# Patient Record
Sex: Male | Born: 1940 | Race: White | Hispanic: No | Smoking: Current every day smoker
Health system: Southern US, Community
[De-identification: ages and names within clinical notes are randomized; demographics above are authoritative.]

## PROBLEM LIST (undated history)

## (undated) ENCOUNTER — Emergency Department: Discharge status: Absent without leave | Payer: Medicare Other

## (undated) DIAGNOSIS — Z9109 Other allergy status, other than to drugs and biological substances: Secondary | ICD-10-CM

## (undated) DIAGNOSIS — F32A Depression, unspecified: Secondary | ICD-10-CM

## (undated) DIAGNOSIS — F329 Major depressive disorder, single episode, unspecified: Secondary | ICD-10-CM

## (undated) DIAGNOSIS — M549 Dorsalgia, unspecified: Secondary | ICD-10-CM

## (undated) DIAGNOSIS — I509 Heart failure, unspecified: Secondary | ICD-10-CM

## (undated) DIAGNOSIS — Z87891 Personal history of nicotine dependence: Secondary | ICD-10-CM

## (undated) DIAGNOSIS — F4310 Post-traumatic stress disorder, unspecified: Secondary | ICD-10-CM

## (undated) DIAGNOSIS — J439 Emphysema, unspecified: Secondary | ICD-10-CM

## (undated) DIAGNOSIS — I2699 Other pulmonary embolism without acute cor pulmonale: Secondary | ICD-10-CM

## (undated) DIAGNOSIS — I10 Essential (primary) hypertension: Secondary | ICD-10-CM

## (undated) DIAGNOSIS — F0390 Unspecified dementia without behavioral disturbance: Secondary | ICD-10-CM

## (undated) DIAGNOSIS — G8929 Other chronic pain: Secondary | ICD-10-CM

## (undated) HISTORY — DX: Personal history of nicotine dependence: Z87.891

## (undated) HISTORY — DX: Depression, unspecified: F32.A

## (undated) HISTORY — DX: Emphysema, unspecified: J43.9

## (undated) HISTORY — DX: Other allergy status, other than to drugs and biological substances: Z91.09

## (undated) HISTORY — DX: Major depressive disorder, single episode, unspecified: F32.9

## (undated) HISTORY — DX: Post-traumatic stress disorder, unspecified: F43.10

## (undated) HISTORY — PX: BACK SURGERY: SHX140

---

## 1978-12-12 HISTORY — PX: TONSILLECTOMY AND ADENOIDECTOMY: SHX28

## 2012-04-10 ENCOUNTER — Emergency Department: Payer: Self-pay | Admitting: Emergency Medicine

## 2012-04-10 LAB — CBC
HGB: 16.3 g/dL (ref 13.0–18.0)
MCH: 30.5 pg (ref 26.0–34.0)
MCHC: 33.8 g/dL (ref 32.0–36.0)
MCV: 90 fL (ref 80–100)
Platelet: 158 10*3/uL (ref 150–440)
RBC: 5.34 10*6/uL (ref 4.40–5.90)
WBC: 8.5 10*3/uL (ref 3.8–10.6)

## 2012-04-10 LAB — COMPREHENSIVE METABOLIC PANEL
Alkaline Phosphatase: 78 U/L (ref 50–136)
Bilirubin,Total: 0.7 mg/dL (ref 0.2–1.0)
Calcium, Total: 8.7 mg/dL (ref 8.5–10.1)
Chloride: 102 mmol/L (ref 98–107)
Co2: 24 mmol/L (ref 21–32)
EGFR (African American): >60
Osmolality: 271 (ref 275–301)
Potassium: 3.7 mmol/L (ref 3.5–5.1)
SGPT (ALT): 16 U/L
Sodium: 136 mmol/L (ref 136–145)

## 2012-04-10 LAB — TROPONIN I: Troponin-I: <0.02 ng/mL

## 2015-08-06 ENCOUNTER — Other Ambulatory Visit: Payer: Self-pay | Admitting: Family Medicine

## 2015-08-06 ENCOUNTER — Encounter: Payer: Self-pay | Admitting: Family Medicine

## 2015-08-06 DIAGNOSIS — Z87891 Personal history of nicotine dependence: Secondary | ICD-10-CM | POA: Insufficient documentation

## 2015-08-06 HISTORY — DX: Personal history of nicotine dependence: Z87.891

## 2015-08-07 ENCOUNTER — Inpatient Hospital Stay: Payer: Medicare Other | Attending: Family Medicine | Admitting: Family Medicine

## 2015-08-07 ENCOUNTER — Ambulatory Visit
Admission: RE | Admit: 2015-08-07 | Discharge: 2015-08-07 | Disposition: A | Discharge status: Home / Self Care | Payer: Medicare Other | Source: Ambulatory Visit | Attending: Family Medicine | Admitting: Family Medicine

## 2015-08-07 ENCOUNTER — Encounter: Payer: Self-pay | Admitting: Family Medicine

## 2015-08-07 DIAGNOSIS — F17210 Nicotine dependence, cigarettes, uncomplicated: Secondary | ICD-10-CM

## 2015-08-07 DIAGNOSIS — K760 Fatty (change of) liver, not elsewhere classified: Secondary | ICD-10-CM | POA: Insufficient documentation

## 2015-08-07 DIAGNOSIS — J439 Emphysema, unspecified: Secondary | ICD-10-CM | POA: Insufficient documentation

## 2015-08-07 DIAGNOSIS — Z87891 Personal history of nicotine dependence: Secondary | ICD-10-CM

## 2015-08-07 DIAGNOSIS — I2510 Atherosclerotic heart disease of native coronary artery without angina pectoris: Secondary | ICD-10-CM | POA: Insufficient documentation

## 2015-08-07 DIAGNOSIS — Z122 Encounter for screening for malignant neoplasm of respiratory organs: Secondary | ICD-10-CM

## 2015-08-07 NOTE — Progress Notes (Signed)
In accordance with CMS guidelines, patient has meet eligibility criteria including age, absence of signs or symptoms of lung cancer, the specific calculation of cigarette smoking pack-years was 55 years and is a current smoker.   A shared decision-making session was conducted prior to the performance of CT scan. This includes one or more decision aids, includes benefits and harms of screening, follow-up diagnostic testing, over-diagnosis, false positive rate, and total radiation exposure.  Counseling on the importance of adherence to annual lung cancer LDCT screening, impact of co-morbidities, and ability or willingness to undergo diagnosis and treatment is imperative for compliance of the program.  Counseling on the importance of continued smoking cessation for former smokers; the importance of smoking cessation for current smokers and information about tobacco cessation interventions have been given to patient including the [place] [facility], [address] quit [place], as well as Cancer Center specific smoking cessation programs.  Written order for lung cancer screening with LDCT has been given to the patient and any and all questions have been answered to the best of my abilities.   Yearly follow up will be scheduled by Shawn Perkins, Thoracic Navigator.   

## 2015-08-10 ENCOUNTER — Telehealth: Payer: Self-pay | Admitting: *Deleted

## 2015-08-10 NOTE — Telephone Encounter (Signed)
  Oncology Nurse Navigator Documentation    Navigator Encounter Type: Telephone;Screening (08/10/15 1100)                      Time Spent with Patient: 15 (08/10/15 1100)   Notified patient of LDCT lung cancer screening results of Lung Rads 2S finding with recommendation for 12 month follow up imaging. Also notified of incidental finding noted below. Patient verbalizes understanding. Copy of results will be sent to the referring provider as well as patient's primary care provider.  1. Lung-RADS Category 2S, benign appearance or behavior. Continue annual screening with low-dose chest CT without contrast in 12 months. 2. The "S" modifier above refers to potentially clinically significant non lung cancer related findings. Specifically, there is extensive atherosclerosis, including three-vessel coronary artery disease. Assessment for potential risk factor modification, dietary therapy or pharmacologic therapy may be warranted, if clinically indicated. 3. Mild diffuse bronchial wall thickening with moderate centrilobular and mild paraseptal emphysema; imaging findings suggestive of underlying COPD. 4. Mild hepatic steatosis.

## 2016-04-01 ENCOUNTER — Encounter: Payer: Self-pay | Admitting: Primary Care

## 2016-04-01 ENCOUNTER — Ambulatory Visit (INDEPENDENT_AMBULATORY_CARE_PROVIDER_SITE_OTHER): Payer: Medicare Other | Admitting: Primary Care

## 2016-04-01 VITALS — BP 134/84 | HR 65 | Temp 97.3°F | Ht 69.5 in | Wt 217.1 lb

## 2016-04-01 DIAGNOSIS — G2581 Restless legs syndrome: Secondary | ICD-10-CM

## 2016-04-01 DIAGNOSIS — N400 Enlarged prostate without lower urinary tract symptoms: Secondary | ICD-10-CM | POA: Diagnosis not present

## 2016-04-01 DIAGNOSIS — J449 Chronic obstructive pulmonary disease, unspecified: Secondary | ICD-10-CM

## 2016-04-01 MED ORDER — PRAMIPEXOLE DIHYDROCHLORIDE 1 MG PO TABS: 1.0000 mg | ORAL_TABLET | Freq: Every day | ORAL | Status: DC

## 2016-04-01 NOTE — Patient Instructions (Signed)
I've sent refills of your Mirapex to your pharmacy.  Ensure you are taking your Advair inhaler once everyday. This will help with your shortness of breath.   Please schedule a physical with me within the next 3 months. You may also schedule a lab only appointment 3-4 days prior. We will discuss your lab results in detail during your physical.  It was a pleasure to meet you today! Please don't hesitate to call me with any questions. Welcome to [facility]!

## 2016-04-01 NOTE — Progress Notes (Signed)
Subjective:    Patient ID: Ricardo Erickson, male    DOB: 1941/09/12, 75 y.o.   MRN: [id number]  HPI  Ricardo Erickson is a 75 year old male who presents today to establish care and discuss the problems mentioned below. Will obtain old records. His last physical was several years ago. Last had blood work was 5 months ago by prior PCP.   1) Restless Leg Syndrome: Diagnosed in childhood. Currently managed on Mirapex and feels well managed on this medication. He is requesting refills today as he is nearly out.   2) BPH: Long history of and is currently managed by Urology at [facility]. Currently managed on Avodart, Cialis, and Flomax. History of elevated PSA by prior Urologist in [place]. History of prostate cancer in his brother.   3) COPD: Current smoker and has smoked since age 51. He currently smokes 1.5 PPD. He completed a low dose CT of his chest in August 2016 which was unremarkable. Currently managed on Advair 250-50 and albuterol PRN. He is currently managed by pulmonology but does not wish to follow any longer. He is non compliant to his Advair as he's using it as needed. He does experience daily exertional shortness of breath.  Review of Systems  Constitutional: Negative for unexpected weight change.  Respiratory:       Morning cough. Chronic SOB upon exertion.   Cardiovascular: Negative for chest pain.  Genitourinary: Negative for difficulty urinating.  Neurological: Negative for headaches.       Occasional dizziness with rapid changes in positions       Past Medical History  Diagnosis Date  . Personal history of tobacco use, presenting hazards to health 08/06/2015  . Pollen allergies   . Depression   . Emphysema lung ([place])      Social History   Social History  . Marital Status: Widowed    Spouse Name: N/A  . Number of Children: N/A  . Years of Education: N/A   Occupational History  . Not on file.   Social History Main Topics  . Smoking status: Current  Every Day Smoker  . Smokeless tobacco: Not on file  . Alcohol Use: Not on file  . Drug Use: Not on file  . Sexual Activity: Not on file   Other Topics Concern  . Not on file   Social History Narrative   Widower.   2 children, 3 grandchildren, 1 great grandchild.   Retired. Now works as a Gaffer.   Enjoys relaxing, spending time with family, watching basketball.       Past Surgical History  Procedure Laterality Date  . Tonsillectomy and adenoidectomy  1980    Family History  Problem Relation Age of Onset  . Alcohol abuse Father   . Arthritis Mother   . Heart disease Maternal Grandmother   . Prostate cancer Brother     No Known Allergies  No current outpatient prescriptions on file prior to visit.   No current facility-administered medications on file prior to visit.    BP 134/84 mmHg  Pulse 65  Temp(Src) 97.3 F (36.3 C) (Oral)  Ht 5' 9.5" (1.765 m)  Wt 217 lb 1.9 oz (98.485 kg)  BMI 31.61 kg/m2  SpO2 93%    Objective:   Physical Exam  Constitutional: He is oriented to person, place, and time. He appears well-nourished.  Neck: Neck supple.  Cardiovascular: Normal rate and regular rhythm.   Pulmonary/Chest: Effort normal and breath sounds normal. He has no wheezes.  He has no rales.  Neurological: He is alert and oriented to person, place, and time.  Skin: Skin is warm and dry.  Psychiatric: He has a normal mood and affect.          Assessment & Plan:

## 2016-04-03 DIAGNOSIS — J449 Chronic obstructive pulmonary disease, unspecified: Secondary | ICD-10-CM | POA: Insufficient documentation

## 2016-04-03 DIAGNOSIS — G2581 Restless legs syndrome: Secondary | ICD-10-CM | POA: Insufficient documentation

## 2016-04-03 DIAGNOSIS — N400 Enlarged prostate without lower urinary tract symptoms: Secondary | ICD-10-CM | POA: Insufficient documentation

## 2016-04-03 NOTE — Assessment & Plan Note (Signed)
Long history of tobacco abuse. Low dose CT scan with findings of small nodules and COPD in August 2016. Repeat in August 2017. Discussed importance of daily use of Advair and PRN use of albuterol. Will continue to monitor.

## 2016-04-03 NOTE — Assessment & Plan Note (Signed)
Long history of since childhood. Refill provided on Mirapex for which he takes once daily.

## 2016-04-03 NOTE — Assessment & Plan Note (Signed)
Managed by Urology in [place]. History of elevated PSA which is monitored.  Continue Cialis, Avodart, Flomax.

## 2016-04-12 ENCOUNTER — Other Ambulatory Visit: Payer: Self-pay | Admitting: Primary Care

## 2016-04-12 DIAGNOSIS — Z13220 Encounter for screening for lipoid disorders: Secondary | ICD-10-CM

## 2016-04-12 DIAGNOSIS — Z131 Encounter for screening for diabetes mellitus: Secondary | ICD-10-CM

## 2016-04-12 DIAGNOSIS — N400 Enlarged prostate without lower urinary tract symptoms: Secondary | ICD-10-CM

## 2016-04-13 ENCOUNTER — Other Ambulatory Visit (INDEPENDENT_AMBULATORY_CARE_PROVIDER_SITE_OTHER): Payer: Medicare Other

## 2016-04-13 ENCOUNTER — Ambulatory Visit (INDEPENDENT_AMBULATORY_CARE_PROVIDER_SITE_OTHER): Payer: Medicare Other

## 2016-04-13 VITALS — BP 128/76 | HR 61 | Temp 97.5°F | Ht 69.25 in | Wt 215.0 lb

## 2016-04-13 DIAGNOSIS — Z0000 Encounter for general adult medical examination without abnormal findings: Secondary | ICD-10-CM

## 2016-04-13 DIAGNOSIS — Z125 Encounter for screening for malignant neoplasm of prostate: Secondary | ICD-10-CM

## 2016-04-13 DIAGNOSIS — N400 Enlarged prostate without lower urinary tract symptoms: Secondary | ICD-10-CM | POA: Diagnosis not present

## 2016-04-13 DIAGNOSIS — Z13220 Encounter for screening for lipoid disorders: Secondary | ICD-10-CM

## 2016-04-13 DIAGNOSIS — J449 Chronic obstructive pulmonary disease, unspecified: Secondary | ICD-10-CM

## 2016-04-13 DIAGNOSIS — Z131 Encounter for screening for diabetes mellitus: Secondary | ICD-10-CM

## 2016-04-13 LAB — LIPID PANEL
CHOLESTEROL: 188 mg/dL (ref 0–200)
HDL: 41.5 mg/dL (ref >39.00)
LDL Cholesterol: 130 mg/dL — ABNORMAL HIGH (ref 0–99)
NonHDL: 146.99
TRIGLYCERIDES: 83 mg/dL (ref 0.0–149.0)
Total CHOL/HDL Ratio: 5
VLDL: 16.6 mg/dL (ref 0.0–40.0)

## 2016-04-13 LAB — COMPREHENSIVE METABOLIC PANEL
ALBUMIN: 4.1 g/dL (ref 3.5–5.2)
ALK PHOS: 90 U/L (ref 39–117)
ALT: 9 U/L (ref 0–53)
AST: 19 U/L (ref 0–37)
BILIRUBIN TOTAL: 0.8 mg/dL (ref 0.2–1.2)
BUN: 13 mg/dL (ref 6–23)
CALCIUM: 9.2 mg/dL (ref 8.4–10.5)
CO2: 26 mEq/L (ref 19–32)
Chloride: 103 mEq/L (ref 96–112)
Creatinine, Ser: 0.95 mg/dL (ref 0.40–1.50)
GFR: 82.58 mL/min (ref >60.00)
Glucose, Bld: 89 mg/dL (ref 70–99)
Potassium: 4.3 mEq/L (ref 3.5–5.1)
Sodium: 140 mEq/L (ref 135–145)
TOTAL PROTEIN: 6.4 g/dL (ref 6.0–8.3)

## 2016-04-13 LAB — CBC WITH DIFFERENTIAL/PLATELET
BASOS ABS: 0.1 10*3/uL (ref 0.0–0.1)
Basophils Relative: 0.4 % (ref 0.0–3.0)
Eosinophils Absolute: 0.2 10*3/uL (ref 0.0–0.7)
Eosinophils Relative: 1.4 % (ref 0.0–5.0)
HEMATOCRIT: 52.7 % — AB (ref 39.0–52.0)
Hemoglobin: 17.6 g/dL — ABNORMAL HIGH (ref 13.0–17.0)
LYMPHS PCT: 19.4 % (ref 12.0–46.0)
Lymphs Abs: 2.5 10*3/uL (ref 0.7–4.0)
MCHC: 33.4 g/dL (ref 30.0–36.0)
MCV: 89.2 fl (ref 78.0–100.0)
MONOS PCT: 10.4 % (ref 3.0–12.0)
Monocytes Absolute: 1.3 10*3/uL — ABNORMAL HIGH (ref 0.1–1.0)
NEUTROS ABS: 8.8 10*3/uL — AB (ref 1.4–7.7)
Neutrophils Relative %: 68.4 % (ref 43.0–77.0)
PLATELETS: 233 10*3/uL (ref 150.0–400.0)
RBC: 5.91 Mil/uL — AB (ref 4.22–5.81)
RDW: 14.9 % (ref 11.5–15.5)
WBC: 12.9 10*3/uL — ABNORMAL HIGH (ref 4.0–10.5)

## 2016-04-13 LAB — HEMOGLOBIN A1C: HEMOGLOBIN A1C: 6.1 % (ref 4.6–6.5)

## 2016-04-13 LAB — PSA, MEDICARE: PSA: 11.99 ng/ml — ABNORMAL HIGH (ref 0.10–4.00)

## 2016-04-13 NOTE — Progress Notes (Signed)
I reviewed health advisor's note, was available for consultation, and agree with documentation and plan.  

## 2016-04-13 NOTE — Patient Instructions (Addendum)
Ricardo Erickson,   Per our conversation, please do the following:  A. Bring in copy of immunization record to next appt.   B. Contact insurance to determine coverage for both shingles and tetanus vaccines.   C. Bring a copy of advance directives to next appt. We can make a copy here, if necessary.   Thank you for taking time to come for your Medicare Wellness Visit. I appreciate your ongoing commitment to your health goals. Please review the following plan we discussed and let me know if I can assist you in the future.   These are the goals we discussed: Goals    . Weight < 141 lb (63.957 kg)     Target weight is 140 lbs. Starting 04/13/2016, I will continue to reduce intake of simple carbohydrates to no more than 1 serving daily.        This is a list of the screening recommended for you and due dates:  Health Maintenance  Topic Date Due  . Pneumonia vaccines (1 of 2 - PCV13) 09/11/2016*  . Colon Cancer Screening  04/13/2017*  . Shingles Vaccine  04/13/2017*  . Tetanus Vaccine  04/13/2017*  . Flu Shot  07/12/2016  *Topic was postponed. The date shown is not the original due date.    Preventive Care for Adults  A healthy lifestyle and preventive care can promote health and wellness. Preventive health guidelines for adults include the following key practices.  . A routine yearly physical is a good way to check with your health care provider about your health and preventive screening. It is a chance to share any concerns and updates on your health and to receive a thorough exam.  . Visit your dentist for a routine exam and preventive care every 6 months. Brush your teeth twice a day and floss once a day. Good oral hygiene prevents tooth decay and gum disease.  . The frequency of eye exams is based on your age, health, family medical history, use  of contact lenses, and other factors. Follow your health care provider's ecommendations for frequency of eye exams.  . Eat a healthy diet.  Foods like vegetables, fruits, whole grains, low-fat dairy products, and lean protein foods contain the nutrients you need without too many calories. Decrease your intake of foods high in solid fats, added sugars, and salt. Eat the right amount of calories for you. Get information about a proper diet from your health care provider, if necessary.  . Regular physical exercise is one of the most important things you can do for your health. Most adults should get at least 150 minutes of moderate-intensity exercise (any activity that increases your heart rate and causes you to sweat) each week. In addition, most adults need muscle-strengthening exercises on 2 or more days a week.  Silver Sneakers may be a benefit available to you. To determine eligibility, you may visit the website: www.silversneakers.com or contact program at (617)560-7549 Mon-Fri between 8AM-8PM.   . Maintain a healthy weight. The body mass index (BMI) is a screening tool to identify possible weight problems. It provides an estimate of body fat based on height and weight. Your health care provider can find your BMI and can help you achieve or maintain a healthy weight.   For adults 20 years and older: ? A BMI below 18.5 is considered underweight. ? A BMI of 18.5 to 24.9 is normal. ? A BMI of 25 to 29.9 is considered overweight. ? A BMI of 30 and above  is considered obese.   . Maintain normal blood lipids and cholesterol levels by exercising and minimizing your intake of saturated fat. Eat a balanced diet with plenty of fruit and vegetables. Blood tests for lipids and cholesterol should begin at age 32 and be repeated every 5 years. If your lipid or cholesterol levels are high, you are over 50, or you are at high risk for heart disease, you may need your cholesterol levels checked more frequently. Ongoing high lipid and cholesterol levels should be treated with medicines if diet and exercise are not working.  . If you smoke, find out  from your health care provider how to quit. If you do not use tobacco, please do not start.  . If you choose to drink alcohol, please do not consume more than 2 drinks per day. One drink is considered to be 12 ounces (355 mL) of beer, 5 ounces (148 mL) of wine, or 1.5 ounces (44 mL) of liquor.  . If you are 101-32 years old, ask your health care provider if you should take aspirin to prevent strokes.  . Use sunscreen. Apply sunscreen liberally and repeatedly throughout the day. You should seek shade when your shadow is shorter than you. Protect yourself by wearing long sleeves, pants, a wide-brimmed hat, and sunglasses year round, whenever you are outdoors.  . Once a month, do a whole body skin exam, using a mirror to look at the skin on your back. Tell your health care provider of new moles, moles that have irregular borders, moles that are larger than a pencil eraser, or moles that have changed in shape or color.

## 2016-04-13 NOTE — Progress Notes (Signed)
Pre visit review using our clinic review tool, if applicable. No additional management support is needed unless otherwise documented below in the visit note. 

## 2016-04-13 NOTE — Progress Notes (Signed)
Subjective:   Ricardo Erickson is a 75 y.o. male who presents for an Initial Medicare Annual Wellness Visit.  Review of Systems  N/A Cardiac Risk Factors include: advanced age (>72men, >68 women);obesity (BMI >30kg/m2);male gender    Objective:    Today's Vitals   04/13/16 1021  BP: 128/76  Pulse: 61  Temp: 97.5 F (36.4 C)  TempSrc: Oral  Height: 5' 9.25" (1.759 m)  Weight: 215 lb (97.523 kg)  SpO2: 95%  PainSc: 0-No pain   Body mass index is 31.52 kg/(m^2).  Current Medications (verified) Outpatient Encounter Prescriptions as of 04/13/2016  Medication Sig  . albuterol (PROVENTIL) (2.5 MG/3ML) 0.083% nebulizer solution Inhale 2.5 mg into the lungs every 6 (six) hours as needed.   Marland Kitchen aspirin EC 81 MG tablet Take 81 mg by mouth daily. Reported on 04/01/2016  . CIALIS 5 MG tablet Take 5 mg by mouth daily. Reported on 04/01/2016  . Fluticasone-Salmeterol (ADVAIR DISKUS) 250-50 MCG/DOSE AEPB Inhale 1 puff into the lungs daily. Reported on 04/01/2016  . pramipexole (MIRAPEX) 1 MG tablet Take 1 tablet (1 mg total) by mouth daily.  . [DISCONTINUED] dutasteride (AVODART) 0.5 MG capsule Take 0.5 mg by mouth daily.   . [DISCONTINUED] tamsulosin (FLOMAX) 0.4 MG CAPS capsule Take 0.4 mg by mouth. Reported on 04/01/2016   No facility-administered encounter medications on file as of 04/13/2016.    Allergies (verified) Review of patient's allergies indicates no known allergies.   History: Past Medical History  Diagnosis Date  . Personal history of tobacco use, presenting hazards to health 08/06/2015  . Pollen allergies   . Depression   . Emphysema lung [facility])    Past Surgical History  Procedure Laterality Date  . Tonsillectomy and adenoidectomy  1980   Family History  Problem Relation Age of Onset  . Alcohol abuse Father   . Arthritis Mother   . Heart disease Maternal Grandmother   . Prostate cancer Brother    Social History   Occupational History  . Not on file.   Social  History Main Topics  . Smoking status: Current Every Day Smoker  . Smokeless tobacco: Not on file  . Alcohol Use: No  . Drug Use: Not on file  . Sexual Activity: Not Currently   Tobacco Counseling Ready to quit: No Counseling given: No   Activities of Daily Living In your present state of health, do you have any difficulty performing the following activities: 04/13/2016  Hearing? Y  Vision? N  Difficulty concentrating or making decisions? N  Walking or climbing stairs? N  Dressing or bathing? N  Doing errands, shopping? N  Preparing Food and eating ? N  Using the Toilet? N  In the past six months, have you accidently leaked urine? Y  Do you have problems with loss of bowel control? N  Managing your Medications? N  Managing your Finances? N  Housekeeping or managing your Housekeeping? N    Immunizations and Health Maintenance  There is no immunization history on file for this patient. There are no preventive care reminders to display for this patient.  Patient Care Team: Pleas Koch, NP as PCP - General (Internal Medicine)     Assessment:   This is a routine wellness examination for Ricardo Erickson.   Hearing/Vision screen  Hearing Screening   125Hz  250Hz  500Hz  1000Hz  2000Hz  4000Hz  8000Hz   Right ear:   0 0 40 40   Left ear:   0 0 40 0  Dietary issues and exercise activities discussed: Current Exercise Habits: The patient does not participate in regular exercise at present (pt is currenlty running a Architect business), Exercise limited by: None identified  Goals    . Weight < 141 lb (63.957 kg)     Target weight is 140 lbs. Starting 04/13/2016, I will continue to reduce intake of simple carbohydrates to no more than 1 serving daily.       Depression Screen PHQ 2/9 Scores 04/13/2016  PHQ - 2 Score 0    Fall Risk Fall Risk  04/13/2016  Falls in the past year? No    Cognitive Function: MMSE - Mini Mental State Exam 04/13/2016  Orientation to time 5    Orientation to Place 5  Registration 3  Attention/ Calculation 0  Recall 3  Language- name 2 objects 0  Language- repeat 1  Language- follow 3 step command 3  Language- read & follow direction 0  Write a sentence 0  Copy design 0  Total score 20   PLEASE NOTE: A Mini-Cog screen was completed. Maximum score is 20. A value of 0 denotes this part of [place] MMSE was not completed.  Orientation to Time - Max 5 Orientation to Place - Max 5 Registration - Max 3 Recall - Max 3 Language Repeat - Max 1 Language Follow 3 Step Command - Max 3   Screening Tests Health Maintenance  Topic Date Due  . PNA vac Low Risk Adult (1 of 2 - PCV13) 09/11/2016 (Originally 03/11/2008)  . COLONOSCOPY  04/13/2017 (Originally 03/11/1993)  . ZOSTAVAX  04/13/2017 (Originally 03/12/2003)  . TETANUS/TDAP  04/13/2017 (Originally 03/11/1962)  . INFLUENZA VACCINE  07/12/2016        Plan:     I have personally reviewed and addressed the Medicare Annual Wellness questionnaire and have noted the following in the patient's chart:  A. Medical and social history B. Use of alcohol, tobacco or illicit drugs  C. Current medications and supplements D. Functional ability and status E.  Nutritional status F.  Physical activity G. Advance directives H. List of other physicians I.  Hospitalizations, surgeries, and ER visits in previous 12 months J.  [place] to include hearing, vision, cognitive, depression L. Referrals and appointments - none  In addition, I have reviewed and discussed with patient certain preventive protocols, quality metrics, and best practice recommendations. A written personalized care plan for preventive services as well as general preventive health recommendations were provided to patient.  See attached scanned questionnaire for additional information.   Signed,   Lindell Noe, MHA, BS, LPN Health Advisor [id number]

## 2016-04-15 ENCOUNTER — Ambulatory Visit (INDEPENDENT_AMBULATORY_CARE_PROVIDER_SITE_OTHER): Payer: Medicare Other | Admitting: Primary Care

## 2016-04-15 ENCOUNTER — Encounter: Payer: Self-pay | Admitting: Primary Care

## 2016-04-15 VITALS — BP 144/80 | HR 63 | Temp 97.5°F | Ht 69.5 in | Wt 220.0 lb

## 2016-04-15 DIAGNOSIS — R300 Dysuria: Secondary | ICD-10-CM

## 2016-04-15 DIAGNOSIS — Z0000 Encounter for general adult medical examination without abnormal findings: Secondary | ICD-10-CM | POA: Diagnosis not present

## 2016-04-15 DIAGNOSIS — N400 Enlarged prostate without lower urinary tract symptoms: Secondary | ICD-10-CM

## 2016-04-15 DIAGNOSIS — Z23 Encounter for immunization: Secondary | ICD-10-CM

## 2016-04-15 LAB — POC URINALSYSI DIPSTICK (AUTOMATED)
Bilirubin, UA: NEGATIVE
GLUCOSE UA: NEGATIVE
Ketones, UA: NEGATIVE
Leukocytes, UA: NEGATIVE
NITRITE UA: NEGATIVE
PROTEIN UA: NEGATIVE
RBC UA: NEGATIVE
UROBILINOGEN UA: NEGATIVE
pH, UA: 5.5

## 2016-04-15 MED ORDER — ZOSTER VACCINE LIVE 19400 UNT/0.65ML ~~LOC~~ SUSR: 0.6500 mL | Freq: Once | SUBCUTANEOUS | Status: DC

## 2016-04-15 NOTE — Progress Notes (Signed)
Subjective:    Patient ID: Ricardo Erickson, male    DOB: 02/18/1941, 75 y.o.   MRN: [id number]  HPI  Ricardo Erickson is a 75 year old male who presents today for complete physical.  Immunizations: -Tetanus: Completed 2 years ago -Influenza: Did complete last season. -Pneumonia: He endorses 1 vaccination in the past. -Shingles: Never completed.   Diet: He endorse a poor diet. Breakfast: Fast food (sausage biscuit) Lunch: Fast food, restaurants, BBQ, salads Dinner: Fast food, restaurants Snacks: Candy bars, ice cream Desserts: Everyday Beverages: Sodas, gatorade, limited water.  Exercise: He does not currently exercise.  Eye exam: Completed 3-4 years ago, has noticed changes in vision.  Dental exam: False teeth. Colonoscopy: Due. Never completed in the past. Interested in [employer].  1) Dysuria: Present for the past 2 days. He does have a history of BPH and currently with a PSA of 11.99. Also reports increased urinary frequency, urgency, and difficulty urinating. Denies fevers, weakness, fatigue.  Review of Systems  Constitutional: Negative for unexpected weight change.  HENT: Negative for rhinorrhea.   Respiratory: Negative for cough.        Daily shortness of breath, managed on Advair and albuterol.  Cardiovascular: Negative for chest pain.  Genitourinary: Positive for dysuria, urgency and difficulty urinating. Negative for frequency and discharge.       Managed by Urology in [facility].   Musculoskeletal: Positive for arthralgias.  Skin: Negative for rash.  Neurological: Negative for dizziness, numbness and headaches.  Psychiatric/Behavioral:       Denies concerns for anxiety and depression       Past Medical History  Diagnosis Date  . Personal history of tobacco use, presenting hazards to health 08/06/2015  . Pollen allergies   . Depression   . Emphysema lung ([place])      Social History   Social History  . Marital Status: Widowed    Spouse Name: N/A  . Number  of Children: N/A  . Years of Education: N/A   Occupational History  . Not on file.   Social History Main Topics  . Smoking status: Current Every Day Smoker  . Smokeless tobacco: Not on file  . Alcohol Use: No  . Drug Use: Not on file  . Sexual Activity: Not Currently   Other Topics Concern  . Not on file   Social History Narrative   Widower.   2 children, 3 grandchildren, 1 great grandchild.   Retired. Now works as a Gaffer.   Enjoys relaxing, spending time with family, watching basketball.       Past Surgical History  Procedure Laterality Date  . Tonsillectomy and adenoidectomy  1980    Family History  Problem Relation Age of Onset  . Alcohol abuse Father   . Arthritis Mother   . Heart disease Maternal Grandmother   . Prostate cancer Brother     No Known Allergies  Current Outpatient Prescriptions on File Prior to Visit  Medication Sig Dispense Refill  . albuterol (PROVENTIL) (2.5 MG/3ML) 0.083% nebulizer solution Inhale 2.5 mg into the lungs every 6 (six) hours as needed.     Marland Kitchen aspirin EC 81 MG tablet Take 81 mg by mouth daily. Reported on 04/01/2016    . CIALIS 5 MG tablet Take 5 mg by mouth daily. Reported on 04/01/2016  0  . Fluticasone-Salmeterol (ADVAIR DISKUS) 250-50 MCG/DOSE AEPB Inhale 1 puff into the lungs daily. Reported on 04/01/2016    . pramipexole (MIRAPEX) 1 MG tablet Take 1 tablet (  1 mg total) by mouth daily. 90 tablet 3   No current facility-administered medications on file prior to visit.    BP 144/80 mmHg  Pulse 63  Temp(Src) 97.5 F (36.4 C) (Oral)  Ht 5' 9.5" (1.765 m)  Wt 220 lb (99.791 kg)  BMI 32.03 kg/m2  SpO2 96%    Objective:   Physical Exam  Constitutional: He is oriented to person, place, and time. He appears well-nourished.  HENT:  Right Ear: Tympanic membrane and ear canal normal.  Left Ear: Tympanic membrane and ear canal normal.  Nose: Nose normal. Right sinus exhibits no maxillary sinus tenderness and no frontal  sinus tenderness. Left sinus exhibits no maxillary sinus tenderness and no frontal sinus tenderness.  Mouth/Throat: Oropharynx is clear and moist.  Eyes: Conjunctivae and EOM are normal. Pupils are equal, round, and reactive to light.  Neck: Neck supple. Carotid bruit is not present. No thyromegaly present.  Cardiovascular: Normal rate, regular rhythm and normal heart sounds.   Pulmonary/Chest: Effort normal and breath sounds normal. He has no wheezes. He has no rales.  Abdominal: Soft. Bowel sounds are normal. There is no tenderness.  Musculoskeletal: Normal range of motion.  Neurological: He is alert and oriented to person, place, and time. He has normal reflexes. No cranial nerve deficit.  Skin: Skin is warm and dry.  Psychiatric: He has a normal mood and affect.          Assessment & Plan:  Dysuria:  Present for 2 days, increase in frequency. No fevers or changes in urinary pattern otherwise. PSA level gradually increased from 6 to 11.9. Appointment with Urologist is in 2 weeks. UA: Negative for leuks, blood, nitrites. Increase consumption of water. Follow up PRN.

## 2016-04-15 NOTE — Addendum Note (Signed)
Addended by: Jacqualin Combes on: 04/15/2016 01:12 PM   Modules accepted: Orders

## 2016-04-15 NOTE — Patient Instructions (Signed)
You are prediabetic and your cholesterol is borderline high. You must improve your diet.  Reduce: Fast foods, fried foods, ice cream, candy bars as this can all cause an increase in your blood sugars.  Increase: Vegetables, fruits, lean protein, whole grains, water.  Ensure you are consuming 64 ounces of water daily.  Start exercising. You should be getting 1 hour of exercise 5 days weekly.  Continue daily aspirin 81 mg everyday.  Your prostate levels are high at 11.99. Please discuss this with your urologist next week.  Follow up in 6 months for re-evaluation.   It was a pleasure to see you today!

## 2016-04-15 NOTE — Assessment & Plan Note (Addendum)
He will call his insurance regarding Td, pneumonia, and shingles vaccination. Poor diet. Stressed importance of reducing fast foods and fried foods. Increase vegetables, fruits, whole grains, water. Start exercising. Labs with prediabetes and elevated PSA. Exam unremarkable. UA unremarkable.  Follow up in 1 year for repeat physical.

## 2016-04-15 NOTE — Progress Notes (Signed)
Pre visit review using our clinic review tool, if applicable. No additional management support is needed unless otherwise documented below in the visit note. 

## 2016-04-15 NOTE — Assessment & Plan Note (Signed)
Recent PSA of 11.99, managed by Urology in [place] and is due for re-evaluation next week. Lab printed and provided to patient for him to take to next appointment.

## 2016-04-18 ENCOUNTER — Ambulatory Visit: Payer: Medicare Other

## 2016-07-21 ENCOUNTER — Telehealth: Payer: Self-pay | Admitting: Primary Care

## 2016-07-21 DIAGNOSIS — R972 Elevated prostate specific antigen [PSA]: Secondary | ICD-10-CM

## 2016-07-21 NOTE — Telephone Encounter (Signed)
Spoken to patient requesting his request for labs. Patient would like to have PSA, CBC, and CMP. Patient was told by urology that he may ask his local provider to order these lab for him and patient can bring a copy of them to urology. It will save a patient a trip to [facility].  Notified Anda Kraft of patient's responses.   Lab appt on 07/25/2016.

## 2016-07-21 NOTE — Telephone Encounter (Signed)
Noted. Lab orders placed.

## 2016-07-21 NOTE — Telephone Encounter (Signed)
Did urology order these labs? Typically if urology wants labs they order their own. Is he wanting to know if he can get his blood drawn in our clinic? Please clarify his request, may need to call his urology office to see what they're wanting and if they have are ordered any labs.

## 2016-07-21 NOTE — Telephone Encounter (Signed)
Pt called - his urologist wants him to do a complete panel and pt is wanting to know if he can schedule a lab visit for it or if he needs to see you.  He is also wanting a psa  cb number is [phone]

## 2016-07-25 ENCOUNTER — Other Ambulatory Visit (INDEPENDENT_AMBULATORY_CARE_PROVIDER_SITE_OTHER): Payer: Medicare Other

## 2016-07-25 DIAGNOSIS — R972 Elevated prostate specific antigen [PSA]: Secondary | ICD-10-CM | POA: Diagnosis not present

## 2016-07-25 LAB — COMPREHENSIVE METABOLIC PANEL
ALK PHOS: 92 U/L (ref 39–117)
ALT: 9 U/L (ref 0–53)
AST: 20 U/L (ref 0–37)
Albumin: 4.1 g/dL (ref 3.5–5.2)
BUN: 13 mg/dL (ref 6–23)
CO2: 27 meq/L (ref 19–32)
Calcium: 9.4 mg/dL (ref 8.4–10.5)
Chloride: 101 mEq/L (ref 96–112)
Creatinine, Ser: 1.02 mg/dL (ref 0.40–1.50)
GFR: 76.01 mL/min (ref >60.00)
GLUCOSE: 94 mg/dL (ref 70–99)
POTASSIUM: 4.3 meq/L (ref 3.5–5.1)
SODIUM: 137 meq/L (ref 135–145)
TOTAL PROTEIN: 6.4 g/dL (ref 6.0–8.3)
Total Bilirubin: 0.8 mg/dL (ref 0.2–1.2)

## 2016-07-25 LAB — CBC
HEMATOCRIT: 51.9 % (ref 39.0–52.0)
HEMOGLOBIN: 17.5 g/dL — AB (ref 13.0–17.0)
MCHC: 33.8 g/dL (ref 30.0–36.0)
MCV: 88.4 fl (ref 78.0–100.0)
Platelets: 241 10*3/uL (ref 150.0–400.0)
RBC: 5.87 Mil/uL — ABNORMAL HIGH (ref 4.22–5.81)
RDW: 14.7 % (ref 11.5–15.5)
WBC: 13.4 10*3/uL — AB (ref 4.0–10.5)

## 2016-07-25 LAB — PSA: PSA: 10.69 ng/mL — ABNORMAL HIGH (ref 0.10–4.00)

## 2016-07-26 ENCOUNTER — Encounter: Payer: Self-pay | Admitting: *Deleted

## 2016-07-29 ENCOUNTER — Other Ambulatory Visit: Payer: Self-pay | Admitting: Specialist

## 2016-07-29 DIAGNOSIS — R0609 Other forms of dyspnea: Principal | ICD-10-CM

## 2016-08-05 ENCOUNTER — Ambulatory Visit: Payer: Medicare Other | Attending: Specialist

## 2016-08-17 ENCOUNTER — Telehealth: Payer: Self-pay | Admitting: *Deleted

## 2016-08-17 NOTE — Telephone Encounter (Signed)
Notified patient that annual lung cancer screening low dose CT scan is due. Confirmed that patient is within the age range of 55-77, and asymptomatic, (no signs or symptoms of lung cancer). Patient denies illness that would prevent curative treatment for lung cancer if found. The patient is a current smoker, with a 56 pack year history. The shared decision making visit was done 08/07/15 Patient is agreeable for CT scan being scheduled.

## 2016-08-24 ENCOUNTER — Other Ambulatory Visit: Payer: Self-pay | Admitting: *Deleted

## 2016-08-24 DIAGNOSIS — Z87891 Personal history of nicotine dependence: Secondary | ICD-10-CM

## 2016-09-02 ENCOUNTER — Ambulatory Visit
Admission: RE | Admit: 2016-09-02 | Discharge: 2016-09-02 | Disposition: A | Discharge status: Home / Self Care | Payer: Medicare Other | Source: Ambulatory Visit | Attending: Oncology | Admitting: Oncology

## 2016-09-02 DIAGNOSIS — K760 Fatty (change of) liver, not elsewhere classified: Secondary | ICD-10-CM | POA: Insufficient documentation

## 2016-09-02 DIAGNOSIS — Z87891 Personal history of nicotine dependence: Secondary | ICD-10-CM | POA: Insufficient documentation

## 2016-09-02 DIAGNOSIS — R918 Other nonspecific abnormal finding of lung field: Secondary | ICD-10-CM | POA: Insufficient documentation

## 2016-09-02 DIAGNOSIS — I700 Atherosclerosis of aorta: Secondary | ICD-10-CM | POA: Insufficient documentation

## 2016-09-21 ENCOUNTER — Telehealth: Payer: Self-pay | Admitting: *Deleted

## 2016-09-21 NOTE — Telephone Encounter (Signed)
Noted, thanks Shawn!

## 2016-09-21 NOTE — Telephone Encounter (Signed)
Notified patient of LDCT lung cancer screening results with recommendation for 3 month follow up imaging. Also notified of incidental finding noted below. Patient verbalizes understanding. Copy of this note will be sent to PCP via Epic.  IMPRESSION: 1. Lung-RADS Category 4A, suspicious. Follow up low-dose chest CT without contrast in 3 months (please use the following order, "CT CHEST LCS NODULE FOLLOW-UP W/O CM") is recommended. At right lower lobe endobronchial focus of increased density could represent mucoid impaction or endobronchial nodule. 2.  Aortic atherosclerosis. 3. Hepatic steatosis.

## 2016-10-17 ENCOUNTER — Ambulatory Visit: Payer: Medicare Other | Admitting: Primary Care

## 2016-11-14 ENCOUNTER — Telehealth: Payer: Self-pay | Admitting: *Deleted

## 2016-11-14 NOTE — Telephone Encounter (Signed)
Notified patient that lung cancer screening CT scan follow up imaging is due. Confirmed that patient is within the age range of 55-77, and asymptomatic, (no signs or symptoms of lung cancer). Patient denies illness that would prevent curative treatment for lung cancer if found. The patient is a current smoker, with a 56 pack year history. The shared decision making visit was done 08/07/15. Patient is agreeable for CT scan being scheduled. Information sent to business office.

## 2016-12-07 ENCOUNTER — Other Ambulatory Visit: Payer: Self-pay | Admitting: *Deleted

## 2016-12-07 DIAGNOSIS — R9389 Abnormal findings on diagnostic imaging of other specified body structures: Secondary | ICD-10-CM

## 2016-12-22 ENCOUNTER — Ambulatory Visit: Payer: Medicare Other

## 2016-12-23 ENCOUNTER — Telehealth: Payer: Self-pay | Admitting: *Deleted

## 2016-12-23 NOTE — Telephone Encounter (Signed)
Message left for patient who has been made aware by business office that lung screening scan is not approved by insurance and he is not to come to that appt today. Also, noted patient has Ct scheduled by Dr. Gust Brooms office on 01/12/17 and patient should keep that appt for scan. I will follow patient.

## 2017-01-12 ENCOUNTER — Ambulatory Visit
Admission: RE | Admit: 2017-01-12 | Discharge: 2017-01-12 | Disposition: A | Discharge status: Home / Self Care | Payer: Medicare Other | Source: Ambulatory Visit | Attending: Specialist | Admitting: Specialist

## 2017-01-12 DIAGNOSIS — I2510 Atherosclerotic heart disease of native coronary artery without angina pectoris: Secondary | ICD-10-CM | POA: Insufficient documentation

## 2017-01-12 DIAGNOSIS — R0609 Other forms of dyspnea: Secondary | ICD-10-CM | POA: Insufficient documentation

## 2017-01-12 DIAGNOSIS — J439 Emphysema, unspecified: Secondary | ICD-10-CM | POA: Diagnosis not present

## 2017-01-12 DIAGNOSIS — I700 Atherosclerosis of aorta: Secondary | ICD-10-CM | POA: Insufficient documentation

## 2017-01-12 DIAGNOSIS — J42 Unspecified chronic bronchitis: Secondary | ICD-10-CM | POA: Diagnosis not present

## 2017-01-12 DIAGNOSIS — Z720 Tobacco use: Secondary | ICD-10-CM | POA: Diagnosis not present

## 2017-01-12 DIAGNOSIS — R0600 Dyspnea, unspecified: Secondary | ICD-10-CM | POA: Diagnosis not present

## 2017-01-17 DIAGNOSIS — J449 Chronic obstructive pulmonary disease, unspecified: Secondary | ICD-10-CM | POA: Diagnosis not present

## 2017-01-18 ENCOUNTER — Encounter: Payer: Self-pay | Admitting: Primary Care

## 2017-01-18 ENCOUNTER — Ambulatory Visit: Payer: Medicare Other | Admitting: Primary Care

## 2017-01-18 ENCOUNTER — Ambulatory Visit (INDEPENDENT_AMBULATORY_CARE_PROVIDER_SITE_OTHER): Payer: Medicare Other | Admitting: Primary Care

## 2017-01-18 VITALS — BP 124/88 | HR 89 | Temp 97.5°F | Wt 223.5 lb

## 2017-01-18 DIAGNOSIS — R3912 Poor urinary stream: Secondary | ICD-10-CM

## 2017-01-18 DIAGNOSIS — N401 Enlarged prostate with lower urinary tract symptoms: Secondary | ICD-10-CM

## 2017-01-18 DIAGNOSIS — R7303 Prediabetes: Secondary | ICD-10-CM

## 2017-01-18 DIAGNOSIS — F419 Anxiety disorder, unspecified: Secondary | ICD-10-CM | POA: Insufficient documentation

## 2017-01-18 DIAGNOSIS — C61 Malignant neoplasm of prostate: Secondary | ICD-10-CM | POA: Insufficient documentation

## 2017-01-18 DIAGNOSIS — J449 Chronic obstructive pulmonary disease, unspecified: Secondary | ICD-10-CM

## 2017-01-18 DIAGNOSIS — R972 Elevated prostate specific antigen [PSA]: Secondary | ICD-10-CM

## 2017-01-18 LAB — LIPID PANEL
CHOLESTEROL: 171 mg/dL (ref 0–200)
HDL: 42.9 mg/dL (ref >39.00)
LDL Cholesterol: 112 mg/dL — ABNORMAL HIGH (ref 0–99)
NonHDL: 128.36
TRIGLYCERIDES: 81 mg/dL (ref 0.0–149.0)
Total CHOL/HDL Ratio: 4
VLDL: 16.2 mg/dL (ref 0.0–40.0)

## 2017-01-18 LAB — CBC
HEMATOCRIT: 53.7 % — AB (ref 39.0–52.0)
Hemoglobin: 17.8 g/dL — ABNORMAL HIGH (ref 13.0–17.0)
MCHC: 33.2 g/dL (ref 30.0–36.0)
MCV: 91.1 fl (ref 78.0–100.0)
Platelets: 234 10*3/uL (ref 150.0–400.0)
RBC: 5.9 Mil/uL — AB (ref 4.22–5.81)
RDW: 15 % (ref 11.5–15.5)
WBC: 9.9 10*3/uL (ref 4.0–10.5)

## 2017-01-18 LAB — BASIC METABOLIC PANEL
BUN: 14 mg/dL (ref 6–23)
CO2: 27 meq/L (ref 19–32)
Calcium: 9.1 mg/dL (ref 8.4–10.5)
Chloride: 105 mEq/L (ref 96–112)
Creatinine, Ser: 1.08 mg/dL (ref 0.40–1.50)
GFR: 71.07 mL/min (ref >60.00)
GLUCOSE: 111 mg/dL — AB (ref 70–99)
Potassium: 4.2 mEq/L (ref 3.5–5.1)
SODIUM: 139 meq/L (ref 135–145)

## 2017-01-18 LAB — PSA: PSA: 8.42 ng/mL — AB (ref 0.10–4.00)

## 2017-01-18 LAB — HEMOGLOBIN A1C: Hgb A1c MFr Bld: 6 % (ref 4.6–6.5)

## 2017-01-18 MED ORDER — CIALIS 5 MG PO TABS: ORAL_TABLET | ORAL | 0 refills | Status: DC

## 2017-01-18 NOTE — Progress Notes (Signed)
Subjective:    Patient ID: Ricardo Erickson, male    DOB: [birth year], 76 y.o.   MRN: [id number]  HPI  Ricardo Erickson is a 76 year old male who presents today to discuss medication and is requesting lab work. He is concerned about his CBC, PSA and would like these checked.  1) Elevated PSA: Last PSA in our office of 10.69. He is following with Urology and has an appointment scheduled for later this month. He does experience difficulty with his urinary stream and was once managed on Avodart and Flomax which eventually stopped working. He was managed on Cialis most recently.   2) Prediabetes: A1C of 6.1 in May 2017. He endorses a poor diet and is not exercising. He is due for recheck today.   3) PTSD/Anxiety: History of anxiety that he only experiences during medical appointments. He was previously managed on Diazepam and is requesting refills or something to "take the edge off". GAD 7 score of 10 today. He denies daily symptoms of anxiety, worry, irritability.   Review of Systems  Constitutional: Negative for fatigue.  Respiratory:       Chronic SOB  Cardiovascular: Negative for chest pain.  Genitourinary: Positive for difficulty urinating. Negative for hematuria.  Neurological: Negative for weakness.  Psychiatric/Behavioral: Negative for sleep disturbance and suicidal ideas.       Intermittent anxiety       Past Medical History:  Diagnosis Date  . Depression   . Emphysema lung ([place])   . Personal history of tobacco use, presenting hazards to health 08/06/2015  . Pollen allergies   . PTSD (post-traumatic stress disorder)      Social History   Social History  . Marital status: Widowed    Spouse name: N/A  . Number of children: N/A  . Years of education: N/A   Occupational History  . Not on file.   Social History Main Topics  . Smoking status: Current Every Day Smoker  . Smokeless tobacco: Not on file  . Alcohol use No  . Drug use: Unknown  . Sexual activity: Not Currently    Other Topics Concern  . Not on file   Social History Narrative   Widower.   2 children, 3 grandchildren, 1 great grandchild.   Retired. Now works as a Gaffer.   Enjoys relaxing, spending time with family, watching basketball.       Past Surgical History:  Procedure Laterality Date  . TONSILLECTOMY AND ADENOIDECTOMY  1980    Family History  Problem Relation Age of Onset  . Alcohol abuse Father   . Arthritis Mother   . Heart disease Maternal Grandmother   . Prostate cancer Brother     No Known Allergies  Current Outpatient Prescriptions on File Prior to Visit  Medication Sig Dispense Refill  . albuterol (PROVENTIL HFA;VENTOLIN HFA) 108 (90 Base) MCG/ACT inhaler Inhale 2 puffs into the lungs as needed for wheezing or shortness of breath.    Marland Kitchen albuterol (PROVENTIL) (2.5 MG/3ML) 0.083% nebulizer solution Inhale 2.5 mg into the lungs every 6 (six) hours as needed.     Marland Kitchen aspirin EC 81 MG tablet Take 81 mg by mouth daily. Reported on 04/01/2016    . Fluticasone-Salmeterol (ADVAIR DISKUS) 250-50 MCG/DOSE AEPB Inhale 1 puff into the lungs daily. Reported on 04/01/2016    . pramipexole (MIRAPEX) 1 MG tablet Take 1 tablet (1 mg total) by mouth daily. 90 tablet 3  . Zoster Vaccine Live, PF, (ZOSTAVAX) 60454 UNT/0.65ML injection Inject  19,400 Units into the skin once. (Patient not taking: Reported on 01/18/2017) 1 each 0   No current facility-administered medications on file prior to visit.     BP 124/88 (BP Location: Right Arm, Patient Position: Sitting, Cuff Size: Normal)   Pulse 89   Temp 97.5 F (36.4 C) (Oral)   Wt 223 lb 8 oz (101.4 kg)   SpO2 98%   BMI 32.07 kg/m    Objective:   Physical Exam  Constitutional: He appears well-nourished.  Neck: Neck supple.  Cardiovascular: Normal rate and regular rhythm.   Pulmonary/Chest: Effort normal. He has no wheezes.  Skin: Skin is warm and dry.  Psychiatric: He has a normal mood and affect.          Assessment & Plan:

## 2017-01-18 NOTE — Assessment & Plan Note (Signed)
History of PTSD. Symptoms present during medical office visits. Discussed that we will not be refilling valium as this isn't best practice. He declines other medications as his symptoms are overall mild. He will update.

## 2017-01-18 NOTE — Assessment & Plan Note (Signed)
Level of 10.69 on prior labs, recheck today. Following with Urology.

## 2017-01-18 NOTE — Assessment & Plan Note (Signed)
Refilled Cialis today, will have him discuss cheaper options with Urology. Check PSA today.

## 2017-01-18 NOTE — Assessment & Plan Note (Signed)
Following with pulmonology every 3-6 months. Managed on Advair and albuterol.

## 2017-01-18 NOTE — Assessment & Plan Note (Signed)
Noted on labs from May 2017, recheck today. Lipids borderline, recheck today.

## 2017-01-18 NOTE — Progress Notes (Signed)
Pre visit review using our clinic review tool, if applicable. No additional management support is needed unless otherwise documented below in the visit note. 

## 2017-01-18 NOTE — Patient Instructions (Signed)
I sent refills of Cialis 5 mg to your pharmacy. Take 1 tablet by mouth daily. Talk with your Urologist about further refills.  Complete lab work prior to leaving today. I will notify you of your results once received.   It's important to improve your diet by reducing consumption of fast food, fried food, processed snack foods, sugary drinks. Increase consumption of fresh vegetables and fruits, whole grains, water.  Ensure you are drinking 64 ounces of water daily.  Start exercising. You should be getting 150 minutes of exercise weekly.  It was a pleasure to see you today!

## 2017-01-19 ENCOUNTER — Telehealth: Payer: Self-pay

## 2017-01-19 ENCOUNTER — Telehealth: Payer: Self-pay | Admitting: Primary Care

## 2017-01-19 NOTE — Telephone Encounter (Signed)
Received PA request for Cialis. Tried doing it online on CMM but it is saying eligibility cannot be verified. Called pt to verify his benefits. He verified [facility] HMO Enhanced.  [place] [phone]. Spoke to [place]. She told me how to do it on CMM.  PA started. Waiting on response

## 2017-01-19 NOTE — Telephone Encounter (Signed)
Pt returned call about labs please call cell number [phone] Thanks

## 2017-01-20 ENCOUNTER — Encounter: Payer: Self-pay | Admitting: *Deleted

## 2017-01-20 NOTE — Telephone Encounter (Signed)
Spoken and notified patient of Kate's comments. Patient verbalized understanding. 

## 2017-01-26 NOTE — Telephone Encounter (Signed)
[place] with [facility] Medicare appeals left v/m requesting cb for expediated appeal for Cialis.Needs names of other meds pt has tried for BPH dx. Herman request cb.

## 2017-01-27 NOTE — Telephone Encounter (Signed)
Faxed Part D appeals paperwork on 01/25/2017 and 01/27/2017.

## 2017-02-08 DIAGNOSIS — J449 Chronic obstructive pulmonary disease, unspecified: Secondary | ICD-10-CM | POA: Diagnosis not present

## 2017-02-08 DIAGNOSIS — R972 Elevated prostate specific antigen [PSA]: Secondary | ICD-10-CM | POA: Diagnosis not present

## 2017-03-23 ENCOUNTER — Other Ambulatory Visit: Payer: Self-pay | Admitting: Primary Care

## 2017-03-23 DIAGNOSIS — G2581 Restless legs syndrome: Secondary | ICD-10-CM

## 2017-04-10 ENCOUNTER — Ambulatory Visit: Payer: Medicare Other | Admitting: Primary Care

## 2017-04-25 ENCOUNTER — Other Ambulatory Visit: Payer: Self-pay | Admitting: Specialist

## 2017-04-25 DIAGNOSIS — R918 Other nonspecific abnormal finding of lung field: Secondary | ICD-10-CM

## 2017-04-25 DIAGNOSIS — R0609 Other forms of dyspnea: Secondary | ICD-10-CM

## 2017-04-25 DIAGNOSIS — R05 Cough: Secondary | ICD-10-CM | POA: Diagnosis not present

## 2017-04-25 DIAGNOSIS — J449 Chronic obstructive pulmonary disease, unspecified: Secondary | ICD-10-CM | POA: Diagnosis not present

## 2017-04-25 DIAGNOSIS — Z720 Tobacco use: Secondary | ICD-10-CM | POA: Diagnosis not present

## 2017-05-02 ENCOUNTER — Ambulatory Visit
Admission: RE | Admit: 2017-05-02 | Discharge: 2017-05-02 | Disposition: A | Discharge status: Home / Self Care | Payer: Medicare Other | Source: Ambulatory Visit | Attending: Specialist | Admitting: Specialist

## 2017-05-02 DIAGNOSIS — I700 Atherosclerosis of aorta: Secondary | ICD-10-CM | POA: Diagnosis not present

## 2017-05-02 DIAGNOSIS — J439 Emphysema, unspecified: Secondary | ICD-10-CM | POA: Diagnosis not present

## 2017-05-02 DIAGNOSIS — R0609 Other forms of dyspnea: Secondary | ICD-10-CM | POA: Diagnosis not present

## 2017-05-02 DIAGNOSIS — R0602 Shortness of breath: Secondary | ICD-10-CM | POA: Diagnosis not present

## 2017-05-02 DIAGNOSIS — R918 Other nonspecific abnormal finding of lung field: Secondary | ICD-10-CM

## 2017-05-02 DIAGNOSIS — I2510 Atherosclerotic heart disease of native coronary artery without angina pectoris: Secondary | ICD-10-CM | POA: Insufficient documentation

## 2017-05-18 DIAGNOSIS — Z720 Tobacco use: Secondary | ICD-10-CM | POA: Diagnosis not present

## 2017-05-18 DIAGNOSIS — R0602 Shortness of breath: Secondary | ICD-10-CM | POA: Diagnosis not present

## 2017-05-18 DIAGNOSIS — J449 Chronic obstructive pulmonary disease, unspecified: Secondary | ICD-10-CM | POA: Diagnosis not present

## 2017-06-08 ENCOUNTER — Encounter: Payer: Self-pay | Admitting: Internal Medicine

## 2017-06-08 ENCOUNTER — Ambulatory Visit (INDEPENDENT_AMBULATORY_CARE_PROVIDER_SITE_OTHER): Payer: Medicare Other | Admitting: Internal Medicine

## 2017-06-08 VITALS — BP 136/80 | HR 95 | Temp 97.6°F | Resp 16 | Ht 69.0 in | Wt 220.0 lb

## 2017-06-08 DIAGNOSIS — F17200 Nicotine dependence, unspecified, uncomplicated: Secondary | ICD-10-CM

## 2017-06-08 DIAGNOSIS — J439 Emphysema, unspecified: Secondary | ICD-10-CM | POA: Diagnosis not present

## 2017-06-08 DIAGNOSIS — R972 Elevated prostate specific antigen [PSA]: Secondary | ICD-10-CM | POA: Diagnosis not present

## 2017-06-08 NOTE — Progress Notes (Signed)
Subjective:    Patient ID: Ricardo Erickson, male    DOB: 10/25/1941, 76 y.o.   MRN: [id number]  HPI  Pt presents to the clinic today to discuss his elevated PSA. His most recent result was 8.42, down from 10.29. He has had a negative biopsy by Urology. He is currently not taking any BPH medications at this time. He last saw urology 01/2017- note reviewed. He reports he is due to see urology soon and he needs his PSA checked prior to his appt.  He reports he smokes 1 ppd for 40 years. He has not been able to quit smoking before. He does have emphysema. He though about trying Chantix but reports he wants to go to an inpatient rehab to help him quit smoking. He has a history of depression and PTSD. He does not drink alcohol. No recreational drug use.  Review of Systems  Past Medical History:  Diagnosis Date  . Depression   . Emphysema lung ([place])   . Personal history of tobacco use, presenting hazards to health 08/06/2015  . Pollen allergies   . PTSD (post-traumatic stress disorder)     Current Outpatient Prescriptions  Medication Sig Dispense Refill  . albuterol (PROVENTIL HFA;VENTOLIN HFA) 108 (90 Base) MCG/ACT inhaler Inhale 2 puffs into the lungs as needed for wheezing or shortness of breath.    Marland Kitchen albuterol (PROVENTIL) (2.5 MG/3ML) 0.083% nebulizer solution Inhale 2.5 mg into the lungs every 6 (six) hours as needed.     Marland Kitchen aspirin EC 81 MG tablet Take 81 mg by mouth daily. Reported on 04/01/2016    . CIALIS 5 MG tablet Take 1 tablet daily for difficulty urinating. 30 tablet 0  . Fluticasone-Salmeterol (ADVAIR DISKUS) 250-50 MCG/DOSE AEPB Inhale 1 puff into the lungs daily. Reported on 04/01/2016    . pramipexole (MIRAPEX) 1 MG tablet take 1 tablet by mouth once daily 90 tablet 1  . Zoster Vaccine Live, PF, (ZOSTAVAX) 75643 UNT/0.65ML injection Inject 19,400 Units into the skin once. (Patient not taking: Reported on 01/18/2017) 1 each 0   No current facility-administered medications for this  visit.     No Known Allergies  Family History  Problem Relation Age of Onset  . Alcohol abuse Father   . Arthritis Mother   . Heart disease Maternal Grandmother   . Prostate cancer Brother     Social History   Social History  . Marital status: Widowed    Spouse name: N/A  . Number of children: N/A  . Years of education: N/A   Occupational History  . Not on file.   Social History Main Topics  . Smoking status: Current Every Day Smoker  . Smokeless tobacco: Not on file  . Alcohol use No  . Drug use: Unknown  . Sexual activity: Not Currently   Other Topics Concern  . Not on file   Social History Narrative   Widower.   2 children, 3 grandchildren, 1 great grandchild.   Retired. Now works as a Gaffer.   Enjoys relaxing, spending time with family, watching basketball.        Constitutional: Denies fever, malaise, fatigue, headache or abrupt weight changes.  HEENT: Denies eye pain, eye redness, ear pain, ringing in the ears, wax buildup, runny nose, nasal congestion, bloody nose, or sore throat. Respiratory: Pt reports cough and shortness of breath. Denies difficulty breathing, or sputum production.    GU: Pt reports urgency, frequency and nocturia. Denies pain with urination, burning sensation, blood in  urine, odor or discharge. Psych: Pt has history of depression. Denies anxiety, SI/HI.  No other specific complaints in a complete review of systems (except as listed in HPI above).     Objective:   Physical Exam  BP 136/80   Pulse 95   Temp 97.6 F (36.4 C) (Oral)   Resp 16   Ht 5\' 9"  (1.753 m)   Wt 220 lb (99.8 kg)   SpO2 96%   BMI 32.49 kg/m  Wt Readings from Last 3 Encounters:  06/08/17 220 lb (99.8 kg)  01/18/17 223 lb 8 oz (101.4 kg)  09/02/16 220 lb (99.8 kg)    General: Appears his stated age, chronically ill appearing, in NAD. Pulmonary/Chest: Normal effort and diminshed vesicular breath sounds. No respiratory distress. No wheezes, rales or  ronchi noted.  Abdomen: Soft and nontender. Normal bowel sounds.   BMET    Component Value Date/Time   NA 139 01/18/2017 1032   NA 136 04/10/2012 0427   K 4.2 01/18/2017 1032   K 3.7 04/10/2012 0427   CL 105 01/18/2017 1032   CL 102 04/10/2012 0427   CO2 27 01/18/2017 1032   CO2 24 04/10/2012 0427   GLUCOSE 111 (H) 01/18/2017 1032   GLUCOSE 103 (H) 04/10/2012 0427   BUN 14 01/18/2017 1032   BUN 9 04/10/2012 0427   CREATININE 1.08 01/18/2017 1032   CREATININE 0.85 04/10/2012 0427   CALCIUM 9.1 01/18/2017 1032   CALCIUM 8.7 04/10/2012 0427   GFRNONAA >60 04/10/2012 0427   GFRAA >60 04/10/2012 0427    Lipid Panel     Component Value Date/Time   CHOL 171 01/18/2017 1032   TRIG 81.0 01/18/2017 1032   HDL 42.90 01/18/2017 1032   CHOLHDL 4 01/18/2017 1032   VLDL 16.2 01/18/2017 1032   LDLCALC 112 (H) 01/18/2017 1032    CBC    Component Value Date/Time   WBC 9.9 01/18/2017 1032   RBC 5.90 (H) 01/18/2017 1032   HGB 17.8 (H) 01/18/2017 1032   HGB 16.3 04/10/2012 0427   HCT 53.7 (H) 01/18/2017 1032   HCT 48.1 04/10/2012 0427   PLT 234.0 01/18/2017 1032   PLT 158 04/10/2012 0427   MCV 91.1 01/18/2017 1032   MCV 90 04/10/2012 0427   MCH 30.5 04/10/2012 0427   MCHC 33.2 01/18/2017 1032   RDW 15.0 01/18/2017 1032   RDW 13.6 04/10/2012 0427   LYMPHSABS 2.5 04/13/2016 1111   MONOABS 1.3 (H) 04/13/2016 1111   EOSABS 0.2 04/13/2016 1111   BASOSABS 0.1 04/13/2016 1111    Hgb A1C Lab Results  Component Value Date   HGBA1C 6.0 01/18/2017            Assessment & Plan:   Elevated PSA:  PSA today Advised him to follow up with urology  Smoker:  I do not think Chantix would be a good idea for him given his depression and PTSD He reports he has tried and failed Wellbutrin in the past He wants an inpatient stay to help him quit smoking and will pay out of pocket if necessary, will discuss with PCP  Return precautions discussed Webb Silversmith, NP

## 2017-06-08 NOTE — Patient Instructions (Signed)
Prostate-Specific Antigen Test Why am I having this test? The prostate-specific antigen (PSA) test is performed to determine how much PSA you have in your blood. PSA is a type of protein that is normally present in the prostate gland. Certain conditions can cause PSA blood levels to increase, such as:  Infection in the prostate (prostatitis).  Enlargement of the prostate (hypertrophy).  Prostate cancer.  Because PSA levels increase greatly from prostate cancer, this test can be used to confirm a diagnosis of prostate cancer. It may also be used to monitor treatment for prostate cancer and to watch for a return of prostate cancer after treatment has finished. This test has a very high false-positive rate. Therefore, routine PSA screening for all men is no longer recommended. A false-positive result is incorrect because it indicates a condition or finding is present when it is not. What kind of sample is taken? A blood sample is required for this test. It is usually collected by inserting a needle into a vein or by sticking a finger with a small needle. How do I prepare for this test? There is no preparation required for this test. However, there are factors that can affect the results of a PSA test. To get the most accurate results:  Avoid having a rectal exam within several hours before having your blood drawn for this test.  Avoid having any procedures performed on the prostate gland within 6 weeks of having this test.  Avoid ejaculating within 24 hours of having this test.  Tell your health care provider if you had a recent urinary tract infection (UTI).  Tell your health care provider if you are taking medicines to assist with hair growth, such as finasteride.  Tell your health care provider if you have been exposed to a medicine called diethylstilbestrol.  Let your health care provider know if any of these factors apply to you. You may be asked to reschedule the test. What are the  reference ranges? Reference ranges are established after testing a large group of people. Reference ranges may vary among different people, labs, and hospitals. It is your responsibility to obtain your test results. Ask the lab or department performing the test when and how you will get your results.  Low: 0-2.5 ng/mL.  Slightly to moderately elevated: 2.6-10.0 ng/mL.  Moderately elevated: 10.0-19.9 ng/mL.  Significantly elevated: 20 ng/mL or greater.  What do the results mean? PSA test results greater than 4 ng/mL are found in the majority of men with prostate cancer. If your test result is above this level, this can indicate an increased risk for prostate cancer. Increased PSA levels can also indicate other health conditions. Talk with your health care provider to discuss your results, treatment options, and if necessary, the need for more tests. Talk with your health care provider if you have any questions about your results. Talk with your health care provider to discuss your results, treatment options, and if necessary, the need for more tests. Talk with your health care provider if you have any questions about your results. This information is not intended to replace advice given to you by your health care provider. Make sure you discuss any questions you have with your health care provider. Document Released: 12/31/2004 Document Revised: 08/03/2016 Document Reviewed: 04/23/2014 Elsevier Interactive Patient Education  2018 Elsevier Inc.  

## 2017-06-09 LAB — PSA, MEDICARE: PSA: 10.33 ng/mL — AB (ref 0.10–4.00)

## 2017-06-12 ENCOUNTER — Telehealth: Payer: Self-pay | Admitting: Internal Medicine

## 2017-06-12 ENCOUNTER — Telehealth: Payer: Self-pay | Admitting: Primary Care

## 2017-06-12 ENCOUNTER — Encounter: Payer: Self-pay | Admitting: *Deleted

## 2017-06-12 NOTE — Telephone Encounter (Signed)
Pt called to get results from 6/28 labs. He is requesting a cb when avail.

## 2017-06-12 NOTE — Telephone Encounter (Signed)
Patient called to get his lab results.  °

## 2017-06-12 NOTE — Telephone Encounter (Signed)
Ricardo Erickson pt, result is in Chan's box

## 2017-06-13 ENCOUNTER — Telehealth: Payer: Self-pay | Admitting: Primary Care

## 2017-06-13 NOTE — Telephone Encounter (Signed)
Message left for patient to return my call.  

## 2017-06-13 NOTE — Telephone Encounter (Signed)
Pt requesting rx. Placed triage form in rx tower.

## 2017-06-13 NOTE — Telephone Encounter (Signed)
Needs appt to be seen. 

## 2017-06-13 NOTE — Telephone Encounter (Signed)
Please advise. Ricardo Erickson is out of the office. Patient filled out a Triage/Walk in request form. It stated that patient is request anxiety medication. 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

## 2017-06-15 NOTE — Telephone Encounter (Addendum)
Spoken to patient and for what he is requesting. He will need office.  Patient stated that he will call back

## 2017-06-19 ENCOUNTER — Ambulatory Visit (INDEPENDENT_AMBULATORY_CARE_PROVIDER_SITE_OTHER): Payer: Medicare Other | Admitting: Primary Care

## 2017-06-19 ENCOUNTER — Encounter: Payer: Self-pay | Admitting: Primary Care

## 2017-06-19 VITALS — BP 134/86 | HR 60 | Temp 98.1°F | Ht 69.0 in | Wt 221.4 lb

## 2017-06-19 DIAGNOSIS — C61 Malignant neoplasm of prostate: Secondary | ICD-10-CM

## 2017-06-19 DIAGNOSIS — F411 Generalized anxiety disorder: Secondary | ICD-10-CM | POA: Diagnosis not present

## 2017-06-19 DIAGNOSIS — R109 Unspecified abdominal pain: Secondary | ICD-10-CM | POA: Diagnosis not present

## 2017-06-19 DIAGNOSIS — F419 Anxiety disorder, unspecified: Secondary | ICD-10-CM

## 2017-06-19 LAB — POC URINALSYSI DIPSTICK (AUTOMATED)
Bilirubin, UA: NEGATIVE
Glucose, UA: NEGATIVE
Ketones, UA: NEGATIVE
Nitrite, UA: NEGATIVE
PH UA: 6 (ref 5.0–8.0)
Protein, UA: NEGATIVE
Urobilinogen, UA: 0.2 E.U./dL

## 2017-06-19 MED ORDER — BUSPIRONE HCL 7.5 MG PO TABS: ORAL_TABLET | ORAL | 0 refills | Status: DC

## 2017-06-19 NOTE — Progress Notes (Signed)
Subjective:    Patient ID: Ricardo Erickson, male    DOB: 1941-05-07, 76 y.o.   MRN: [id number]  HPI  Ricardo Erickson is a 76 year old male who presents today with a chief complaint of anxiety. He walked into our clinic one week ago requesting anxiety medication. 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 He has noticed symptoms of flank pain and urinary incontinence. He denies hematuria, dysuria. He was removed from his Avodart and Flomax per Urology.  Symptoms of anxiety/PTSD since the age of 75 years old. He's undergone counseling with improvement in the past. He's also been evaluated by psychiatry in the past and was on medication at that time. He was previously managed on diazepam, Prozac in the past. The diazepam did help with acute anxiety.  Symptoms include feeling anxious, nervous, feeling shaky. These symptoms occur once monthly on average during medical appointments. He's been taking several of his cousins Buspar tablets for the past several days, he's not sure if he noticed improvement. He will be undergoing more frequent medical appointments soon given the news of his cancer. GAD 7 score of 14 today.   Review of Systems  Constitutional: Negative for fever.  Genitourinary: Positive for flank pain.       Urinary incontinence   Psychiatric/Behavioral: Negative for sleep disturbance and suicidal ideas. The patient is nervous/anxious.        See HPI       Past Medical History:  Diagnosis Date  . Depression   . Emphysema lung ([place])   . Personal history of tobacco use, presenting hazards to health 08/06/2015  . Pollen allergies   . PTSD (post-traumatic stress disorder)      Social History   Social History  . Marital status: Widowed    Spouse name: Ricardo Erickson  . Number of children: Ricardo Erickson  . Years of education: Ricardo Erickson   Occupational History  . Not on file.   Social History Main Topics  . Smoking status: Current  Every Day Smoker    Packs/day: 1.50    Years: 60.00  . Smokeless tobacco: Never Used  . Alcohol use No  . Drug use: No  . Sexual activity: Not Currently   Other Topics Concern  . Not on file   Social History Narrative   Widower.   2 children, 3 grandchildren, 1 great grandchild.   Retired. Now works as a Gaffer.   Enjoys relaxing, spending time with family, watching basketball.       Past Surgical History:  Procedure Laterality Date  . TONSILLECTOMY AND ADENOIDECTOMY  1980    Family History  Problem Relation Age of Onset  . Alcohol abuse Father   . Arthritis Mother   . Heart disease Maternal Grandmother   . Prostate cancer Brother     No Known Allergies  Current Outpatient Prescriptions on File Prior to Visit  Medication Sig Dispense Refill  . albuterol (PROVENTIL HFA;VENTOLIN HFA) 108 (90 Base) MCG/ACT inhaler Inhale 2 puffs into the lungs as needed for wheezing or shortness of breath.    Marland Kitchen albuterol (PROVENTIL) (2.5 MG/3ML) 0.083% nebulizer solution Inhale 2.5 mg into the lungs every 6 (six) hours as needed.     Marland Kitchen aspirin EC 81 MG tablet Take 81 mg by mouth daily. Reported on 04/01/2016    . Fluticasone-Salmeterol (ADVAIR DISKUS) 250-50 MCG/DOSE AEPB Inhale 1 puff into the lungs daily. Reported on 04/01/2016    .  pramipexole (MIRAPEX) 1 MG tablet take 1 tablet by mouth once daily 90 tablet 1  . CIALIS 5 MG tablet Take 1 tablet daily for difficulty urinating. (Patient not taking: Reported on 06/19/2017) 30 tablet 0   No current facility-administered medications on file prior to visit.     BP 134/86   Pulse 60   Temp 98.1 F (36.7 C) (Oral)   Ht 5\' 9"  (1.753 m)   Wt 221 lb 6.4 oz (100.4 kg)   SpO2 97%   BMI 32.70 kg/m    Objective:   Physical Exam  Constitutional: He appears well-nourished.  Neck: Neck supple.  Cardiovascular: Normal rate and regular rhythm.   Pulmonary/Chest: Effort normal and breath sounds normal.  Skin: Skin is warm and dry.  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 Stable from prior visits.  Psychiatric: He has a normal mood and affect.          Assessment & Plan:

## 2017-06-19 NOTE — Patient Instructions (Signed)
Start buspirone (Buspar) 7.5 mg for anxiety. Take 1 tablet by mouth once daily for 1 week, then increase to 1 tablet twice daily thereafter. Take this medication everyday.  Follow up in 6 weeks for re-evaluation of anxiety.   It was a pleasure to see you today!

## 2017-06-19 NOTE — Assessment & Plan Note (Signed)
Increased, likely given recent news of cancer. Given long history of anxiety with increase in symptoms will initiate treatment. Discussed treatment options and will try Buspar. Discussed that medication such as diazepam are not best practice for treatment of daily anxiety, will not be prescribing. Discussed potential side effects of Buspar, he verbalized understanding. Follow up in 6 weeks for re-evaluation.

## 2017-06-19 NOTE — Assessment & Plan Note (Addendum)
Following with Urology through [facility]. 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 UA today with 1+ leuks, trace blood. Culture sent.

## 2017-06-21 ENCOUNTER — Other Ambulatory Visit: Payer: Self-pay | Admitting: Internal Medicine

## 2017-06-21 DIAGNOSIS — M4056 Lordosis, unspecified, lumbar region: Secondary | ICD-10-CM | POA: Diagnosis not present

## 2017-06-21 DIAGNOSIS — R1084 Generalized abdominal pain: Secondary | ICD-10-CM | POA: Diagnosis not present

## 2017-06-21 DIAGNOSIS — R972 Elevated prostate specific antigen [PSA]: Secondary | ICD-10-CM | POA: Diagnosis not present

## 2017-06-21 DIAGNOSIS — M5116 Intervertebral disc disorders with radiculopathy, lumbar region: Secondary | ICD-10-CM | POA: Diagnosis not present

## 2017-06-21 DIAGNOSIS — Z0000 Encounter for general adult medical examination without abnormal findings: Secondary | ICD-10-CM | POA: Diagnosis not present

## 2017-06-21 DIAGNOSIS — R102 Pelvic and perineal pain: Secondary | ICD-10-CM | POA: Diagnosis not present

## 2017-06-21 LAB — URINE CULTURE: ORGANISM ID, BACTERIA: NO GROWTH

## 2017-06-24 ENCOUNTER — Inpatient Hospital Stay
Admission: EM | Admit: 2017-06-24 | Discharge: 2017-06-29 | DRG: 515 | Disposition: A | Discharge status: Skilled Nursing Facility | Payer: Medicare Other | Attending: Internal Medicine | Admitting: Internal Medicine

## 2017-06-24 ENCOUNTER — Emergency Department: Payer: Medicare Other

## 2017-06-24 ENCOUNTER — Encounter: Payer: Self-pay | Admitting: Emergency Medicine

## 2017-06-24 DIAGNOSIS — S32050A Wedge compression fracture of fifth lumbar vertebra, initial encounter for closed fracture: Secondary | ICD-10-CM | POA: Diagnosis not present

## 2017-06-24 DIAGNOSIS — M5442 Lumbago with sciatica, left side: Secondary | ICD-10-CM | POA: Diagnosis not present

## 2017-06-24 DIAGNOSIS — M545 Low back pain, unspecified: Secondary | ICD-10-CM

## 2017-06-24 DIAGNOSIS — X58XXXA Exposure to other specified factors, initial encounter: Secondary | ICD-10-CM | POA: Diagnosis present

## 2017-06-24 DIAGNOSIS — F419 Anxiety disorder, unspecified: Secondary | ICD-10-CM | POA: Diagnosis present

## 2017-06-24 DIAGNOSIS — N179 Acute kidney failure, unspecified: Secondary | ICD-10-CM | POA: Diagnosis present

## 2017-06-24 DIAGNOSIS — Z79899 Other long term (current) drug therapy: Secondary | ICD-10-CM | POA: Diagnosis not present

## 2017-06-24 DIAGNOSIS — I481 Persistent atrial fibrillation: Secondary | ICD-10-CM | POA: Diagnosis present

## 2017-06-24 DIAGNOSIS — R410 Disorientation, unspecified: Secondary | ICD-10-CM

## 2017-06-24 DIAGNOSIS — E876 Hypokalemia: Secondary | ICD-10-CM | POA: Diagnosis not present

## 2017-06-24 DIAGNOSIS — I4891 Unspecified atrial fibrillation: Secondary | ICD-10-CM | POA: Diagnosis present

## 2017-06-24 DIAGNOSIS — G9340 Encephalopathy, unspecified: Secondary | ICD-10-CM | POA: Diagnosis present

## 2017-06-24 DIAGNOSIS — J439 Emphysema, unspecified: Secondary | ICD-10-CM | POA: Diagnosis not present

## 2017-06-24 DIAGNOSIS — Z7982 Long term (current) use of aspirin: Secondary | ICD-10-CM

## 2017-06-24 DIAGNOSIS — S32059A Unspecified fracture of fifth lumbar vertebra, initial encounter for closed fracture: Secondary | ICD-10-CM | POA: Diagnosis not present

## 2017-06-24 DIAGNOSIS — G8929 Other chronic pain: Secondary | ICD-10-CM | POA: Diagnosis present

## 2017-06-24 DIAGNOSIS — Z743 Need for continuous supervision: Secondary | ICD-10-CM | POA: Diagnosis not present

## 2017-06-24 DIAGNOSIS — S32000A Wedge compression fracture of unspecified lumbar vertebra, initial encounter for closed fracture: Secondary | ICD-10-CM | POA: Diagnosis not present

## 2017-06-24 DIAGNOSIS — D72829 Elevated white blood cell count, unspecified: Secondary | ICD-10-CM | POA: Diagnosis not present

## 2017-06-24 DIAGNOSIS — N400 Enlarged prostate without lower urinary tract symptoms: Secondary | ICD-10-CM | POA: Diagnosis not present

## 2017-06-24 DIAGNOSIS — J449 Chronic obstructive pulmonary disease, unspecified: Secondary | ICD-10-CM | POA: Diagnosis not present

## 2017-06-24 DIAGNOSIS — J438 Other emphysema: Secondary | ICD-10-CM | POA: Diagnosis present

## 2017-06-24 DIAGNOSIS — R0602 Shortness of breath: Secondary | ICD-10-CM

## 2017-06-24 DIAGNOSIS — I10 Essential (primary) hypertension: Secondary | ICD-10-CM | POA: Diagnosis not present

## 2017-06-24 DIAGNOSIS — R9431 Abnormal electrocardiogram [ECG] [EKG]: Secondary | ICD-10-CM | POA: Diagnosis not present

## 2017-06-24 DIAGNOSIS — F4310 Post-traumatic stress disorder, unspecified: Secondary | ICD-10-CM | POA: Diagnosis not present

## 2017-06-24 DIAGNOSIS — M4856XA Collapsed vertebra, not elsewhere classified, lumbar region, initial encounter for fracture: Principal | ICD-10-CM | POA: Diagnosis present

## 2017-06-24 DIAGNOSIS — R531 Weakness: Secondary | ICD-10-CM | POA: Diagnosis not present

## 2017-06-24 DIAGNOSIS — I480 Paroxysmal atrial fibrillation: Secondary | ICD-10-CM | POA: Diagnosis not present

## 2017-06-24 DIAGNOSIS — K760 Fatty (change of) liver, not elsewhere classified: Secondary | ICD-10-CM | POA: Diagnosis not present

## 2017-06-24 DIAGNOSIS — F17210 Nicotine dependence, cigarettes, uncomplicated: Secondary | ICD-10-CM | POA: Diagnosis present

## 2017-06-24 DIAGNOSIS — J810 Acute pulmonary edema: Secondary | ICD-10-CM | POA: Diagnosis not present

## 2017-06-24 DIAGNOSIS — F329 Major depressive disorder, single episode, unspecified: Secondary | ICD-10-CM | POA: Diagnosis not present

## 2017-06-24 DIAGNOSIS — M5136 Other intervertebral disc degeneration, lumbar region: Secondary | ICD-10-CM | POA: Diagnosis not present

## 2017-06-24 DIAGNOSIS — J9601 Acute respiratory failure with hypoxia: Secondary | ICD-10-CM | POA: Diagnosis not present

## 2017-06-24 DIAGNOSIS — S22050A Wedge compression fracture of T5-T6 vertebra, initial encounter for closed fracture: Secondary | ICD-10-CM | POA: Diagnosis not present

## 2017-06-24 DIAGNOSIS — M549 Dorsalgia, unspecified: Secondary | ICD-10-CM | POA: Diagnosis not present

## 2017-06-24 DIAGNOSIS — S32050D Wedge compression fracture of fifth lumbar vertebra, subsequent encounter for fracture with routine healing: Secondary | ICD-10-CM | POA: Diagnosis not present

## 2017-06-24 DIAGNOSIS — Z4789 Encounter for other orthopedic aftercare: Secondary | ICD-10-CM | POA: Diagnosis not present

## 2017-06-24 DIAGNOSIS — F4322 Adjustment disorder with anxiety: Secondary | ICD-10-CM | POA: Diagnosis not present

## 2017-06-24 DIAGNOSIS — S32050B Wedge compression fracture of fifth lumbar vertebra, initial encounter for open fracture: Secondary | ICD-10-CM | POA: Diagnosis not present

## 2017-06-24 DIAGNOSIS — Z419 Encounter for procedure for purposes other than remedying health state, unspecified: Secondary | ICD-10-CM

## 2017-06-24 DIAGNOSIS — Z720 Tobacco use: Secondary | ICD-10-CM | POA: Diagnosis not present

## 2017-06-24 DIAGNOSIS — F418 Other specified anxiety disorders: Secondary | ICD-10-CM | POA: Diagnosis not present

## 2017-06-24 DIAGNOSIS — R4182 Altered mental status, unspecified: Secondary | ICD-10-CM | POA: Diagnosis not present

## 2017-06-24 HISTORY — DX: Other chronic pain: G89.29

## 2017-06-24 HISTORY — DX: Dorsalgia, unspecified: M54.9

## 2017-06-24 HISTORY — DX: Essential (primary) hypertension: I10

## 2017-06-24 LAB — TSH: TSH: 1.528 u[IU]/mL (ref 0.350–4.500)

## 2017-06-24 LAB — URINALYSIS, COMPLETE (UACMP) WITH MICROSCOPIC
Bacteria, UA: NONE SEEN
Bilirubin Urine: NEGATIVE
GLUCOSE, UA: NEGATIVE mg/dL
HGB URINE DIPSTICK: NEGATIVE
KETONES UR: NEGATIVE mg/dL
Leukocytes, UA: NEGATIVE
Nitrite: NEGATIVE
PROTEIN: NEGATIVE mg/dL
SQUAMOUS EPITHELIAL / LPF: NONE SEEN
Specific Gravity, Urine: 1.03 (ref 1.005–1.030)
pH: 5 (ref 5.0–8.0)

## 2017-06-24 LAB — BASIC METABOLIC PANEL
Anion gap: 11 (ref 5–15)
BUN: 14 mg/dL (ref 6–20)
CALCIUM: 9 mg/dL (ref 8.9–10.3)
CO2: 25 mmol/L (ref 22–32)
CREATININE: 1.05 mg/dL (ref 0.61–1.24)
Chloride: 97 mmol/L — ABNORMAL LOW (ref 101–111)
GFR calc Af Amer: >60 mL/min (ref >60)
GFR calc non Af Amer: >60 mL/min (ref >60)
GLUCOSE: 133 mg/dL — AB (ref 65–99)
Potassium: 3.2 mmol/L — ABNORMAL LOW (ref 3.5–5.1)
Sodium: 133 mmol/L — ABNORMAL LOW (ref 135–145)

## 2017-06-24 LAB — COMPREHENSIVE METABOLIC PANEL
ALBUMIN: 3.8 g/dL (ref 3.5–5.0)
ALT: 11 U/L — AB (ref 17–63)
AST: 24 U/L (ref 15–41)
Alkaline Phosphatase: 76 U/L (ref 38–126)
Anion gap: 13 (ref 5–15)
BUN: 13 mg/dL (ref 6–20)
CHLORIDE: 98 mmol/L — AB (ref 101–111)
CO2: 24 mmol/L (ref 22–32)
CREATININE: 1.14 mg/dL (ref 0.61–1.24)
Calcium: 9.3 mg/dL (ref 8.9–10.3)
GFR calc Af Amer: >60 mL/min (ref >60)
Glucose, Bld: 104 mg/dL — ABNORMAL HIGH (ref 65–99)
Potassium: 3.4 mmol/L — ABNORMAL LOW (ref 3.5–5.1)
SODIUM: 135 mmol/L (ref 135–145)
Total Bilirubin: 1.7 mg/dL — ABNORMAL HIGH (ref 0.3–1.2)
Total Protein: 6.9 g/dL (ref 6.5–8.1)

## 2017-06-24 LAB — PHOSPHORUS: Phosphorus: 4.2 mg/dL (ref 2.5–4.6)

## 2017-06-24 LAB — CBC WITH DIFFERENTIAL/PLATELET
BASOS ABS: 0.1 10*3/uL (ref 0–0.1)
BASOS PCT: 1 %
EOS ABS: 0.1 10*3/uL (ref 0–0.7)
EOS PCT: 1 %
HCT: 52.1 % — ABNORMAL HIGH (ref 40.0–52.0)
Hemoglobin: 17.5 g/dL (ref 13.0–18.0)
LYMPHS PCT: 13 %
Lymphs Abs: 1.7 10*3/uL (ref 1.0–3.6)
MCH: 30.4 pg (ref 26.0–34.0)
MCHC: 33.6 g/dL (ref 32.0–36.0)
MCV: 90.4 fL (ref 80.0–100.0)
MONO ABS: 1.4 10*3/uL — AB (ref 0.2–1.0)
Monocytes Relative: 10 %
Neutro Abs: 10.4 10*3/uL — ABNORMAL HIGH (ref 1.4–6.5)
Neutrophils Relative %: 75 %
PLATELETS: 225 10*3/uL (ref 150–440)
RBC: 5.76 MIL/uL (ref 4.40–5.90)
RDW: 14.9 % — AB (ref 11.5–14.5)
WBC: 13.7 10*3/uL — AB (ref 3.8–10.6)

## 2017-06-24 LAB — MAGNESIUM
Magnesium: 1.8 mg/dL (ref 1.7–2.4)
Magnesium: 1.7 mg/dL (ref 1.7–2.4)

## 2017-06-24 LAB — BRAIN NATRIURETIC PEPTIDE: B Natriuretic Peptide: 48 pg/mL (ref 0.0–100.0)

## 2017-06-24 LAB — CBC
HEMATOCRIT: 51 % (ref 40.0–52.0)
Hemoglobin: 17 g/dL (ref 13.0–18.0)
MCH: 29.8 pg (ref 26.0–34.0)
MCHC: 33.2 g/dL (ref 32.0–36.0)
MCV: 89.6 fL (ref 80.0–100.0)
Platelets: 227 10*3/uL (ref 150–440)
RBC: 5.7 MIL/uL (ref 4.40–5.90)
RDW: 14.7 % — AB (ref 11.5–14.5)
WBC: 13.8 10*3/uL — ABNORMAL HIGH (ref 3.8–10.6)

## 2017-06-24 LAB — GLUCOSE, CAPILLARY: Glucose-Capillary: 120 mg/dL — ABNORMAL HIGH (ref 65–99)

## 2017-06-24 LAB — TROPONIN I
Troponin I: <0.03 ng/mL (ref <0.03)
Troponin I: <0.03 ng/mL (ref <0.03)

## 2017-06-24 LAB — LACTIC ACID, PLASMA: Lactic Acid, Venous: 1.2 mmol/L (ref 0.5–1.9)

## 2017-06-24 LAB — LIPASE, BLOOD: LIPASE: 21 U/L (ref 11–51)

## 2017-06-24 MED ORDER — FUROSEMIDE 10 MG/ML IJ SOLN: 20.0000 mg | Freq: Once | INTRAMUSCULAR | Status: DC

## 2017-06-24 MED ORDER — SODIUM CHLORIDE 0.9 % IV SOLN
Freq: Once | INTRAVENOUS | Status: AC
  Administered 2017-06-24: 23:00:00 via INTRAVENOUS

## 2017-06-24 MED ORDER — ONDANSETRON HCL 4 MG/2ML IJ SOLN
4.0000 mg | Freq: Once | INTRAMUSCULAR | Status: AC
  Administered 2017-06-24: 4 mg via INTRAVENOUS

## 2017-06-24 MED ORDER — ACETAMINOPHEN 325 MG PO TABS: 650.0000 mg | ORAL_TABLET | Freq: Four times a day (QID) | ORAL | Status: DC | PRN

## 2017-06-24 MED ORDER — IOPAMIDOL (ISOVUE-300) INJECTION 61%
100.0000 mL | Freq: Once | INTRAVENOUS | Status: AC | PRN
  Administered 2017-06-24: 100 mL via INTRAVENOUS

## 2017-06-24 MED ORDER — MORPHINE SULFATE (PF) 4 MG/ML IV SOLN
4.0000 mg | Freq: Once | INTRAVENOUS | Status: AC
Start: 2017-06-24 — End: 2017-06-24
  Administered 2017-06-24: 4 mg via INTRAVENOUS
  Filled 2017-06-24: qty 1

## 2017-06-24 MED ORDER — ONDANSETRON HCL 4 MG/2ML IJ SOLN
INTRAMUSCULAR | Status: AC
  Filled 2017-06-24: qty 2

## 2017-06-24 MED ORDER — ONDANSETRON HCL 4 MG/2ML IJ SOLN
4.0000 mg | Freq: Four times a day (QID) | INTRAMUSCULAR | Status: DC | PRN
  Administered 2017-06-26: 4 mg via INTRAVENOUS
  Filled 2017-06-24: qty 2

## 2017-06-24 MED ORDER — FUROSEMIDE 10 MG/ML IJ SOLN: 20.0000 mg | Freq: Two times a day (BID) | INTRAMUSCULAR | Status: DC

## 2017-06-24 MED ORDER — DEXTROSE 5 % IV SOLN
5.0000 mg/h | INTRAVENOUS | Status: DC
  Administered 2017-06-24: 5 mg/h via INTRAVENOUS
  Filled 2017-06-24: qty 100

## 2017-06-24 MED ORDER — BISACODYL 5 MG PO TBEC
5.0000 mg | DELAYED_RELEASE_TABLET | Freq: Every day | ORAL | Status: DC | PRN
  Administered 2017-06-28: 5 mg via ORAL
  Filled 2017-06-24 (×2): qty 1

## 2017-06-24 MED ORDER — OXYCODONE HCL 5 MG PO TABS
5.0000 mg | ORAL_TABLET | ORAL | Status: DC | PRN
  Administered 2017-06-25 – 2017-06-29 (×3): 5 mg via ORAL
  Filled 2017-06-24 (×3): qty 1

## 2017-06-24 MED ORDER — POLYETHYLENE GLYCOL 3350 17 G PO PACK
17.0000 g | PACK | Freq: Every day | ORAL | Status: DC | PRN
  Filled 2017-06-24: qty 1

## 2017-06-24 MED ORDER — VERAPAMIL HCL 2.5 MG/ML IV SOLN
5.0000 mg | Freq: Once | INTRAVENOUS | Status: AC
  Administered 2017-06-24: 5 mg via INTRAVENOUS
  Filled 2017-06-24: qty 2

## 2017-06-24 MED ORDER — ENOXAPARIN SODIUM 40 MG/0.4ML ~~LOC~~ SOLN: 40.0000 mg | SUBCUTANEOUS | Status: DC

## 2017-06-24 MED ORDER — DOCUSATE SODIUM 100 MG PO CAPS
100.0000 mg | ORAL_CAPSULE | Freq: Two times a day (BID) | ORAL | Status: DC
  Administered 2017-06-24 – 2017-06-29 (×7): 100 mg via ORAL
  Filled 2017-06-24 (×7): qty 1

## 2017-06-24 MED ORDER — IOPAMIDOL (ISOVUE-300) INJECTION 61%: 30.0000 mL | Freq: Once | INTRAVENOUS | Status: DC | PRN

## 2017-06-24 MED ORDER — KETOROLAC TROMETHAMINE 15 MG/ML IJ SOLN
15.0000 mg | Freq: Four times a day (QID) | INTRAMUSCULAR | Status: AC | PRN
  Administered 2017-06-25 – 2017-06-26 (×2): 15 mg via INTRAVENOUS
  Filled 2017-06-24 (×3): qty 1

## 2017-06-24 MED ORDER — ACETAMINOPHEN 650 MG RE SUPP: 650.0000 mg | Freq: Four times a day (QID) | RECTAL | Status: DC | PRN

## 2017-06-24 MED ORDER — LORAZEPAM 2 MG/ML IJ SOLN
1.0000 mg | Freq: Once | INTRAMUSCULAR | Status: AC
  Administered 2017-06-24: 1 mg via INTRAVENOUS
  Filled 2017-06-24: qty 1

## 2017-06-24 MED ORDER — ONDANSETRON HCL 4 MG PO TABS: 4.0000 mg | ORAL_TABLET | Freq: Four times a day (QID) | ORAL | Status: DC | PRN

## 2017-06-24 MED ORDER — HYDROMORPHONE HCL 1 MG/ML IJ SOLN
0.5000 mg | Freq: Once | INTRAMUSCULAR | Status: AC
  Administered 2017-06-24: 0.5 mg via INTRAVENOUS
  Filled 2017-06-24: qty 1

## 2017-06-24 MED ORDER — NICOTINE 14 MG/24HR TD PT24
14.0000 mg | MEDICATED_PATCH | Freq: Once | TRANSDERMAL | Status: AC
  Administered 2017-06-24: 14 mg via TRANSDERMAL
  Filled 2017-06-24: qty 1

## 2017-06-24 NOTE — H&P (Signed)
[facility] [place] NAME: Lenin Kuhnle    MR#:  [id number]  DATE OF BIRTH:  Nov 04, 1941  DATE OF ADMISSION:  06/24/2017  PRIMARY CARE PHYSICIAN: Rusty Aus, MD   REQUESTING/REFERRING PHYSICIAN: Dr. Jimmye Norman  CHIEF COMPLAINT:  Increasing back pain  HISTORY OF PRESENT ILLNESS:  Boykin Baetz  is a 76 y.o. male with a known history of Chronic back pain and Emphysema along with ongoing tobacco abuse, hypertension, depression comes to the emergency room accompanied by son and daughter-in-law after patient started having increasing back pain for last couple days. He was seen as outpatient by Dr. Sabra Heck his primary care physician and given his problems with prostate enlargement and PSA was done which was reported as 10.33. Patient was to follow-up and get a CT of the abdomen. He started having excruciating lower back pain and brought to the emergency room where CT scan of the abdomen showed L5 compression fracture with about 10% height loss. Patient was not getting very restless with his pain his heart rate went up in the 120s and on telemetry he was found to be in A. fib. Patient does not have history of A. fib or any other cardiac history. He received 1 dose of IV verapamil 5 mg. Heart rate remained in the 130s. He is to be started on IV Cardizem drip.  Patient also notices he has bilateral feet swelling for last 3-4 days. His BNP is 34.   PAST MEDICAL HISTORY:   Past Medical History:  Diagnosis Date  . Chronic back pain   . Depression   . Emphysema lung ([place])   . Hypertension   . Personal history of tobacco use, presenting hazards to health 08/06/2015  . Pollen allergies   . PTSD (post-traumatic stress disorder)     PAST SURGICAL HISTOIRY:   Past Surgical History:  Procedure Laterality Date  . TONSILLECTOMY AND ADENOIDECTOMY  1980    SOCIAL HISTORY:   Social History  Substance Use Topics  . Smoking status: Current Every  Day Smoker    Packs/day: 1.50    Years: 60.00  . Smokeless tobacco: Never Used  . Alcohol use No    FAMILY HISTORY:   Family History  Problem Relation Age of Onset  . Alcohol abuse Father   . Arthritis Mother   . Heart disease Maternal Grandmother   . Prostate cancer Brother     DRUG ALLERGIES:  No Known Allergies  REVIEW OF SYSTEMS:  Review of Systems  Constitutional: Negative for chills, fever and weight loss.  HENT: Negative for ear discharge, ear pain and nosebleeds.   Eyes: Negative for blurred vision, pain and discharge.  Respiratory: Negative for sputum production, shortness of breath, wheezing and stridor.   Cardiovascular: Positive for palpitations. Negative for chest pain, orthopnea and PND.  Gastrointestinal: Negative for abdominal pain, diarrhea, nausea and vomiting.  Genitourinary: Negative for frequency and urgency.  Musculoskeletal: Positive for back pain. Negative for joint pain.  Neurological: Positive for weakness. Negative for sensory change, speech change and focal weakness.  Psychiatric/Behavioral: Negative for depression and hallucinations. The patient is not nervous/anxious.      MEDICATIONS AT HOME:   Prior to Admission medications   Medication Sig Start Date End Date Taking? Authorizing Provider  albuterol (PROVENTIL HFA;VENTOLIN HFA) 108 (90 Base) MCG/ACT inhaler Inhale 2 puffs into the lungs as needed for wheezing or shortness of breath.    [provider]  albuterol (PROVENTIL) (2.5 MG/3ML)  0.083% nebulizer solution Inhale 2.5 mg into the lungs every 6 (six) hours as needed.  01/15/15   [provider]  aspirin EC 81 MG tablet Take 81 mg by mouth daily. Reported on 04/01/2016    [provider]  busPIRone (BUSPAR) 7.5 MG tablet Take 1 tablet twice daily for anxiety. 06/19/17   Pleas Koch, NP  CIALIS 5 MG tablet Take 1 tablet daily for difficulty urinating. Patient not taking: Reported on 06/19/2017 01/18/17   Pleas Koch, NP  Fluticasone-Salmeterol (ADVAIR DISKUS) 250-50 MCG/DOSE AEPB Inhale 1 puff into the lungs daily. Reported on 04/01/2016    [provider]  pramipexole (MIRAPEX) 1 MG tablet take 1 tablet by mouth once daily 03/23/17   Pleas Koch, NP      VITAL SIGNS:  Blood pressure 117/77, pulse (!) 124, temperature 98.2 F (36.8 C), temperature source Oral, resp. rate 20, height 5\' 9"  (1.753 m), weight 100.2 kg (221 lb), SpO2 (!) 87 %.  PHYSICAL EXAMINATION:  GENERAL:  76 y.o.-year-old patient lying in the bed with no acute distress. Patient is quite restless with the back pain. EYES: Pupils equal, round, reactive to light and accommodation. No scleral icterus. Extraocular muscles intact.  HEENT: Head atraumatic, normocephalic. Oropharynx and nasopharynx clear.  NECK:  Supple, no jugular venous distention. No thyroid enlargement, no tenderness.  LUNGS: Normal breath sounds bilaterally, no wheezing, rales,rhonchi or crepitation. No use of accessory muscles of respiration.  CARDIOVASCULAR: S1, S2 normal. No murmurs, rubs, or gallops. Tachycardia ABDOMEN: Soft, nontender, nondistended. Bowel sounds present. No organomegaly or mass.  EXTREMITIES: ++ pedal edema, no cyanosis, or clubbing.  NEUROLOGIC: Cranial nerves II through XII are intact. Muscle strength 5/5 in all extremities. Sensation intact. Gait not checked.  PSYCHIATRIC: The patient is alert and oriented x 3. Somewhat anxious  SKIN: No obvious rash, lesion, or ulcer.   LABORATORY PANEL:   CBC  Recent Labs Lab 06/24/17 1528  WBC 13.7*  HGB 17.5  HCT 52.1*  PLT 225   ------------------------------------------------------------------------------------------------------------------  Chemistries   Recent Labs Lab 06/24/17 1528  NA 135  K 3.4*  CL 98*  CO2 24  GLUCOSE 104*  BUN 13  CREATININE 1.14  CALCIUM 9.3  AST 24  ALT 11*  ALKPHOS 76  BILITOT 1.7*    ------------------------------------------------------------------------------------------------------------------  Cardiac Enzymes  Recent Labs Lab 06/24/17 1528  TROPONINI <0.03   ------------------------------------------------------------------------------------------------------------------  RADIOLOGY:  Dg Lumbar Spine 2-3 Views  Result Date: 06/24/2017 CLINICAL DATA:  Low back pain.  No reported injury. EXAM: LUMBAR SPINE - 2-3 VIEW COMPARISON:  None. FINDINGS: This report assumes 5 non rib-bearing lumbar vertebrae. Lumbar vertebral body heights are preserved, with no fracture. Mild degenerative disc disease at L4-5. Moderate degenerative disc disease at L5-S1. No spondylolisthesis. Mild facet arthropathy in the lower lumbar spine. No aggressive appearing focal osseous lesions. Abdominal aortic atherosclerosis. Large focus of soft tissue prominence in the back at the level of the thoracolumbar junction. IMPRESSION: 1. No lumbar spine fracture or spondylolisthesis . 2. Mild-to-moderate lower lumbar degenerative disc disease, most prominent at L5-S1 . 3. Nonspecific large focus of soft tissue prominence in the back at the level of the thoracolumbar junction, correlate with clinical exam . 4.  Aortic Atherosclerosis (ICD10-I70.0). Electronically Signed   By: Ilona Sorrel M.D.   On: 06/24/2017 15:25   Ct Abdomen Pelvis W Contrast  Result Date: 06/24/2017 CLINICAL DATA:  Low back pain, worsening for several weeks. EXAM: CT ABDOMEN AND PELVIS WITH  CONTRAST TECHNIQUE: Multidetector CT imaging of the abdomen and pelvis was performed using the standard protocol following bolus administration of intravenous contrast. CONTRAST:  151mL ISOVUE-300 IOPAMIDOL (ISOVUE-300) INJECTION 61% COMPARISON:  06/24/2017 lumbar spine radiographs. FINDINGS: Lower chest: No significant pulmonary nodules or acute consolidative airspace disease. Hepatobiliary: Normal liver size. Diffuse hepatic steatosis. Simple 1.2 cm  lateral segment left liver lobe cyst. Three additional scattered subcentimeter hypodense liver lesions, too small to characterize. No additional liver lesions. No definite liver surface irregularity. Normal gallbladder with no radiopaque cholelithiasis. No biliary ductal dilatation. Pancreas: Normal, with no mass or duct dilation. Spleen: Normal size. No mass. Adrenals/Urinary Tract: Right adrenal 1.1 cm nodule (series 2/ image 33) with indeterminate density 65 HU. No left adrenal nodules. No hydronephrosis. Subcentimeter hypodense renal cortical lesions in the upper kidneys bilaterally are too small to characterize and require no further follow-up. Normal bladder. Stomach/Bowel: Grossly normal stomach. Normal caliber small bowel with no small bowel wall thickening. Normal appendix. Minimal sigmoid diverticulosis, with no large bowel wall thickening or pericolonic fat stranding. Vascular/Lymphatic: Atherosclerotic abdominal aorta with 2.8 cm ectatic infrarenal abdominal aorta. Patent portal, splenic and renal veins. No pathologically enlarged lymph nodes in the abdomen or pelvis. Reproductive: Moderately enlarged prostate with nonspecific internal prostatic calcifications. Other: No pneumoperitoneum, ascites or focal fluid collection. Small fat containing right inguinal hernia. Small fat containing periumbilical hernia. Musculoskeletal: No aggressive appearing focal osseous lesions. Mild compression fracture of the L5 vertebral body with minimal 10% loss of vertebral body height. No bony retropulsion. No additional fracture. Mild to moderate thoracolumbar spondylosis. IMPRESSION: 1. Mild L5 vertebral body compression fracture, which appears acute. No aggressive appearing focal osseous lesions. 2. Indeterminate right adrenal nodule. If the patient has a history of malignancy, adrenal protocol CT or MRI abdomen without and with IV contrast may be obtained at this time for further evaluation. Otherwise, a follow-up  adrenal protocol CT abdomen may be obtained in 12 months. 3. Aortic atherosclerosis. 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 Ectatic abdominal aorta at risk for aneurysm development. Recommend followup by ultrasound in 5 years. This recommendation follows ACR consensus guidelines: [name]. J Am Coll Radiol 2013; 10:789-794. 4. Additional findings include diffuse hepatic steatosis, minimal sigmoid diverticulosis, moderately enlarged prostate and small fat containing right inguinal and periumbilical hernias. These results were called by telephone at the time of interpretation on 06/24/2017 at 6:46 pm to Dr. Jimmye Norman, who verbally acknowledged these results. Electronically Signed   By: Ilona Sorrel M.D.   On: 06/24/2017 18:49    EKG:   A. fib with RVR IMPRESSION AND PLAN:   Roscoe Witts  is a 76 y.o. male with a known history of Chronic back pain and Emphysema along with ongoing tobacco abuse, hypertension, depression comes to the emergency room accompanied by son and daughter-in-law after patient started having increasing back pain for last couple days. He was seen as outpatient by Dr. Sabra Heck his primary care physician and given his problems with prostate enlargement and PSA was done which was reported as 10.33. Patient was to follow-up and get a CT of the abdomen. He started having excruciating lower back pain and brought to the emergency room where CT scan of the abdomen showed L5 compression fracture with about 10% height loss.  1. A. 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 Acute on chronic back pain due to L5 compression fracture -When necessary pain meds -Ortho consultation -PT consult  3. Bilateral lower extremity edema-new -Echo of the heart -IV Lasix 1 -Cardiac consultation  4. COPD/emphysema with ongoing tobacco abuse -When necessary nebs and inhalers  5. Elevated PSA levels to 10.33 -Patient has history of prostate enlargement on CT renal study -He will need to follow up with urology for further workup as outpatient  6. DVT prophylaxis subcutaneous Lovenox  Above was discussed with patient son and daughter-in-law in the emergency room.  All the records are reviewed and case discussed with ED provider. Management plans discussed with the patient, family and they are in agreement.  CODE STATUS: DO NOT RESUSCITATE this was discussed with patient and patient's son who is the healthcare power of attorney   TOTAL  critical TIME TAKING CARE OF THIS PATIENT: 55es.    Vanellope Passmore M.D on 06/24/2017 at 8:44 PM  Between 7am to 6pm - Pager - [phone]  After 6pm go to [email or web address] - password [email or web address] [facility]  [facility]  Office  [phone]  CC: Primary care physician; Rusty Aus, MD

## 2017-06-24 NOTE — ED Notes (Signed)
In to start second Iv for pt for ICU when he started to become very anxious; wanting to leave and come back tomorrow; pt's daughter now at bedside and attempts to calm father are unsuccessful; pt wanting to take off monitors; breathing heavy; HR 150's; order for Ativan to be entered by Dr Jimmye Norman; RN x 2 and pt relations at bedside to assist in calming pt

## 2017-06-24 NOTE — Progress Notes (Signed)
ICU NP Maggie notified of pt admission

## 2017-06-24 NOTE — ED Provider Notes (Signed)
[facility] Emergency Department Provider Note   ____________________________________________   First MD Initiated Contact with Patient 06/24/17 1412     (approximate)  I have reviewed the triage vital signs and the nursing notes.   HISTORY  Chief Complaint Back Pain and Leg Swelling   HPI Ricardo Erickson is a 76 y.o. male presents for evaluation of increasing back pain  Patient reports the last 2-3 weeks has had increasing pain in his lower back, slightly worse on the left. Reports he has seen his doctor, they informed him that they're concerned he may have prostate cancer and has been treating him with tramadol, now switched to oxycodone which she is taking every 8 hours but reports the pain is becoming worse. Worsened by movement, radiates slightly down the left buttock region. Relieved mostly with sitting still. Denies any fall or injury.  Reports that he's felt slightly constipated for the last 2 weeks after starting pain medicine, but is still moving his bowels. He denies any trouble urinating and has not noticed any weakness or numbness in the lower legs or back.  No chest pain or trouble breathing. He has seen some slight swelling in both legs worsening over the last 2-3 weeks which is primary care doctor has also been following. Reports he supposed to have an x-ray or CAT scan done on Monday as ordered by Dr. Sabra Heck to further evaluate for his back pain  * Review Dr. Ammie Ferrier note from 06/22/2017. "differential is lumbar spine disease versus sarcoma of his back versus metastatic prostate cancer. Will plan abdominal pelvic CT to look at his prostate and the mass on his back as well as intra-abdominal lymph nodes."  Past Medical History:  Diagnosis Date  . Chronic back pain   . Depression   . Emphysema lung ([place])   . Personal history of tobacco use, presenting hazards to health 08/06/2015  . Pollen allergies   . PTSD (post-traumatic stress disorder)      Patient Active Problem List   Diagnosis Date Noted  . Prediabetes 01/18/2017  . Prostate cancer ([place]) 01/18/2017  . Anxiety 01/18/2017  . Preventative health care 04/15/2016  . Restless leg syndrome 04/03/2016  . COPD (chronic obstructive pulmonary disease) ([place]) 04/03/2016  . BPH (benign prostatic hyperplasia) 04/03/2016  . Personal history of tobacco use, presenting hazards to health 08/06/2015    Past Surgical History:  Procedure Laterality Date  . TONSILLECTOMY AND ADENOIDECTOMY  1980    Prior to Admission medications   Medication Sig Start Date End Date Taking? Authorizing Provider  albuterol (PROVENTIL HFA;VENTOLIN HFA) 108 (90 Base) MCG/ACT inhaler Inhale 2 puffs into the lungs as needed for wheezing or shortness of breath.    [provider]  albuterol (PROVENTIL) (2.5 MG/3ML) 0.083% nebulizer solution Inhale 2.5 mg into the lungs every 6 (six) hours as needed.  01/15/15   [provider]  aspirin EC 81 MG tablet Take 81 mg by mouth daily. Reported on 04/01/2016    [provider]  busPIRone (BUSPAR) 7.5 MG tablet Take 1 tablet twice daily for anxiety. 06/19/17   Pleas Koch, NP  CIALIS 5 MG tablet Take 1 tablet daily for difficulty urinating. Patient not taking: Reported on 06/19/2017 01/18/17   Pleas Koch, NP  Fluticasone-Salmeterol (ADVAIR DISKUS) 250-50 MCG/DOSE AEPB Inhale 1 puff into the lungs daily. Reported on 04/01/2016    [provider]  pramipexole (MIRAPEX) 1 MG tablet take 1 tablet by mouth once daily 03/23/17  Pleas Koch, NP    Allergies Patient has no known allergies.  Family History  Problem Relation Age of Onset  . Alcohol abuse Father   . Arthritis Mother   . Heart disease Maternal Grandmother   . Prostate cancer Brother     Social History Social History  Substance Use Topics  . Smoking status: Current Every Day Smoker    Packs/day: 1.50    Years: 60.00  . Smokeless tobacco: Never Used  .  Alcohol use No    Review of Systems Constitutional: No fever/chills Eyes: No visual changes. ENT: No sore throat. Cardiovascular: Denies chest pain. Respiratory: Denies shortness of breath. Gastrointestinal: No abdominal pain.  No nausea, no vomiting.  No diarrhea.  See history of present illness Genitourinary: Negative for dysuria. Slight increase in urination recently. Musculoskeletal: See history of present illness Skin: Negative for rash. Neurological: Negative for headaches, focal weakness or numbness.    ____________________________________________   PHYSICAL EXAM:  VITAL SIGNS: ED Triage Vitals  Enc Vitals Group     BP 06/24/17 1358 132/73     Pulse Rate 06/24/17 1358 60     Resp 06/24/17 1358 20     Temp 06/24/17 1358 98.2 F (36.8 C)     Temp Source 06/24/17 1358 Oral     SpO2 06/24/17 1358 94 %     Weight 06/24/17 1356 221 lb (100.2 kg)     Height 06/24/17 1356 5\' 9"  (1.753 m)     Head Circumference --      Peak Flow --      Pain Score 06/24/17 1356 10     Pain Loc --      Pain Edu? --      Excl. in [place]? --     Constitutional: Alert and oriented. Well appearing and in no acute distress. Eyes: Conjunctivae are normal. Head: Atraumatic. Nose: No congestion/rhinnorhea. Mouth/Throat: Mucous membranes are moist. Neck: No stridor.   Cardiovascular: Normal rate, regular rhythm. Grossly normal heart sounds.  Good peripheral circulation. Respiratory: Normal respiratory effort.  No retractions. Lungs CTAB. Gastrointestinal: Soft and nontender. No distention. Musculoskeletal:  Lower Extremities  No edema. Normal DP/PT pulses bilateral with good cap refill.  Normal neuro-motor function lower extremities bilateral.  RIGHT Right lower extremity demonstrates normal strength, good use of all muscles. No edema bruising or contusions of the right hip, right knee, right ankle. Full range of motion of the right lower extremity without pain. No pain on axial loading. No  evidence of trauma. About 1+ lower extremity edema  LEFT Left lower extremity demonstrates normal strength, good use of all muscles. No edema bruising or contusions of the hip,  knee, ankle. Full range of motion of the left lower extremity without pain. No pain on axial loading. No evidence of trauma. About 1+ lower extremity edema  Normal perirectal sensation with normal rectal tone.  Neurologic:  Normal speech and language. No gross focal neurologic deficits are appreciated.  Skin:  Skin is warm, dry and intact. No rash noted. Psychiatric: Mood and affect are normal. Speech and behavior are normal.  ____________________________________________   LABS (all labs ordered are listed, but only abnormal results are displayed)  Labs Reviewed  URINE CULTURE  CBC WITH DIFFERENTIAL/PLATELET  COMPREHENSIVE METABOLIC PANEL  LIPASE, BLOOD  URINALYSIS, COMPLETE (UACMP) WITH MICROSCOPIC  BRAIN NATRIURETIC PEPTIDE   ____________________________________________  EKG   ____________________________________________  RADIOLOGY  Dg Lumbar Spine 2-3 Views  Result Date: 06/24/2017 CLINICAL DATA:  Low back pain.  No reported injury. EXAM: LUMBAR SPINE - 2-3 VIEW COMPARISON:  None. FINDINGS: This report assumes 5 non rib-bearing lumbar vertebrae. Lumbar vertebral body heights are preserved, with no fracture. Mild degenerative disc disease at L4-5. Moderate degenerative disc disease at L5-S1. No spondylolisthesis. Mild facet arthropathy in the lower lumbar spine. No aggressive appearing focal osseous lesions. Abdominal aortic atherosclerosis. Large focus of soft tissue prominence in the back at the level of the thoracolumbar junction. IMPRESSION: 1. No lumbar spine fracture or spondylolisthesis . 2. Mild-to-moderate lower lumbar degenerative disc disease, most prominent at L5-S1 . 3. Nonspecific large focus of soft tissue prominence in the back at the level of the thoracolumbar junction, correlate with  clinical exam . 4.  Aortic Atherosclerosis (ICD10-I70.0). Electronically Signed   By: Ilona Sorrel M.D.   On: 06/24/2017 15:25    ____________________________________________   PROCEDURES  Procedure(s) performed: None  Procedures  Critical Care performed: No  ____________________________________________   INITIAL IMPRESSION / ASSESSMENT AND PLAN / ED COURSE  Pertinent labs & imaging results that were available during my care of the patient were reviewed by me and considered in my medical decision making (see chart for details).  Patient admits for evaluation of increasing lower back pain for 2-3 weeks. Following with his primary care   Patient denies any neurologic cardiac or pulmonary symptoms. His hemodynamics are stable. Concerned about his increasing back pain. Given his possible history of prostate cancer working up for it, certainly metastatic disease is a consideration. We'll start by ordering plain films and basic labs to further evaluate along with urinalysis. He does not have any hard red flags for back pain other than his age and this concern for possible prostate cancer. He seems to have intact neurologic function lower extremities.   ----------------------------------------- 4:01 PM on 06/24/2017 -----------------------------------------  Ongoing care side Dr. Jimmye Norman. Follow-up pending labs as well as CT abdomen and pelvis to further evaluate for back pain.  ____________________________________________   FINAL CLINICAL IMPRESSION(S) / ED DIAGNOSES  Final diagnoses:  Left low back pain, unspecified chronicity, with sciatica presence unspecified      NEW MEDICATIONS STARTED DURING THIS VISIT:  New Prescriptions   No medications on file     Note:  This document was prepared using Dragon voice recognition software and may include unintentional dictation errors.     Delman Kitten, MD 06/24/17 [phone]

## 2017-06-24 NOTE — ED Notes (Signed)
Pt's son came to RN desk and said the patient needed help; this RN went into the room and found the patient had soiled himself, all of the linens were wet and the floor was wet; this RN asked the son if he would help clean up and change the patient; the son said "I would rather have someone else do it" at which point, this RN informed the son that other ED personnel were occupied with other critical patients at this time, and son hesitantly agreed to help this RN clean up and change the patient; pt was assisted to stand up, the soiled clothing was removed, wet bed linen was removed, patient was cleaned using wet wipes and dry towels; bed was wiped down, and new linen with chux pads were placed on the bed; the patient had briefs and a clean hospital gown put on, and assisted back to the stretcher and hooked back up to the monitoring equipment and oxygen via [place]. Pt expressed gratitude.

## 2017-06-24 NOTE — ED Triage Notes (Signed)
Pt here for chronic back pain that has been getting worse.  Urinated and lost stool but because cannot get to bathroom in time.  Also has new swelling in legs.  No SHOB. Elevated PSA with CT Monday, concern for prostate CA per pt

## 2017-06-24 NOTE — ED Provider Notes (Addendum)
Labs Reviewed  CBC WITH DIFFERENTIAL/PLATELET - Abnormal; Notable for the following:       Result Value   WBC 13.7 (*)    HCT 52.1 (*)    RDW 14.9 (*)    Neutro Abs 10.4 (*)    Monocytes Absolute 1.4 (*)    All other components within normal limits  COMPREHENSIVE METABOLIC PANEL - Abnormal; Notable for the following:    Potassium 3.4 (*)    Chloride 98 (*)    Glucose, Bld 104 (*)    ALT 11 (*)    Total Bilirubin 1.7 (*)    All other components within normal limits  URINE CULTURE  LIPASE, BLOOD  BRAIN NATRIURETIC PEPTIDE  URINALYSIS, COMPLETE (UACMP) WITH MICROSCOPIC   IMPRESSION: 1. Mild L5 vertebral body compression fracture, which appears acute. No aggressive appearing focal osseous lesions. 2. Indeterminate right adrenal nodule. If the patient has a history of malignancy, adrenal protocol CT or MRI abdomen without and with IV contrast may be obtained at this time for further evaluation. Otherwise, a follow-up adrenal protocol CT abdomen may be obtained in 12 months. 3. Aortic atherosclerosis. 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 Ectatic abdominal aorta at risk for aneurysm development. Recommend followup by ultrasound in 5 years. This recommendation follows ACR consensus guidelines: [name]. J Am Coll Radiol 2013; 10:789-794. 4. Additional findings include diffuse hepatic steatosis, minimal sigmoid diverticulosis, moderately enlarged prostate and small fat containing right inguinal and periumbilical hernias. These results were called by telephone at the time of interpretation on 06/24/2017 at 6:46 pm to Dr. Jimmye Norman, who verbally acknowledged these results.     EKG: interpreted by me, atrial Fibrillation with a rapid ventricular response, normal QRS size, normal QT   Patient cannot walk due to back pain which appears to be from L5 compression fracture. He also has new onset atrial fibrillation.  I have given IV verapamil and discussed with the hospitalist for admission. Earleen Newport, MD 06/24/17 [place]    Earleen Newport, MD 06/24/17 2004    Earleen Newport, MD 06/24/17 2005

## 2017-06-25 ENCOUNTER — Inpatient Hospital Stay: Admit: 2017-06-25 | Payer: Medicare Other

## 2017-06-25 ENCOUNTER — Inpatient Hospital Stay: Payer: Medicare Other

## 2017-06-25 DIAGNOSIS — S32050A Wedge compression fracture of fifth lumbar vertebra, initial encounter for closed fracture: Secondary | ICD-10-CM

## 2017-06-25 DIAGNOSIS — I4891 Unspecified atrial fibrillation: Secondary | ICD-10-CM

## 2017-06-25 DIAGNOSIS — R531 Weakness: Secondary | ICD-10-CM

## 2017-06-25 DIAGNOSIS — M545 Low back pain, unspecified: Secondary | ICD-10-CM

## 2017-06-25 LAB — MRSA PCR SCREENING: MRSA BY PCR: NEGATIVE

## 2017-06-25 MED ORDER — DILTIAZEM HCL ER COATED BEADS 180 MG PO CP24
180.0000 mg | ORAL_CAPSULE | Freq: Every day | ORAL | Status: DC
  Administered 2017-06-25: 180 mg via ORAL
  Filled 2017-06-25 (×2): qty 1

## 2017-06-25 MED ORDER — CEFAZOLIN SODIUM-DEXTROSE 2-4 GM/100ML-% IV SOLN
2.0000 g | Freq: Once | INTRAVENOUS | Status: AC
  Administered 2017-06-26: 2 g via INTRAVENOUS
  Filled 2017-06-25: qty 100

## 2017-06-25 MED ORDER — POTASSIUM CHLORIDE CRYS ER 20 MEQ PO TBCR
40.0000 meq | EXTENDED_RELEASE_TABLET | Freq: Once | ORAL | Status: AC
  Administered 2017-06-25: 40 meq via ORAL
  Filled 2017-06-25: qty 2

## 2017-06-25 MED ORDER — ALPRAZOLAM 0.25 MG PO TABS
0.2500 mg | ORAL_TABLET | Freq: Once | ORAL | Status: AC
  Administered 2017-06-25: 0.25 mg via ORAL
  Filled 2017-06-25: qty 1

## 2017-06-25 MED ORDER — FUROSEMIDE 10 MG/ML IJ SOLN
20.0000 mg | Freq: Two times a day (BID) | INTRAMUSCULAR | Status: DC
  Administered 2017-06-25 – 2017-06-27 (×4): 20 mg via INTRAVENOUS
  Filled 2017-06-25 (×2): qty 4
  Filled 2017-06-25: qty 2
  Filled 2017-06-25: qty 4
  Filled 2017-06-25: qty 2

## 2017-06-25 MED ORDER — DILTIAZEM HCL 30 MG PO TABS
30.0000 mg | ORAL_TABLET | Freq: Four times a day (QID) | ORAL | Status: DC
  Administered 2017-06-25: 30 mg via ORAL
  Filled 2017-06-25: qty 1

## 2017-06-25 NOTE — Progress Notes (Signed)
PULMONARY / CRITICAL CARE MEDICINE   Name: JOYCE LECKEY MRN: [id number] DOB: 1941/06/13    ADMISSION DATE:  06/24/2017   CONSULTATION DATE:  06/24/17  REFERRING MD: Dr. Posey Pronto  Reason: ICU management of new onset atrial fibrillation with hemodynamic stability  CHIEF COMPLAINT:  Acute back pain  HISTORY OF PRESENT ILLNESS:   This is a 76 year old Caucasian male who presented to the ED with complaints of acute onset back pain. States that symptoms have gradually got worse over last couple of days, making it hard for him to make it to the bathroom or perform his other activities of daily living. He reports lower extremity weakness, which he attributes to his back. He reports episodes of urinary and stool incontinence, but attributes that to his inability to walk to the bathroom. At the ED, his preliminary workup showed an L5 fracture and new onset atrial fibrillation with a ventricular rate in the 130s. He reports a history of elevated PSA but states that he never followed through with his primary care provider. Patient feels that he might have prostate cancer  SUBJECTIVE: Reports significant improvement in back pain. Heart rate improved. Now on oral diltiazem. Denies chest pain, palpitations, nausea, vomiting but reports persistent back pain with activity  VITAL SIGNS: BP 97/65   Pulse 83   Temp 98 F (36.7 C) (Oral)   Resp 16   Ht 5\' 9"  (1.753 m)   Wt 216 lb 8.6 oz (98.2 kg)   SpO2 97%   BMI 31.98 kg/m   HEMODYNAMICS:    VENTILATOR SETTINGS:    INTAKE / OUTPUT: I/O last 3 completed shifts: In: 79.9 [I.V.:79.9] Out: 425 [Urine:425]  PHYSICAL EXAMINATION: General: No acute distress Neuro:  Alert and oriented to person, place and time, moderate pain with flexion and extension of the C and T spine, age related changes in grip strength in upper and lower extremities, unable to assess gait HEENT:  PERRLA, trachea midline, oral mucosa moist and pink Cardiovascular:  Apical  pulse irregular irregular, mildly tachycardic at a rate of 110 bpm, S1, S2 audible, no murmur, regurg or gallop, +2 lower extremity edema, +2 pulses bilaterally Lungs:  Normal work of breathing, bilateral breath sounds, fine crackles in anterior lung fields, breath sounds diminished in the bases, no wheezes Abdomen: BS, positive bowel sounds in all 4 quadrants Musculoskeletal:  No deformities, no swelling Skin:  Warm and dry  LABS:  BMET  Recent Labs Lab 06/24/17 1528 06/24/17 2248  NA 135 133*  K 3.4* 3.2*  CL 98* 97*  CO2 24 25  BUN 13 14  CREATININE 1.14 1.05  GLUCOSE 104* 133*    Electrolytes  Recent Labs Lab 06/24/17 1528 06/24/17 2248  CALCIUM 9.3 9.0  MG 1.8 1.7  PHOS  --  4.2    CBC  Recent Labs Lab 06/24/17 1528 06/24/17 2248  WBC 13.7* 13.8*  HGB 17.5 17.0  HCT 52.1* 51.0  PLT 225 227    Coag's No results for input(s): APTT, INR in the last 168 hours.  Sepsis Markers  Recent Labs Lab 06/24/17 2300  LATICACIDVEN 1.2    ABG No results for input(s): PHART, PCO2ART, PO2ART in the last 168 hours.  Liver Enzymes  Recent Labs Lab 06/24/17 1528  AST 24  ALT 11*  ALKPHOS 76  BILITOT 1.7*  ALBUMIN 3.8    Cardiac Enzymes  Recent Labs Lab 06/24/17 1528 06/24/17 2248  TROPONINI <0.03 <0.03    Glucose  Recent Labs Lab 06/24/17  [place]*    Imaging Dg Lumbar Spine 2-3 Views  Result Date: 06/24/2017 CLINICAL DATA:  Low back pain.  No reported injury. EXAM: LUMBAR SPINE - 2-3 VIEW COMPARISON:  None. FINDINGS: This report assumes 5 non rib-bearing lumbar vertebrae. Lumbar vertebral body heights are preserved, with no fracture. Mild degenerative disc disease at L4-5. Moderate degenerative disc disease at L5-S1. No spondylolisthesis. Mild facet arthropathy in the lower lumbar spine. No aggressive appearing focal osseous lesions. Abdominal aortic atherosclerosis. Large focus of soft tissue prominence in the back at the level of  the thoracolumbar junction. IMPRESSION: 1. No lumbar spine fracture or spondylolisthesis . 2. Mild-to-moderate lower lumbar degenerative disc disease, most prominent at L5-S1 . 3. Nonspecific large focus of soft tissue prominence in the back at the level of the thoracolumbar junction, correlate with clinical exam . 4.  Aortic Atherosclerosis (ICD10-I70.0). Electronically Signed   By: Ilona Sorrel M.D.   On: 06/24/2017 15:25   Ct Abdomen Pelvis W Contrast  Result Date: 06/24/2017 CLINICAL DATA:  Low back pain, worsening for several weeks. EXAM: CT ABDOMEN AND PELVIS WITH CONTRAST TECHNIQUE: Multidetector CT imaging of the abdomen and pelvis was performed using the standard protocol following bolus administration of intravenous contrast. CONTRAST:  138mL ISOVUE-300 IOPAMIDOL (ISOVUE-300) INJECTION 61% COMPARISON:  06/24/2017 lumbar spine radiographs. FINDINGS: Lower chest: No significant pulmonary nodules or acute consolidative airspace disease. Hepatobiliary: Normal liver size. Diffuse hepatic steatosis. Simple 1.2 cm lateral segment left liver lobe cyst. Three additional scattered subcentimeter hypodense liver lesions, too small to characterize. No additional liver lesions. No definite liver surface irregularity. Normal gallbladder with no radiopaque cholelithiasis. No biliary ductal dilatation. Pancreas: Normal, with no mass or duct dilation. Spleen: Normal size. No mass. Adrenals/Urinary Tract: Right adrenal 1.1 cm nodule (series 2/ image 33) with indeterminate density 65 HU. No left adrenal nodules. No hydronephrosis. Subcentimeter hypodense renal cortical lesions in the upper kidneys bilaterally are too small to characterize and require no further follow-up. Normal bladder. Stomach/Bowel: Grossly normal stomach. Normal caliber small bowel with no small bowel wall thickening. Normal appendix. Minimal sigmoid diverticulosis, with no large bowel wall thickening or pericolonic fat stranding. Vascular/Lymphatic:  Atherosclerotic abdominal aorta with 2.8 cm ectatic infrarenal abdominal aorta. Patent portal, splenic and renal veins. No pathologically enlarged lymph nodes in the abdomen or pelvis. Reproductive: Moderately enlarged prostate with nonspecific internal prostatic calcifications. Other: No pneumoperitoneum, ascites or focal fluid collection. Small fat containing right inguinal hernia. Small fat containing periumbilical hernia. Musculoskeletal: No aggressive appearing focal osseous lesions. Mild compression fracture of the L5 vertebral body with minimal 10% loss of vertebral body height. No bony retropulsion. No additional fracture. Mild to moderate thoracolumbar spondylosis. IMPRESSION: 1. Mild L5 vertebral body compression fracture, which appears acute. No aggressive appearing focal osseous lesions. 2. Indeterminate right adrenal nodule. If the patient has a history of malignancy, adrenal protocol CT or MRI abdomen without and with IV contrast may be obtained at this time for further evaluation. Otherwise, a follow-up adrenal protocol CT abdomen may be obtained in 12 months. 3. Aortic atherosclerosis. 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 Ectatic abdominal aorta at risk for aneurysm development. Recommend followup by ultrasound in 5 years. This recommendation follows ACR consensus guidelines: [name]. J Am Coll Radiol 2013; 10:789-794. 4. Additional findings include diffuse hepatic steatosis, minimal sigmoid diverticulosis, moderately enlarged prostate and small fat containing right inguinal and periumbilical hernias. These results were called by  telephone at the time of interpretation on 06/24/2017 at 6:46 pm to Dr. Jimmye Norman, who verbally acknowledged these results. Electronically Signed   By: Ilona Sorrel M.D.   On: 06/24/2017 18:49   Dg Chest Port 1 View  Result Date: 06/25/2017 CLINICAL DATA:  Severe back pain on admission yesterday.  EXAM: PORTABLE CHEST 1 VIEW COMPARISON:  Chest CT dated 05/02/2017. FINDINGS: Borderline enlarged cardiac silhouette. Prominent pulmonary vasculature and interstitial markings. The lungs are mildly hyperexpanded. Lower thoracic spine degenerative changes. IMPRESSION: Borderline cardiomegaly and mild pulmonary vascular congestion with underlying COPD. Electronically Signed   By: Claudie Revering M.D.   On: 06/25/2017 07:51    STUDIES:  2-D echo  CULTURES: None  ANTIBIOTICS: None  SIGNIFICANT EVENTS: 06/24/2017: ED with acute back pain, found to be in A. fib with RVR  LINES/TUBES: Peripheral IVs  DISCUSSION: 76 year old Caucasian male presenting with acute back pain secondary to L5 compression fracture and new onset atrial fibrillation  ASSESSMENT  New onset afib with rvr Acute back pain 2/2 L5 compression fracture Possible Fall at home Hypokalemia-resolved Tobacco use disorder H/o hypertension H/O Emphysema History of elevated PSA-questionable prostate cancer, patient declined further workup by his primary care provider  PLAN Discontinue Diltiazem gtt  Start diltiazem 30 mg by mouth every 6 hours, hold for heart rate less than 50 EKG when necessary Follow-up cardiac enzymes Pain management with prn dilaudid and oxycodone Follow-up PT/OT evaluation Care management consult for home care needs/placement Follow-up cardiology consult Follow-up 2-D echo Monitor and replace electrolytes Will defer decision to anticoagulate to cardiology given that patient is at high risk for falls Fall precautions Follow up with orthopedic surgery regarding L5 fracture GI/DVT prophylaxis  Patient is stable for transfer out of ICU  [facility] ANP-BC Pulmonary and [place] Pager [phone] or [phone]  06/25/2017, 9:24 AM   PCCM ATTENDING ATTESTATION:  I have evaluated patient with the APP [place], reviewed database in its entirety and discussed  care plan in detail. In addition, this patient was discussed on multidisciplinary rounds.   Will transfer out of ICU/SDU to monitored bed. After transfer, PCCM will sign off. Please call if we can be of further assistance.  Merton Border, MD PCCM service Mobile [phone] Pager [phone] 06/25/2017 12:55 PM

## 2017-06-25 NOTE — Progress Notes (Signed)
Report called to Estill Bamberg, RN on 1A. Pt transfered to room 146 in stable condition. All belongings with patient. Pt family at bedside.  Estill Bamberg RN, aware of multiple attempts to page Nehemiah Massed to verify Cardizem order, currently awaiting call back. Pt alert and oriented.
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
## 2017-06-25 NOTE — Consult Note (Signed)
PULMONARY / CRITICAL CARE MEDICINE   Name: Ricardo Erickson MRN: [id number] DOB: 1941-11-14    ADMISSION DATE:  06/24/2017   CONSULTATION DATE:  06/24/17  REFERRING MD: Dr. Posey Pronto  Reason: ICU management of new onset atrial fibrillation with hemodynamic stability  CHIEF COMPLAINT:  Acute back pain  HISTORY OF PRESENT ILLNESS:   This is a 76 year old Caucasian male who presented to the ED with complaints of acute onset back pain. States that symptoms have gradually got worse over last couple of days, making it hard for him to make it to the bathroom or perform his other activities of daily living. He reports lower extremity weakness, which he attributes to his back. He reports episodes of urinary and stool incontinence, but attributes that to his inability to walk to the bathroom. At the ED, his preliminary workup showed an L5 fracture and new onset atrial fibrillation with a ventricular rate in the 130s. He reports a history of elevated PSA but states that he never followed through with his primary care provider. Patient feels that he might have prostate cancer  PAST MEDICAL HISTORY :  He  has a past medical history of Chronic back pain; Depression; Emphysema lung ([place]); Hypertension; Personal history of tobacco use, presenting hazards to health (08/06/2015); Pollen allergies; and PTSD (post-traumatic stress disorder).  PAST SURGICAL HISTORY: He  has a past surgical history that includes Tonsillectomy and adenoidectomy (1980).  No Known Allergies  No current facility-administered medications on file prior to encounter.    Current Outpatient Prescriptions on File Prior to Encounter  Medication Sig  . albuterol (PROVENTIL HFA;VENTOLIN HFA) 108 (90 Base) MCG/ACT inhaler Inhale 2 puffs into the lungs as needed for wheezing or shortness of breath.  Marland Kitchen albuterol (PROVENTIL) (2.5 MG/3ML) 0.083% nebulizer solution Inhale 2.5 mg into the lungs every 6 (six) hours as needed.   . busPIRone (BUSPAR)  7.5 MG tablet Take 1 tablet twice daily for anxiety.  . Fluticasone-Salmeterol (ADVAIR DISKUS) 250-50 MCG/DOSE AEPB Inhale 1 puff into the lungs daily. Reported on 04/01/2016    FAMILY HISTORY:  His indicated that the status of his mother is unknown. He indicated that the status of his father is unknown. He indicated that the status of his brother is unknown. He indicated that the status of his maternal grandmother is unknown.    SOCIAL HISTORY: He  reports that he has been smoking.  He has a 90.00 pack-year smoking history. He has never used smokeless tobacco. He reports that he does not drink alcohol or use drugs.  REVIEW OF SYSTEMS:   Constitutional: Negative for fever and chills.  HENT: Negative for congestion and rhinorrhea.  Eyes: Negative for redness and visual disturbance.  Respiratory: Positive for mild dyspnea but negative for  wheezing.  Cardiovascular: Negative for chest pain and palpitations.  Gastrointestinal: Negative  for nausea , vomiting and abdominal pain and  Loose stools Genitourinary: Negative for dysuria and urgency.  Endocrine: Denies polyuria, polyphagia and heat intolerance Musculoskeletal: Positive for severe back pain Skin: Negative for pallor and wound.  Neurological: Negative for stool and urinary incontinence, reports mild lower extremity weakness SUBJECTIVE:   VITAL SIGNS: BP 91/62   Pulse 77   Temp 98.1 F (36.7 C) (Axillary)   Resp 15   Ht 5\' 9"  (1.753 m)   Wt 216 lb 8.6 oz (98.2 kg)   SpO2 96%   BMI 31.98 kg/m   HEMODYNAMICS:    VENTILATOR SETTINGS:    INTAKE / OUTPUT: I/O last  3 completed shifts: In: 79.9 [I.V.:79.9] Out: 425 [Urine:425]  PHYSICAL EXAMINATION: General: Moderate distress Neuro:  Alert and oriented to person, place and time, pain with flexion and extension of the C and T spine, age related changes in grip strength in upper and lower extremities, unable to assess gait HEENT:  PERRLA, trachea midline, oral mucosa  moist and pink Cardiovascular:  Apical pulse irregular irregular, mildly tachycardic at a rate of 110 bpm, S1, S2 audible, no murmur, regurg or gallop, +2 lower extremity edema, +2 pulses bilaterally Lungs:  Normal work of breathing, bilateral breath sounds, fine crackles in anterior lung fields, breath sounds diminished in the bases, no wheezes Abdomen: BS, positive bowel sounds in all 4 quadrants Musculoskeletal:  No deformities, no swelling Skin:  Warm and dry  LABS:  BMET  Recent Labs Lab 06/24/17 1528 06/24/17 2248  NA 135 133*  K 3.4* 3.2*  CL 98* 97*  CO2 24 25  BUN 13 14  CREATININE 1.14 1.05  GLUCOSE 104* 133*    Electrolytes  Recent Labs Lab 06/24/17 1528 06/24/17 2248  CALCIUM 9.3 9.0  MG 1.8 1.7  PHOS  --  4.2    CBC  Recent Labs Lab 06/24/17 1528 06/24/17 2248  WBC 13.7* 13.8*  HGB 17.5 17.0  HCT 52.1* 51.0  PLT 225 227    Coag's No results for input(s): APTT, INR in the last 168 hours.  Sepsis Markers  Recent Labs Lab 06/24/17 2300  LATICACIDVEN 1.2    ABG No results for input(s): PHART, PCO2ART, PO2ART in the last 168 hours.  Liver Enzymes  Recent Labs Lab 06/24/17 1528  AST 24  ALT 11*  ALKPHOS 76  BILITOT 1.7*  ALBUMIN 3.8    Cardiac Enzymes  Recent Labs Lab 06/24/17 1528 06/24/17 2248  TROPONINI <0.03 <0.03    Glucose  Recent Labs Lab 06/24/17 2234  GLUCAP 120*    Imaging Dg Lumbar Spine 2-3 Views  Result Date: 06/24/2017 CLINICAL DATA:  Low back pain.  No reported injury. EXAM: LUMBAR SPINE - 2-3 VIEW COMPARISON:  None. FINDINGS: This report assumes 5 non rib-bearing lumbar vertebrae. Lumbar vertebral body heights are preserved, with no fracture. Mild degenerative disc disease at L4-5. Moderate degenerative disc disease at L5-S1. No spondylolisthesis. Mild facet arthropathy in the lower lumbar spine. No aggressive appearing focal osseous lesions. Abdominal aortic atherosclerosis. Large focus of soft tissue  prominence in the back at the level of the thoracolumbar junction. IMPRESSION: 1. No lumbar spine fracture or spondylolisthesis . 2. Mild-to-moderate lower lumbar degenerative disc disease, most prominent at L5-S1 . 3. Nonspecific large focus of soft tissue prominence in the back at the level of the thoracolumbar junction, correlate with clinical exam . 4.  Aortic Atherosclerosis (ICD10-I70.0). Electronically Signed   By: Ilona Sorrel M.D.   On: 06/24/2017 15:25   Ct Abdomen Pelvis W Contrast  Result Date: 06/24/2017 CLINICAL DATA:  Low back pain, worsening for several weeks. EXAM: CT ABDOMEN AND PELVIS WITH CONTRAST TECHNIQUE: Multidetector CT imaging of the abdomen and pelvis was performed using the standard protocol following bolus administration of intravenous contrast. CONTRAST:  159mL ISOVUE-300 IOPAMIDOL (ISOVUE-300) INJECTION 61% COMPARISON:  06/24/2017 lumbar spine radiographs. FINDINGS: Lower chest: No significant pulmonary nodules or acute consolidative airspace disease. Hepatobiliary: Normal liver size. Diffuse hepatic steatosis. Simple 1.2 cm lateral segment left liver lobe cyst. Three additional scattered subcentimeter hypodense liver lesions, too small to characterize. No additional liver lesions. No definite liver surface irregularity. Normal gallbladder with  no radiopaque cholelithiasis. No biliary ductal dilatation. Pancreas: Normal, with no mass or duct dilation. Spleen: Normal size. No mass. Adrenals/Urinary Tract: Right adrenal 1.1 cm nodule (series 2/ image 33) with indeterminate density 65 HU. No left adrenal nodules. No hydronephrosis. Subcentimeter hypodense renal cortical lesions in the upper kidneys bilaterally are too small to characterize and require no further follow-up. Normal bladder. Stomach/Bowel: Grossly normal stomach. Normal caliber small bowel with no small bowel wall thickening. Normal appendix. Minimal sigmoid diverticulosis, with no large bowel wall thickening or  pericolonic fat stranding. Vascular/Lymphatic: Atherosclerotic abdominal aorta with 2.8 cm ectatic infrarenal abdominal aorta. Patent portal, splenic and renal veins. No pathologically enlarged lymph nodes in the abdomen or pelvis. Reproductive: Moderately enlarged prostate with nonspecific internal prostatic calcifications. Other: No pneumoperitoneum, ascites or focal fluid collection. Small fat containing right inguinal hernia. Small fat containing periumbilical hernia. Musculoskeletal: No aggressive appearing focal osseous lesions. Mild compression fracture of the L5 vertebral body with minimal 10% loss of vertebral body height. No bony retropulsion. No additional fracture. Mild to moderate thoracolumbar spondylosis. IMPRESSION: 1. Mild L5 vertebral body compression fracture, which appears acute. No aggressive appearing focal osseous lesions. 2. Indeterminate right adrenal nodule. If the patient has a history of malignancy, adrenal protocol CT or MRI abdomen without and with IV contrast may be obtained at this time for further evaluation. Otherwise, a follow-up adrenal protocol CT abdomen may be obtained in 12 months. 3. Aortic atherosclerosis. 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 Ectatic abdominal aorta at risk for aneurysm development. Recommend followup by ultrasound in 5 years. This recommendation follows ACR consensus guidelines: [name]. J Am Coll Radiol 2013; 10:789-794. 4. Additional findings include diffuse hepatic steatosis, minimal sigmoid diverticulosis, moderately enlarged prostate and small fat containing right inguinal and periumbilical hernias. These results were called by telephone at the time of interpretation on 06/24/2017 at 6:46 pm to Dr. Jimmye Norman, who verbally acknowledged these results. Electronically Signed   By: Ilona Sorrel M.D.   On: 06/24/2017 18:49   Dg Chest Port 1 View  Result Date: 06/25/2017 CLINICAL  DATA:  Severe back pain on admission yesterday. EXAM: PORTABLE CHEST 1 VIEW COMPARISON:  Chest CT dated 05/02/2017. FINDINGS: Borderline enlarged cardiac silhouette. Prominent pulmonary vasculature and interstitial markings. The lungs are mildly hyperexpanded. Lower thoracic spine degenerative changes. IMPRESSION: Borderline cardiomegaly and mild pulmonary vascular congestion with underlying COPD. Electronically Signed   By: Claudie Revering M.D.   On: 06/25/2017 07:51    STUDIES:  2-D echo  CULTURES: None  ANTIBIOTICS: None  SIGNIFICANT EVENTS: 06/24/2017: ED with acute back pain, found to be in A. fib with RVR  LINES/TUBES: Peripheral IVs  DISCUSSION: 76 year old Caucasian male presenting with acute back pain secondary to L5 compression fracture and new onset atrial fibrillation  ASSESSMENT  New onset afib with rvr Acute back pain 2/2 L5 compression fracture Possible Fall at home Hypokalemia Tobacco use disorder H/o hypertension H/O Emphysema History of elevated PSA-questionable prostate cancer, patient declined further workup by his primary care provider  PLAN Diltiazem gtt and transition to PO diltiazem in am STAT EKG-Reviewed Cycle cardiac enzymes Pain management with prn dilaudid and oxycodone PT/OT evaluation Care management consult for home care needs/placement Cardiology consult pending 2-D echo Monitor and replace electrolytes Will defer decision to anticoagulate to cardiology given that patient is at high risk for falls GI/DVT prophylaxis  FAMILY  - Updates: No family at bedside. Will update when available  -  Inter-disciplinary family meet or Palliative Care meeting due by:  day 7    [facility] ANP-BC Pulmonary and [place] Pager [phone] or [phone]  06/25/2017, 9:03 AM   Merton Border, MD PCCM service Mobile [phone] Pager [phone] 06/25/2017 12:55 PM

## 2017-06-25 NOTE — Consult Note (Signed)
[facility] Consultation Note  Patient ID: Ricardo Erickson, MRN: [id number], DOB/AGE: 1941/07/01 76 y.o. Admit date: 06/24/2017   Date of Consult: 06/25/2017 Primary Physician: Rusty Aus, MD Primary Cardiologist: None  Chief Complaint:  Chief Complaint  Patient presents with  . Back Pain  . Leg Swelling   Reason for Consult: atrial fibrillation with rapid ventricular rate  HPI: 76 y.o. male with severe back pain and depression emphysema essential hypertension having worsening back discomfort and distress. The patient arrived emergency room for further treatment and was noticed to have atrial fibrillation with rapid ventricular rate. There was no evidence of significant chest discomfort or significant congestive heart failure at that time requiring further intervention. He has had no evidence of myocardial infarction with a normal troponin. He was given appropriate medication including diltiazem drip for further risk reduction of the rapid heartbeat with success. He has been on heparin for further risk reduction in stroke with atrial fibrillation and will need further evaluation. Currently he is emphysema is controlled  Past Medical History:  Diagnosis Date  . Chronic back pain   . Depression   . Emphysema lung ([place])   . Hypertension   . Personal history of tobacco use, presenting hazards to health 08/06/2015  . Pollen allergies   . PTSD (post-traumatic stress disorder)       Surgical History:  Past Surgical History:  Procedure Laterality Date  . TONSILLECTOMY AND ADENOIDECTOMY  1980     Home Meds: Prior to Admission medications   Medication Sig Start Date End Date Taking? Authorizing Provider  albuterol (PROVENTIL HFA;VENTOLIN HFA) 108 (90 Base) MCG/ACT inhaler Inhale 2 puffs into the lungs as needed for wheezing or shortness of breath.   Yes [provider]  albuterol (PROVENTIL) (2.5 MG/3ML) 0.083% nebulizer solution Inhale 2.5 mg into the lungs every  6 (six) hours as needed.  01/15/15  Yes [provider]  busPIRone (BUSPAR) 7.5 MG tablet Take 1 tablet twice daily for anxiety. 06/19/17  Yes Pleas Koch, NP  Fluticasone-Salmeterol (ADVAIR DISKUS) 250-50 MCG/DOSE AEPB Inhale 1 puff into the lungs daily. Reported on 04/01/2016   Yes [provider]  oxyCODONE-acetaminophen (PERCOCET/ROXICET) 5-325 MG tablet Take 1-2 tablets by mouth every 4 (four) hours as needed for severe pain.   Yes [provider]  pramipexole (MIRAPEX) 1 MG tablet Take 1 mg by mouth at bedtime.   Yes [provider]  triamterene-hydrochlorothiazide (MAXZIDE-25) 37.5-25 MG tablet Take 1 tablet by mouth daily.   Yes [provider]    Inpatient Medications:  . diltiazem  180 mg Oral Daily  . docusate sodium  100 mg Oral BID  . enoxaparin (LOVENOX) injection  40 mg Subcutaneous Q24H  . furosemide  20 mg Intravenous Once  . nicotine  14 mg Transdermal Once     Allergies: No Known Allergies  Social History   Social History  . Marital status: Widowed    Spouse name: N/A  . Number of children: N/A  . Years of education: N/A   Occupational History  . Not on file.   Social History Main Topics  . Smoking status: Current Every Day Smoker    Packs/day: 1.50    Years: 60.00  . Smokeless tobacco: Never Used  . Alcohol use No  . Drug use: No  . Sexual activity: Not Currently   Other Topics Concern  . Not on file   Social History Narrative   Widower.   2 children, 3  grandchildren, 1 great grandchild.   Retired. Now works as a Gaffer.   Enjoys relaxing, spending time with family, watching basketball.        Family History  Problem Relation Age of Onset  . Alcohol abuse Father   . Arthritis Mother   . Heart disease Maternal Grandmother   . Prostate cancer Brother      Review of Systems Positive for Back pain shortness of breath Negative for: General:  chills, fever, night sweats or weight changes.   Cardiovascular: PND orthopnea syncope dizziness  Dermatological skin lesions rashes Respiratory: Cough congestion Urologic: Frequent urination urination at night and hematuria Abdominal: negative for nausea, vomiting, diarrhea, bright red blood per rectum, melena, or hematemesis Neurologic: negative for visual changes, and/or hearing changes  All other systems reviewed and are otherwise negative except as noted above.  Labs:  Recent Labs  06/24/17 1528 06/24/17 2248  TROPONINI <0.03 <0.03   Lab Results  Component Value Date   WBC 13.8 (H) 06/24/2017   HGB 17.0 06/24/2017   HCT 51.0 06/24/2017   MCV 89.6 06/24/2017   PLT 227 06/24/2017    Recent Labs Lab 06/24/17 1528 06/24/17 2248  NA 135 133*  K 3.4* 3.2*  CL 98* 97*  CO2 24 25  BUN 13 14  CREATININE 1.14 1.05  CALCIUM 9.3 9.0  PROT 6.9  --   BILITOT 1.7*  --   ALKPHOS 76  --   ALT 11*  --   AST 24  --   GLUCOSE 104* 133*   Lab Results  Component Value Date   CHOL 171 01/18/2017   HDL 42.90 01/18/2017   LDLCALC 112 (H) 01/18/2017   TRIG 81.0 01/18/2017   No results found for: DDIMER  Radiology/Studies:  Dg Lumbar Spine 2-3 Views  Result Date: 06/24/2017 CLINICAL DATA:  Low back pain.  No reported injury. EXAM: LUMBAR SPINE - 2-3 VIEW COMPARISON:  None. FINDINGS: This report assumes 5 non rib-bearing lumbar vertebrae. Lumbar vertebral body heights are preserved, with no fracture. Mild degenerative disc disease at L4-5. Moderate degenerative disc disease at L5-S1. No spondylolisthesis. Mild facet arthropathy in the lower lumbar spine. No aggressive appearing focal osseous lesions. Abdominal aortic atherosclerosis. Large focus of soft tissue prominence in the back at the level of the thoracolumbar junction. IMPRESSION: 1. No lumbar spine fracture or spondylolisthesis . 2. Mild-to-moderate lower lumbar degenerative disc disease, most prominent at L5-S1 . 3. Nonspecific large focus of soft tissue prominence in the  back at the level of the thoracolumbar junction, correlate with clinical exam . 4.  Aortic Atherosclerosis (ICD10-I70.0). Electronically Signed   By: Ilona Sorrel M.D.   On: 06/24/2017 15:25   Ct Abdomen Pelvis W Contrast  Result Date: 06/24/2017 CLINICAL DATA:  Low back pain, worsening for several weeks. EXAM: CT ABDOMEN AND PELVIS WITH CONTRAST TECHNIQUE: Multidetector CT imaging of the abdomen and pelvis was performed using the standard protocol following bolus administration of intravenous contrast. CONTRAST:  146mL ISOVUE-300 IOPAMIDOL (ISOVUE-300) INJECTION 61% COMPARISON:  06/24/2017 lumbar spine radiographs. FINDINGS: Lower chest: No significant pulmonary nodules or acute consolidative airspace disease. Hepatobiliary: Normal liver size. Diffuse hepatic steatosis. Simple 1.2 cm lateral segment left liver lobe cyst. Three additional scattered subcentimeter hypodense liver lesions, too small to characterize. No additional liver lesions. No definite liver surface irregularity. Normal gallbladder with no radiopaque cholelithiasis. No biliary ductal dilatation. Pancreas: Normal, with no mass or duct dilation. Spleen: Normal size. No mass. Adrenals/Urinary Tract: Right adrenal 1.1 cm  nodule (series 2/ image 33) with indeterminate density 65 HU. No left adrenal nodules. No hydronephrosis. Subcentimeter hypodense renal cortical lesions in the upper kidneys bilaterally are too small to characterize and require no further follow-up. Normal bladder. Stomach/Bowel: Grossly normal stomach. Normal caliber small bowel with no small bowel wall thickening. Normal appendix. Minimal sigmoid diverticulosis, with no large bowel wall thickening or pericolonic fat stranding. Vascular/Lymphatic: Atherosclerotic abdominal aorta with 2.8 cm ectatic infrarenal abdominal aorta. Patent portal, splenic and renal veins. No pathologically enlarged lymph nodes in the abdomen or pelvis. Reproductive: Moderately enlarged prostate with  nonspecific internal prostatic calcifications. Other: No pneumoperitoneum, ascites or focal fluid collection. Small fat containing right inguinal hernia. Small fat containing periumbilical hernia. Musculoskeletal: No aggressive appearing focal osseous lesions. Mild compression fracture of the L5 vertebral body with minimal 10% loss of vertebral body height. No bony retropulsion. No additional fracture. Mild to moderate thoracolumbar spondylosis. IMPRESSION: 1. Mild L5 vertebral body compression fracture, which appears acute. No aggressive appearing focal osseous lesions. 2. Indeterminate right adrenal nodule. If the patient has a history of malignancy, adrenal protocol CT or MRI abdomen without and with IV contrast may be obtained at this time for further evaluation. Otherwise, a follow-up adrenal protocol CT abdomen may be obtained in 12 months. 3. Aortic atherosclerosis. 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 Ectatic abdominal aorta at risk for aneurysm development. Recommend followup by ultrasound in 5 years. This recommendation follows ACR consensus guidelines: [name]. J Am Coll Radiol 2013; 10:789-794. 4. Additional findings include diffuse hepatic steatosis, minimal sigmoid diverticulosis, moderately enlarged prostate and small fat containing right inguinal and periumbilical hernias. These results were called by telephone at the time of interpretation on 06/24/2017 at 6:46 pm to Dr. Jimmye Norman, who verbally acknowledged these results. Electronically Signed   By: Ilona Sorrel M.D.   On: 06/24/2017 18:49    EKG: Atrial fibrillation with rapid ventricular rate and nonspecific ST changes  Weights: Filed Weights   06/24/17 1356 06/24/17 2234  Weight: 100.2 kg (221 lb) 98.2 kg (216 lb 8.6 oz)     Physical Exam: Blood pressure 103/74, pulse 84, temperature 98.1 F (36.7 C), temperature source Axillary, resp. rate 15, height 5\' 9"   (1.753 m), weight 98.2 kg (216 lb 8.6 oz), SpO2 96 %. Body mass index is 31.98 kg/m. General: Well developed, well nourished, in no acute distress. Head eyes ears nose throat: Normocephalic, atraumatic, sclera non-icteric, no xanthomas, nares are without discharge. No apparent thyromegaly and/or mass  Lungs: Normal respiratory effort.  Some wheezes, no rales, few rhonchi.  Heart: Irregular with normal S1 S2. no murmur gallop, no rub, PMI is normal size and placement, carotid upstroke normal without bruit, jugular venous pressure is normal Abdomen: Soft, non-tender, non-distended with normoactive bowel sounds. No hepatomegaly. No rebound/guarding. No obvious abdominal masses. Abdominal aorta is normal size without bruit Extremities: No edema. no cyanosis, no clubbing, no ulcers  Peripheral : 2+ bilateral upper extremity pulses, 2+ bilateral femoral pulses, 2+ bilateral dorsal pedal pulse Neuro: Alert and oriented. No facial asymmetry. No focal deficit. Moves all extremities spontaneously. Musculoskeletal: Normal muscle tone without kyphosis Psych:  Responds to questions appropriately with a normal affect.    Assessment: 76 year old male with severe back pain emphysema essential hypertension with new onset atrial fibrillation nonvalvular in nature with rapid ventricular rate now improved  Plan: 1. Change diltiazem drip to oral diltiazem 180 mg long-acting today with adjustments for a  goal heart rate between 60 and 90 bpm at rest and possible spontaneous conversion to normal sinus rhythm 2. Continue anticoagulation and change to elequis 5 mg twice per day for further risk reduction in stroke with atrial fibrillation while working with above 3. Echocardiogram for LV systolic dysfunction valvular heart disease contributing to above 4. Okay for transfer to floor for further treatment of above 5. Further diagnostic testing and treatment options after  Signed, Corey Skains M.D. [facility] 06/25/2017, 6:13 AM

## 2017-06-25 NOTE — Progress Notes (Signed)
Patient complaining of anxiety, MD paged twice, waiting on callback.

## 2017-06-25 NOTE — Progress Notes (Signed)
PT Cancellation Note  Patient Details Name: Ricardo Erickson MRN: [id number] DOB: 1941/08/20   Cancelled Treatment:    Reason Eval/Treat Not Completed: Patient declined, no reason specified (Consult received and chart reviewed.  Patient/daughter politely declining evaluation, stating plans for kyphoplasty next date.  Prefer to hold evaluation post-procedure for better pain control/participation with evaluation.  Will re-attempt next as patient available and medically appropriate.)   [name] H. Owens Shark, PT, DPT, NCS 06/25/17, 3:07 PM [phone]

## 2017-06-25 NOTE — Progress Notes (Signed)
[place] at [facility]                                                                                                                                                                                  Patient Demographics   Ricardo Erickson, is a 76 y.o. male, DOB - November 29, 1941, YIR:[id number]  Admit date - 06/24/2017   Admitting Physician Fritzi Mandes, MD  Outpatient Primary MD for the patient is Rusty Aus, MD   LOS - 1  Subjective: Patient admitted with severe back pain noted to be in A. fib with RVR Continues to have back pain heart rate improved    Review of Systems:   CONSTITUTIONAL: No documented fever. No fatigue, weakness. No weight gain, no weight loss.  EYES: No blurry or double vision.  ENT: No tinnitus. No postnasal drip. No redness of the oropharynx.  RESPIRATORY: No cough, no wheeze, no hemoptysis. No dyspnea.  CARDIOVASCULAR: No chest pain. No orthopnea. No palpitations. No syncope.  GASTROINTESTINAL: No nausea, no vomiting or diarrhea. No abdominal pain. No melena or hematochezia.  GENITOURINARY: No dysuria or hematuria.  ENDOCRINE: No polyuria or nocturia. No heat or cold intolerance.  HEMATOLOGY: No anemia. No bruising. No bleeding.  INTEGUMENTARY: No rashes. No lesions.  MUSCULOSKELETAL: Positive back pain NEUROLOGIC: No numbness, tingling, or ataxia. No seizure-type activity.  PSYCHIATRIC: No anxiety. No insomnia. No ADD.    Vitals:   Vitals:   06/25/17 1100 06/25/17 1200 06/25/17 1300 06/25/17 1330  BP: 105/69 90/73 116/86 118/90  Pulse:    86  Resp: 14 (!) 22 (!) 26 18  Temp:    98.3 F (36.8 C)  TempSrc:    Oral  SpO2:    96%  Weight:      Height:        Wt Readings from Last 3 Encounters:  06/24/17 216 lb 8.6 oz (98.2 kg)  06/19/17 221 lb 6.4 oz (100.4 kg)  06/08/17 220 lb (99.8 kg)     Intake/Output Summary (Last 24 hours) at 06/25/17 1523 Last data filed at 06/25/17 0800  Gross per 24 hour  Intake             79.92 ml  Output              625 ml  Net          -545.08 ml    Physical Exam:   GENERAL: Pleasant-appearing in no apparent distress.  HEAD, EYES, EARS, NOSE AND THROAT: Atraumatic, normocephalic. Extraocular muscles are intact. Pupils equal and reactive to light. Sclerae anicteric. No conjunctival injection. No oro-pharyngeal erythema.  NECK: Supple. There is no jugular venous  distention. No bruits, no lymphadenopathy, no thyromegaly.  HEART: Irregularly regular. No murmurs, no rubs, no clicks.  LUNGS: Clear to auscultation bilaterally. No rales or rhonchi. No wheezes.  ABDOMEN: Soft, flat, nontender, nondistended. Has good bowel sounds. No hepatosplenomegaly appreciated.  EXTREMITIES: No evidence of any cyanosis, clubbing, or peripheral edema.  +2 pedal and radial pulses bilaterally.  NEUROLOGIC: The patient is alert, awake, and oriented x3 with no focal motor or sensory deficits appreciated bilaterally.  SKIN: Moist and warm with no rashes appreciated.  Psych: Not anxious, depressed LN: No inguinal LN enlargement    Antibiotics   Anti-infectives    Start     Dose/Rate Route Frequency Ordered Stop   06/26/17 1300  ceFAZolin (ANCEF) IVPB 2g/100 mL premix     2 g 200 mL/hr over 30 Minutes Intravenous  Once 06/25/17 1453        Medications   Scheduled Meds: . diltiazem  180 mg Oral Daily  . docusate sodium  100 mg Oral BID  . furosemide  20 mg Intravenous Q12H  . nicotine  14 mg Transdermal Once   Continuous Infusions: . [START ON 06/26/2017]  ceFAZolin (ANCEF) IV     PRN Meds:.acetaminophen **OR** [DISCONTINUED] acetaminophen, bisacodyl, iopamidol, ketorolac, ondansetron **OR** ondansetron (ZOFRAN) IV, oxyCODONE, polyethylene glycol   Data Review:   Micro Results Recent Results (from the past 240 hour(s))  Urine Culture     Status: None   Collection Time: 06/19/17 12:47 PM  Result Value Ref Range Status   Organism ID, Bacteria NO GROWTH  Final  MRSA PCR Screening      Status: None   Collection Time: 06/24/17 10:48 PM  Result Value Ref Range Status   MRSA by PCR NEGATIVE NEGATIVE Final    Comment:        The GeneXpert MRSA Assay (FDA approved for NASAL specimens only), is one component of a comprehensive MRSA colonization surveillance program. It is not intended to diagnose MRSA infection nor to guide or monitor treatment for MRSA infections.     Radiology Reports Dg Lumbar Spine 2-3 Views  Result Date: 06/24/2017 CLINICAL DATA:  Low back pain.  No reported injury. EXAM: LUMBAR SPINE - 2-3 VIEW COMPARISON:  None. FINDINGS: This report assumes 5 non rib-bearing lumbar vertebrae. Lumbar vertebral body heights are preserved, with no fracture. Mild degenerative disc disease at L4-5. Moderate degenerative disc disease at L5-S1. No spondylolisthesis. Mild facet arthropathy in the lower lumbar spine. No aggressive appearing focal osseous lesions. Abdominal aortic atherosclerosis. Large focus of soft tissue prominence in the back at the level of the thoracolumbar junction. IMPRESSION: 1. No lumbar spine fracture or spondylolisthesis . 2. Mild-to-moderate lower lumbar degenerative disc disease, most prominent at L5-S1 . 3. Nonspecific large focus of soft tissue prominence in the back at the level of the thoracolumbar junction, correlate with clinical exam . 4.  Aortic Atherosclerosis (ICD10-I70.0). Electronically Signed   By: Ilona Sorrel M.D.   On: 06/24/2017 15:25   Ct Abdomen Pelvis W Contrast  Result Date: 06/24/2017 CLINICAL DATA:  Low back pain, worsening for several weeks. EXAM: CT ABDOMEN AND PELVIS WITH CONTRAST TECHNIQUE: Multidetector CT imaging of the abdomen and pelvis was performed using the standard protocol following bolus administration of intravenous contrast. CONTRAST:  131mL ISOVUE-300 IOPAMIDOL (ISOVUE-300) INJECTION 61% COMPARISON:  06/24/2017 lumbar spine radiographs. FINDINGS: Lower chest: No significant pulmonary nodules or acute  consolidative airspace disease. Hepatobiliary: Normal liver size. Diffuse hepatic steatosis. Simple 1.2 cm lateral segment left liver lobe  cyst. Three additional scattered subcentimeter hypodense liver lesions, too small to characterize. No additional liver lesions. No definite liver surface irregularity. Normal gallbladder with no radiopaque cholelithiasis. No biliary ductal dilatation. Pancreas: Normal, with no mass or duct dilation. Spleen: Normal size. No mass. Adrenals/Urinary Tract: Right adrenal 1.1 cm nodule (series 2/ image 33) with indeterminate density 65 HU. No left adrenal nodules. No hydronephrosis. Subcentimeter hypodense renal cortical lesions in the upper kidneys bilaterally are too small to characterize and require no further follow-up. Normal bladder. Stomach/Bowel: Grossly normal stomach. Normal caliber small bowel with no small bowel wall thickening. Normal appendix. Minimal sigmoid diverticulosis, with no large bowel wall thickening or pericolonic fat stranding. Vascular/Lymphatic: Atherosclerotic abdominal aorta with 2.8 cm ectatic infrarenal abdominal aorta. Patent portal, splenic and renal veins. No pathologically enlarged lymph nodes in the abdomen or pelvis. Reproductive: Moderately enlarged prostate with nonspecific internal prostatic calcifications. Other: No pneumoperitoneum, ascites or focal fluid collection. Small fat containing right inguinal hernia. Small fat containing periumbilical hernia. Musculoskeletal: No aggressive appearing focal osseous lesions. Mild compression fracture of the L5 vertebral body with minimal 10% loss of vertebral body height. No bony retropulsion. No additional fracture. Mild to moderate thoracolumbar spondylosis. IMPRESSION: 1. Mild L5 vertebral body compression fracture, which appears acute. No aggressive appearing focal osseous lesions. 2. Indeterminate right adrenal nodule. If the patient has a history of malignancy, adrenal protocol CT or MRI abdomen  without and with IV contrast may be obtained at this time for further evaluation. Otherwise, a follow-up adrenal protocol CT abdomen may be obtained in 12 months. 3. Aortic atherosclerosis. 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 Ectatic abdominal aorta at risk for aneurysm development. Recommend followup by ultrasound in 5 years. This recommendation follows ACR consensus guidelines: [name]. J Am Coll Radiol 2013; 10:789-794. 4. Additional findings include diffuse hepatic steatosis, minimal sigmoid diverticulosis, moderately enlarged prostate and small fat containing right inguinal and periumbilical hernias. These results were called by telephone at the time of interpretation on 06/24/2017 at 6:46 pm to Dr. Jimmye Norman, who verbally acknowledged these results. Electronically Signed   By: Ilona Sorrel M.D.   On: 06/24/2017 18:49   Dg Chest Port 1 View  Result Date: 06/25/2017 CLINICAL DATA:  Severe back pain on admission yesterday. EXAM: PORTABLE CHEST 1 VIEW COMPARISON:  Chest CT dated 05/02/2017. FINDINGS: Borderline enlarged cardiac silhouette. Prominent pulmonary vasculature and interstitial markings. The lungs are mildly hyperexpanded. Lower thoracic spine degenerative changes. IMPRESSION: Borderline cardiomegaly and mild pulmonary vascular congestion with underlying COPD. Electronically Signed   By: Claudie Revering M.D.   On: 06/25/2017 07:51     CBC  Recent Labs Lab 06/24/17 1528 06/24/17 2248  WBC 13.7* 13.8*  HGB 17.5 17.0  HCT 52.1* 51.0  PLT 225 227  MCV 90.4 89.6  MCH 30.4 29.8  MCHC 33.6 33.2  RDW 14.9* 14.7*  LYMPHSABS 1.7  --   MONOABS 1.4*  --   EOSABS 0.1  --   BASOSABS 0.1  --     Chemistries   Recent Labs Lab 06/24/17 1528 06/24/17 2248  NA 135 133*  K 3.4* 3.2*  CL 98* 97*  CO2 24 25  GLUCOSE 104* 133*  BUN 13 14  CREATININE 1.14 1.05  CALCIUM 9.3 9.0  MG 1.8 1.7  AST 24  --   ALT 11*  --    ALKPHOS 76  --   BILITOT 1.7*  --    ------------------------------------------------------------------------------------------------------------------  estimated creatinine clearance is 71.3 mL/min (by C-G formula based on SCr of 1.05 mg/dL). ------------------------------------------------------------------------------------------------------------------ No results for input(s): HGBA1C in the last 72 hours. ------------------------------------------------------------------------------------------------------------------ No results for input(s): CHOL, HDL, LDLCALC, TRIG, CHOLHDL, LDLDIRECT in the last 72 hours. ------------------------------------------------------------------------------------------------------------------  Recent Labs  06/24/17 1528  TSH 1.528   ------------------------------------------------------------------------------------------------------------------ No results for input(s): VITAMINB12, FOLATE, FERRITIN, TIBC, IRON, RETICCTPCT in the last 72 hours.  Coagulation profile No results for input(s): INR, PROTIME in the last 168 hours.  No results for input(s): DDIMER in the last 72 hours.  Cardiac Enzymes  Recent Labs Lab 06/24/17 1528 06/24/17 2248  TROPONINI <0.03 <0.03   ------------------------------------------------------------------------------------------------------------------ Invalid input(s): POCBNP    Assessment & Plan   Ricardo Erickson  is a 76 y.o. male with a known history of Chronic back pain and Emphysema along with ongoing tobacco abuse, hypertension, depression comes to the emergency room accompanied by son and daughter-in-law after patient started having increasing back pain for last couple days. He was seen as outpatient by Dr. Sabra Heck his primary care physician and given his problems with prostate enlargement and PSA was done which was reported as 10.33. Patient was to follow-up and get a CT of the abdomen. He started having  excruciating lower back pain and brought to the emergency room where CT scan of the abdomen showed L5 compression fracture with about 10% height loss.  1. A. fib with RVR new onset -Heart rate now improved Echocardiogram pending Continue therapy with Cardizem Will likely need anticoagulation was just   2. Acute on chronic back pain due to L5 compression fracture -When necessary pain meds -Appreciate orthopedic consult plan for kyphoplasty -PT consult  3. Bilateral lower extremity edema-new -Echo of the heart - Chest x-ray with possible CHF will give him low-dose IV Lasix   4. COPD/emphysema with ongoing tobacco abuse -When necessary nebs and inhalers  5. Elevated PSA levels to 10.33 -Patient has history of prostate enlargement on CT renal study -He will need to follow up with urology for further workup as outpatient  6. Nicotine abuse smoking cessation provided 4 minutes spent recommend he stop smoking     Code Status Orders        Start     Ordered   06/24/17 2236  Full code  Continuous     06/24/17 2235    Code Status History    Date Active Date Inactive Code Status Order ID Comments User Context   This patient has a current code status but no historical code status.           Consults ortho ,cards   DVT Prophylaxis  Lovenox   Lab Results  Component Value Date   PLT 227 06/24/2017     Time Spent in minutes  13min  Greater than 50% of time spent in care coordination and counseling patient regarding the condition and plan of care.   Dustin Flock M.D on 06/25/2017 at 3:23 PM  Between 7am to 6pm - Pager - [phone]  After 6pm go to [email or web address] - password [email or web address]   Office  [phone]

## 2017-06-25 NOTE — Progress Notes (Signed)
This nurse spoke with patient's daughter at bedside in regards to kyphoplasty for tomorrow with dr Rudene Christians. Daughter states that son (who is the POA) will be here tomorrow morning to talk with doctor before signing consent. Patient is intermittently confused.

## 2017-06-26 ENCOUNTER — Encounter: Payer: Self-pay | Admitting: *Deleted

## 2017-06-26 ENCOUNTER — Encounter: Payer: Self-pay | Admitting: Anesthesiology

## 2017-06-26 ENCOUNTER — Inpatient Hospital Stay: Payer: Medicare Other

## 2017-06-26 ENCOUNTER — Inpatient Hospital Stay
Admit: 2017-06-26 | Discharge: 2017-06-26 | Disposition: A | Discharge status: Home / Self Care | Payer: Medicare Other | Attending: Internal Medicine | Admitting: Internal Medicine

## 2017-06-26 ENCOUNTER — Encounter
Admission: EM | Disposition: A | Discharge status: Skilled Nursing Facility | Payer: Self-pay | Source: Home / Self Care | Attending: Internal Medicine

## 2017-06-26 ENCOUNTER — Ambulatory Visit
Admission: RE | Admit: 2017-06-26 | Discharge: 2017-06-26 | Disposition: A | Discharge status: Home / Self Care | Payer: Medicare Other | Source: Ambulatory Visit | Attending: Internal Medicine | Admitting: Internal Medicine

## 2017-06-26 DIAGNOSIS — R4182 Altered mental status, unspecified: Secondary | ICD-10-CM

## 2017-06-26 DIAGNOSIS — G9340 Encephalopathy, unspecified: Secondary | ICD-10-CM

## 2017-06-26 DIAGNOSIS — I481 Persistent atrial fibrillation: Secondary | ICD-10-CM

## 2017-06-26 DIAGNOSIS — J9601 Acute respiratory failure with hypoxia: Secondary | ICD-10-CM

## 2017-06-26 LAB — CBC
HEMATOCRIT: 54.7 % — AB (ref 40.0–52.0)
Hemoglobin: 18.8 g/dL — ABNORMAL HIGH (ref 13.0–18.0)
MCH: 30.5 pg (ref 26.0–34.0)
MCHC: 34.4 g/dL (ref 32.0–36.0)
MCV: 88.5 fL (ref 80.0–100.0)
Platelets: 242 10*3/uL (ref 150–440)
RBC: 6.18 MIL/uL — AB (ref 4.40–5.90)
RDW: 14.6 % — ABNORMAL HIGH (ref 11.5–14.5)
WBC: 17.7 10*3/uL — AB (ref 3.8–10.6)

## 2017-06-26 LAB — HEPATIC FUNCTION PANEL
ALBUMIN: 3.7 g/dL (ref 3.5–5.0)
ALK PHOS: 79 U/L (ref 38–126)
ALT: 12 U/L — AB (ref 17–63)
AST: 38 U/L (ref 15–41)
Bilirubin, Direct: 0.2 mg/dL (ref 0.1–0.5)
Indirect Bilirubin: 1.1 mg/dL — ABNORMAL HIGH (ref 0.3–0.9)
TOTAL PROTEIN: 6.5 g/dL (ref 6.5–8.1)
Total Bilirubin: 1.3 mg/dL — ABNORMAL HIGH (ref 0.3–1.2)

## 2017-06-26 LAB — BASIC METABOLIC PANEL
ANION GAP: 13 (ref 5–15)
BUN: 15 mg/dL (ref 6–20)
CHLORIDE: 99 mmol/L — AB (ref 101–111)
CO2: 25 mmol/L (ref 22–32)
Calcium: 9.6 mg/dL (ref 8.9–10.3)
Creatinine, Ser: 1.18 mg/dL (ref 0.61–1.24)
GFR calc Af Amer: >60 mL/min (ref >60)
GFR calc non Af Amer: 59 mL/min — ABNORMAL LOW (ref >60)
GLUCOSE: 118 mg/dL — AB (ref 65–99)
POTASSIUM: 3.7 mmol/L (ref 3.5–5.1)
Sodium: 137 mmol/L (ref 135–145)

## 2017-06-26 LAB — URINE CULTURE
CULTURE: NO GROWTH
SPECIAL REQUESTS: NORMAL

## 2017-06-26 LAB — PROCALCITONIN

## 2017-06-26 LAB — GLUCOSE, CAPILLARY: Glucose-Capillary: 127 mg/dL — ABNORMAL HIGH (ref 65–99)

## 2017-06-26 LAB — AMMONIA: Ammonia: 15 umol/L (ref 9–35)

## 2017-06-26 SURGERY — KYPHOPLASTY
Anesthesia: Choice

## 2017-06-26 MED ORDER — DEXMEDETOMIDINE HCL IN NACL 400 MCG/100ML IV SOLN
0.4000 ug/kg/h | INTRAVENOUS | Status: DC
  Administered 2017-06-26 (×2): 0.4 ug/kg/h via INTRAVENOUS
  Administered 2017-06-26: 0.8 ug/kg/h via INTRAVENOUS
  Administered 2017-06-27: 0.4 ug/kg/h via INTRAVENOUS
  Administered 2017-06-27: 0.7 ug/kg/h via INTRAVENOUS
  Administered 2017-06-27: 0.8 ug/kg/h via INTRAVENOUS
  Administered 2017-06-28: 0.5 ug/kg/h via INTRAVENOUS
  Filled 2017-06-26 (×7): qty 100

## 2017-06-26 MED ORDER — HALOPERIDOL LACTATE 5 MG/ML IJ SOLN: 1.0000 mg | Freq: Once | INTRAMUSCULAR | Status: DC

## 2017-06-26 MED ORDER — MIDAZOLAM HCL 2 MG/2ML IJ SOLN
INTRAMUSCULAR | Status: AC
  Administered 2017-06-26: 2 mg via INTRAVENOUS
  Filled 2017-06-26: qty 2

## 2017-06-26 MED ORDER — DILTIAZEM HCL ER COATED BEADS 120 MG PO CP24
240.0000 mg | ORAL_CAPSULE | Freq: Every day | ORAL | Status: DC
  Administered 2017-06-28 – 2017-06-29 (×2): 240 mg via ORAL
  Filled 2017-06-26 (×3): qty 1
  Filled 2017-06-26: qty 2

## 2017-06-26 MED ORDER — LORAZEPAM 2 MG/ML IJ SOLN
1.0000 mg | Freq: Once | INTRAMUSCULAR | Status: AC
  Administered 2017-06-26: 1 mg via INTRAVENOUS
  Filled 2017-06-26: qty 1

## 2017-06-26 MED ORDER — BUSPIRONE HCL 5 MG PO TABS
7.5000 mg | ORAL_TABLET | Freq: Two times a day (BID) | ORAL | Status: DC
  Administered 2017-06-26 – 2017-06-28 (×3): 7.5 mg via ORAL
  Filled 2017-06-26 (×7): qty 1.5

## 2017-06-26 MED ORDER — METOPROLOL TARTRATE 5 MG/5ML IV SOLN
5.0000 mg | Freq: Once | INTRAVENOUS | Status: AC
Start: 2017-06-26 — End: 2017-06-26
  Administered 2017-06-26: 5 mg via INTRAVENOUS
  Filled 2017-06-26: qty 5

## 2017-06-26 MED ORDER — MORPHINE SULFATE (PF) 2 MG/ML IV SOLN: 2.0000 mg | INTRAVENOUS | Status: DC | PRN

## 2017-06-26 MED ORDER — PRAMIPEXOLE DIHYDROCHLORIDE 1 MG PO TABS
1.0000 mg | ORAL_TABLET | Freq: Once | ORAL | Status: AC
  Administered 2017-06-26: 1 mg via ORAL

## 2017-06-26 MED ORDER — HALOPERIDOL LACTATE 5 MG/ML IJ SOLN
2.0000 mg | Freq: Once | INTRAMUSCULAR | Status: AC
  Administered 2017-06-26: 2 mg via INTRAVENOUS
  Filled 2017-06-26: qty 1

## 2017-06-26 MED ORDER — AMIODARONE HCL IN DEXTROSE 360-4.14 MG/200ML-% IV SOLN
30.0000 mg/h | INTRAVENOUS | Status: DC
  Administered 2017-06-26 – 2017-06-27 (×4): 30 mg/h via INTRAVENOUS
  Filled 2017-06-26 (×4): qty 200

## 2017-06-26 MED ORDER — SODIUM CHLORIDE 0.9 % IV SOLN: 0.4000 ug/kg/h | INTRAVENOUS | Status: DC

## 2017-06-26 MED ORDER — MIDAZOLAM HCL 2 MG/2ML IJ SOLN
2.0000 mg | INTRAMUSCULAR | Status: AC
  Administered 2017-06-26: 2 mg via INTRAVENOUS

## 2017-06-26 MED ORDER — MORPHINE SULFATE (PF) 2 MG/ML IV SOLN
2.0000 mg | INTRAVENOUS | Status: AC
  Administered 2017-06-26: 2 mg via INTRAVENOUS

## 2017-06-26 MED ORDER — SODIUM CHLORIDE 0.9 % IV SOLN: INTRAVENOUS | Status: DC

## 2017-06-26 MED ORDER — MORPHINE SULFATE (PF) 2 MG/ML IV SOLN
INTRAVENOUS | Status: AC
  Administered 2017-06-26: 2 mg via INTRAVENOUS
  Filled 2017-06-26: qty 1

## 2017-06-26 MED ORDER — HALOPERIDOL LACTATE 5 MG/ML IJ SOLN
2.0000 mg | Freq: Four times a day (QID) | INTRAMUSCULAR | Status: DC | PRN
  Administered 2017-06-26: 2 mg via INTRAVENOUS
  Filled 2017-06-26: qty 1

## 2017-06-26 MED ORDER — ALBUTEROL SULFATE (2.5 MG/3ML) 0.083% IN NEBU: 2.5000 mg | INHALATION_SOLUTION | RESPIRATORY_TRACT | Status: DC | PRN

## 2017-06-26 MED ORDER — ZIPRASIDONE MESYLATE 20 MG IM SOLR
10.0000 mg | Freq: Once | INTRAMUSCULAR | Status: AC
  Administered 2017-06-26: 10 mg via INTRAMUSCULAR
  Filled 2017-06-26: qty 20

## 2017-06-26 MED ORDER — MIDAZOLAM HCL 2 MG/2ML IJ SOLN
2.0000 mg | INTRAMUSCULAR | Status: DC | PRN
  Administered 2017-06-26 – 2017-06-27 (×2): 2 mg via INTRAVENOUS
  Filled 2017-06-26 (×2): qty 2

## 2017-06-26 MED ORDER — AMIODARONE HCL IN DEXTROSE 360-4.14 MG/200ML-% IV SOLN
60.0000 mg/h | INTRAVENOUS | Status: AC
  Administered 2017-06-26: 60 mg/h via INTRAVENOUS
  Filled 2017-06-26: qty 200

## 2017-06-26 MED ORDER — PRAMIPEXOLE DIHYDROCHLORIDE 1 MG PO TABS
1.0000 mg | ORAL_TABLET | Freq: Every day | ORAL | Status: DC
  Administered 2017-06-26 – 2017-06-29 (×4): 1 mg via ORAL
  Filled 2017-06-26 (×5): qty 1

## 2017-06-26 MED ORDER — MOMETASONE FURO-FORMOTEROL FUM 200-5 MCG/ACT IN AERO
2.0000 | INHALATION_SPRAY | Freq: Two times a day (BID) | RESPIRATORY_TRACT | Status: DC
  Administered 2017-06-27 – 2017-06-29 (×5): 2 via RESPIRATORY_TRACT
  Filled 2017-06-26 (×2): qty 8.8

## 2017-06-26 SURGICAL SUPPLY — 15 items
DERMABOND ADVANCED (GAUZE/BANDAGES/DRESSINGS) ×2
DERMABOND ADVANCED .7 DNX12 (GAUZE/BANDAGES/DRESSINGS) ×1 IMPLANT
DEVICE BIOPSY BONE KYPHX (INSTRUMENTS) ×3 IMPLANT
DRAPE C-ARM XRAY 36X54 (DRAPES) ×3 IMPLANT
DURAPREP 26ML APPLICATOR (WOUND CARE) ×3 IMPLANT
GLOVE SURG SYN 9.0  PF PI (GLOVE) ×2
GLOVE SURG SYN 9.0 PF PI (GLOVE) ×1 IMPLANT
GOWN SRG 2XL LVL 4 RGLN SLV (GOWNS) ×1 IMPLANT
GOWN STRL NON-REIN 2XL LVL4 (GOWNS) ×2
GOWN STRL REUS W/ TWL LRG LVL3 (GOWN DISPOSABLE) ×1 IMPLANT
GOWN STRL REUS W/TWL LRG LVL3 (GOWN DISPOSABLE) ×2
PACK KYPHOPLASTY (MISCELLANEOUS) ×3 IMPLANT
STRAP SAFETY BODY (MISCELLANEOUS) ×3 IMPLANT
TRAY KYPHOPAK 15/3 EXPRESS 1ST (MISCELLANEOUS) ×3 IMPLANT
TRAY KYPHOPAK 20/3 EXPRESS 1ST (MISCELLANEOUS) ×3 IMPLANT

## 2017-06-26 NOTE — Progress Notes (Signed)
[place] at [facility]                                                                                                                                                                                  Patient Demographics   Ricardo Erickson, is a 76 y.o. male, DOB - 07/28/1941, YKZ:[id number]  Admit date - 06/24/2017   Admitting Physician Fritzi Mandes, MD  Outpatient Primary MD for the patient is Rusty Aus, MD   LOS - 2  Subjective: Patient transferred to the ICU because he got severely agitated and was in severe pain now A. fib with RVR   Review of Systems:   CONSTITUTIONAL:Patient confused  Vitals:   Vitals:   06/26/17 1045 06/26/17 1100 06/26/17 1115 06/26/17 1130  BP: (!) 80/61 (!) 79/60 (!) 85/61 (!) 81/65  Pulse: 72 62 65 75  Resp: 18 19 (!) 21 14  Temp:    (!) 96.5 F (35.8 C)  TempSrc:    Axillary  SpO2: 98% 96% 100% 98%  Weight:      Height:        Wt Readings from Last 3 Encounters:  06/24/17 216 lb 8.6 oz (98.2 kg)  06/19/17 221 lb 6.4 oz (100.4 kg)  06/08/17 220 lb (99.8 kg)     Intake/Output Summary (Last 24 hours) at 06/26/17 1501 Last data filed at 06/26/17 1100  Gross per 24 hour  Intake           208.78 ml  Output             2825 ml  Net         -2616.22 ml    Physical Exam:   GENERAL: Currently on Precedex HEAD, EYES, EARS, NOSE AND THROAT: Atraumatic, normocephalic. Extraocular muscles are intact. Pupils equal and reactive to light. Sclerae anicteric. No conjunctival injection. No oro-pharyngeal erythema.  NECK: Supple. There is no jugular venous distention. No bruits, no lymphadenopathy, no thyromegaly.  HEART: Irregularly regular. No murmurs, no rubs, no clicks.  LUNGS: Clear to auscultation bilaterally. No rales or rhonchi. No wheezes.  ABDOMEN: Soft, flat, nontender, nondistended. Has good bowel sounds. No hepatosplenomegaly appreciated.  EXTREMITIES: No evidence of any cyanosis, clubbing, or peripheral edema.   +2 pedal and radial pulses bilaterally.  NEUROLOGIC: Sedated  SKIN: Moist and warm with no rashes appreciated.  Psych: Sedated LN: No inguinal LN enlargement    Antibiotics   Anti-infectives    Start     Dose/Rate Route Frequency Ordered Stop   06/26/17 1300  ceFAZolin (ANCEF) IVPB 2g/100 mL premix     2 g 200 mL/hr over 30 Minutes Intravenous  Once 06/25/17  1453 06/26/17 1356      Medications   Scheduled Meds: . busPIRone  7.5 mg Oral BID  . diltiazem  240 mg Oral Daily  . docusate sodium  100 mg Oral BID  . furosemide  20 mg Intravenous Q12H  . haloperidol lactate  1 mg Intravenous Once  . mometasone-formoterol  2 puff Inhalation BID  . pramipexole  1 mg Oral QHS   Continuous Infusions: . amiodarone 30 mg/hr (06/26/17 0932)  . dexmedetomidine 0.1 mcg/kg/hr (06/26/17 1130)   PRN Meds:.acetaminophen **OR** [DISCONTINUED] acetaminophen, albuterol, bisacodyl, iopamidol, ketorolac, midazolam, morphine injection, ondansetron **OR** ondansetron (ZOFRAN) IV, oxyCODONE, polyethylene glycol   Data Review:   Micro Results Recent Results (from the past 240 hour(s))  Urine Culture     Status: None   Collection Time: 06/19/17 12:47 PM  Result Value Ref Range Status   Organism ID, Bacteria NO GROWTH  Final  MRSA PCR Screening     Status: None   Collection Time: 06/24/17 10:48 PM  Result Value Ref Range Status   MRSA by PCR NEGATIVE NEGATIVE Final    Comment:        The GeneXpert MRSA Assay (FDA approved for NASAL specimens only), is one component of a comprehensive MRSA colonization surveillance program. It is not intended to diagnose MRSA infection nor to guide or monitor treatment for MRSA infections.   Urine Culture     Status: None   Collection Time: 06/24/17 11:14 PM  Result Value Ref Range Status   Specimen Description URINE, CLEAN CATCH  Final   Special Requests Normal  Final   Culture   Final    NO GROWTH Performed at [facility], [address], [place],  [address]    Report Status 06/26/2017 FINAL  Final    Radiology Reports Dg Lumbar Spine 2-3 Views  Result Date: 06/24/2017 CLINICAL DATA:  Low back pain.  No reported injury. EXAM: LUMBAR SPINE - 2-3 VIEW COMPARISON:  None. FINDINGS: This report assumes 5 non rib-bearing lumbar vertebrae. Lumbar vertebral body heights are preserved, with no fracture. Mild degenerative disc disease at L4-5. Moderate degenerative disc disease at L5-S1. No spondylolisthesis. Mild facet arthropathy in the lower lumbar spine. No aggressive appearing focal osseous lesions. Abdominal aortic atherosclerosis. Large focus of soft tissue prominence in the back at the level of the thoracolumbar junction. IMPRESSION: 1. No lumbar spine fracture or spondylolisthesis . 2. Mild-to-moderate lower lumbar degenerative disc disease, most prominent at L5-S1 . 3. Nonspecific large focus of soft tissue prominence in the back at the level of the thoracolumbar junction, correlate with clinical exam . 4.  Aortic Atherosclerosis (ICD10-I70.0). Electronically Signed   By: Ilona Sorrel M.D.   On: 06/24/2017 15:25   Ct Head Wo Contrast  Result Date: 06/26/2017 CLINICAL DATA:  Confusion, agitation EXAM: CT HEAD WITHOUT CONTRAST TECHNIQUE: Contiguous axial images were obtained from the base of the skull through the vertex without intravenous contrast. COMPARISON:  None. FINDINGS: Brain: No evidence of acute infarction, hemorrhage, extra-axial collection, ventriculomegaly, or mass effect. Generalized cerebral atrophy. Periventricular white matter low attenuation likely secondary to microangiopathy. Vascular: Cerebrovascular atherosclerotic calcifications are noted. Skull: Negative for fracture or focal lesion. Sinuses/Orbits: Visualized portions of the orbits are unremarkable. Visualized portions of the paranasal sinuses and mastoid air cells are unremarkable. Other: None. IMPRESSION: 1. No acute intracranial pathology. 2. Chronic  microvascular disease and cerebral atrophy. Electronically Signed   By: Kathreen Devoid   On: 06/26/2017 09:57   Ct  Abdomen Pelvis W Contrast  Result Date: 06/24/2017 CLINICAL DATA:  Low back pain, worsening for several weeks. EXAM: CT ABDOMEN AND PELVIS WITH CONTRAST TECHNIQUE: Multidetector CT imaging of the abdomen and pelvis was performed using the standard protocol following bolus administration of intravenous contrast. CONTRAST:  118mL ISOVUE-300 IOPAMIDOL (ISOVUE-300) INJECTION 61% COMPARISON:  06/24/2017 lumbar spine radiographs. FINDINGS: Lower chest: No significant pulmonary nodules or acute consolidative airspace disease. Hepatobiliary: Normal liver size. Diffuse hepatic steatosis. Simple 1.2 cm lateral segment left liver lobe cyst. Three additional scattered subcentimeter hypodense liver lesions, too small to characterize. No additional liver lesions. No definite liver surface irregularity. Normal gallbladder with no radiopaque cholelithiasis. No biliary ductal dilatation. Pancreas: Normal, with no mass or duct dilation. Spleen: Normal size. No mass. Adrenals/Urinary Tract: Right adrenal 1.1 cm nodule (series 2/ image 33) with indeterminate density 65 HU. No left adrenal nodules. No hydronephrosis. Subcentimeter hypodense renal cortical lesions in the upper kidneys bilaterally are too small to characterize and require no further follow-up. Normal bladder. Stomach/Bowel: Grossly normal stomach. Normal caliber small bowel with no small bowel wall thickening. Normal appendix. Minimal sigmoid diverticulosis, with no large bowel wall thickening or pericolonic fat stranding. Vascular/Lymphatic: Atherosclerotic abdominal aorta with 2.8 cm ectatic infrarenal abdominal aorta. Patent portal, splenic and renal veins. No pathologically enlarged lymph nodes in the abdomen or pelvis. Reproductive: Moderately enlarged prostate with nonspecific internal prostatic calcifications. Other: No pneumoperitoneum, ascites or  focal fluid collection. Small fat containing right inguinal hernia. Small fat containing periumbilical hernia. Musculoskeletal: No aggressive appearing focal osseous lesions. Mild compression fracture of the L5 vertebral body with minimal 10% loss of vertebral body height. No bony retropulsion. No additional fracture. Mild to moderate thoracolumbar spondylosis. IMPRESSION: 1. Mild L5 vertebral body compression fracture, which appears acute. No aggressive appearing focal osseous lesions. 2. Indeterminate right adrenal nodule. If the patient has a history of malignancy, adrenal protocol CT or MRI abdomen without and with IV contrast may be obtained at this time for further evaluation. Otherwise, a follow-up adrenal protocol CT abdomen may be obtained in 12 months. 3. Aortic atherosclerosis. 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 Ectatic abdominal aorta at risk for aneurysm development. Recommend followup by ultrasound in 5 years. This recommendation follows ACR consensus guidelines: [name]. J Am Coll Radiol 2013; 10:789-794. 4. Additional findings include diffuse hepatic steatosis, minimal sigmoid diverticulosis, moderately enlarged prostate and small fat containing right inguinal and periumbilical hernias. These results were called by telephone at the time of interpretation on 06/24/2017 at 6:46 pm to Dr. Jimmye Norman, who verbally acknowledged these results. Electronically Signed   By: Ilona Sorrel M.D.   On: 06/24/2017 18:49   Dg Chest Port 1 View  Result Date: 06/25/2017 CLINICAL DATA:  Severe back pain on admission yesterday. EXAM: PORTABLE CHEST 1 VIEW COMPARISON:  Chest CT dated 05/02/2017. FINDINGS: Borderline enlarged cardiac silhouette. Prominent pulmonary vasculature and interstitial markings. The lungs are mildly hyperexpanded. Lower thoracic spine degenerative changes. IMPRESSION: Borderline cardiomegaly and mild pulmonary vascular  congestion with underlying COPD. Electronically Signed   By: Claudie Revering M.D.   On: 06/25/2017 07:51     CBC  Recent Labs Lab 06/24/17 1528 06/24/17 2248 06/26/17 0553  WBC 13.7* 13.8* 17.7*  HGB 17.5 17.0 18.8*  HCT 52.1* 51.0 54.7*  PLT 225 227 242  MCV 90.4 89.6 88.5  MCH 30.4 29.8 30.5  MCHC 33.6 33.2 34.4  RDW 14.9* 14.7* 14.6*  LYMPHSABS  1.7  --   --   MONOABS 1.4*  --   --   EOSABS 0.1  --   --   BASOSABS 0.1  --   --     Chemistries   Recent Labs Lab 06/24/17 1528 06/24/17 2248 06/26/17 0553 06/26/17 0906  NA 135 133* 137  --   K 3.4* 3.2* 3.7  --   CL 98* 97* 99*  --   CO2 24 25 25   --   GLUCOSE 104* 133* 118*  --   BUN 13 14 15   --   CREATININE 1.14 1.05 1.18  --   CALCIUM 9.3 9.0 9.6  --   MG 1.8 1.7  --   --   AST 24  --   --  38  ALT 11*  --   --  12*  ALKPHOS 76  --   --  79  BILITOT 1.7*  --   --  1.3*   ------------------------------------------------------------------------------------------------------------------ estimated creatinine clearance is 63.5 mL/min (by C-G formula based on SCr of 1.18 mg/dL). ------------------------------------------------------------------------------------------------------------------ No results for input(s): HGBA1C in the last 72 hours. ------------------------------------------------------------------------------------------------------------------ No results for input(s): CHOL, HDL, LDLCALC, TRIG, CHOLHDL, LDLDIRECT in the last 72 hours. ------------------------------------------------------------------------------------------------------------------  Recent Labs  06/24/17 1528  TSH 1.528   ------------------------------------------------------------------------------------------------------------------ No results for input(s): VITAMINB12, FOLATE, FERRITIN, TIBC, IRON, RETICCTPCT in the last 72 hours.  Coagulation profile No results for input(s): INR, PROTIME in the last 168 hours.  No results for  input(s): DDIMER in the last 72 hours.  Cardiac Enzymes  Recent Labs Lab 06/24/17 1528 06/24/17 2248  TROPONINI <0.03 <0.03   ------------------------------------------------------------------------------------------------------------------ Invalid input(s): POCBNP    Assessment & Plan   Adrion Menz  is a 76 y.o. male with a known history of Chronic back pain and Emphysema along with ongoing tobacco abuse, hypertension, depression comes to the emergency room accompanied by son and daughter-in-law after patient started having increasing back pain for last couple days. He was seen as outpatient by Dr. Sabra Heck his primary care physician and given his problems with prostate enlargement and PSA was done which was reported as 10.33. Patient was to follow-up and get a CT of the abdomen. He started having excruciating lower back pain and brought to the emergency room where CT scan of the abdomen showed L5 compression fracture with about 10% height loss.  1. A. fib with RVR new onset -Patient on amiodarone drip Echocardiogram pending Continue therapy with Cardizem  2. Acute encephalopathy stat CT head was done shows no acute abnormality suspect could be related to his pain neurology consult has been obtained   2. Acute on chronic back pain due to L5 compression fracture -When necessary pain meds -Appreciate orthopedic consult plan for kyphoplasty canceled today due to mental status -PT consult  3. Bilateral lower extremity edema-new -Echo of the heart - Chest x-ray with possible CHF echo pending   4. COPD/emphysema with ongoing tobacco abuse -When necessary nebs and inhalers  5. Elevated PSA levels to 10.33 -Patient has history of prostate enlargement on CT renal study -He will need to follow up with urology for further workup as outpatient  6. Nicotine abuse smoking cessation provided     Code Status Orders        Start     Ordered   06/24/17 2236  Full code   Continuous     06/24/17 2235    Code Status History    Date Active Date Inactive Code Status Order  ID Comments User Context   This patient has a current code status but no historical code status.           Consults ortho ,cards   DVT Prophylaxis  Lovenox   Lab Results  Component Value Date   PLT 242 06/26/2017     Time Spent in minutes  89min  Greater than 50% of time spent in care coordination and counseling patient regarding the condition and plan of care.   Dustin Flock M.D on 06/26/2017 at 3:01 PM  Between 7am to 6pm - Pager - [phone]  After 6pm go to [email or web address] - password [email or web address] [facility]   Office  [phone]

## 2017-06-26 NOTE — Consult Note (Signed)
Reason for Consult:AMS Referring Physician: Posey Pronto  CC: AMS  HPI: Ricardo Erickson is an 76 y.o. male who is currently sedated and unable to provide any history.  Son present and his history augments that noted in the chart.  Patient with a history of chronic back pain, HTN, depression and emphysema who presented to the ED with increasing back pain.  Work up revealed a L5 compression fracture.  Patient was admitted with kyphoplasty planned.  Patient early on became very restless and agitated.  Has had hallucinations and paranoia.  Developed afib with RVR as well.  Kyphoplasty unable to be performed secondary to mental status.  Per son this is not unusual for patient and the mental status noted is to be expected and will clear once the patient is in a more familiar setting and without the stressors of pain.    Past Medical History:  Diagnosis Date  . Chronic back pain   . Depression   . Emphysema lung ([place])   . Hypertension   . Personal history of tobacco use, presenting hazards to health 08/06/2015  . Pollen allergies   . PTSD (post-traumatic stress disorder)     Past Surgical History:  Procedure Laterality Date  . TONSILLECTOMY AND ADENOIDECTOMY  1980    Family History  Problem Relation Age of Onset  . Alcohol abuse Father   . Arthritis Mother   . Heart disease Maternal Grandmother   . Prostate cancer Brother     Social History:  reports that he has been smoking.  He has a 90.00 pack-year smoking history. He has never used smokeless tobacco. He reports that he does not drink alcohol or use drugs.  No Known Allergies  Medications:  I have reviewed the patient's current medications. Prior to Admission:  Prescriptions Prior to Admission  Medication Sig Dispense Refill Last Dose  . albuterol (PROVENTIL HFA;VENTOLIN HFA) 108 (90 Base) MCG/ACT inhaler Inhale 2 puffs into the lungs as needed for wheezing or shortness of breath.   Taking  . albuterol (PROVENTIL) (2.5 MG/3ML) 0.083%  nebulizer solution Inhale 2.5 mg into the lungs every 6 (six) hours as needed.    Taking  . busPIRone (BUSPAR) 7.5 MG tablet Take 1 tablet twice daily for anxiety. 180 tablet 0 06/24/2017 at Unknown time  . Fluticasone-Salmeterol (ADVAIR DISKUS) 250-50 MCG/DOSE AEPB Inhale 1 puff into the lungs daily. Reported on 04/01/2016   06/24/2017 at Unknown time  . oxyCODONE-acetaminophen (PERCOCET/ROXICET) 5-325 MG tablet Take 1-2 tablets by mouth every 4 (four) hours as needed for severe pain.   06/24/2017 at Unknown time  . pramipexole (MIRAPEX) 1 MG tablet Take 1 mg by mouth at bedtime.   06/23/2017 at Unknown time  . triamterene-hydrochlorothiazide (MAXZIDE-25) 37.5-25 MG tablet Take 1 tablet by mouth daily.   06/24/2017 at Unknown time   Scheduled: . busPIRone  7.5 mg Oral BID  . diltiazem  240 mg Oral Daily  . docusate sodium  100 mg Oral BID  . furosemide  20 mg Intravenous Q12H  . haloperidol lactate  1 mg Intravenous Once  . mometasone-formoterol  2 puff Inhalation BID  . pramipexole  1 mg Oral QHS    ROS: Patient unable to provide  Physical Examination: Blood pressure (!) 81/65, pulse 75, temperature (!) 96.5 F (35.8 C), temperature source Axillary, resp. rate 14, height 5\' 9"  (1.753 m), weight 98.2 kg (216 lb 8.6 oz), SpO2 98 %.  HEENT-  Normocephalic, no lesions, without obvious abnormality.  Normal external eye and  conjunctiva.  Normal TM's bilaterally.  Normal auditory canals and external ears. Normal external nose, mucus membranes and septum.  Normal pharynx. Cardiovascular- S1, S2 normal, pulses palpable throughout   Lungs- chest clear, no wheezing, rales, normal symmetric air entry Abdomen- soft, non-tender; bowel sounds normal; no masses,  no organomegaly Extremities- no edema Lymph-no adenopathy palpable Musculoskeletal-no joint tenderness, deformity or swelling Skin-warm and dry, no hyperpigmentation, vitiligo, or suspicious lesions  Neurological Examination  Patient s/p  Versed Mental Status: Patient does not respond to verbal stimuli.  Does not respond to deep sternal rub.  Does not follow commands.  No verbalizations noted.  Cranial Nerves: II: patient does not respond confrontation bilaterally, pupils right 2 mm, left 2 mm,and reactive bilaterally III,IV,VI: doll's response absent bilaterally.  V,VII: corneal reflex reduced bilaterally  VIII: patient does not respond to verbal stimuli IX,X: gag reflex reduced, XI: trapezius strength unable to test bilaterally XII: tongue strength unable to test Motor: Extremities flaccid throughout.  No spontaneous movement noted.  No purposeful movements noted. Sensory: Does not respond to noxious stimuli in any extremity. Deep Tendon Reflexes:  Absent throughout. Plantars: Mute bilaterally Cerebellar: Unable to perform   Laboratory Studies:   Basic Metabolic Panel:  Recent Labs Lab 06/24/17 1528 06/24/17 2248 06/26/17 0553  NA 135 133* 137  K 3.4* 3.2* 3.7  CL 98* 97* 99*  CO2 24 25 25   GLUCOSE 104* 133* 118*  BUN 13 14 15   CREATININE 1.14 1.05 1.18  CALCIUM 9.3 9.0 9.6  MG 1.8 1.7  --   PHOS  --  4.2  --     Liver Function Tests:  Recent Labs Lab 06/24/17 1528 06/26/17 0906  AST 24 38  ALT 11* 12*  ALKPHOS 76 79  BILITOT 1.7* 1.3*  PROT 6.9 6.5  ALBUMIN 3.8 3.7    Recent Labs Lab 06/24/17 1528  LIPASE 21    Recent Labs Lab 06/26/17 0933  AMMONIA 15    CBC:  Recent Labs Lab 06/24/17 1528 06/24/17 2248 06/26/17 0553  WBC 13.7* 13.8* 17.7*  NEUTROABS 10.4*  --   --   HGB 17.5 17.0 18.8*  HCT 52.1* 51.0 54.7*  MCV 90.4 89.6 88.5  PLT 225 227 242    Cardiac Enzymes:  Recent Labs Lab 06/24/17 1528 06/24/17 2248  TROPONINI <0.03 <0.03    BNP: Invalid input(s): POCBNP  CBG:  Recent Labs Lab 06/24/17 2234 06/26/17 0750  GLUCAP 120* 127*    Microbiology: Results for orders placed or performed during the hospital encounter of 06/24/17  MRSA PCR  Screening     Status: None   Collection Time: 06/24/17 10:48 PM  Result Value Ref Range Status   MRSA by PCR NEGATIVE NEGATIVE Final    Comment:        The GeneXpert MRSA Assay (FDA approved for NASAL specimens only), is one component of a comprehensive MRSA colonization surveillance program. It is not intended to diagnose MRSA infection nor to guide or monitor treatment for MRSA infections.   Urine Culture     Status: None   Collection Time: 06/24/17 11:14 PM  Result Value Ref Range Status   Specimen Description URINE, CLEAN CATCH  Final   Special Requests Normal  Final   Culture   Final    NO GROWTH Performed at [facility], [address] [place], [place] [address]    Report Status 06/26/2017 FINAL  Final    Coagulation Studies: No results for input(s): LABPROT, INR  in the last 72 hours.  Urinalysis:  Recent Labs Lab 06/19/17 1216 06/24/17 2314  COLORURINE  --  YELLOW*  LABSPEC  --  1.030  PHURINE  --  5.0  GLUCOSEU  --  NEGATIVE  HGBUR  --  NEGATIVE  BILIRUBINUR negative NEGATIVE  KETONESUR  --  NEGATIVE  PROTEINUR negative NEGATIVE  UROBILINOGEN 0.2  --   NITRITE negative NEGATIVE  LEUKOCYTESUR Small (1+)* NEGATIVE    Lipid Panel:     Component Value Date/Time   CHOL 171 01/18/2017 1032   TRIG 81.0 01/18/2017 1032   HDL 42.90 01/18/2017 1032   CHOLHDL 4 01/18/2017 1032   VLDL 16.2 01/18/2017 1032   LDLCALC 112 (H) 01/18/2017 1032    HgbA1C:  Lab Results  Component Value Date   HGBA1C 6.0 01/18/2017    Urine Drug Screen:  No results found for: LABOPIA, COCAINSCRNUR, LABBENZ, AMPHETMU, THCU, LABBARB  Alcohol Level: No results for input(s): ETH in the last 168 hours.  Other results: EKG: atrial fibrillation with RVR, rate 103 bpm.  Imaging: Dg Lumbar Spine 2-3 Views  Result Date: 06/24/2017 CLINICAL DATA:  Low back pain.  No reported injury. EXAM: LUMBAR SPINE - 2-3 VIEW COMPARISON:  None. FINDINGS: This report assumes 5 non  rib-bearing lumbar vertebrae. Lumbar vertebral body heights are preserved, with no fracture. Mild degenerative disc disease at L4-5. Moderate degenerative disc disease at L5-S1. No spondylolisthesis. Mild facet arthropathy in the lower lumbar spine. No aggressive appearing focal osseous lesions. Abdominal aortic atherosclerosis. Large focus of soft tissue prominence in the back at the level of the thoracolumbar junction. IMPRESSION: 1. No lumbar spine fracture or spondylolisthesis . 2. Mild-to-moderate lower lumbar degenerative disc disease, most prominent at L5-S1 . 3. Nonspecific large focus of soft tissue prominence in the back at the level of the thoracolumbar junction, correlate with clinical exam . 4.  Aortic Atherosclerosis (ICD10-I70.0). Electronically Signed   By: Ilona Sorrel M.D.   On: 06/24/2017 15:25   Ct Head Wo Contrast  Result Date: 06/26/2017 CLINICAL DATA:  Confusion, agitation EXAM: CT HEAD WITHOUT CONTRAST TECHNIQUE: Contiguous axial images were obtained from the base of the skull through the vertex without intravenous contrast. COMPARISON:  None. FINDINGS: Brain: No evidence of acute infarction, hemorrhage, extra-axial collection, ventriculomegaly, or mass effect. Generalized cerebral atrophy. Periventricular white matter low attenuation likely secondary to microangiopathy. Vascular: Cerebrovascular atherosclerotic calcifications are noted. Skull: Negative for fracture or focal lesion. Sinuses/Orbits: Visualized portions of the orbits are unremarkable. Visualized portions of the paranasal sinuses and mastoid air cells are unremarkable. Other: None. IMPRESSION: 1. No acute intracranial pathology. 2. Chronic microvascular disease and cerebral atrophy. Electronically Signed   By: Kathreen Devoid   On: 06/26/2017 09:57   Ct Abdomen Pelvis W Contrast  Result Date: 06/24/2017 CLINICAL DATA:  Low back pain, worsening for several weeks. EXAM: CT ABDOMEN AND PELVIS WITH CONTRAST TECHNIQUE:  Multidetector CT imaging of the abdomen and pelvis was performed using the standard protocol following bolus administration of intravenous contrast. CONTRAST:  185mL ISOVUE-300 IOPAMIDOL (ISOVUE-300) INJECTION 61% COMPARISON:  06/24/2017 lumbar spine radiographs. FINDINGS: Lower chest: No significant pulmonary nodules or acute consolidative airspace disease. Hepatobiliary: Normal liver size. Diffuse hepatic steatosis. Simple 1.2 cm lateral segment left liver lobe cyst. Three additional scattered subcentimeter hypodense liver lesions, too small to characterize. No additional liver lesions. No definite liver surface irregularity. Normal gallbladder with no radiopaque cholelithiasis. No biliary ductal dilatation. Pancreas: Normal, with no mass or duct dilation. Spleen: Normal size.  No mass. Adrenals/Urinary Tract: Right adrenal 1.1 cm nodule (series 2/ image 33) with indeterminate density 65 HU. No left adrenal nodules. No hydronephrosis. Subcentimeter hypodense renal cortical lesions in the upper kidneys bilaterally are too small to characterize and require no further follow-up. Normal bladder. Stomach/Bowel: Grossly normal stomach. Normal caliber small bowel with no small bowel wall thickening. Normal appendix. Minimal sigmoid diverticulosis, with no large bowel wall thickening or pericolonic fat stranding. Vascular/Lymphatic: Atherosclerotic abdominal aorta with 2.8 cm ectatic infrarenal abdominal aorta. Patent portal, splenic and renal veins. No pathologically enlarged lymph nodes in the abdomen or pelvis. Reproductive: Moderately enlarged prostate with nonspecific internal prostatic calcifications. Other: No pneumoperitoneum, ascites or focal fluid collection. Small fat containing right inguinal hernia. Small fat containing periumbilical hernia. Musculoskeletal: No aggressive appearing focal osseous lesions. Mild compression fracture of the L5 vertebral body with minimal 10% loss of vertebral body height. No bony  retropulsion. No additional fracture. Mild to moderate thoracolumbar spondylosis. IMPRESSION: 1. Mild L5 vertebral body compression fracture, which appears acute. No aggressive appearing focal osseous lesions. 2. Indeterminate right adrenal nodule. If the patient has a history of malignancy, adrenal protocol CT or MRI abdomen without and with IV contrast may be obtained at this time for further evaluation. Otherwise, a follow-up adrenal protocol CT abdomen may be obtained in 12 months. 3. Aortic atherosclerosis. 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 Ectatic abdominal aorta at risk for aneurysm development. Recommend followup by ultrasound in 5 years. This recommendation follows ACR consensus guidelines: [name]. J Am Coll Radiol 2013; 10:789-794. 4. Additional findings include diffuse hepatic steatosis, minimal sigmoid diverticulosis, moderately enlarged prostate and small fat containing right inguinal and periumbilical hernias. These results were called by telephone at the time of interpretation on 06/24/2017 at 6:46 pm to Dr. Jimmye Norman, who verbally acknowledged these results. Electronically Signed   By: Ilona Sorrel M.D.   On: 06/24/2017 18:49   Dg Chest Port 1 View  Result Date: 06/25/2017 CLINICAL DATA:  Severe back pain on admission yesterday. EXAM: PORTABLE CHEST 1 VIEW COMPARISON:  Chest CT dated 05/02/2017. FINDINGS: Borderline enlarged cardiac silhouette. Prominent pulmonary vasculature and interstitial markings. The lungs are mildly hyperexpanded. Lower thoracic spine degenerative changes. IMPRESSION: Borderline cardiomegaly and mild pulmonary vascular congestion with underlying COPD. Electronically Signed   By: Claudie Revering M.D.   On: 06/25/2017 07:51     Assessment/Plan: 76 year old male with chronic back pain and L5 compression fracture who has also developed mental status changes.  Per son these changes are to be expected  and have been seen in the past.  Patient on anticoagulation.  Head CT performed and reviewed.  CT shows no acute changes with evidence of small vessel ischemic changes and atrophy.  With this being the case no further neurological intervention recommended at this time. Case discussed with critical care team.    Alexis Goodell, MD Neurology [phone] 06/26/2017, 11:46 AM

## 2017-06-26 NOTE — Progress Notes (Signed)
*  PRELIMINARY RESULTS* Echocardiogram 2D Echocardiogram has been performed.  Sherrie Sport 06/26/2017, 2:03 PM

## 2017-06-26 NOTE — Progress Notes (Signed)
PT Cancellation Note  Patient Details Name: Ricardo Erickson MRN: [id number] DOB: Mar 20, 1941   Cancelled Treatment:    Reason Eval/Treat Not Completed: Patient not medically ready.  Pt transferred to CCU this morning (06/26/17) d/t medical issues and placed on Precedex infusion.  D/t pt transferring to higher level of care, per PT protocol require new PT consult in order to continue therapy (will discontinue current PT order d/t this).  Please re-consult PT when pt is medically appropriate to participate in PT.  Leitha Bleak, PT 06/26/17, 9:19 AM [phone]

## 2017-06-26 NOTE — Progress Notes (Signed)
Pt was given IM geodon with no relief. Another dose of IV haldol was placed by Dr. Marcille Blanco and given, still with no relief. This RN has been in pt's room since he was transferred to [place], without being able to leave. Pulled out one IV, and still very anxious and agitated, will not stay still. Transfer orders were placed for pt to go to ICU to be on a precedex gtt. Waiting on ICU to approve bed.

## 2017-06-26 NOTE — Plan of Care (Signed)
Problem: Physical Regulation: Goal: Ability to maintain clinical measurements within normal limits will improve Outcome: Progressing Heart rate controlled on amiodarone infusion and with patient calmer. Patient was off precedex infusion for several hours due to not meeting RASS goal (patient too sleeping) and was restarted after patient woke up and became restless and agitated.

## 2017-06-26 NOTE — Progress Notes (Signed)
[facility] Encounter Note  Patient: Ricardo Erickson / Admit Date: 06/24/2017 / Date of Encounter: 06/26/2017, 8:07 AM   Subjective: Patient still has significant rapid rate despite adjustments of medication management multifactorial in nature including significant low back pain and or other emphysema and hypertension Echocardiogram pending  Review of Systems:  Cannot assess due to patient's obtundation Objective: Telemetry: Atrial fibrillation with rapid ventricular rate Physical Exam: Blood pressure (!) 143/98, pulse 100, temperature 99.3 F (37.4 C), temperature source Axillary, resp. rate (!) 30, height 5\' 9"  (1.753 m), weight 98.2 kg (216 lb 8.6 oz), SpO2 95 %. Body mass index is 31.98 kg/m. General: Well developed, well nourished, obtunded Head: Normocephalic, atraumatic, sclera non-icteric, no xanthomas, nares are without discharge. Neck: No apparent masses Lungs: Normal respirations with no wheezes, no rhonchi, no rales , no crackles   Heart: Irregular rate and rhythm, normal S1 S2, no murmur, no rub, no gallop, PMI is normal size and placement, carotid upstroke normal without bruit, jugular venous pressure normal Abdomen: Soft, non-tender, non-distended with normoactive bowel sounds. No hepatosplenomegaly. Abdominal aorta is normal size without bruit Extremities: No edema, no clubbing, no cyanosis, no ulcers,  Peripheral: 2+ radial, 2+ femoral, 2+ dorsal pedal pulses Neuro: Not Alert and oriented. Moves all extremities spontaneously. Psych:  Does not Responds to questions appropriately with a normal affect.   Intake/Output Summary (Last 24 hours) at 06/26/17 0807 Last data filed at 06/26/17 0428  Gross per 24 hour  Intake                0 ml  Output             2825 ml  Net            -2825 ml    Inpatient Medications:  . busPIRone  7.5 mg Oral BID  . diltiazem  180 mg Oral Daily  . docusate sodium  100 mg Oral BID  . furosemide  20 mg Intravenous  Q12H  . haloperidol lactate  1 mg Intravenous Once  . mometasone-formoterol  2 puff Inhalation BID  . pramipexole  1 mg Oral QHS   Infusions:  . amiodarone 60 mg/hr (06/26/17 0428)  . amiodarone    .  ceFAZolin (ANCEF) IV    . dexmedetomidine 0.5 mcg/kg/hr (06/26/17 0745)    Labs:  Recent Labs  06/24/17 1528 06/24/17 2248 06/26/17 0553  NA 135 133* 137  K 3.4* 3.2* 3.7  CL 98* 97* 99*  CO2 24 25 25   GLUCOSE 104* 133* 118*  BUN 13 14 15   CREATININE 1.14 1.05 1.18  CALCIUM 9.3 9.0 9.6  MG 1.8 1.7  --   PHOS  --  4.2  --     Recent Labs  06/24/17 1528  AST 24  ALT 11*  ALKPHOS 76  BILITOT 1.7*  PROT 6.9  ALBUMIN 3.8    Recent Labs  06/24/17 1528 06/24/17 2248 06/26/17 0553  WBC 13.7* 13.8* 17.7*  NEUTROABS 10.4*  --   --   HGB 17.5 17.0 18.8*  HCT 52.1* 51.0 54.7*  MCV 90.4 89.6 88.5  PLT 225 227 242    Recent Labs  06/24/17 1528 06/24/17 2248  TROPONINI <0.03 <0.03   Invalid input(s): POCBNP No results for input(s): HGBA1C in the last 72 hours.   Weights: Filed Weights   06/24/17 1356 06/24/17 2234  Weight: 100.2 kg (221 lb) 98.2 kg (216 lb 8.6 oz)     Radiology/Studies:  Dg  Lumbar Spine 2-3 Views  Result Date: 06/24/2017 CLINICAL DATA:  Low back pain.  No reported injury. EXAM: LUMBAR SPINE - 2-3 VIEW COMPARISON:  None. FINDINGS: This report assumes 5 non rib-bearing lumbar vertebrae. Lumbar vertebral body heights are preserved, with no fracture. Mild degenerative disc disease at L4-5. Moderate degenerative disc disease at L5-S1. No spondylolisthesis. Mild facet arthropathy in the lower lumbar spine. No aggressive appearing focal osseous lesions. Abdominal aortic atherosclerosis. Large focus of soft tissue prominence in the back at the level of the thoracolumbar junction. IMPRESSION: 1. No lumbar spine fracture or spondylolisthesis . 2. Mild-to-moderate lower lumbar degenerative disc disease, most prominent at L5-S1 . 3. Nonspecific large focus  of soft tissue prominence in the back at the level of the thoracolumbar junction, correlate with clinical exam . 4.  Aortic Atherosclerosis (ICD10-I70.0). Electronically Signed   By: Ilona Sorrel M.D.   On: 06/24/2017 15:25   Ct Abdomen Pelvis W Contrast  Result Date: 06/24/2017 CLINICAL DATA:  Low back pain, worsening for several weeks. EXAM: CT ABDOMEN AND PELVIS WITH CONTRAST TECHNIQUE: Multidetector CT imaging of the abdomen and pelvis was performed using the standard protocol following bolus administration of intravenous contrast. CONTRAST:  158mL ISOVUE-300 IOPAMIDOL (ISOVUE-300) INJECTION 61% COMPARISON:  06/24/2017 lumbar spine radiographs. FINDINGS: Lower chest: No significant pulmonary nodules or acute consolidative airspace disease. Hepatobiliary: Normal liver size. Diffuse hepatic steatosis. Simple 1.2 cm lateral segment left liver lobe cyst. Three additional scattered subcentimeter hypodense liver lesions, too small to characterize. No additional liver lesions. No definite liver surface irregularity. Normal gallbladder with no radiopaque cholelithiasis. No biliary ductal dilatation. Pancreas: Normal, with no mass or duct dilation. Spleen: Normal size. No mass. Adrenals/Urinary Tract: Right adrenal 1.1 cm nodule (series 2/ image 33) with indeterminate density 65 HU. No left adrenal nodules. No hydronephrosis. Subcentimeter hypodense renal cortical lesions in the upper kidneys bilaterally are too small to characterize and require no further follow-up. Normal bladder. Stomach/Bowel: Grossly normal stomach. Normal caliber small bowel with no small bowel wall thickening. Normal appendix. Minimal sigmoid diverticulosis, with no large bowel wall thickening or pericolonic fat stranding. Vascular/Lymphatic: Atherosclerotic abdominal aorta with 2.8 cm ectatic infrarenal abdominal aorta. Patent portal, splenic and renal veins. No pathologically enlarged lymph nodes in the abdomen or pelvis. Reproductive:  Moderately enlarged prostate with nonspecific internal prostatic calcifications. Other: No pneumoperitoneum, ascites or focal fluid collection. Small fat containing right inguinal hernia. Small fat containing periumbilical hernia. Musculoskeletal: No aggressive appearing focal osseous lesions. Mild compression fracture of the L5 vertebral body with minimal 10% loss of vertebral body height. No bony retropulsion. No additional fracture. Mild to moderate thoracolumbar spondylosis. IMPRESSION: 1. Mild L5 vertebral body compression fracture, which appears acute. No aggressive appearing focal osseous lesions. 2. Indeterminate right adrenal nodule. If the patient has a history of malignancy, adrenal protocol CT or MRI abdomen without and with IV contrast may be obtained at this time for further evaluation. Otherwise, a follow-up adrenal protocol CT abdomen may be obtained in 12 months. 3. Aortic atherosclerosis. 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 Ectatic abdominal aorta at risk for aneurysm development. Recommend followup by ultrasound in 5 years. This recommendation follows ACR consensus guidelines: [name]. J Am Coll Radiol 2013; 10:789-794. 4. Additional findings include diffuse hepatic steatosis, minimal sigmoid diverticulosis, moderately enlarged prostate and small fat containing right inguinal and periumbilical hernias. These results were called by telephone at the time of interpretation on 06/24/2017 at  6:46 pm to Dr. Jimmye Norman, who verbally acknowledged these results. Electronically Signed   By: Ilona Sorrel M.D.   On: 06/24/2017 18:49   Dg Chest Port 1 View  Result Date: 06/25/2017 CLINICAL DATA:  Severe back pain on admission yesterday. EXAM: PORTABLE CHEST 1 VIEW COMPARISON:  Chest CT dated 05/02/2017. FINDINGS: Borderline enlarged cardiac silhouette. Prominent pulmonary vasculature and interstitial markings. The lungs are mildly  hyperexpanded. Lower thoracic spine degenerative changes. IMPRESSION: Borderline cardiomegaly and mild pulmonary vascular congestion with underlying COPD. Electronically Signed   By: Claudie Revering M.D.   On: 06/25/2017 07:51     Assessment and Recommendation  76 y.o. male with significant emphysema with hypoxia essential hypertension with compression fracture causing excruciating pain with significant tachycardia slightly improved with medication management 1. The cardiograph for LV systolic dysfunction valvular heart disease contributing to above 2. Continue supportive care of back pain and emphysema and hypoxia 3. Increase dose of the diltiazem for better heart rate control of atrial fibrillation 4. Will likely need anticoagulation for further risk reduction in stroke with atrial fibrillation although would consider abstain right now until further evaluation of need for back surgery and/or disc treatment  Signed, Serafina Royals M.D. FACC

## 2017-06-26 NOTE — Progress Notes (Signed)
Report called to Lexi--

## 2017-06-26 NOTE — Progress Notes (Signed)
Central Telemetry reported pt had 3 beat run V-Tach. Pt heart rate fluctuating between 130's to 160. Dr Charlyne Quale notified. Received order to transfer pt to 2A

## 2017-06-26 NOTE — Progress Notes (Signed)
PULMONARY / CRITICAL CARE MEDICINE   Name: Ricardo Erickson MRN: [id number] DOB: 05-15-1941    ADMISSION DATE:  06/24/2017   CONSULTATION DATE:  06/24/17  REFERRING MD: Dr. Posey Pronto  Reason: ICU management of new onset atrial fibrillation with hemodynamic stability  CHIEF COMPLAINT:  Acute back pain  HISTORY OF PRESENT ILLNESS/synopsis    76 year old Caucasian male who presented to the ED with complaints of acute onset back pain. States that symptoms have gradually got worse over last couple of days, making it hard for him to make it to the bathroom or perform his other activities of daily living. He reports lower extremity weakness, which he attributes to his back. He reports episodes of urinary and stool incontinence, but attributes that to his inability to walk to the bathroom. At the ED, his preliminary workup showed an L5 fracture and new onset atrial fibrillation with a ventricular rate in the 130s. He reports a history of elevated PSA but states that he never followed through with his primary care provider. Patient feels that he might have prostate cancer  SUBJECTIVE: Patient transferred back to the ICU for increased work of breathing increased delirium Patient is at high risk for cardiac arrest and respiratory failure Patient placed on Precedex infusion Patient had been treated with heparin infusion for A. fib RVR it was stopped yesterday morning Patient has severe encephalopathy at this time obtain CT of the head to rule out acute stroke  VITAL SIGNS: BP (!) 143/98 (BP Location: Left Arm)   Pulse 100   Temp 99.3 F (37.4 C) (Axillary)   Resp (!) 30   Ht 5\' 9"  (1.753 m)   Wt 216 lb 8.6 oz (98.2 kg)   SpO2 95%   BMI 31.98 kg/m    INTAKE / OUTPUT: I/O last 3 completed shifts: In: 79.9 [I.V.:79.9] Out: 3450 [Urine:3450]  PHYSICAL EXAMINATION: General: + Respiratory distress  Neuro:  Obtunded  HEENT:  PERRLA, trachea midline, oral mucosa moist and pink Cardiovascular:   Apical pulse irregular irregular, mildly tachycardic at a rate of 110 bpm, S1, S2 audible, no murmur, regurg or gallop, +2 lower extremity edema, +2 pulses bilaterally Lungs: + wheezes Abdomen: BS, positive bowel sounds in all 4 quadrants Musculoskeletal:  No deformities, no swelling Skin:  Warm and dry  LABS:  BMET  Recent Labs Lab 06/24/17 1528 06/24/17 2248 06/26/17 0553  NA 135 133* 137  K 3.4* 3.2* 3.7  CL 98* 97* 99*  CO2 24 25 25   BUN 13 14 15   CREATININE 1.14 1.05 1.18  GLUCOSE 104* 133* 118*    Electrolytes  Recent Labs Lab 06/24/17 1528 06/24/17 2248 06/26/17 0553  CALCIUM 9.3 9.0 9.6  MG 1.8 1.7  --   PHOS  --  4.2  --     CBC  Recent Labs Lab 06/24/17 1528 06/24/17 2248 06/26/17 0553  WBC 13.7* 13.8* 17.7*  HGB 17.5 17.0 18.8*  HCT 52.1* 51.0 54.7*  PLT 225 227 242    Coag's No results for input(s): APTT, INR in the last 168 hours.  Sepsis Markers  Recent Labs Lab 06/24/17 2300  LATICACIDVEN 1.2    ABG No results for input(s): PHART, PCO2ART, PO2ART in the last 168 hours.  Liver Enzymes  Recent Labs Lab 06/24/17 1528  AST 24  ALT 11*  ALKPHOS 76  BILITOT 1.7*  ALBUMIN 3.8    Cardiac Enzymes  Recent Labs Lab 06/24/17 1528 06/24/17 2248  TROPONINI <0.03 <0.03    Glucose  Recent Labs Lab 06/24/17 2234 06/26/17 0750  GLUCAP 120* 127*    Imaging No results found.  STUDIES:  2-D echo  CULTURES: None  ANTIBIOTICS: None  SIGNIFICANT EVENTS: 06/24/2017: ED with acute back pain, found to be in A. fib with RVR  LINES/TUBES: Peripheral IVs  DISCUSSION: 76 year old Caucasian male presenting with acute back pain secondary to L5 compression fracture and new onset atrial fibrillation, now with progressive respiratory failure and progressive encephalopathy unknown etiology transfer to the ICU for increased work of breathing patient is at high risk for cardiac arrest and respiratory failure In the setting of  chronic tobacco abuse with COPD emphysema   1.Respiratory Failure -obtain ABG -High risk for intubation  2. Acute A. fib with RVR Follow-up cardiology recommendations Will consider amiodarone infusion  3. encephalopathy CT of the head pending  4. Patient remains ICU status   Critical Care Time devoted to patient care services described in this note is 48 minutes.   Overall, patient is critically ill, prognosis is guarded.  Patient with Multiorgan failure and at high risk for cardiac arrest and death.    Corrin Parker, M.D.  [facility]  Medical Director [place] [facility] Cardio-Pulmonary Department

## 2017-06-26 NOTE — Care Management (Signed)
Patient currently in ICU; son at bedside and seemed distracted. He needs time to adjust to patient status and hopeful recovery. RNCM will need to continue to follow. Patient at baseline is independent from home and able to drive. Patient I spending Kyphoplasty in outpatient setting. Home health list left with patient's son. I introduced RNCM role and that we would continue to follow patient progression.

## 2017-06-26 NOTE — Progress Notes (Signed)
Can not have kyphoplasty with current mental status, will continue to follow and hope to proceed with procedure sometime this week if patient improves.

## 2017-06-26 NOTE — Progress Notes (Addendum)
Pt stated he is seeing demons. He stated they are all  Over the room. Dr Jannifer Franklin notified. Received order for IV haldol for anxiety and lopressor for elevated heart rate.

## 2017-06-26 NOTE — Progress Notes (Signed)
Pt very agitated, and anxious, trying to crawl out of bed, have not been able to leave room since pt was transferred up here. Pt had received xanax, haldol, ativan before coming to 2A. This RN gave pt toradol for pain & mirapex for restless legs.  Pt's HR still as high at 160's, pt currently pn amiodarone gtt. Dr. Marcille Blanco at bedside. Ordered geodon. Will give and continue to monitor.

## 2017-06-26 NOTE — Progress Notes (Signed)
Pt is still complaining of seeing demons. He has attempted to get out of bed several times. He states he can't breathe and needs his inhalers. Dr Marcille Blanco was notified and will place orders.

## 2017-06-27 ENCOUNTER — Inpatient Hospital Stay: Payer: Medicare Other

## 2017-06-27 ENCOUNTER — Encounter
Admission: EM | Disposition: A | Discharge status: Skilled Nursing Facility | Payer: Self-pay | Source: Home / Self Care | Attending: Internal Medicine

## 2017-06-27 ENCOUNTER — Encounter: Payer: Self-pay | Admitting: Anesthesiology

## 2017-06-27 ENCOUNTER — Inpatient Hospital Stay: Payer: Medicare Other | Admitting: Anesthesiology

## 2017-06-27 HISTORY — PX: KYPHOPLASTY: SHX5884

## 2017-06-27 LAB — CBC
HCT: 52.9 % — ABNORMAL HIGH (ref 40.0–52.0)
Hemoglobin: 17.7 g/dL (ref 13.0–18.0)
MCH: 30.4 pg (ref 26.0–34.0)
MCHC: 33.5 g/dL (ref 32.0–36.0)
MCV: 90.6 fL (ref 80.0–100.0)
PLATELETS: 202 10*3/uL (ref 150–440)
RBC: 5.84 MIL/uL (ref 4.40–5.90)
RDW: 14.9 % — ABNORMAL HIGH (ref 11.5–14.5)
WBC: 15.2 10*3/uL — AB (ref 3.8–10.6)

## 2017-06-27 LAB — ECHOCARDIOGRAM COMPLETE
HEIGHTINCHES: 69 in
Weight: 3464.57 oz

## 2017-06-27 LAB — CREATININE, SERUM
CREATININE: 1.38 mg/dL — AB (ref 0.61–1.24)
GFR calc non Af Amer: 49 mL/min — ABNORMAL LOW (ref >60)
GFR, EST AFRICAN AMERICAN: 57 mL/min — AB (ref >60)

## 2017-06-27 LAB — PROCALCITONIN

## 2017-06-27 SURGERY — KYPHOPLASTY
Anesthesia: Monitor Anesthesia Care | Wound class: Clean

## 2017-06-27 MED ORDER — SODIUM CHLORIDE FLUSH 0.9 % IV SOLN
INTRAVENOUS | Status: AC
  Filled 2017-06-27: qty 3

## 2017-06-27 MED ORDER — FENTANYL CITRATE (PF) 100 MCG/2ML IJ SOLN: 25.0000 ug | INTRAMUSCULAR | Status: DC | PRN

## 2017-06-27 MED ORDER — IOPAMIDOL (ISOVUE-M 200) INJECTION 41%
INTRAMUSCULAR | Status: AC
  Filled 2017-06-27: qty 20

## 2017-06-27 MED ORDER — METOCLOPRAMIDE HCL 5 MG/ML IJ SOLN: 5.0000 mg | Freq: Three times a day (TID) | INTRAMUSCULAR | Status: DC | PRN

## 2017-06-27 MED ORDER — ONDANSETRON HCL 4 MG/2ML IJ SOLN: 4.0000 mg | Freq: Once | INTRAMUSCULAR | Status: DC | PRN

## 2017-06-27 MED ORDER — BUPIVACAINE HCL (PF) 0.25 % IJ SOLN
INTRAMUSCULAR | Status: AC
  Filled 2017-06-27: qty 30

## 2017-06-27 MED ORDER — LIDOCAINE HCL 1 % IJ SOLN
INTRAMUSCULAR | Status: DC | PRN
  Administered 2017-06-27: 10 mL

## 2017-06-27 MED ORDER — LIDOCAINE HCL (PF) 1 % IJ SOLN
INTRAMUSCULAR | Status: AC
  Filled 2017-06-27: qty 60

## 2017-06-27 MED ORDER — KETAMINE HCL 50 MG/ML IJ SOLN
INTRAMUSCULAR | Status: DC | PRN
  Administered 2017-06-27: 50 mg via INTRAMUSCULAR

## 2017-06-27 MED ORDER — METOCLOPRAMIDE HCL 5 MG PO TABS
5.0000 mg | ORAL_TABLET | Freq: Three times a day (TID) | ORAL | Status: DC | PRN
  Filled 2017-06-27: qty 2

## 2017-06-27 MED ORDER — KETAMINE HCL 50 MG/ML IJ SOLN
INTRAMUSCULAR | Status: AC
  Filled 2017-06-27: qty 10

## 2017-06-27 MED ORDER — CEFAZOLIN SODIUM-DEXTROSE 1-4 GM/50ML-% IV SOLN
INTRAVENOUS | Status: DC | PRN
  Administered 2017-06-27: 1 g via INTRAVENOUS

## 2017-06-27 MED ORDER — BUPIVACAINE-EPINEPHRINE (PF) 0.5% -1:200000 IJ SOLN
INTRAMUSCULAR | Status: AC
  Filled 2017-06-27: qty 30

## 2017-06-27 MED ORDER — ENOXAPARIN SODIUM 40 MG/0.4ML ~~LOC~~ SOLN
40.0000 mg | SUBCUTANEOUS | Status: DC
  Administered 2017-06-28 – 2017-06-29 (×2): 40 mg via SUBCUTANEOUS
  Filled 2017-06-27 (×2): qty 0.4

## 2017-06-27 MED ORDER — LACTATED RINGERS IV SOLN
INTRAVENOUS | Status: DC
  Administered 2017-06-27: 15:00:00 via INTRAVENOUS
  Administered 2017-06-27: 75 mL/h via INTRAVENOUS
  Administered 2017-06-28: 19:00:00 via INTRAVENOUS

## 2017-06-27 SURGICAL SUPPLY — 16 items
CEMENT BONE KYPHON CDS (Cement) ×3 IMPLANT
DERMABOND ADVANCED (GAUZE/BANDAGES/DRESSINGS) ×2
DERMABOND ADVANCED .7 DNX12 (GAUZE/BANDAGES/DRESSINGS) ×1 IMPLANT
DEVICE BIOPSY BONE KYPHX (INSTRUMENTS) ×3 IMPLANT
DRAPE C-ARM XRAY 36X54 (DRAPES) ×3 IMPLANT
DURAPREP 26ML APPLICATOR (WOUND CARE) ×3 IMPLANT
GLOVE SURG SYN 9.0  PF PI (GLOVE) ×2
GLOVE SURG SYN 9.0 PF PI (GLOVE) ×1 IMPLANT
GOWN SRG 2XL LVL 4 RGLN SLV (GOWNS) ×1 IMPLANT
GOWN STRL NON-REIN 2XL LVL4 (GOWNS) ×2
GOWN STRL REUS W/ TWL LRG LVL3 (GOWN DISPOSABLE) ×1 IMPLANT
GOWN STRL REUS W/TWL LRG LVL3 (GOWN DISPOSABLE) ×2
PACK KYPHOPLASTY (MISCELLANEOUS) ×3 IMPLANT
STRAP SAFETY BODY (MISCELLANEOUS) ×3 IMPLANT
TRAY KYPHOPAK 15/3 EXPRESS 1ST (MISCELLANEOUS) ×3 IMPLANT
TRAY KYPHOPAK 20/3 EXPRESS 1ST (MISCELLANEOUS) IMPLANT

## 2017-06-27 NOTE — Progress Notes (Signed)
[place] at [facility]                                                                                                                                                                                  Patient Demographics   Ricardo Erickson, is a 76 y.o. male, DOB - 04-20-1941, NWG:[id number]  Admit date - 06/24/2017   Admitting Physician Fritzi Mandes, MD  Outpatient Primary MD for the patient is Rusty Aus, MD   LOS - 3  Subjective: This morning his on Precedex drip and currently sedated son at bedside asking of kyphoplasty will be done today   Review of Systems:   CONSTITUTIONAL:Patient confused  Vitals:   Vitals:   06/27/17 1100 06/27/17 1200 06/27/17 1300 06/27/17 1400  BP: 116/79 107/70 110/75 112/79  Pulse: 76 86 78 77  Resp: (!) 24 (!) 33 (!) 33 20  Temp:    (!) 97.4 F (36.3 C)  TempSrc:      SpO2: 94% 94% 94% 95%  Weight:      Height:        Wt Readings from Last 3 Encounters:  06/24/17 216 lb 8.6 oz (98.2 kg)  06/19/17 221 lb 6.4 oz (100.4 kg)  06/08/17 220 lb (99.8 kg)     Intake/Output Summary (Last 24 hours) at 06/27/17 1532 Last data filed at 06/27/17 1454  Gross per 24 hour  Intake          1103.16 ml  Output             1220 ml  Net          -116.84 ml    Physical Exam:   GENERAL: Currently on Precedex HEAD, EYES, EARS, NOSE AND THROAT: Atraumatic, normocephalic. Extraocular muscles are intact. Pupils equal and reactive to light. Sclerae anicteric. No conjunctival injection. No oro-pharyngeal erythema.  NECK: Supple. There is no jugular venous distention. No bruits, no lymphadenopathy, no thyromegaly.  HEART: Irregularly regular. No murmurs, no rubs, no clicks.  LUNGS: Clear to auscultation bilaterally. No rales or rhonchi. No wheezes.  ABDOMEN: Soft, flat, nontender, nondistended. Has good bowel sounds. No hepatosplenomegaly appreciated.  EXTREMITIES: No evidence of any cyanosis, clubbing, or peripheral edema.  +2  pedal and radial pulses bilaterally.  NEUROLOGIC: Sedated  SKIN: Moist and warm with no rashes appreciated.  Psych: Sedated LN: No inguinal LN enlargement    Antibiotics   Anti-infectives    Start     Dose/Rate Route Frequency Ordered Stop   06/26/17 1300  ceFAZolin (ANCEF) IVPB 2g/100 mL premix     2 g 200 mL/hr over 30 Minutes Intravenous  Once 06/25/17 1453 06/26/17 1356  Medications   Scheduled Meds: . busPIRone  7.5 mg Oral BID  . diltiazem  240 mg Oral Daily  . docusate sodium  100 mg Oral BID  . haloperidol lactate  1 mg Intravenous Once  . mometasone-formoterol  2 puff Inhalation BID  . pramipexole  1 mg Oral QHS   Continuous Infusions: . amiodarone 30 mg/hr (06/27/17 1000)  . dexmedetomidine 0.6 mcg/kg/hr (06/27/17 1034)  . lactated ringers 75 mL/hr at 06/27/17 1452   PRN Meds:.acetaminophen **OR** [DISCONTINUED] acetaminophen, albuterol, bisacodyl, iopamidol, ketorolac, midazolam, morphine injection, ondansetron **OR** ondansetron (ZOFRAN) IV, oxyCODONE, polyethylene glycol   Data Review:   Micro Results Recent Results (from the past 240 hour(s))  Urine Culture     Status: None   Collection Time: 06/19/17 12:47 PM  Result Value Ref Range Status   Organism ID, Bacteria NO GROWTH  Final  MRSA PCR Screening     Status: None   Collection Time: 06/24/17 10:48 PM  Result Value Ref Range Status   MRSA by PCR NEGATIVE NEGATIVE Final    Comment:        The GeneXpert MRSA Assay (FDA approved for NASAL specimens only), is one component of a comprehensive MRSA colonization surveillance program. It is not intended to diagnose MRSA infection nor to guide or monitor treatment for MRSA infections.   Urine Culture     Status: None   Collection Time: 06/24/17 11:14 PM  Result Value Ref Range Status   Specimen Description URINE, CLEAN CATCH  Final   Special Requests Normal  Final   Culture   Final    NO GROWTH Performed at [facility], [address] [place], [place] [address]    Report Status 06/26/2017 FINAL  Final    Radiology Reports Dg Lumbar Spine 2-3 Views  Result Date: 06/24/2017 CLINICAL DATA:  Low back pain.  No reported injury. EXAM: LUMBAR SPINE - 2-3 VIEW COMPARISON:  None. FINDINGS: This report assumes 5 non rib-bearing lumbar vertebrae. Lumbar vertebral body heights are preserved, with no fracture. Mild degenerative disc disease at L4-5. Moderate degenerative disc disease at L5-S1. No spondylolisthesis. Mild facet arthropathy in the lower lumbar spine. No aggressive appearing focal osseous lesions. Abdominal aortic atherosclerosis. Large focus of soft tissue prominence in the back at the level of the thoracolumbar junction. IMPRESSION: 1. No lumbar spine fracture or spondylolisthesis . 2. Mild-to-moderate lower lumbar degenerative disc disease, most prominent at L5-S1 . 3. Nonspecific large focus of soft tissue prominence in the back at the level of the thoracolumbar junction, correlate with clinical exam . 4.  Aortic Atherosclerosis (ICD10-I70.0). Electronically Signed   By: Ilona Sorrel M.D.   On: 06/24/2017 15:25   Ct Head Wo Contrast  Result Date: 06/26/2017 CLINICAL DATA:  Confusion, agitation EXAM: CT HEAD WITHOUT CONTRAST TECHNIQUE: Contiguous axial images were obtained from the base of the skull through the vertex without intravenous contrast. COMPARISON:  None. FINDINGS: Brain: No evidence of acute infarction, hemorrhage, extra-axial collection, ventriculomegaly, or mass effect. Generalized cerebral atrophy. Periventricular white matter low attenuation likely secondary to microangiopathy. Vascular: Cerebrovascular atherosclerotic calcifications are noted. Skull: Negative for fracture or focal lesion. Sinuses/Orbits: Visualized portions of the orbits are unremarkable. Visualized portions of the paranasal sinuses and mastoid air cells are unremarkable. Other: None. IMPRESSION: 1. No acute intracranial pathology. 2. Chronic  microvascular disease and cerebral atrophy. Electronically Signed   By: Kathreen Devoid   On: 06/26/2017 09:57   Ct Abdomen Pelvis W Contrast  Result Date:  06/24/2017 CLINICAL DATA:  Low back pain, worsening for several weeks. EXAM: CT ABDOMEN AND PELVIS WITH CONTRAST TECHNIQUE: Multidetector CT imaging of the abdomen and pelvis was performed using the standard protocol following bolus administration of intravenous contrast. CONTRAST:  138mL ISOVUE-300 IOPAMIDOL (ISOVUE-300) INJECTION 61% COMPARISON:  06/24/2017 lumbar spine radiographs. FINDINGS: Lower chest: No significant pulmonary nodules or acute consolidative airspace disease. Hepatobiliary: Normal liver size. Diffuse hepatic steatosis. Simple 1.2 cm lateral segment left liver lobe cyst. Three additional scattered subcentimeter hypodense liver lesions, too small to characterize. No additional liver lesions. No definite liver surface irregularity. Normal gallbladder with no radiopaque cholelithiasis. No biliary ductal dilatation. Pancreas: Normal, with no mass or duct dilation. Spleen: Normal size. No mass. Adrenals/Urinary Tract: Right adrenal 1.1 cm nodule (series 2/ image 33) with indeterminate density 65 HU. No left adrenal nodules. No hydronephrosis. Subcentimeter hypodense renal cortical lesions in the upper kidneys bilaterally are too small to characterize and require no further follow-up. Normal bladder. Stomach/Bowel: Grossly normal stomach. Normal caliber small bowel with no small bowel wall thickening. Normal appendix. Minimal sigmoid diverticulosis, with no large bowel wall thickening or pericolonic fat stranding. Vascular/Lymphatic: Atherosclerotic abdominal aorta with 2.8 cm ectatic infrarenal abdominal aorta. Patent portal, splenic and renal veins. No pathologically enlarged lymph nodes in the abdomen or pelvis. Reproductive: Moderately enlarged prostate with nonspecific internal prostatic calcifications. Other: No pneumoperitoneum, ascites or  focal fluid collection. Small fat containing right inguinal hernia. Small fat containing periumbilical hernia. Musculoskeletal: No aggressive appearing focal osseous lesions. Mild compression fracture of the L5 vertebral body with minimal 10% loss of vertebral body height. No bony retropulsion. No additional fracture. Mild to moderate thoracolumbar spondylosis. IMPRESSION: 1. Mild L5 vertebral body compression fracture, which appears acute. No aggressive appearing focal osseous lesions. 2. Indeterminate right adrenal nodule. If the patient has a history of malignancy, adrenal protocol CT or MRI abdomen without and with IV contrast may be obtained at this time for further evaluation. Otherwise, a follow-up adrenal protocol CT abdomen may be obtained in 12 months. 3. Aortic atherosclerosis. 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 Ectatic abdominal aorta at risk for aneurysm development. Recommend followup by ultrasound in 5 years. This recommendation follows ACR consensus guidelines: [name]. J Am Coll Radiol 2013; 10:789-794. 4. Additional findings include diffuse hepatic steatosis, minimal sigmoid diverticulosis, moderately enlarged prostate and small fat containing right inguinal and periumbilical hernias. These results were called by telephone at the time of interpretation on 06/24/2017 at 6:46 pm to Dr. Jimmye Norman, who verbally acknowledged these results. Electronically Signed   By: Ilona Sorrel M.D.   On: 06/24/2017 18:49   Dg Chest Port 1 View  Result Date: 06/25/2017 CLINICAL DATA:  Severe back pain on admission yesterday. EXAM: PORTABLE CHEST 1 VIEW COMPARISON:  Chest CT dated 05/02/2017. FINDINGS: Borderline enlarged cardiac silhouette. Prominent pulmonary vasculature and interstitial markings. The lungs are mildly hyperexpanded. Lower thoracic spine degenerative changes. IMPRESSION: Borderline cardiomegaly and mild pulmonary vascular  congestion with underlying COPD. Electronically Signed   By: Claudie Revering M.D.   On: 06/25/2017 07:51     CBC  Recent Labs Lab 06/24/17 1528 06/24/17 2248 06/26/17 0553  WBC 13.7* 13.8* 17.7*  HGB 17.5 17.0 18.8*  HCT 52.1* 51.0 54.7*  PLT 225 227 242  MCV 90.4 89.6 88.5  MCH 30.4 29.8 30.5  MCHC 33.6 33.2 34.4  RDW 14.9* 14.7* 14.6*  LYMPHSABS 1.7  --   --  MONOABS 1.4*  --   --   EOSABS 0.1  --   --   BASOSABS 0.1  --   --     Chemistries   Recent Labs Lab 06/24/17 1528 06/24/17 2248 06/26/17 0553 06/26/17 0906  NA 135 133* 137  --   K 3.4* 3.2* 3.7  --   CL 98* 97* 99*  --   CO2 24 25 25   --   GLUCOSE 104* 133* 118*  --   BUN 13 14 15   --   CREATININE 1.14 1.05 1.18  --   CALCIUM 9.3 9.0 9.6  --   MG 1.8 1.7  --   --   AST 24  --   --  38  ALT 11*  --   --  12*  ALKPHOS 76  --   --  79  BILITOT 1.7*  --   --  1.3*   ------------------------------------------------------------------------------------------------------------------ estimated creatinine clearance is 63.5 mL/min (by C-G formula based on SCr of 1.18 mg/dL). ------------------------------------------------------------------------------------------------------------------ No results for input(s): HGBA1C in the last 72 hours. ------------------------------------------------------------------------------------------------------------------ No results for input(s): CHOL, HDL, LDLCALC, TRIG, CHOLHDL, LDLDIRECT in the last 72 hours. ------------------------------------------------------------------------------------------------------------------ No results for input(s): TSH, T4TOTAL, T3FREE, THYROIDAB in the last 72 hours.  Invalid input(s): FREET3 ------------------------------------------------------------------------------------------------------------------ No results for input(s): VITAMINB12, FOLATE, FERRITIN, TIBC, IRON, RETICCTPCT in the last 72 hours.  Coagulation profile No results for  input(s): INR, PROTIME in the last 168 hours.  No results for input(s): DDIMER in the last 72 hours.  Cardiac Enzymes  Recent Labs Lab 06/24/17 1528 06/24/17 2248  TROPONINI <0.03 <0.03   ------------------------------------------------------------------------------------------------------------------ Invalid input(s): POCBNP    Assessment & Plan   Ewing Fandino  is a 76 y.o. male with a known history of Chronic back pain and Emphysema along with ongoing tobacco abuse, hypertension, depression comes to the emergency room accompanied by son and daughter-in-law after patient started having increasing back pain for last couple days. He was seen as outpatient by Dr. Sabra Heck his primary care physician and given his problems with prostate enlargement and PSA was done which was reported as 10.33. Patient was to follow-up and get a CT of the abdomen. He started having excruciating lower back pain and brought to the emergency room where CT scan of the abdomen showed L5 compression fracture with about 10% height loss.  1. A. fib with RVR new onset -Patient on amiodarone drip Echocardiogram shows systolic dysfunction Continue therapy with Cardizem Cardiology following  2. Acute encephalopathy due to patient's anxiety and pain  2. Acute on chronic back pain due to L5 compression fracture -When necessary pain meds -Appreciate orthopedic consult plan for kyphoplasty today -PT consult  3. Bilateral lower extremity edema-new -Echo of the heart shows CHF    4. COPD/emphysema with ongoing tobacco abuse -When necessary nebs and inhalers  5. Elevated PSA levels to 10.33 -Patient has history of prostate enlargement on CT renal study -He will need to follow up with urology for further workup as outpatient  6. Nicotine abuse smoking cessation provided     Code Status Orders        Start     Ordered   06/24/17 2236  Full code  Continuous     06/24/17 2235    Code Status History     Date Active Date Inactive Code Status Order ID Comments User Context   This patient has a current code status but no historical code status.  Consults ortho ,cards   DVT Prophylaxis  Lovenox   Lab Results  Component Value Date   PLT 242 06/26/2017     Time Spent in minutes  66min  Greater than 50% of time spent in care coordination and counseling patient regarding the condition and plan of care.   Dustin Flock M.D on 06/27/2017 at 3:32 PM  Between 7am to 6pm - Pager - [phone]  After 6pm go to [email or web address] - password [email or web address] [facility]   Office  [phone]

## 2017-06-27 NOTE — Progress Notes (Signed)
Patient had only 74ml out with his condom catheter at 2am, despite having not much liquid going in his IV. Bladder scanned for >334. New order received to place an indwelling foley catheter. So far 425 returned as output at this point.

## 2017-06-27 NOTE — Op Note (Signed)
06/24/2017 - 06/27/2017  5:31 PM  PATIENT:  Ricardo Erickson  76 y.o. male  PRE-OPERATIVE DIAGNOSIS:  L5 compression fracture   POST-OPERATIVE DIAGNOSIS:  L5 compression fracture   PROCEDURE:  Procedure(s): KYPHOPLASTY -L5 (N/A)  SURGEON: Laurene Footman, MD  ASSISTANTS: None  ANESTHESIA:   local and MAC  EBL:  Total I/O In: 981.2 [I.V.:981.2] Out: 350 [Urine:350]  BLOOD ADMINISTERED:none  DRAINS: none   LOCAL MEDICATIONS USED:  MARCAINE    and XYLOCAINE   SPECIMEN:  Source of Specimen:  L5 vertebral body  DISPOSITION OF SPECIMEN:  PATHOLOGY  COUNTS:  YES  TOURNIQUET:  * No tourniquets in log *  IMPLANTS: Bone cement  DICTATION: .Dragon Dictation  patient brought the operating room and after adequate sedation was given the patient was placed prone and C-arm brought in and with excellent visualization of L5. After patient identification and timeout procedure completed 10cc 1% Xylocaine was infiltrated on the right sideside. Next the back was prepped and draped in sterile manner and repeat timeout procedure carried out. Spinal needle was used to get local anesthetic down to the pedicle on the right side with 20 cc of half percent Sensorcaine with epinephrine and 20 cc of Xylocaine on the right side. A small incision was made on the right side and a trocar advanced in an extrapedicular fashion, biopsyobtained of the L5 body. The drilling was carried out followed by inflation of a balloon and essentially across the midline so second insertion site was not needed with about 3.5cc inflation. The cement was then mixed and inserted when it was the appropriate consistency with 4.0cc of bone cement filling the vertebral body getting very good interdigitation and coverage from superior to inferior medial right to left sides.  When the cement was set the trochar removed and permanent C-arm views obtained. Dermabond were used to close the skin followed by a Band-Aid   PLAN OF CARE:  Continue as an inpatient  PATIENT DISPOSITION:  PACU - hemodynamically stable.

## 2017-06-27 NOTE — Anesthesia Post-op Follow-up Note (Cosign Needed)
Anesthesia QCDR form completed.        

## 2017-06-27 NOTE — Transfer of Care (Signed)
Immediate Anesthesia Transfer of Care Note  Patient: Ricardo Erickson  Procedure(s) Performed: Procedure(s): KYPHOPLASTY -L5 (N/A)  Patient Location: PACU  Anesthesia Type:MAC  Level of Consciousness: awake and alert   Airway & Oxygen Therapy: Patient Spontanous Breathing and Patient connected to nasal cannula oxygen  Post-op Assessment: Report given to RN and Post -op Vital signs reviewed and stable  Post vital signs: Reviewed and stable  Last Vitals:  Vitals:   06/27/17 1400 06/27/17 1618  BP: 112/79 104/75  Pulse: 77 79  Resp: 20 (!) 24  Temp: (!) 36.3 C 36.7 C    Last Pain:  Vitals:   06/27/17 0800  TempSrc: Axillary  PainSc:       Patients Stated Pain Goal: 5 (92/92/44 [id number])  Complications: No apparent anesthesia complications

## 2017-06-27 NOTE — Progress Notes (Signed)
Patient doing better, awake and continues to have severe LBP. Plan kyphoplasty L5 later today. Discussed with hi son on the phone.

## 2017-06-27 NOTE — Progress Notes (Signed)
Patient transferred to PACU. No complications during transfer.

## 2017-06-27 NOTE — Anesthesia Preprocedure Evaluation (Signed)
Anesthesia Evaluation  Patient identified by MRN, date of birth, ID band Patient awake    Reviewed: Allergy & Precautions, NPO status , Patient's Chart, lab work & pertinent test results, reviewed documented beta blocker date and time   Airway Mallampati: III  TM Distance: >3 FB     Dental  (+) Chipped   Pulmonary COPD, Current Smoker,           Cardiovascular hypertension, Pt. on medications + dysrhythmias Atrial Fibrillation      Neuro/Psych PSYCHIATRIC DISORDERS Anxiety Depression    GI/Hepatic   Endo/Other    Renal/GU      Musculoskeletal   Abdominal   Peds  Hematology   Anesthesia Other Findings   Reproductive/Obstetrics                             Anesthesia Physical Anesthesia Plan  ASA: III  Anesthesia Plan: MAC   Post-op Pain Management:    Induction:   PONV Risk Score and Plan:   Airway Management Planned:   Additional Equipment:   Intra-op Plan:   Post-operative Plan:   Informed Consent: I have reviewed the patients History and Physical, chart, labs and discussed the procedure including the risks, benefits and alternatives for the proposed anesthesia with the patient or authorized representative who has indicated his/her understanding and acceptance.     Plan Discussed with: CRNA  Anesthesia Plan Comments:         Anesthesia Quick Evaluation

## 2017-06-27 NOTE — Progress Notes (Signed)
Patient has upper dentures and glasses at bedside. Patient daughter Jovita Gamma states patient had both upper and lower dentures in when on 1A. Between the transfers the lower dentures are now missing. 1A and 2A have been called. 1A does not have any dentures turned in. 2A will look and call unit back.

## 2017-06-27 NOTE — Anesthesia Postprocedure Evaluation (Signed)
Anesthesia Post Note  Patient: Ricardo Erickson  Procedure(s) Performed: Procedure(s) (LRB): KYPHOPLASTY -L5 (N/A)  Patient location during evaluation: PACU Anesthesia Type: MAC Level of consciousness: awake and alert Pain management: pain level controlled Vital Signs Assessment: post-procedure vital signs reviewed and stable Respiratory status: spontaneous breathing, nonlabored ventilation, respiratory function stable and patient connected to nasal cannula oxygen Cardiovascular status: stable and blood pressure returned to baseline Anesthetic complications: no     Last Vitals:  Vitals:   06/27/17 1800 06/27/17 1900  BP: 106/77 (!) 92/59  Pulse: (!) 113 89  Resp: 18 (!) 24  Temp:  36.6 C    Last Pain:  Vitals:   06/27/17 1900  TempSrc: Oral  PainSc:                  Lolitha Tortora S

## 2017-06-27 NOTE — Anesthesia Procedure Notes (Signed)
Procedure Name: MAC Performed by: Mekiyah Gladwell Pre-anesthesia Checklist: Patient identified, Emergency Drugs available, Suction available, Patient being monitored and Timeout performed Oxygen Delivery Method: Nasal cannula       

## 2017-06-27 NOTE — Care Management (Signed)
RNCM rounded on patient which was alone in ICU bed. I saw his son pass through earlier but he was not at bedside when I came in as I'd hoped.  Patient was hard to understand as he whispered to me however I was able to understand him saying that "he promised me he would say" referring to his son. I explained to patient that maybe he just stepped away for a few minutes for a bathroom break. I attempted to find son but he was not in the waiting area. Per Dr. Sammuel Bailiff they plan kyphoplasty today. RNCM to continue to follow for discharge planning.

## 2017-06-28 ENCOUNTER — Encounter: Payer: Self-pay | Admitting: Orthopedic Surgery

## 2017-06-28 DIAGNOSIS — F4310 Post-traumatic stress disorder, unspecified: Secondary | ICD-10-CM

## 2017-06-28 DIAGNOSIS — F4322 Adjustment disorder with anxiety: Secondary | ICD-10-CM

## 2017-06-28 LAB — PROCALCITONIN

## 2017-06-28 MED ORDER — AMIODARONE HCL 200 MG PO TABS
400.0000 mg | ORAL_TABLET | Freq: Two times a day (BID) | ORAL | Status: DC
  Administered 2017-06-28 – 2017-06-29 (×4): 400 mg via ORAL
  Filled 2017-06-28 (×4): qty 2

## 2017-06-28 MED ORDER — ASPIRIN EC 325 MG PO TBEC
325.0000 mg | DELAYED_RELEASE_TABLET | Freq: Every day | ORAL | Status: DC
  Administered 2017-06-28 – 2017-06-29 (×2): 325 mg via ORAL
  Filled 2017-06-28 (×2): qty 1

## 2017-06-28 MED ORDER — ALPRAZOLAM 1 MG PO TABS
1.0000 mg | ORAL_TABLET | Freq: Three times a day (TID) | ORAL | Status: DC | PRN
  Administered 2017-06-29: 1 mg via ORAL
  Filled 2017-06-28 (×2): qty 1

## 2017-06-28 MED ORDER — DIGOXIN 125 MCG PO TABS
0.1250 mg | ORAL_TABLET | Freq: Every day | ORAL | Status: DC
  Administered 2017-06-28 – 2017-06-29 (×2): 0.125 mg via ORAL
  Filled 2017-06-28 (×3): qty 1

## 2017-06-28 MED ORDER — NICOTINE 21 MG/24HR TD PT24
21.0000 mg | MEDICATED_PATCH | Freq: Every day | TRANSDERMAL | Status: DC
  Administered 2017-06-28 – 2017-06-29 (×2): 21 mg via TRANSDERMAL
  Filled 2017-06-28 (×2): qty 1

## 2017-06-28 MED ORDER — ALPRAZOLAM 0.5 MG PO TABS
0.5000 mg | ORAL_TABLET | Freq: Three times a day (TID) | ORAL | Status: DC
  Administered 2017-06-28 (×2): 0.5 mg via ORAL
  Filled 2017-06-28 (×2): qty 1

## 2017-06-28 MED ORDER — SODIUM CHLORIDE 0.9% FLUSH
3.0000 mL | Freq: Two times a day (BID) | INTRAVENOUS | Status: DC
  Administered 2017-06-29: 3 mL via INTRAVENOUS

## 2017-06-28 MED ORDER — NICOTINE POLACRILEX 2 MG MT GUM
2.0000 mg | CHEWING_GUM | OROMUCOSAL | Status: DC
  Administered 2017-06-28 – 2017-06-29 (×8): 2 mg via ORAL
  Filled 2017-06-28 (×12): qty 1

## 2017-06-28 NOTE — Progress Notes (Signed)
Per Dr simonds ok to order nicotine patch 21 mg

## 2017-06-28 NOTE — Progress Notes (Signed)
[place] at [facility]                                                                                                                                                                                  Patient Demographics   Ricardo Erickson, is a 76 y.o. male, DOB - [birth year], EGB:[id number]  Admit date - 06/24/2017   Admitting Physician Fritzi Mandes, MD  Outpatient Primary MD for the patient is Rusty Aus, MD   LOS - 4  Subjective: Patient more awake very anxious asking to have somebody sit with him and talk to them all the time   Review of Systems:    CONSTITUTIONAL: No documented fever. No fatigue, weakness. No weight gain, no weight loss.  EYES: No blurry or double vision.  ENT: No tinnitus. No postnasal drip. No redness of the oropharynx.  RESPIRATORY: No cough, no wheeze, no hemoptysis. No dyspnea.  CARDIOVASCULAR: No chest pain. No orthopnea. No palpitations. No syncope.  GASTROINTESTINAL: No nausea, no vomiting or diarrhea. No abdominal pain. No melena or hematochezia.  GENITOURINARY:  No urgency. No frequency. No dysuria. No hematuria. No obstructive symptoms. No discharge. No pain. No significant abnormal bleeding ENDOCRINE: No polyuria or nocturia. No heat or cold intolerance.  HEMATOLOGY: No anemia. No bruising. No bleeding. No purpura. No petechiae INTEGUMENTARY: No rashes. No lesions.  MUSCULOSKELETAL: No arthritis. No swelling. No gout.  NEUROLOGIC: No numbness, tingling, or ataxia. No seizure-type activity.  PSYCHIATRIC: Positive anxious   Vitals:   Vitals:   06/28/17 1100 06/28/17 1200 06/28/17 1300 06/28/17 1400  BP: 99/63 99/70 106/80 (!) 97/59  Pulse: 78 80 87 (!) 59  Resp: (!) 33 (!) 32 (!) 38 (!) 29  Temp:  97.7 F (36.5 C)    TempSrc:  Axillary    SpO2: 92% 91% 91% 92%  Weight:      Height:        Wt Readings from Last 3 Encounters:  06/24/17 216 lb 8.6 oz (98.2 kg)  06/19/17 221 lb 6.4 oz (100.4 kg)  06/08/17 220 lb  (99.8 kg)     Intake/Output Summary (Last 24 hours) at 06/28/17 1446 Last data filed at 06/28/17 1400  Gross per 24 hour  Intake          2298.05 ml  Output             1222 ml  Net          1076.05 ml    Physical Exam:   GENERAL: Currently on Precedex HEAD, EYES, EARS, NOSE AND THROAT: Atraumatic, normocephalic. Extraocular muscles are intact. Pupils equal and reactive to light. Sclerae anicteric. No conjunctival  injection. No oro-pharyngeal erythema.  NECK: Supple. There is no jugular venous distention. No bruits, no lymphadenopathy, no thyromegaly.  HEART: Irregularly regular. No murmurs, no rubs, no clicks.  LUNGS: Clear to auscultation bilaterally. No rales or rhonchi. No wheezes.  ABDOMEN: Soft, flat, nontender, nondistended. Has good bowel sounds. No hepatosplenomegaly appreciated.  EXTREMITIES: No evidence of any cyanosis, clubbing, or peripheral edema.  +2 pedal and radial pulses bilaterally.  NEUROLOGIC:Patient anxious, cranial nerves II-12 intact no focal defecits SKIN: Moist and warm with no rashes appreciated.  Psych: Anxious LN: No inguinal LN enlargement    Antibiotics   Anti-infectives    Start     Dose/Rate Route Frequency Ordered Stop   06/26/17 1300  ceFAZolin (ANCEF) IVPB 2g/100 mL premix     2 g 200 mL/hr over 30 Minutes Intravenous  Once 06/25/17 1453 06/26/17 1356      Medications   Scheduled Meds: . ALPRAZolam  0.5 mg Oral TID  . amiodarone  400 mg Oral BID  . aspirin EC  325 mg Oral Daily  . busPIRone  7.5 mg Oral BID  . digoxin  0.125 mg Oral Daily  . diltiazem  240 mg Oral Daily  . docusate sodium  100 mg Oral BID  . enoxaparin (LOVENOX) injection  40 mg Subcutaneous Q24H  . haloperidol lactate  1 mg Intravenous Once  . mometasone-formoterol  2 puff Inhalation BID  . nicotine  21 mg Transdermal Daily  . nicotine polacrilex  2 mg Oral Q4H while awake  . pramipexole  1 mg Oral QHS   Continuous Infusions: . lactated ringers 75 mL/hr at  06/28/17 1300   PRN Meds:.acetaminophen **OR** [DISCONTINUED] acetaminophen, albuterol, bisacodyl, iopamidol, ketorolac, metoCLOPramide **OR** metoCLOPramide (REGLAN) injection, morphine injection, ondansetron **OR** ondansetron (ZOFRAN) IV, oxyCODONE, polyethylene glycol   Data Review:   Micro Results Recent Results (from the past 240 hour(s))  Urine Culture     Status: None   Collection Time: 06/19/17 12:47 PM  Result Value Ref Range Status   Organism ID, Bacteria NO GROWTH  Final  MRSA PCR Screening     Status: None   Collection Time: 06/24/17 10:48 PM  Result Value Ref Range Status   MRSA by PCR NEGATIVE NEGATIVE Final    Comment:        The GeneXpert MRSA Assay (FDA approved for NASAL specimens only), is one component of a comprehensive MRSA colonization surveillance program. It is not intended to diagnose MRSA infection nor to guide or monitor treatment for MRSA infections.   Urine Culture     Status: None   Collection Time: 06/24/17 11:14 PM  Result Value Ref Range Status   Specimen Description URINE, CLEAN CATCH  Final   Special Requests Normal  Final   Culture   Final    NO GROWTH Performed at [facility], [address], [place], [place] [address]    Report Status 06/26/2017 FINAL  Final    Radiology Reports Dg Lumbar Spine 2-3 Views  Result Date: 06/27/2017 CLINICAL DATA:  L5 kyphoplasty EXAM: LUMBAR SPINE - 2-3 VIEW; DG C-ARM 61-120 MIN COMPARISON:  None. FLUOROSCOPY TIME:  Fluoroscopy Time:  2 minutes Radiation Exposure Index (if provided by the fluoroscopic device): 16.2 mGy Number of Acquired Spot Images: 2 FINDINGS: Contrast laden cement is noted within the L5 vertebral body consistent with the recent kyphoplasty. IMPRESSION: L5 kyphoplasty Electronically Signed   By: Inez Catalina M.D.   On: 06/27/2017 18:33   Dg Lumbar Spine 2-3  Views  Result Date: 06/24/2017 CLINICAL DATA:  Low back pain.  No reported injury. EXAM: LUMBAR SPINE - 2-3 VIEW  COMPARISON:  None. FINDINGS: This report assumes 5 non rib-bearing lumbar vertebrae. Lumbar vertebral body heights are preserved, with no fracture. Mild degenerative disc disease at L4-5. Moderate degenerative disc disease at L5-S1. No spondylolisthesis. Mild facet arthropathy in the lower lumbar spine. No aggressive appearing focal osseous lesions. Abdominal aortic atherosclerosis. Large focus of soft tissue prominence in the back at the level of the thoracolumbar junction. IMPRESSION: 1. No lumbar spine fracture or spondylolisthesis . 2. Mild-to-moderate lower lumbar degenerative disc disease, most prominent at L5-S1 . 3. Nonspecific large focus of soft tissue prominence in the back at the level of the thoracolumbar junction, correlate with clinical exam . 4.  Aortic Atherosclerosis (ICD10-I70.0). Electronically Signed   By: Ilona Sorrel M.D.   On: 06/24/2017 15:25   Ct Head Wo Contrast  Result Date: 06/26/2017 CLINICAL DATA:  Confusion, agitation EXAM: CT HEAD WITHOUT CONTRAST TECHNIQUE: Contiguous axial images were obtained from the base of the skull through the vertex without intravenous contrast. COMPARISON:  None. FINDINGS: Brain: No evidence of acute infarction, hemorrhage, extra-axial collection, ventriculomegaly, or mass effect. Generalized cerebral atrophy. Periventricular white matter low attenuation likely secondary to microangiopathy. Vascular: Cerebrovascular atherosclerotic calcifications are noted. Skull: Negative for fracture or focal lesion. Sinuses/Orbits: Visualized portions of the orbits are unremarkable. Visualized portions of the paranasal sinuses and mastoid air cells are unremarkable. Other: None. IMPRESSION: 1. No acute intracranial pathology. 2. Chronic microvascular disease and cerebral atrophy. Electronically Signed   By: Kathreen Devoid   On: 06/26/2017 09:57   Ct Abdomen Pelvis W Contrast  Result Date: 06/24/2017 CLINICAL DATA:  Low back pain, worsening for several weeks. EXAM:  CT ABDOMEN AND PELVIS WITH CONTRAST TECHNIQUE: Multidetector CT imaging of the abdomen and pelvis was performed using the standard protocol following bolus administration of intravenous contrast. CONTRAST:  150mL ISOVUE-300 IOPAMIDOL (ISOVUE-300) INJECTION 61% COMPARISON:  06/24/2017 lumbar spine radiographs. FINDINGS: Lower chest: No significant pulmonary nodules or acute consolidative airspace disease. Hepatobiliary: Normal liver size. Diffuse hepatic steatosis. Simple 1.2 cm lateral segment left liver lobe cyst. Three additional scattered subcentimeter hypodense liver lesions, too small to characterize. No additional liver lesions. No definite liver surface irregularity. Normal gallbladder with no radiopaque cholelithiasis. No biliary ductal dilatation. Pancreas: Normal, with no mass or duct dilation. Spleen: Normal size. No mass. Adrenals/Urinary Tract: Right adrenal 1.1 cm nodule (series 2/ image 33) with indeterminate density 65 HU. No left adrenal nodules. No hydronephrosis. Subcentimeter hypodense renal cortical lesions in the upper kidneys bilaterally are too small to characterize and require no further follow-up. Normal bladder. Stomach/Bowel: Grossly normal stomach. Normal caliber small bowel with no small bowel wall thickening. Normal appendix. Minimal sigmoid diverticulosis, with no large bowel wall thickening or pericolonic fat stranding. Vascular/Lymphatic: Atherosclerotic abdominal aorta with 2.8 cm ectatic infrarenal abdominal aorta. Patent portal, splenic and renal veins. No pathologically enlarged lymph nodes in the abdomen or pelvis. Reproductive: Moderately enlarged prostate with nonspecific internal prostatic calcifications. Other: No pneumoperitoneum, ascites or focal fluid collection. Small fat containing right inguinal hernia. Small fat containing periumbilical hernia. Musculoskeletal: No aggressive appearing focal osseous lesions. Mild compression fracture of the L5 vertebral body with  minimal 10% loss of vertebral body height. No bony retropulsion. No additional fracture. Mild to moderate thoracolumbar spondylosis. IMPRESSION: 1. Mild L5 vertebral body compression fracture, which appears acute. No aggressive appearing focal osseous lesions. 2. Indeterminate right adrenal  nodule. If the patient has a history of malignancy, adrenal protocol CT or MRI abdomen without and with IV contrast may be obtained at this time for further evaluation. Otherwise, a follow-up adrenal protocol CT abdomen may be obtained in 12 months. 3. Aortic atherosclerosis. 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 Ectatic abdominal aorta at risk for aneurysm development. Recommend followup by ultrasound in 5 years. This recommendation follows ACR consensus guidelines: [name]. J Am Coll Radiol 2013; 10:789-794. 4. Additional findings include diffuse hepatic steatosis, minimal sigmoid diverticulosis, moderately enlarged prostate and small fat containing right inguinal and periumbilical hernias. These results were called by telephone at the time of interpretation on 06/24/2017 at 6:46 pm to Dr. Jimmye Norman, who verbally acknowledged these results. Electronically Signed   By: Ilona Sorrel M.D.   On: 06/24/2017 18:49   Dg Chest Port 1 View  Result Date: 06/25/2017 CLINICAL DATA:  Severe back pain on admission yesterday. EXAM: PORTABLE CHEST 1 VIEW COMPARISON:  Chest CT dated 05/02/2017. FINDINGS: Borderline enlarged cardiac silhouette. Prominent pulmonary vasculature and interstitial markings. The lungs are mildly hyperexpanded. Lower thoracic spine degenerative changes. IMPRESSION: Borderline cardiomegaly and mild pulmonary vascular congestion with underlying COPD. Electronically Signed   By: Claudie Revering M.D.   On: 06/25/2017 07:51   Dg C-arm 1-60 Min  Result Date: 06/27/2017 CLINICAL DATA:  L5 kyphoplasty EXAM: LUMBAR SPINE - 2-3 VIEW; DG C-ARM 61-120 MIN  COMPARISON:  None. FLUOROSCOPY TIME:  Fluoroscopy Time:  2 minutes Radiation Exposure Index (if provided by the fluoroscopic device): 16.2 mGy Number of Acquired Spot Images: 2 FINDINGS: Contrast laden cement is noted within the L5 vertebral body consistent with the recent kyphoplasty. IMPRESSION: L5 kyphoplasty Electronically Signed   By: Inez Catalina M.D.   On: 06/27/2017 18:33     CBC  Recent Labs Lab 06/24/17 1528 06/24/17 2248 06/26/17 0553 06/27/17 1842  WBC 13.7* 13.8* 17.7* 15.2*  HGB 17.5 17.0 18.8* 17.7  HCT 52.1* 51.0 54.7* 52.9*  PLT 225 227 242 202  MCV 90.4 89.6 88.5 90.6  MCH 30.4 29.8 30.5 30.4  MCHC 33.6 33.2 34.4 33.5  RDW 14.9* 14.7* 14.6* 14.9*  LYMPHSABS 1.7  --   --   --   MONOABS 1.4*  --   --   --   EOSABS 0.1  --   --   --   BASOSABS 0.1  --   --   --     Chemistries   Recent Labs Lab 06/24/17 1528 06/24/17 2248 06/26/17 0553 06/26/17 0906 06/27/17 1842  NA 135 133* 137  --   --   K 3.4* 3.2* 3.7  --   --   CL 98* 97* 99*  --   --   CO2 24 25 25   --   --   GLUCOSE 104* 133* 118*  --   --   BUN 13 14 15   --   --   CREATININE 1.14 1.05 1.18  --  1.38*  CALCIUM 9.3 9.0 9.6  --   --   MG 1.8 1.7  --   --   --   AST 24  --   --  38  --   ALT 11*  --   --  12*  --   ALKPHOS 76  --   --  79  --   BILITOT 1.7*  --   --  1.3*  --    ------------------------------------------------------------------------------------------------------------------  estimated creatinine clearance is 54.3 mL/min (A) (by C-G formula based on SCr of 1.38 mg/dL (H)). ------------------------------------------------------------------------------------------------------------------ No results for input(s): HGBA1C in the last 72 hours. ------------------------------------------------------------------------------------------------------------------ No results for input(s): CHOL, HDL, LDLCALC, TRIG, CHOLHDL, LDLDIRECT in the last 72  hours. ------------------------------------------------------------------------------------------------------------------ No results for input(s): TSH, T4TOTAL, T3FREE, THYROIDAB in the last 72 hours.  Invalid input(s): FREET3 ------------------------------------------------------------------------------------------------------------------ No results for input(s): VITAMINB12, FOLATE, FERRITIN, TIBC, IRON, RETICCTPCT in the last 72 hours.  Coagulation profile No results for input(s): INR, PROTIME in the last 168 hours.  No results for input(s): DDIMER in the last 72 hours.  Cardiac Enzymes  Recent Labs Lab 06/24/17 1528 06/24/17 2248  TROPONINI <0.03 <0.03   ------------------------------------------------------------------------------------------------------------------ Invalid input(s): POCBNP    Assessment & Plan   Dimitris Shanahan  is a 76 y.o. male with a known history of Chronic back pain and Emphysema along with ongoing tobacco abuse, hypertension, depression comes to the emergency room accompanied by son and daughter-in-law after patient started having increasing back pain for last couple days. He was seen as outpatient by Dr. Sabra Heck his primary care physician and given his problems with prostate enlargement and PSA was done which was reported as 10.33. Patient was to follow-up and get a CT of the abdomen. He started having excruciating lower back pain and brought to the emergency room where CT scan of the abdomen showed L5 compression fracture with about 10% height loss.  1. A. fib with RVR  Heart rate improved Continue amiodarone orally discontinue IV I will start him on oral dig He has new systolic dysfunction will need outpatient ischemic evaluation    2. Acute encephalopathy due to patient's anxiety and pain now improved asked psych to see  3 Acute on chronic back pain due to L5 compression fracture -When necessary pain meds  status post kyphoplasty -PT  consult  4. Bilateral lower extremity edema-new Currently stable   5. Elevated PSA levels to 10.33 -Patient has history of prostate enlargement on CT renal study -He will need to follow up with urology for further workup as outpatient  6. Nicotine abuse smoking cessation provided     Code Status Orders        Start     Ordered   06/24/17 2236  Full code  Continuous     06/24/17 2235    Code Status History    Date Active Date Inactive Code Status Order ID Comments User Context   This patient has a current code status but no historical code status.       Transfer to the floor    Consults ortho ,cards   DVT Prophylaxis  Lovenox   Lab Results  Component Value Date   PLT 202 06/27/2017     Time Spent in minutes  82min  Greater than 50% of time spent in care coordination and counseling patient regarding the condition and plan of care.   Dustin Flock M.D on 06/28/2017 at 2:46 PM  Between 7am to 6pm - Pager - [phone]  After 6pm go to [email or web address] - password [email or web address] [place]   Office  [phone]

## 2017-06-28 NOTE — Care Management (Signed)
CM attempted to assess patient and he is being seen by physical therapy then to be seen by psychiatry as soon as patient gets seated in the chair.  There are order for hinm to transfer to 2A today and had bed assignment. CM performed ordered assessment after transfer.

## 2017-06-28 NOTE — Evaluation (Signed)
Physical Therapy Evaluation Patient Details Name: Ricardo Erickson MRN: [id number] DOB: 03-20-1941 Today's Date: 06/28/2017   History of Present Illness  Pt is a 76 y.o. male presenting to hospital with increasing back pain and LE swelling.  Pt found to have L5 compression fx.  Pt admitted with a-fib with RVR (new onset) and acute on chronic back pain d/t L5 compression fx.  Pt with AMS and agitation during hospital stay and transferred to CCU 06/26/17.  Pt s/p kyphoplasty 06/27/17.  PMH includes chronic back pain, depression, emphysema lung, PTSD, RLS, and COPD.  Clinical Impression  Prior to hospital admission, pt reporting increasing difficulty with functional mobility over the last 6 months (using RW in last week with 1 fall).  Pt lives alone in 1 level home with stairs to enter.  Currently pt is 2 assist with bed mobility, standing with RW, and to perform squat pivot to R to bedside chair.  Pt and pt's son reporting pt has "severe anxiety" regarding being in the hospital and pt very concerned regarding hurting his back surgery and moving in general (although pt very motivated to get OOB to chair).  Significant extra time required during session d/t pt's anxiety and pt did not want to get back to bed during session when therapist recommended it (pt focused on getting to chair d/t bed was too uncomfortable).  Nursing notified of recommendation to use mechanical hoyer lift back to bed.  Pt also demonstrating R lean during session (improved during session with cueing/traing) and also demonstrates impaired cervical ROM (different from baseline) and unable to laterally side bend c-spine to L side; able to get to neutral with visual and tactile cueing/practice but unable to maintain (see below for further details).  Pt would benefit from skilled PT to address noted impairments and functional limitations (see below for any additional details).  Upon hospital discharge, recommend pt discharge to [place].    Follow Up  Recommendations SNF    Equipment Recommendations  Rolling walker with 5" wheels    Recommendations for Other Services OT consult     Precautions / Restrictions Precautions Precautions: Fall;Back;Other (comment) Precaution Comments: spinal precautions Restrictions Weight Bearing Restrictions: No      Mobility  Bed Mobility Overal bed mobility: Needs Assistance Bed Mobility: Supine to Sit     Supine to sit: Mod assist;Max assist;+2 for physical assistance;HOB elevated     General bed mobility comments: pt able to move LE's small distance to L towards edge of bed on own and then required min to mod assist to bring LE's the rest of the way and also required assist for trunk  Transfers Overall transfer level: Needs assistance Equipment used: Rolling walker (2 wheeled) Transfers: Sit to/from [employer] Sit to Stand: Mod assist;Max assist;+2 physical assistance   Squat pivot transfers: Mod assist;+2 physical assistance     General transfer comment: pt initially mod assist x2 to stand 1st trial with RW; unable to come to full upright posture 2nd trial with max assist x2; and mod assist x2 to stand on 3rd trial.  Vc's and tactile cues required for LE and UE placement and midline trunk position (leaning towards L side d/t R sided lean).  Performed squat pivot to R bed to recliner (L armrest released down) with mod assist x2 and vc's/tactile cues for hand and LE placement; multiple scoots to R required to reach final position in chair  Ambulation/Gait Ambulation/Gait assistance: Mod assist;+2 physical assistance   Assistive device: Rolling  walker (2 wheeled)       General Gait Details: pt able to take a step with B LE's (with 2 assist with RW); vc's and tactile cues required for weight shifting and technique to perform  Stairs            Wheelchair Mobility    Modified Rankin (Stroke Patients Only)       Balance Overall balance assessment: Needs  assistance Sitting-balance support: Bilateral upper extremity supported Sitting balance-Leahy Scale: Fair Sitting balance - Comments: static sitting pt initially leaning heavily to R side requiring vc's and tactile cues to correct (performed positioning with pillows under B UE's, reaching to L side multiple times to promote L lean, tactile and visual cues for midline positioning Postural control: Right lateral lean Standing balance support: Bilateral upper extremity supported Standing balance-Leahy Scale: Poor Standing balance comment: static standing with RW and 2 assist for safety                             Pertinent Vitals/Pain Pain Assessment: 0-10 Pain Score: 1  Pain Location: low back pain Pain Descriptors / Indicators: Sore Pain Intervention(s): Limited activity within patient's tolerance;Monitored during session;Premedicated before session;Repositioned     Home Living Family/patient expects to be discharged to:: Private residence Living Arrangements: Alone Available Help at Discharge: Family Type of Home: House Home Access: Stairs to enter Entrance Stairs-Rails: Right;Left;Can reach both Entrance Stairs-Number of Steps: 2 Home Layout: One level Home Equipment: Environmental consultant - 2 wheels;Shower seat      Prior Function Level of Independence: Independent with assistive device(s)         Comments: Pt reports decreasing mobility for past 6 months (used [facility] for about a month before switching to RW for past week d/t low back pain); pt reports sliding feet on floor to walk d/t weakness.  1 fall in last week (none prior in last 6 months).     Hand Dominance        Extremity/Trunk Assessment   Upper Extremity Assessment Upper Extremity Assessment:  (good B hand grip; at least 3/5 B elbow flexion/extension; B shoulder flexion at least 90 degrees AROM)    Lower Extremity Assessment Lower Extremity Assessment:  (at least 3/5 B ankle PF/DF; knee flexion/extension at  least 3/5 AROM; hip flexion 2+/5 B)    Cervical / Trunk Assessment Cervical / Trunk Assessment: Other exceptions Cervical / Trunk Exceptions: cervical side bend to R with head rotated to L laying in bed and in sitting (with max cueing and tactile cues only able to get head to midline; not able to perform L cervical sidebend); able to turn/rotate head to R and to L side AROM; forward head noted  Communication   Communication: HOH  Cognition Arousal/Alertness: Awake/alert Behavior During Therapy: Anxious Overall Cognitive Status: Impaired/Different from baseline Area of Impairment:  (oriented to person, place, time, and situation)                               General Comments: General confusion noted intermittently during session.      General Comments General comments (skin integrity, edema, etc.): Pt's son present during session.    Exercises  Sitting ex's to promote cervical ROM and neutral positioning of c-spine and also sitting posture (d/t R lean); transfer training (see above for details).   Assessment/Plan    PT Assessment Patient needs continued PT  services  PT Problem List Decreased strength;Decreased range of motion;Decreased activity tolerance;Decreased balance;Decreased mobility;Decreased cognition;Decreased knowledge of use of DME;Decreased safety awareness;Decreased knowledge of precautions;Pain       PT Treatment Interventions DME instruction;Gait training;Stair training;Functional mobility training;Therapeutic activities;Therapeutic exercise;Balance training;Neuromuscular re-education;Cognitive remediation;Patient/family education    PT Goals (Current goals can be found in the Care Plan section)  Acute Rehab PT Goals Patient Stated Goal: to be able to walk again PT Goal Formulation: With patient/family Time For Goal Achievement: 07/12/17 Potential to Achieve Goals: Good    Frequency 7X/week   Barriers to discharge Decreased caregiver support       Co-evaluation               AM-PAC PT "6 Clicks" Daily Activity  Outcome Measure Difficulty turning over in bed (including adjusting bedclothes, sheets and blankets)?: Total Difficulty moving from lying on back to sitting on the side of the bed? : Total Difficulty sitting down on and standing up from a chair with arms (e.g., wheelchair, bedside commode, etc,.)?: Total Help needed moving to and from a bed to chair (including a wheelchair)?: Total Help needed walking in hospital room?: Total Help needed climbing 3-5 steps with a railing? : Total 6 Click Score: 6    End of Session Equipment Utilized During Treatment: Gait belt (up high off of incision) Activity Tolerance: Patient tolerated treatment well Patient left: in chair;with call bell/phone within reach;with chair alarm set;with family/visitor present (Psychiatry present); pillow placed on pt's R side of trunk to promote neutral position in chair Nurse Communication: Mobility status;Need for lift equipment;Precautions (Hoyer back to bed) PT Visit Diagnosis: Other abnormalities of gait and mobility (R26.89);History of falling (Z91.81);Muscle weakness (generalized) (M62.81)    Time: 3903-0092 PT Time Calculation (min) (ACUTE ONLY): 70 min   Charges:   PT Evaluation $PT Eval Low Complexity: 1 Procedure PT Treatments $Therapeutic Activity: 38-52 mins   PT G CodesLeitha Bleak, PT 06/28/17, 4:57 PM [phone]

## 2017-06-28 NOTE — Progress Notes (Signed)
CH received a PG at 2255 on 27 June 2017 to come talk with PT. [place] arrived at room and found PT awake and wanting to talk because he was lonely and scared. [place] talked with PT an   06/28/17 0728  Clinical Encounter Type  Visited With Patient  Visit Type Initial  Referral From Nurse  Consult/Referral To Chaplain  Spiritual Encounters  Spiritual Needs Prayer;Emotional  d prayed with him.

## 2017-06-28 NOTE — Progress Notes (Addendum)
Writer noted hardened red area on patients forearm, just distal to [facility], NP assessed site, feels it may be d/t a venipuncture that was performed distal to the hardened area, writer applied a warm compress per order. Pt has not urinated since foley pulled approx 1300. Writer bladder scanned him (296 ccs) and gave him 8 ozs cranberry juice.  Per NP Hinton Dyer we will give him the full 8 hours to urinate d/t his extreme anxiety and PTSD.  Pt will transfer to 2A telemetry; report called to [facility].  Pt prefers to stay in his recliner.  Will transport him in it, and change out chairs.

## 2017-06-28 NOTE — Progress Notes (Signed)
Chaplain received a page to visit with pt in room IC18. Pt had multiple health problems. Pt shared information about his life and family with the Chaplain. Pt was eager to be discharged and spoke about his concerns. Pt did not want to be left alone. Chaplain provided emotional support.    06/28/17 1030  Clinical Encounter Type  Visited With Patient  Visit Type Initial;Spiritual support  Referral From Nurse  Consult/Referral To Chaplain  Spiritual Encounters  Spiritual Needs Emotional

## 2017-06-28 NOTE — Progress Notes (Signed)
[facility] Encounter Note  Patient: Ricardo Erickson / Admit Date: 06/24/2017 / Date of Encounter: 06/28/2017, 11:17 AM   Subjective: Patient with more controlled heart rate with adjustments of medication management multifactorial in nature including significant low back pain and or other emphysema and hypertension Echo with moderate global dysfunciton ef 35%  Review of Systems:  patient is alert and feeling better Objective: Telemetry: Atrial fibrillation with controlled ventricular rate Physical Exam: Blood pressure 101/75, pulse 78, temperature (!) 97.5 F (36.4 C), temperature source Axillary, resp. rate (!) 28, height 5\' 9"  (1.753 m), weight 98.2 kg (216 lb 8.6 oz), SpO2 90 %. Body mass index is 31.98 kg/m. General: Well developed, well nourished, obtunded Head: Normocephalic, atraumatic, sclera non-icteric, no xanthomas, nares are without discharge. Neck: No apparent masses Lungs: Normal respirations with no wheezes, no rhonchi, no rales , no crackles   Heart: Irregular rate and rhythm, normal S1 S2, no murmur, no rub, no gallop, PMI is normal size and placement, carotid upstroke normal without bruit, jugular venous pressure normal Abdomen: Soft, non-tender, non-distended with normoactive bowel sounds. No hepatosplenomegaly. Abdominal aorta is normal size without bruit Extremities: No edema, no clubbing, no cyanosis, no ulcers,  Peripheral: 2+ radial, 2+ femoral, 2+ dorsal pedal pulses Neuro: Alert and oriented. Moves all extremities spontaneously. Psych:    Responds to questions appropriately with a normal affect.   Intake/Output Summary (Last 24 hours) at 06/28/17 1117 Last data filed at 06/28/17 0800  Gross per 24 hour  Intake          1686.05 ml  Output              972 ml  Net           714.05 ml    Inpatient Medications:  . ALPRAZolam  0.5 mg Oral TID  . amiodarone  400 mg Oral BID  . aspirin EC  325 mg Oral Daily  . busPIRone  7.5 mg Oral BID   . digoxin  0.125 mg Oral Daily  . diltiazem  240 mg Oral Daily  . docusate sodium  100 mg Oral BID  . enoxaparin (LOVENOX) injection  40 mg Subcutaneous Q24H  . haloperidol lactate  1 mg Intravenous Once  . mometasone-formoterol  2 puff Inhalation BID  . nicotine  21 mg Transdermal Daily  . nicotine polacrilex  2 mg Oral Q4H while awake  . pramipexole  1 mg Oral QHS   Infusions:  . lactated ringers 75 mL/hr at 06/28/17 0800    Labs:  Recent Labs  06/26/17 0553 06/27/17 1842  NA 137  --   K 3.7  --   CL 99*  --   CO2 25  --   GLUCOSE 118*  --   BUN 15  --   CREATININE 1.18 1.38*  CALCIUM 9.6  --     Recent Labs  06/26/17 0906  AST 38  ALT 12*  ALKPHOS 79  BILITOT 1.3*  PROT 6.5  ALBUMIN 3.7    Recent Labs  06/26/17 0553 06/27/17 1842  WBC 17.7* 15.2*  HGB 18.8* 17.7  HCT 54.7* 52.9*  MCV 88.5 90.6  PLT 242 202   No results for input(s): CKTOTAL, CKMB, TROPONINI in the last 72 hours. Invalid input(s): POCBNP No results for input(s): HGBA1C in the last 72 hours.   Weights: Filed Weights   06/24/17 1356 06/24/17 2234  Weight: 100.2 kg (221 lb) 98.2 kg (216 lb 8.6 oz)  Radiology/Studies:  Dg Lumbar Spine 2-3 Views  Result Date: 06/27/2017 CLINICAL DATA:  L5 kyphoplasty EXAM: LUMBAR SPINE - 2-3 VIEW; DG C-ARM 61-120 MIN COMPARISON:  None. FLUOROSCOPY TIME:  Fluoroscopy Time:  2 minutes Radiation Exposure Index (if provided by the fluoroscopic device): 16.2 mGy Number of Acquired Spot Images: 2 FINDINGS: Contrast laden cement is noted within the L5 vertebral body consistent with the recent kyphoplasty. IMPRESSION: L5 kyphoplasty Electronically Signed   By: Inez Catalina M.D.   On: 06/27/2017 18:33   Dg Lumbar Spine 2-3 Views  Result Date: 06/24/2017 CLINICAL DATA:  Low back pain.  No reported injury. EXAM: LUMBAR SPINE - 2-3 VIEW COMPARISON:  None. FINDINGS: This report assumes 5 non rib-bearing lumbar vertebrae. Lumbar vertebral body heights are  preserved, with no fracture. Mild degenerative disc disease at L4-5. Moderate degenerative disc disease at L5-S1. No spondylolisthesis. Mild facet arthropathy in the lower lumbar spine. No aggressive appearing focal osseous lesions. Abdominal aortic atherosclerosis. Large focus of soft tissue prominence in the back at the level of the thoracolumbar junction. IMPRESSION: 1. No lumbar spine fracture or spondylolisthesis . 2. Mild-to-moderate lower lumbar degenerative disc disease, most prominent at L5-S1 . 3. Nonspecific large focus of soft tissue prominence in the back at the level of the thoracolumbar junction, correlate with clinical exam . 4.  Aortic Atherosclerosis (ICD10-I70.0). Electronically Signed   By: Ilona Sorrel M.D.   On: 06/24/2017 15:25   Ct Head Wo Contrast  Result Date: 06/26/2017 CLINICAL DATA:  Confusion, agitation EXAM: CT HEAD WITHOUT CONTRAST TECHNIQUE: Contiguous axial images were obtained from the base of the skull through the vertex without intravenous contrast. COMPARISON:  None. FINDINGS: Brain: No evidence of acute infarction, hemorrhage, extra-axial collection, ventriculomegaly, or mass effect. Generalized cerebral atrophy. Periventricular white matter low attenuation likely secondary to microangiopathy. Vascular: Cerebrovascular atherosclerotic calcifications are noted. Skull: Negative for fracture or focal lesion. Sinuses/Orbits: Visualized portions of the orbits are unremarkable. Visualized portions of the paranasal sinuses and mastoid air cells are unremarkable. Other: None. IMPRESSION: 1. No acute intracranial pathology. 2. Chronic microvascular disease and cerebral atrophy. Electronically Signed   By: Kathreen Devoid   On: 06/26/2017 09:57   Ct Abdomen Pelvis W Contrast  Result Date: 06/24/2017 CLINICAL DATA:  Low back pain, worsening for several weeks. EXAM: CT ABDOMEN AND PELVIS WITH CONTRAST TECHNIQUE: Multidetector CT imaging of the abdomen and pelvis was performed using  the standard protocol following bolus administration of intravenous contrast. CONTRAST:  175mL ISOVUE-300 IOPAMIDOL (ISOVUE-300) INJECTION 61% COMPARISON:  06/24/2017 lumbar spine radiographs. FINDINGS: Lower chest: No significant pulmonary nodules or acute consolidative airspace disease. Hepatobiliary: Normal liver size. Diffuse hepatic steatosis. Simple 1.2 cm lateral segment left liver lobe cyst. Three additional scattered subcentimeter hypodense liver lesions, too small to characterize. No additional liver lesions. No definite liver surface irregularity. Normal gallbladder with no radiopaque cholelithiasis. No biliary ductal dilatation. Pancreas: Normal, with no mass or duct dilation. Spleen: Normal size. No mass. Adrenals/Urinary Tract: Right adrenal 1.1 cm nodule (series 2/ image 33) with indeterminate density 65 HU. No left adrenal nodules. No hydronephrosis. Subcentimeter hypodense renal cortical lesions in the upper kidneys bilaterally are too small to characterize and require no further follow-up. Normal bladder. Stomach/Bowel: Grossly normal stomach. Normal caliber small bowel with no small bowel wall thickening. Normal appendix. Minimal sigmoid diverticulosis, with no large bowel wall thickening or pericolonic fat stranding. Vascular/Lymphatic: Atherosclerotic abdominal aorta with 2.8 cm ectatic infrarenal abdominal aorta. Patent portal, splenic and renal veins. No  pathologically enlarged lymph nodes in the abdomen or pelvis. Reproductive: Moderately enlarged prostate with nonspecific internal prostatic calcifications. Other: No pneumoperitoneum, ascites or focal fluid collection. Small fat containing right inguinal hernia. Small fat containing periumbilical hernia. Musculoskeletal: No aggressive appearing focal osseous lesions. Mild compression fracture of the L5 vertebral body with minimal 10% loss of vertebral body height. No bony retropulsion. No additional fracture. Mild to moderate thoracolumbar  spondylosis. IMPRESSION: 1. Mild L5 vertebral body compression fracture, which appears acute. No aggressive appearing focal osseous lesions. 2. Indeterminate right adrenal nodule. If the patient has a history of malignancy, adrenal protocol CT or MRI abdomen without and with IV contrast may be obtained at this time for further evaluation. Otherwise, a follow-up adrenal protocol CT abdomen may be obtained in 12 months. 3. Aortic atherosclerosis. 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 Ectatic abdominal aorta at risk for aneurysm development. Recommend followup by ultrasound in 5 years. This recommendation follows ACR consensus guidelines: [name]. J Am Coll Radiol 2013; 10:789-794. 4. Additional findings include diffuse hepatic steatosis, minimal sigmoid diverticulosis, moderately enlarged prostate and small fat containing right inguinal and periumbilical hernias. These results were called by telephone at the time of interpretation on 06/24/2017 at 6:46 pm to Dr. Jimmye Norman, who verbally acknowledged these results. Electronically Signed   By: Ilona Sorrel M.D.   On: 06/24/2017 18:49   Dg Chest Port 1 View  Result Date: 06/25/2017 CLINICAL DATA:  Severe back pain on admission yesterday. EXAM: PORTABLE CHEST 1 VIEW COMPARISON:  Chest CT dated 05/02/2017. FINDINGS: Borderline enlarged cardiac silhouette. Prominent pulmonary vasculature and interstitial markings. The lungs are mildly hyperexpanded. Lower thoracic spine degenerative changes. IMPRESSION: Borderline cardiomegaly and mild pulmonary vascular congestion with underlying COPD. Electronically Signed   By: Claudie Revering M.D.   On: 06/25/2017 07:51   Dg C-arm 1-60 Min  Result Date: 06/27/2017 CLINICAL DATA:  L5 kyphoplasty EXAM: LUMBAR SPINE - 2-3 VIEW; DG C-ARM 61-120 MIN COMPARISON:  None. FLUOROSCOPY TIME:  Fluoroscopy Time:  2 minutes Radiation Exposure Index (if provided by the  fluoroscopic device): 16.2 mGy Number of Acquired Spot Images: 2 FINDINGS: Contrast laden cement is noted within the L5 vertebral body consistent with the recent kyphoplasty. IMPRESSION: L5 kyphoplasty Electronically Signed   By: Inez Catalina M.D.   On: 06/27/2017 18:33     Assessment and Recommendation  76 y.o. male with significant emphysema with hypoxia essential hypertension with compression fracture causing excruciating pain with significant tachycardia slightly improved with medication management 1. Continue diltaizem digoxin and amiodarone for heart rate control of afib and possible conversion to nsr 2. Continue supportive care of back pain and emphysema and hypoxia 3. Ok for tx to floor for further amulation and possible dc to home 4.   anticoagulation for further risk reduction in stroke with atrial fibrillation although would consider only abstain if need for back surgery and/or disc treatment 5. No further diagnostics at this time 6. Plan for adjustments of meds as outpt Signed, Serafina Royals M.D. FACC

## 2017-06-28 NOTE — Progress Notes (Signed)
Per Dr Rudene Christians ok to remove foley catheter now, before physical therapy.  Have spoken with therapist and she will see patient this afternoon
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
## 2017-06-28 NOTE — Progress Notes (Signed)
Patient reports little back pain, seems much more comfortable. Will set follow up appointment in 2 weeks

## 2017-06-29 DIAGNOSIS — R972 Elevated prostate specific antigen [PSA]: Secondary | ICD-10-CM | POA: Diagnosis not present

## 2017-06-29 DIAGNOSIS — I4891 Unspecified atrial fibrillation: Secondary | ICD-10-CM | POA: Diagnosis not present

## 2017-06-29 DIAGNOSIS — F4322 Adjustment disorder with anxiety: Secondary | ICD-10-CM

## 2017-06-29 DIAGNOSIS — Z720 Tobacco use: Secondary | ICD-10-CM | POA: Diagnosis not present

## 2017-06-29 DIAGNOSIS — M8000XD Age-related osteoporosis with current pathological fracture, unspecified site, subsequent encounter for fracture with routine healing: Secondary | ICD-10-CM | POA: Diagnosis not present

## 2017-06-29 DIAGNOSIS — N400 Enlarged prostate without lower urinary tract symptoms: Secondary | ICD-10-CM | POA: Diagnosis not present

## 2017-06-29 DIAGNOSIS — S32050D Wedge compression fracture of fifth lumbar vertebra, subsequent encounter for fracture with routine healing: Secondary | ICD-10-CM | POA: Diagnosis not present

## 2017-06-29 DIAGNOSIS — D72829 Elevated white blood cell count, unspecified: Secondary | ICD-10-CM | POA: Diagnosis not present

## 2017-06-29 DIAGNOSIS — S32000A Wedge compression fracture of unspecified lumbar vertebra, initial encounter for closed fracture: Secondary | ICD-10-CM | POA: Diagnosis not present

## 2017-06-29 DIAGNOSIS — J439 Emphysema, unspecified: Secondary | ICD-10-CM | POA: Diagnosis not present

## 2017-06-29 DIAGNOSIS — F4310 Post-traumatic stress disorder, unspecified: Secondary | ICD-10-CM | POA: Diagnosis not present

## 2017-06-29 DIAGNOSIS — Z743 Need for continuous supervision: Secondary | ICD-10-CM | POA: Diagnosis not present

## 2017-06-29 DIAGNOSIS — M549 Dorsalgia, unspecified: Secondary | ICD-10-CM | POA: Diagnosis not present

## 2017-06-29 DIAGNOSIS — M5136 Other intervertebral disc degeneration, lumbar region: Secondary | ICD-10-CM | POA: Diagnosis not present

## 2017-06-29 DIAGNOSIS — I10 Essential (primary) hypertension: Secondary | ICD-10-CM | POA: Diagnosis not present

## 2017-06-29 DIAGNOSIS — F329 Major depressive disorder, single episode, unspecified: Secondary | ICD-10-CM | POA: Diagnosis not present

## 2017-06-29 DIAGNOSIS — Z4789 Encounter for other orthopedic aftercare: Secondary | ICD-10-CM | POA: Diagnosis not present

## 2017-06-29 LAB — SURGICAL PATHOLOGY

## 2017-06-29 MED ORDER — ALPRAZOLAM 1 MG PO TABS: 1.0000 mg | ORAL_TABLET | Freq: Three times a day (TID) | ORAL | 0 refills | Status: DC | PRN

## 2017-06-29 MED ORDER — OXYCODONE-ACETAMINOPHEN 5-325 MG PO TABS: 1.0000 | ORAL_TABLET | ORAL | 0 refills | Status: DC | PRN

## 2017-06-29 MED ORDER — DIGOXIN 125 MCG PO TABS: 0.1250 mg | ORAL_TABLET | Freq: Every day | ORAL | Status: DC

## 2017-06-29 MED ORDER — ASPIRIN EC 325 MG PO TBEC: 325.0000 mg | DELAYED_RELEASE_TABLET | Freq: Every day | ORAL | 3 refills | Status: DC

## 2017-06-29 MED ORDER — DOCUSATE SODIUM 100 MG PO CAPS: 100.0000 mg | ORAL_CAPSULE | Freq: Two times a day (BID) | ORAL | 0 refills | Status: DC

## 2017-06-29 MED ORDER — AMIODARONE HCL 400 MG PO TABS: 400.0000 mg | ORAL_TABLET | Freq: Two times a day (BID) | ORAL | Status: DC

## 2017-06-29 MED ORDER — DILTIAZEM HCL ER COATED BEADS 240 MG PO CP24: 240.0000 mg | ORAL_CAPSULE | Freq: Every day | ORAL | Status: DC

## 2017-06-29 MED ORDER — FLEET ENEMA 7-19 GM/118ML RE ENEM
1.0000 | ENEMA | RECTAL | Status: AC
  Administered 2017-06-29: 1 via RECTAL

## 2017-06-29 MED ORDER — HALOPERIDOL LACTATE 5 MG/ML IJ SOLN: 2.0000 mg | Freq: Four times a day (QID) | INTRAMUSCULAR | Status: DC | PRN

## 2017-06-29 MED ORDER — POLYETHYLENE GLYCOL 3350 17 G PO PACK: 17.0000 g | PACK | Freq: Every day | ORAL | 0 refills | Status: DC | PRN

## 2017-06-29 MED ORDER — MAGNESIUM CITRATE PO SOLN
1.0000 | Freq: Once | ORAL | Status: AC
  Administered 2017-06-29: 1 via ORAL
  Filled 2017-06-29: qty 296

## 2017-06-29 MED ORDER — MINERAL OIL RE ENEM
1.0000 | ENEMA | Freq: Once | RECTAL | Status: AC
  Administered 2017-06-29: 1 via RECTAL

## 2017-06-29 NOTE — Care Management Important Message (Signed)
Important Message  Patient Details  Name: Ricardo Erickson MRN: [id number] Date of Birth: 07/03/1941   Medicare Important Message Given:  Yes Signed IM notice given to son  Katrina Stack, RN 06/29/2017, 4:03 PM

## 2017-06-29 NOTE — Clinical Social Work Placement (Addendum)
   CLINICAL SOCIAL WORK PLACEMENT  NOTE  Date:  06/29/2017  Patient Details  Name: Ricardo Erickson MRN: [id number] Date of Birth: Jun 20, 1941  Clinical Social Work is seeking post-discharge placement for this patient at the [place] level of care (*CSW will initial, date and re-position this form in  chart as items are completed):  Yes   Patient/family provided with [facility] Work Department's list of facilities offering this level of care within the geographic area requested by the patient (or if unable, by the patient's family).  Yes   Patient/family informed of their freedom to choose among providers that offer the needed level of care, that participate in Medicare, Medicaid or managed care program needed by the patient, have an available bed and are willing to accept the patient.  Yes   Patient/family informed of [facility]'s ownership interest in [facility] and [facility], as well as of the fact that they are under no obligation to receive care at these facilities.  PASRR submitted to EDS on 06/29/17     PASRR number received on 06/29/17     Existing PASRR number confirmed on       FL2 transmitted to all facilities in geographic area requested by pt/family on 06/29/17     FL2 transmitted to all facilities within larger geographic area on       Patient informed that his/her managed care company has contracts with or will negotiate with certain facilities, including the following:        Yes   Patient/family informed of bed offers received.  Patient chooses bed at [facility]     Physician recommends and patient chooses bed at      Patient to be transferred to [facility] on 06/29/17.  Patient to be transferred to facility by [facility]     Patient family notified on 06/29/17 of transfer.  Name of family member notified:  Omare Bilotta patient's son     PHYSICIAN Please sign FL2      Additional Comment:    _______________________________________________ Ross Ludwig, LCSWA 06/29/2017, 4:56 PM

## 2017-06-29 NOTE — Discharge Summary (Signed)
[place] at [facility], 76 y.o., DOB 1941/05/18, MRN [id number]. Admission date: 06/24/2017 Discharge Date 06/29/2017 Primary MD Rusty Aus, MD Admitting Physician Fritzi Mandes, MD  Admission Diagnosis  Weakness [R53.1] SOB (shortness of breath) [R06.02] Rapid atrial fibrillation (HCC) [I48.91] Left low back pain, unspecified chronicity, with sciatica presence unspecified [M54.5] Closed compression fracture of fifth lumbar vertebra, initial encounter ([place]) [S32.050A] A-fib (HCC) [I48.91]  Discharge Diagnosis   Principal Problem: Atrial fibrillation with rapid ventricular rate Acute encephalopathy due to anxiety  Adjustment disorder with anxiety Closed compression fracture of L5 lumbar vertebra (HCC) Left low back pain Weakness PTSD (post-traumatic stress disorder) Systolic dysfunction COPD Elevated PSA      Hospital Course  Jerri Hargadon  is a 76 y.o. male with a known history of Chronic back pain and Emphysema along with ongoing tobacco abuse, hypertension, depression comes to the emergency room accompanied by son and daughter-in-law after patient started having increasing back pain for last couple days. Patient was noted to have severe compression fracture on evaluation. He was admitted for further therapy. However he was also noticed to have A. fib with RVR he was started on Cardizem. His heart rate improved but then he started having severe pain he had to be transferred back to the ICU. When he was placed on a Precedex drip. He was seen in consultation by Dr. Rudene Christians performed back surgery for his fracture. And is very weak and deconditioned in need of further therapy. Therefore this is being discharged to skilled nursing facility.            Consults  orthopedic surgery  Significant Tests:  See full reports for all details     Dg Lumbar Spine 2-3 Views  Result Date: 06/27/2017 CLINICAL DATA:  L5 kyphoplasty EXAM: LUMBAR  SPINE - 2-3 VIEW; DG C-ARM 61-120 MIN COMPARISON:  None. FLUOROSCOPY TIME:  Fluoroscopy Time:  2 minutes Radiation Exposure Index (if provided by the fluoroscopic device): 16.2 mGy Number of Acquired Spot Images: 2 FINDINGS: Contrast laden cement is noted within the L5 vertebral body consistent with the recent kyphoplasty. IMPRESSION: L5 kyphoplasty Electronically Signed   By: Inez Catalina M.D.   On: 06/27/2017 18:33   Dg Lumbar Spine 2-3 Views  Result Date: 06/24/2017 CLINICAL DATA:  Low back pain.  No reported injury. EXAM: LUMBAR SPINE - 2-3 VIEW COMPARISON:  None. FINDINGS: This report assumes 5 non rib-bearing lumbar vertebrae. Lumbar vertebral body heights are preserved, with no fracture. Mild degenerative disc disease at L4-5. Moderate degenerative disc disease at L5-S1. No spondylolisthesis. Mild facet arthropathy in the lower lumbar spine. No aggressive appearing focal osseous lesions. Abdominal aortic atherosclerosis. Large focus of soft tissue prominence in the back at the level of the thoracolumbar junction. IMPRESSION: 1. No lumbar spine fracture or spondylolisthesis . 2. Mild-to-moderate lower lumbar degenerative disc disease, most prominent at L5-S1 . 3. Nonspecific large focus of soft tissue prominence in the back at the level of the thoracolumbar junction, correlate with clinical exam . 4.  Aortic Atherosclerosis (ICD10-I70.0). Electronically Signed   By: Ilona Sorrel M.D.   On: 06/24/2017 15:25   Ct Head Wo Contrast  Result Date: 06/26/2017 CLINICAL DATA:  Confusion, agitation EXAM: CT HEAD WITHOUT CONTRAST TECHNIQUE: Contiguous axial images were obtained from the base of the skull through the vertex without intravenous contrast. COMPARISON:  None. FINDINGS: Brain: No evidence of acute infarction, hemorrhage, extra-axial collection, ventriculomegaly, or mass effect. Generalized cerebral atrophy.  Periventricular white matter low attenuation likely secondary to microangiopathy. Vascular:  Cerebrovascular atherosclerotic calcifications are noted. Skull: Negative for fracture or focal lesion. Sinuses/Orbits: Visualized portions of the orbits are unremarkable. Visualized portions of the paranasal sinuses and mastoid air cells are unremarkable. Other: None. IMPRESSION: 1. No acute intracranial pathology. 2. Chronic microvascular disease and cerebral atrophy. Electronically Signed   By: Kathreen Devoid   On: 06/26/2017 09:57   Ct Abdomen Pelvis W Contrast  Result Date: 06/24/2017 CLINICAL DATA:  Low back pain, worsening for several weeks. EXAM: CT ABDOMEN AND PELVIS WITH CONTRAST TECHNIQUE: Multidetector CT imaging of the abdomen and pelvis was performed using the standard protocol following bolus administration of intravenous contrast. CONTRAST:  12mL ISOVUE-300 IOPAMIDOL (ISOVUE-300) INJECTION 61% COMPARISON:  06/24/2017 lumbar spine radiographs. FINDINGS: Lower chest: No significant pulmonary nodules or acute consolidative airspace disease. Hepatobiliary: Normal liver size. Diffuse hepatic steatosis. Simple 1.2 cm lateral segment left liver lobe cyst. Three additional scattered subcentimeter hypodense liver lesions, too small to characterize. No additional liver lesions. No definite liver surface irregularity. Normal gallbladder with no radiopaque cholelithiasis. No biliary ductal dilatation. Pancreas: Normal, with no mass or duct dilation. Spleen: Normal size. No mass. Adrenals/Urinary Tract: Right adrenal 1.1 cm nodule (series 2/ image 33) with indeterminate density 65 HU. No left adrenal nodules. No hydronephrosis. Subcentimeter hypodense renal cortical lesions in the upper kidneys bilaterally are too small to characterize and require no further follow-up. Normal bladder. Stomach/Bowel: Grossly normal stomach. Normal caliber small bowel with no small bowel wall thickening. Normal appendix. Minimal sigmoid diverticulosis, with no large bowel wall thickening or pericolonic fat stranding.  Vascular/Lymphatic: Atherosclerotic abdominal aorta with 2.8 cm ectatic infrarenal abdominal aorta. Patent portal, splenic and renal veins. No pathologically enlarged lymph nodes in the abdomen or pelvis. Reproductive: Moderately enlarged prostate with nonspecific internal prostatic calcifications. Other: No pneumoperitoneum, ascites or focal fluid collection. Small fat containing right inguinal hernia. Small fat containing periumbilical hernia. Musculoskeletal: No aggressive appearing focal osseous lesions. Mild compression fracture of the L5 vertebral body with minimal 10% loss of vertebral body height. No bony retropulsion. No additional fracture. Mild to moderate thoracolumbar spondylosis. IMPRESSION: 1. Mild L5 vertebral body compression fracture, which appears acute. No aggressive appearing focal osseous lesions. 2. Indeterminate right adrenal nodule. If the patient has a history of malignancy, adrenal protocol CT or MRI abdomen without and with IV contrast may be obtained at this time for further evaluation. Otherwise, a follow-up adrenal protocol CT abdomen may be obtained in 12 months. 3. Aortic atherosclerosis. 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 Ectatic abdominal aorta at risk for aneurysm development. Recommend followup by ultrasound in 5 years. This recommendation follows ACR consensus guidelines: [name]. J Am Coll Radiol 2013; 10:789-794. 4. Additional findings include diffuse hepatic steatosis, minimal sigmoid diverticulosis, moderately enlarged prostate and small fat containing right inguinal and periumbilical hernias. These results were called by telephone at the time of interpretation on 06/24/2017 at 6:46 pm to Dr. Jimmye Norman, who verbally acknowledged these results. Electronically Signed   By: Ilona Sorrel M.D.   On: 06/24/2017 18:49   Dg Chest Port 1 View  Result Date: 06/25/2017 CLINICAL DATA:  Severe back pain on  admission yesterday. EXAM: PORTABLE CHEST 1 VIEW COMPARISON:  Chest CT dated 05/02/2017. FINDINGS: Borderline enlarged cardiac silhouette. Prominent pulmonary vasculature and interstitial markings. The lungs are mildly hyperexpanded. Lower thoracic spine degenerative changes. IMPRESSION: Borderline cardiomegaly and mild pulmonary vascular congestion  with underlying COPD. Electronically Signed   By: Claudie Revering M.D.   On: 06/25/2017 07:51   Dg C-arm 1-60 Min  Result Date: 06/27/2017 CLINICAL DATA:  L5 kyphoplasty EXAM: LUMBAR SPINE - 2-3 VIEW; DG C-ARM 61-120 MIN COMPARISON:  None. FLUOROSCOPY TIME:  Fluoroscopy Time:  2 minutes Radiation Exposure Index (if provided by the fluoroscopic device): 16.2 mGy Number of Acquired Spot Images: 2 FINDINGS: Contrast laden cement is noted within the L5 vertebral body consistent with the recent kyphoplasty. IMPRESSION: L5 kyphoplasty Electronically Signed   By: Inez Catalina M.D.   On: 06/27/2017 18:33       Today   Subjective:   Otho Bellows  patient is very weak but denies any chest pain or shortness of breath Blood pressure 108/66, pulse 89, temperature 97.7 F (36.5 C), temperature source Oral, resp. rate 18, height 5\' 9"  (1.753 m), weight 216 lb 8.6 oz (98.2 kg), SpO2 92 %.  .  Intake/Output Summary (Last 24 hours) at 06/29/17 1512 Last data filed at 06/29/17 0910  Gross per 24 hour  Intake           1767.5 ml  Output              350 ml  Net           1417.5 ml    Exam VITAL SIGNS: Blood pressure 108/66, pulse 89, temperature 97.7 F (36.5 C), temperature source Oral, resp. rate 18, height 5\' 9"  (1.753 m), weight 216 lb 8.6 oz (98.2 kg), SpO2 92 %.  GENERAL:  76 y.o.-year-old patient lying in the bed with no acute distress.  EYES: Pupils equal, round, reactive to light and accommodation. No scleral icterus. Extraocular muscles intact.  HEENT: Head atraumatic, normocephalic. Oropharynx and nasopharynx clear.  NECK:  Supple, no jugular venous  distention. No thyroid enlargement, no tenderness.  LUNGS: Normal breath sounds bilaterally, no wheezing, rales,rhonchi or crepitation. No use of accessory muscles of respiration.  CARDIOVASCULAR: S1, S2 normal. No murmurs, rubs, or gallops.  ABDOMEN: Soft, nontender, nondistended. Bowel sounds present. No organomegaly or mass.  EXTREMITIES: No pedal edema, cyanosis, or clubbing.  NEUROLOGIC: Cranial nerves II through XII are intact. Muscle strength 5/5 in all extremities. Sensation intact. Gait not checked.  PSYCHIATRIC: The patient is alert and oriented x 3.  SKIN: No obvious rash, lesion, or ulcer.   Data Review     CBC w Diff: Lab Results  Component Value Date   WBC 15.2 (H) 06/27/2017   HGB 17.7 06/27/2017   HGB 16.3 04/10/2012   HCT 52.9 (H) 06/27/2017   HCT 48.1 04/10/2012   PLT 202 06/27/2017   PLT 158 04/10/2012   LYMPHOPCT 13 06/24/2017   MONOPCT 10 06/24/2017   EOSPCT 1 06/24/2017   BASOPCT 1 06/24/2017   CMP: Lab Results  Component Value Date   NA 137 06/26/2017   NA 136 04/10/2012   K 3.7 06/26/2017   K 3.7 04/10/2012   CL 99 (L) 06/26/2017   CL 102 04/10/2012   CO2 25 06/26/2017   CO2 24 04/10/2012   BUN 15 06/26/2017   BUN 9 04/10/2012   CREATININE 1.38 (H) 06/27/2017   CREATININE 0.85 04/10/2012   PROT 6.5 06/26/2017   PROT 6.9 04/10/2012   ALBUMIN 3.7 06/26/2017   ALBUMIN 3.3 (L) 04/10/2012   BILITOT 1.3 (H) 06/26/2017   BILITOT 0.7 04/10/2012   ALKPHOS 79 06/26/2017   ALKPHOS 78 04/10/2012   AST 38 06/26/2017   AST  34 04/10/2012   ALT 12 (L) 06/26/2017   ALT 16 04/10/2012  .  Micro Results Recent Results (from the past 240 hour(s))  MRSA PCR Screening     Status: None   Collection Time: 06/24/17 10:48 PM  Result Value Ref Range Status   MRSA by PCR NEGATIVE NEGATIVE Final    Comment:        The GeneXpert MRSA Assay (FDA approved for NASAL specimens only), is one component of a comprehensive MRSA colonization surveillance program. It  is not intended to diagnose MRSA infection nor to guide or monitor treatment for MRSA infections.   Urine Culture     Status: None   Collection Time: 06/24/17 11:14 PM  Result Value Ref Range Status   Specimen Description URINE, CLEAN CATCH  Final   Special Requests Normal  Final   Culture   Final    NO GROWTH Performed at [facility], [address], [place], [place] [address]    Report Status 06/26/2017 FINAL  Final        Code Status Orders        Start     Ordered   06/24/17 2236  Full code  Continuous     06/24/17 2235    Code Status History    Date Active Date Inactive Code Status Order ID Comments User Context   This patient has a current code status but no historical code status.    Advance Directive Documentation     Most Recent Value  Type of Advance Directive  Healthcare Power of Attorney  Pre-existing out of facility DNR order (yellow form or pink MOST form)  -  "MOST" Form in Place?  -           Contact information for follow-up providers    Rusty Aus, MD Follow up in 1 week(s).   Specialty:  Internal Medicine Contact information: [place] [address] [phone]        Corey Skains, MD Follow up in 2 week(s).   Specialty:  Cardiology Why:  afib Contact information: [facility] [place] [address] [phone]        Hessie Knows, MD Follow up in 2 week(s).   Specialty:  Orthopedic Surgery Why:  post kyphoplasty Contact information: [place] [address] [phone]            Contact information for after-discharge care    [facility] Follow up.   Specialty:  [place] information: [place] [phone]                  Discharge Medications   Allergies as of  06/29/2017   No Known Allergies     Medication List    STOP taking these medications   triamterene-hydrochlorothiazide 37.5-25 MG tablet Commonly known as:  MAXZIDE-25     TAKE these medications   ADVAIR DISKUS 250-50 MCG/DOSE Aepb Generic drug:  Fluticasone-Salmeterol Inhale 1 puff into the lungs daily. Reported on 04/01/2016   albuterol 108 (90 Base) MCG/ACT inhaler Commonly known as:  PROVENTIL HFA;VENTOLIN HFA Inhale 2 puffs into the lungs as needed for wheezing or shortness of breath.   albuterol (2.5 MG/3ML) 0.083% nebulizer solution Commonly known as:  PROVENTIL Inhale 2.5 mg into the lungs every 6 (six) hours as needed.   ALPRAZolam  1 MG tablet Commonly known as:  XANAX Take 1 tablet (1 mg total) by mouth 3 (three) times daily as needed for anxiety.   amiodarone 400 MG tablet Commonly known as:  PACERONE Take 1 tablet (400 mg total) by mouth 2 (two) times daily.   aspirin EC 325 MG tablet Take 1 tablet (325 mg total) by mouth daily.   busPIRone 7.5 MG tablet Commonly known as:  BUSPAR Take 1 tablet twice daily for anxiety.   digoxin 0.125 MG tablet Commonly known as:  LANOXIN Take 1 tablet (0.125 mg total) by mouth daily.   diltiazem 240 MG 24 hr capsule Commonly known as:  CARDIZEM CD Take 1 capsule (240 mg total) by mouth daily.   docusate sodium 100 MG capsule Commonly known as:  COLACE Take 1 capsule (100 mg total) by mouth 2 (two) times daily.   oxyCODONE-acetaminophen 5-325 MG tablet Commonly known as:  PERCOCET/ROXICET Take 1-2 tablets by mouth every 4 (four) hours as needed for severe pain.   polyethylene glycol packet Commonly known as:  MIRALAX / GLYCOLAX Take 17 g by mouth daily as needed for mild constipation.   pramipexole 1 MG tablet Commonly known as:  MIRAPEX Take 1 mg by mouth at bedtime.          Total Time in preparing paper work, data evaluation and todays exam - 35 minutes  Dustin Flock M.D on 06/29/2017 at 3:12  PM  [facility]   Office  [phone]

## 2017-06-29 NOTE — Clinical Social Work Note (Signed)
Clinical Social Work Assessment  Patient Details  Name: Ricardo Erickson MRN: [id number] Date of Birth: 12-05-1941  Date of referral:  06/29/17               Reason for consult:  Facility Placement                Permission sought to share information with:  Family Supports, Customer service manager Permission granted to share information::  Yes, Verbal Permission Granted  Name::     [place], [place] [phone] or Daine Gravel Daughter [phone]   Agency::  SNF  Relationship::     Contact Information:     Housing/Transportation Living arrangements for the past 2 months:  Single Family Home Source of Information:  Patient, Adult Children Patient Interpreter Needed:  None Criminal Activity/Legal Involvement Pertinent to Current Situation/Hospitalization:  No - Comment as needed Significant Relationships:  Adult Children Lives with:  Self Do you feel safe going back to the place where you live?  No Need for family participation in patient care:  No (Coment)  Care giving concerns:  Patient feels that he needs some short term rehab before he is able to return back home.   Social Worker assessment / plan:  Patient is a 76 year old male who is alert and oriented x4.  Patient lives alone and plans to return back home once he has received some therapy at [facility].  Patient states that he has not been at SNF before, CSW explained what to expect at SNF and what the process is for going to SNF.  Patient was explained how insurance will pay for stay at SNF.  Patient expressed that he gets a little anxious in new environments, however patient is motivated to go to SNF in order to return back home.  Patient and his family were explained role of CSW and what to expect day of discharge.  Patient and family gave CSW permission to begin bed search in [place].  Patient and family did not have any other questions or concerns.  Employment status:  Retired Office manager PT Recommendations:  [place] / Referral to community resources:  [place]  Patient/Family's Response to care:  Patient and family are in agreeement to going to SNF for short term rehab.  Patient/Family's Understanding of and Emotional Response to Diagnosis, Current Treatment, and Prognosis:  Patient expressed that he is hopeful that he will not have to be at SNF for very long.  Emotional Assessment Appearance:  Appears stated age Attitude/Demeanor/Rapport:    Affect (typically observed):  Anxious, Appropriate, Pleasant Orientation:  Oriented to Self, Oriented to Place, Oriented to  Time, Oriented to Situation Alcohol / Substance use:  Not Applicable Psych involvement (Current and /or in the community):  No (Comment)  Discharge Needs  Concerns to be addressed:  Lack of Support Readmission within the last 30 days:  No Current discharge risk:  Lives alone Barriers to Discharge:  No Barriers Identified   Anell Barr 06/29/2017, 4:46 PM

## 2017-06-29 NOTE — NC FL2 (Signed)
[place] LEVEL OF CARE SCREENING TOOL     IDENTIFICATION  Patient Name: Ricardo Erickson Birthdate: [birth year] Sex: male Admission Date (Current Location): 06/24/2017  [facility] and [place] Number:  Engineering geologist and Address:  [facility], [address] [place], [place] [address]      Provider Number: [id number]  Attending Physician Name and Address:  Dustin Flock, MD  Relative Name and Phone Number:  Bast[place], [place] [phone] or Daine Gravel Daughter [phone]     Current Level of Care: Hospital Recommended Level of Care: [place] Prior Approval Number:    Date Approved/Denied:   PASRR Number: Pending  Discharge Plan: SNF    Current Diagnoses: Patient Active Problem List   Diagnosis Date Noted  . PTSD (post-traumatic stress disorder) 06/28/2017  . Adjustment disorder with anxiety 06/28/2017  . Closed compression fracture of L5 lumbar vertebra ([place])   . Left low back pain   . Weakness   . A-fib ([place]) 06/24/2017  . Prediabetes 01/18/2017  . Prostate cancer ([place]) 01/18/2017  . Anxiety 01/18/2017  . Preventative health care 04/15/2016  . Restless leg syndrome 04/03/2016  . COPD (chronic obstructive pulmonary disease) ([place]) 04/03/2016  . BPH (benign prostatic hyperplasia) 04/03/2016  . Personal history of tobacco use, presenting hazards to health 08/06/2015    Orientation RESPIRATION BLADDER Height & Weight     Self, Time, Situation, Place  Normal Continent Weight: 216 lb 8.6 oz (98.2 kg) Height:  5\' 9"  (175.3 cm)  BEHAVIORAL SYMPTOMS/MOOD NEUROLOGICAL BOWEL NUTRITION STATUS      Continent Diet (Carb Modified)  AMBULATORY STATUS COMMUNICATION OF NEEDS Skin   Limited Assist Verbally Surgical wounds                       Personal Care Assistance Level of Assistance  Bathing, Feeding, Dressing Bathing Assistance: Limited assistance Feeding assistance: Independent Dressing  Assistance: Limited assistance     Functional Limitations Info  Sight, Speech, Hearing Sight Info: Adequate Hearing Info: Adequate Speech Info: Adequate    SPECIAL CARE FACTORS FREQUENCY  PT (By licensed PT), OT (By licensed OT)     PT Frequency: 7x a week OT Frequency: 5x a week            Contractures Contractures Info: Not present    Additional Factors Info  Code Status, Allergies, Psychotropic Code Status Info: Full Code Allergies Info: nka Psychotropic Info: haloperidol lactate (HALDOL) injection 1 mg and         Current Medications (06/29/2017):  This is the current hospital active medication list Current Facility-Administered Medications  Medication Dose Route Frequency Provider Last Rate Last Dose  . acetaminophen (TYLENOL) tablet 650 mg  650 mg Oral Q6H PRN Fritzi Mandes, MD      . albuterol (PROVENTIL) (2.5 MG/3ML) 0.083% nebulizer solution 2.5 mg  2.5 mg Inhalation Q4H PRN Harrie Foreman, MD      . ALPRAZolam Duanne Moron) tablet 1 mg  1 mg Oral TID PRN Clapacs, Madie Reno, MD   1 mg at 06/29/17 0104  . amiodarone (PACERONE) tablet 400 mg  400 mg Oral BID Dustin Flock, MD   400 mg at 06/29/17 0911  . aspirin EC tablet 325 mg  325 mg Oral Daily Dustin Flock, MD   325 mg at 06/29/17 0912  . bisacodyl (DULCOLAX) EC tablet 5 mg  5 mg Oral Daily PRN Fritzi Mandes, MD   5 mg at 06/28/17 1856  .  digoxin (LANOXIN) tablet 0.125 mg  0.125 mg Oral Daily Dustin Flock, MD   0.125 mg at 06/29/17 0924  . diltiazem (CARDIZEM CD) 24 hr capsule 240 mg  240 mg Oral Daily Corey Skains, MD   240 mg at 06/29/17 0912  . docusate sodium (COLACE) capsule 100 mg  100 mg Oral BID Fritzi Mandes, MD   100 mg at 06/29/17 0912  . enoxaparin (LOVENOX) injection 40 mg  40 mg Subcutaneous Q24H Hessie Knows, MD   40 mg at 06/29/17 0910  . haloperidol lactate (HALDOL) injection 1 mg  1 mg Intravenous Once Harrie Foreman, MD      . iopamidol (ISOVUE-300) 61 % injection 30 mL  30 mL Oral Once  PRN Delman Kitten, MD      . ketorolac (TORADOL) 15 MG/ML injection 15 mg  15 mg Intravenous Q6H PRN Fritzi Mandes, MD   15 mg at 06/26/17 0447  . lactated ringers infusion   Intravenous Continuous Awilda Bill, NP 75 mL/hr at 06/28/17 1856    . metoCLOPramide (REGLAN) tablet 5-10 mg  5-10 mg Oral Q8H PRN Hessie Knows, MD       Or  . metoCLOPramide (REGLAN) injection 5-10 mg  5-10 mg Intravenous Q8H PRN Hessie Knows, MD      . mometasone-formoterol [facility]) 200-5 MCG/ACT inhaler 2 puff  2 puff Inhalation BID Harrie Foreman, MD   2 puff at 06/29/17 0800  . morphine 2 MG/ML injection 2 mg  2 mg Intravenous Q1H PRN Flora Lipps, MD      . nicotine (NICODERM CQ - dosed in mg/24 hours) patch 21 mg  21 mg Transdermal Daily Wilhelmina Mcardle, MD   21 mg at 06/29/17 0913  . nicotine polacrilex (NICORETTE) gum 2 mg  2 mg Oral Q4H while awake Dustin Flock, MD   2 mg at 06/29/17 0919  . ondansetron (ZOFRAN) tablet 4 mg  4 mg Oral Q6H PRN Fritzi Mandes, MD       Or  . ondansetron [facility]) injection 4 mg  4 mg Intravenous Q6H PRN Fritzi Mandes, MD   4 mg at 06/26/17 1938  . oxyCODONE (Oxy IR/ROXICODONE) immediate release tablet 5 mg  5 mg Oral Q4H PRN Fritzi Mandes, MD   5 mg at 06/29/17 0643  . polyethylene glycol (MIRALAX / GLYCOLAX) packet 17 g  17 g Oral Daily PRN Fritzi Mandes, MD      . pramipexole (MIRAPEX) tablet 1 mg  1 mg Oral QHS Harrie Foreman, MD   1 mg at 06/28/17 2102  . sodium chloride flush (NS) 0.9 % injection 3 mL  3 mL Intravenous Q12H Dustin Flock, MD         Discharge Medications: Please see discharge summary for a list of discharge medications.  Relevant Imaging Results:  Relevant Lab Results:   Additional Information SSN [id number]  Ross Ludwig, [place]

## 2017-06-29 NOTE — Progress Notes (Signed)
Physical Therapy Treatment Patient Details Name: Ricardo Erickson MRN: [id number] DOB: December 24, 1940 Today's Date: 06/29/2017    History of Present Illness Pt is a 76 y.o. male presenting to hospital with increasing back pain and LE swelling.  Pt found to have L5 compression fx.  Pt admitted with a-fib with RVR (new onset) and acute on chronic back pain d/t L5 compression fx.  Pt with AMS and agitation during hospital stay and transferred to CCU 06/26/17.  Pt s/p kyphoplasty 06/27/17.  PMH includes chronic back pain, depression, emphysema lung, PTSD, RLS, and COPD.    PT Comments    Pt continues to require increased time for all mobility (complicated d/t pt's anxiety) but pt continues to be very motivated to get from the bed to the chair (and improve his mobility).  Pt demonstrates R lateral lean upon sitting up on edge of bed but able to correct with assist and cueing (initially requiring mod assist for upright but improved to close SBA with practice).  Continues to demonstrate impaired L cervical lateral flexion ROM (able to progress to midline cervical position with assist but not able to maintain).  Pt able to stand, pivot, and take a couple steps with RW bed to chair with 2 assist, cueing, and extra time.  Nursing notified of recommendation of hoyer lift back to bed.  Will continue to focus therapy on cervical ROM impairments, balance/sitting posture, and progressing functional mobility (and progressing towards ambulation) as able.    Follow Up Recommendations  SNF     Equipment Recommendations  Rolling walker with 5" wheels    Recommendations for Other Services       Precautions / Restrictions Precautions Precautions: Fall;Back;Other (comment) Precaution Comments: spinal precautions Restrictions Weight Bearing Restrictions: No    Mobility  Bed Mobility Overal bed mobility: Needs Assistance Bed Mobility: Supine to Sit     Supine to sit: Mod assist;Max assist;+2 for physical  assistance;HOB elevated     General bed mobility comments: pt able to move LE's small distance to L towards edge of bed on own and then required min to mod assist to bring LE's the rest of the way and also required assist for trunk (max assist)  Transfers Overall transfer level: Needs assistance Equipment used: Rolling walker (2 wheeled) Transfers: Sit to/from [facility] Sit to Stand: Mod assist;+2 physical assistance Stand pivot transfers: Mod assist;+2 physical assistance       General transfer comment: VCs, visual, and tactile cues provided to support feet and hand positioning; mostly pivoted on feet but able to take a couple steps with assist for weight-shifting and to increase UE support through RW  Ambulation/Gait                 Stairs            Wheelchair Mobility    Modified Rankin (Stroke Patients Only)       Balance Overall balance assessment: Needs assistance Sitting-balance support: Bilateral upper extremity supported Sitting balance-Leahy Scale: Poor Sitting balance - Comments: initial R lateral lean requiring vc's and tactile cues to correct (cues for reaching and leaning to L side; R UE positioning to support upright posture) Postural control: Right lateral lean Standing balance support: Bilateral upper extremity supported (on RW) Standing balance-Leahy Scale: Fair Standing balance comment: static standing with RW and 2 assist for safety  Cognition Arousal/Alertness: Awake/alert Behavior During Therapy: Anxious Overall Cognitive Status: Within Functional Limits for tasks assessed                                 General Comments: Increased time for pt to follow commands and problem solve (anticipate d/t anxiety)      Exercises Other Exercises Other Exercises: pt educated in pursed lip breathing to support anxiety, SOB, pain; pt able to demo understanding    General  Comments General comments (skin integrity, edema, etc.): Pt's daughter present beginning and end of session but left during mobility part of PT session.  Nursing cleared pt for participation in physical therapy.  Pt agreeable to PT session.      Pertinent Vitals/Pain Pain Assessment: 0-10 Pain Score: 6  Pain Location: 6/10 in lower back during bed mobility briefly, generally 0/10 otherwise Pain Descriptors / Indicators: Sore Pain Intervention(s): Limited activity within patient's tolerance;Monitored during session;Repositioned    Home Living Family/patient expects to be discharged to:: Private residence Living Arrangements: Alone Available Help at Discharge: Family Type of Home: House Home Access: Stairs to enter Entrance Stairs-Rails: Right;Left;Can reach both Home Layout: One level Home Equipment: Environmental consultant - 2 wheels;Shower seat;Cane - single point      Prior Function Level of Independence: Independent with assistive device(s)        PT Goals (current goals can now be found in the care plan section) Acute Rehab PT Goals Patient Stated Goal: to be able to walk again PT Goal Formulation: With patient/family Time For Goal Achievement: 07/12/17 Potential to Achieve Goals: Good Progress towards PT goals: Progressing toward goals    Frequency    7X/week      PT Plan Current plan remains appropriate    Co-evaluation   Reason for Co-Treatment: For patient/therapist safety;To address functional/ADL transfers PT goals addressed during session: Mobility/safety with mobility OT goals addressed during session: ADL's and self-care      AM-PAC PT "6 Clicks" Daily Activity  Outcome Measure  Difficulty turning over in bed (including adjusting bedclothes, sheets and blankets)?: Total Difficulty moving from lying on back to sitting on the side of the bed? : Total Difficulty sitting down on and standing up from a chair with arms (e.g., wheelchair, bedside commode, etc,.)?:  Total Help needed moving to and from a bed to chair (including a wheelchair)?: Total Help needed walking in hospital room?: Total Help needed climbing 3-5 steps with a railing? : Total 6 Click Score: 6    End of Session Equipment Utilized During Treatment: Gait belt (up high off of incision) Activity Tolerance: Patient tolerated treatment well Patient left: in chair;with call bell/phone within reach;with chair alarm set;with family/visitor present (pillow placed on pt's R side to promote upright posture) Nurse Communication: Mobility status;Need for lift equipment;Precautions (Use of hoyer lift back to bed) PT Visit Diagnosis: Other abnormalities of gait and mobility (R26.89);History of falling (Z91.81);Muscle weakness (generalized) (M62.81)     Time: 5427-0623 PT Time Calculation (min) (ACUTE ONLY): 40 min  Charges:  $Therapeutic Activity: 23-37 mins                    G CodesLeitha Bleak, PT 06/29/17, 2:33 PM [phone]

## 2017-06-29 NOTE — Clinical Social Work Note (Signed)
CSW received insurance authorization number for patient which is [id number], [employer] number has also been received.  [id number] E expiration 07-29-2017.  Patient to be d/c'ed today to [facility].  Patient and family agreeable to plans will transport via ems RN to call report room 402 at [phone].  Evette Cristal, MSW, [place]

## 2017-06-29 NOTE — Discharge Instructions (Signed)
[facility] [place] [facility] ° °DIET:  °Cardiac diet ° °DISCHARGE CONDITION:  °Stable ° °ACTIVITY:  °Activity as tolerated ° °OXYGEN:  °Home Oxygen: No. °  °Oxygen Delivery: room air ° °DISCHARGE LOCATION:  °nursing home  ° ° °ADDITIONAL DISCHARGE INSTRUCTION: ° ° °If you experience worsening of your admission symptoms, develop shortness of breath, life threatening emergency, suicidal or homicidal thoughts you must seek medical attention immediately by calling 911 or calling your MD immediately  if symptoms less severe. ° °You Must read complete instructions/literature along with all the possible adverse reactions/side effects for all the Medicines you take and that have been prescribed to you. Take any new Medicines after you have completely understood and accpet all the possible adverse reactions/side effects.  ° °Please note ° °You were cared for by a hospitalist during your hospital stay. If you have any questions about your discharge medications or the care you received while you were in the hospital after you are discharged, you can call the unit and asked to speak with the hospitalist on call if the hospitalist that took care of you is not available. Once you are discharged, your primary care physician will handle any further medical issues. Please note that NO REFILLS for any discharge medications will be authorized once you are discharged, as it is imperative that you return to your primary care physician (or establish a relationship with a primary care physician if you do not have one) for your aftercare needs so that they can reassess your need for medications and monitor your lab values. ° ° °

## 2017-06-29 NOTE — Progress Notes (Signed)
Report given [facility]. EMS non emergent to come transport patient.

## 2017-06-29 NOTE — Progress Notes (Signed)
[place] [facility]                                                                                                                                                                                  Patient Demographics   Ricardo Erickson, is a 76 y.o. male, DOB - Nov 03, 1941, PTW:[id number]  Admit date - 06/24/2017   Admitting Physician Fritzi Mandes, MD  Outpatient Primary MD for the patient is Rusty Aus, MD   LOS - 5  Subjective:  Patient currently comfortable but very weak pain improved   Review of Systems:    CONSTITUTIONAL: No documented fever. postive fatigue,Postive weakness. No weight gain, no weight loss.  EYES: No blurry or double vision.  ENT: No tinnitus. No postnasal drip. No redness of the oropharynx.  RESPIRATORY: No cough, no wheeze, no hemoptysis. No dyspnea.  CARDIOVASCULAR: No chest pain. No orthopnea. No palpitations. No syncope.  GASTROINTESTINAL: No nausea, no vomiting or diarrhea. No abdominal pain. No melena or hematochezia.  GENITOURINARY:  No urgency. No frequency. No dysuria. No hematuria. No obstructive symptoms. No discharge. No pain. No significant abnormal bleeding ENDOCRINE: No polyuria or nocturia. No heat or cold intolerance.  HEMATOLOGY: No anemia. No bruising. No bleeding. No purpura. No petechiae INTEGUMENTARY: No rashes. No lesions.  MUSCULOSKELETAL: No arthritis. No swelling. No gout.  NEUROLOGIC: No numbness, tingling, or ataxia. No seizure-type activity.  PSYCHIATRIC: Positive anxious   Vitals:   Vitals:   06/28/17 1959 06/29/17 0449 06/29/17 0743 06/29/17 1133  BP: 118/62 114/77 109/74 108/66  Pulse: 82 86 97 89  Resp: 18 18    Temp: 98 F (36.7 C) 98.6 F (37 C)  97.7 F (36.5 C)  TempSrc: Oral Oral  Oral  SpO2: 93% 92% 91% 92%  Weight:      Height:        Wt Readings from Last 3 Encounters:  06/24/17 216 lb 8.6 oz (98.2 kg)  06/19/17 221 lb 6.4 oz (100.4 kg)  06/08/17 220 lb (99.8 kg)      Intake/Output Summary (Last 24 hours) at 06/29/17 1357 Last data filed at 06/29/17 0910  Gross per 24 hour  Intake           2004.5 ml  Output              350 ml  Net           1654.5 ml    Physical Exam:   GENERAL: Not agitated HEAD, EYES, EARS, NOSE AND THROAT: Atraumatic, normocephalic. Extraocular muscles are intact. Pupils equal and reactive to light. Sclerae anicteric. No conjunctival injection. No oro-pharyngeal erythema.  NECK: Supple. There  is no jugular venous distention. No bruits, no lymphadenopathy, no thyromegaly.  HEART: Irregularly regular. No murmurs, no rubs, no clicks.  LUNGS: Clear to auscultation bilaterally. No rales or rhonchi. No wheezes.  ABDOMEN: Soft, flat, nontender, nondistended. Has good bowel sounds. No hepatosplenomegaly appreciated.  EXTREMITIES: No evidence of any cyanosis, clubbing, or peripheral edema.  +2 pedal and radial pulses bilaterally.  NEUROLOGIC:Patient anxious, cranial nerves II-12 intact no focal defecits SKIN: Moist and warm with no rashes appreciated.  Psych: Appears comfortable LN: No inguinal LN enlargement    Antibiotics   Anti-infectives    Start     Dose/Rate Route Frequency Ordered Stop   06/26/17 1300  ceFAZolin (ANCEF) IVPB 2g/100 mL premix     2 g 200 mL/hr over 30 Minutes Intravenous  Once 06/25/17 1453 06/26/17 1356      Medications   Scheduled Meds: . amiodarone  400 mg Oral BID  . aspirin EC  325 mg Oral Daily  . digoxin  0.125 mg Oral Daily  . diltiazem  240 mg Oral Daily  . docusate sodium  100 mg Oral BID  . enoxaparin (LOVENOX) injection  40 mg Subcutaneous Q24H  . haloperidol lactate  1 mg Intravenous Once  . mometasone-formoterol  2 puff Inhalation BID  . nicotine  21 mg Transdermal Daily  . nicotine polacrilex  2 mg Oral Q4H while awake  . pramipexole  1 mg Oral QHS  . sodium chloride flush  3 mL Intravenous Q12H   Continuous Infusions:  PRN Meds:.acetaminophen **OR** [DISCONTINUED]  acetaminophen, albuterol, ALPRAZolam, bisacodyl, iopamidol, ketorolac, metoCLOPramide **OR** metoCLOPramide (REGLAN) injection, morphine injection, ondansetron **OR** ondansetron (ZOFRAN) IV, oxyCODONE, polyethylene glycol   Data Review:   Micro Results Recent Results (from the past 240 hour(s))  MRSA PCR Screening     Status: None   Collection Time: 06/24/17 10:48 PM  Result Value Ref Range Status   MRSA by PCR NEGATIVE NEGATIVE Final    Comment:        The GeneXpert MRSA Assay (FDA approved for NASAL specimens only), is one component of a comprehensive MRSA colonization surveillance program. It is not intended to diagnose MRSA infection nor to guide or monitor treatment for MRSA infections.   Urine Culture     Status: None   Collection Time: 06/24/17 11:14 PM  Result Value Ref Range Status   Specimen Description URINE, CLEAN CATCH  Final   Special Requests Normal  Final   Culture   Final    NO GROWTH Performed at [facility], [address] [place], [place] [address]    Report Status 06/26/2017 FINAL  Final    Radiology Reports Dg Lumbar Spine 2-3 Views  Result Date: 06/27/2017 CLINICAL DATA:  L5 kyphoplasty EXAM: LUMBAR SPINE - 2-3 VIEW; DG C-ARM 61-120 MIN COMPARISON:  None. FLUOROSCOPY TIME:  Fluoroscopy Time:  2 minutes Radiation Exposure Index (if provided by the fluoroscopic device): 16.2 mGy Number of Acquired Spot Images: 2 FINDINGS: Contrast laden cement is noted within the L5 vertebral body consistent with the recent kyphoplasty. IMPRESSION: L5 kyphoplasty Electronically Signed   By: Inez Catalina M.D.   On: 06/27/2017 18:33   Dg Lumbar Spine 2-3 Views  Result Date: 06/24/2017 CLINICAL DATA:  Low back pain.  No reported injury. EXAM: LUMBAR SPINE - 2-3 VIEW COMPARISON:  None. FINDINGS: This report assumes 5 non rib-bearing lumbar vertebrae. Lumbar vertebral body heights are preserved, with no fracture. Mild degenerative disc disease at L4-5. Moderate  degenerative disc  disease at L5-S1. No spondylolisthesis. Mild facet arthropathy in the lower lumbar spine. No aggressive appearing focal osseous lesions. Abdominal aortic atherosclerosis. Large focus of soft tissue prominence in the back at the level of the thoracolumbar junction. IMPRESSION: 1. No lumbar spine fracture or spondylolisthesis . 2. Mild-to-moderate lower lumbar degenerative disc disease, most prominent at L5-S1 . 3. Nonspecific large focus of soft tissue prominence in the back at the level of the thoracolumbar junction, correlate with clinical exam . 4.  Aortic Atherosclerosis (ICD10-I70.0). Electronically Signed   By: Ilona Sorrel M.D.   On: 06/24/2017 15:25   Ct Head Wo Contrast  Result Date: 06/26/2017 CLINICAL DATA:  Confusion, agitation EXAM: CT HEAD WITHOUT CONTRAST TECHNIQUE: Contiguous axial images were obtained from the base of the skull through the vertex without intravenous contrast. COMPARISON:  None. FINDINGS: Brain: No evidence of acute infarction, hemorrhage, extra-axial collection, ventriculomegaly, or mass effect. Generalized cerebral atrophy. Periventricular white matter low attenuation likely secondary to microangiopathy. Vascular: Cerebrovascular atherosclerotic calcifications are noted. Skull: Negative for fracture or focal lesion. Sinuses/Orbits: Visualized portions of the orbits are unremarkable. Visualized portions of the paranasal sinuses and mastoid air cells are unremarkable. Other: None. IMPRESSION: 1. No acute intracranial pathology. 2. Chronic microvascular disease and cerebral atrophy. Electronically Signed   By: Kathreen Devoid   On: 06/26/2017 09:57   Ct Abdomen Pelvis W Contrast  Result Date: 06/24/2017 CLINICAL DATA:  Low back pain, worsening for several weeks. EXAM: CT ABDOMEN AND PELVIS WITH CONTRAST TECHNIQUE: Multidetector CT imaging of the abdomen and pelvis was performed using the standard protocol following bolus administration of intravenous contrast.  CONTRAST:  170mL ISOVUE-300 IOPAMIDOL (ISOVUE-300) INJECTION 61% COMPARISON:  06/24/2017 lumbar spine radiographs. FINDINGS: Lower chest: No significant pulmonary nodules or acute consolidative airspace disease. Hepatobiliary: Normal liver size. Diffuse hepatic steatosis. Simple 1.2 cm lateral segment left liver lobe cyst. Three additional scattered subcentimeter hypodense liver lesions, too small to characterize. No additional liver lesions. No definite liver surface irregularity. Normal gallbladder with no radiopaque cholelithiasis. No biliary ductal dilatation. Pancreas: Normal, with no mass or duct dilation. Spleen: Normal size. No mass. Adrenals/Urinary Tract: Right adrenal 1.1 cm nodule (series 2/ image 33) with indeterminate density 65 HU. No left adrenal nodules. No hydronephrosis. Subcentimeter hypodense renal cortical lesions in the upper kidneys bilaterally are too small to characterize and require no further follow-up. Normal bladder. Stomach/Bowel: Grossly normal stomach. Normal caliber small bowel with no small bowel wall thickening. Normal appendix. Minimal sigmoid diverticulosis, with no large bowel wall thickening or pericolonic fat stranding. Vascular/Lymphatic: Atherosclerotic abdominal aorta with 2.8 cm ectatic infrarenal abdominal aorta. Patent portal, splenic and renal veins. No pathologically enlarged lymph nodes in the abdomen or pelvis. Reproductive: Moderately enlarged prostate with nonspecific internal prostatic calcifications. Other: No pneumoperitoneum, ascites or focal fluid collection. Small fat containing right inguinal hernia. Small fat containing periumbilical hernia. Musculoskeletal: No aggressive appearing focal osseous lesions. Mild compression fracture of the L5 vertebral body with minimal 10% loss of vertebral body height. No bony retropulsion. No additional fracture. Mild to moderate thoracolumbar spondylosis. IMPRESSION: 1. Mild L5 vertebral body compression fracture, which  appears acute. No aggressive appearing focal osseous lesions. 2. Indeterminate right adrenal nodule. If the patient has a history of malignancy, adrenal protocol CT or MRI abdomen without and with IV contrast may be obtained at this time for further evaluation. Otherwise, a follow-up adrenal protocol CT abdomen may be obtained in 12 months. 3. Aortic atherosclerosis. 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 Ectatic  abdominal aorta at risk for aneurysm development. Recommend followup by ultrasound in 5 years. This recommendation follows ACR consensus guidelines: [name]. J Am Coll Radiol 2013; 10:789-794. 4. Additional findings include diffuse hepatic steatosis, minimal sigmoid diverticulosis, moderately enlarged prostate and small fat containing right inguinal and periumbilical hernias. These results were called by telephone at the time of interpretation on 06/24/2017 at 6:46 pm to Dr. Jimmye Norman, who verbally acknowledged these results. Electronically Signed   By: Ilona Sorrel M.D.   On: 06/24/2017 18:49   Dg Chest Port 1 View  Result Date: 06/25/2017 CLINICAL DATA:  Severe back pain on admission yesterday. EXAM: PORTABLE CHEST 1 VIEW COMPARISON:  Chest CT dated 05/02/2017. FINDINGS: Borderline enlarged cardiac silhouette. Prominent pulmonary vasculature and interstitial markings. The lungs are mildly hyperexpanded. Lower thoracic spine degenerative changes. IMPRESSION: Borderline cardiomegaly and mild pulmonary vascular congestion with underlying COPD. Electronically Signed   By: Claudie Revering M.D.   On: 06/25/2017 07:51   Dg C-arm 1-60 Min  Result Date: 06/27/2017 CLINICAL DATA:  L5 kyphoplasty EXAM: LUMBAR SPINE - 2-3 VIEW; DG C-ARM 61-120 MIN COMPARISON:  None. FLUOROSCOPY TIME:  Fluoroscopy Time:  2 minutes Radiation Exposure Index (if provided by the fluoroscopic device): 16.2 mGy Number of Acquired Spot Images: 2 FINDINGS: Contrast  laden cement is noted within the L5 vertebral body consistent with the recent kyphoplasty. IMPRESSION: L5 kyphoplasty Electronically Signed   By: Inez Catalina M.D.   On: 06/27/2017 18:33     CBC  Recent Labs Lab 06/24/17 1528 06/24/17 2248 06/26/17 0553 06/27/17 1842  WBC 13.7* 13.8* 17.7* 15.2*  HGB 17.5 17.0 18.8* 17.7  HCT 52.1* 51.0 54.7* 52.9*  PLT 225 227 242 202  MCV 90.4 89.6 88.5 90.6  MCH 30.4 29.8 30.5 30.4  MCHC 33.6 33.2 34.4 33.5  RDW 14.9* 14.7* 14.6* 14.9*  LYMPHSABS 1.7  --   --   --   MONOABS 1.4*  --   --   --   EOSABS 0.1  --   --   --   BASOSABS 0.1  --   --   --     Chemistries   Recent Labs Lab 06/24/17 1528 06/24/17 2248 06/26/17 0553 06/26/17 0906 06/27/17 1842  NA 135 133* 137  --   --   K 3.4* 3.2* 3.7  --   --   CL 98* 97* 99*  --   --   CO2 24 25 25   --   --   GLUCOSE 104* 133* 118*  --   --   BUN 13 14 15   --   --   CREATININE 1.14 1.05 1.18  --  1.38*  CALCIUM 9.3 9.0 9.6  --   --   MG 1.8 1.7  --   --   --   AST 24  --   --  38  --   ALT 11*  --   --  12*  --   ALKPHOS 76  --   --  79  --   BILITOT 1.7*  --   --  1.3*  --    ------------------------------------------------------------------------------------------------------------------ estimated creatinine clearance is 54.3 mL/min (A) (by C-G formula based on SCr of 1.38 mg/dL (H)). ------------------------------------------------------------------------------------------------------------------ No results for input(s): HGBA1C in the last 72 hours. ------------------------------------------------------------------------------------------------------------------ No results for input(s): CHOL, HDL, LDLCALC, TRIG, CHOLHDL, LDLDIRECT in the last 72 hours. ------------------------------------------------------------------------------------------------------------------ No results for input(s): TSH, T4TOTAL, T3FREE,  THYROIDAB in the last 72 hours.  Invalid input(s):  FREET3 ------------------------------------------------------------------------------------------------------------------ No results for input(s): VITAMINB12, FOLATE, FERRITIN, TIBC, IRON, RETICCTPCT in the last 72 hours.  Coagulation profile No results for input(s): INR, PROTIME in the last 168 hours.  No results for input(s): DDIMER in the last 72 hours.  Cardiac Enzymes  Recent Labs Lab 06/24/17 1528 06/24/17 2248  TROPONINI <0.03 <0.03   ------------------------------------------------------------------------------------------------------------------ Invalid input(s): POCBNP    Assessment & Plan   Jocelyn Lowery  is a 76 y.o. male with a known history of Chronic back pain and Emphysema along with ongoing tobacco abuse, hypertension, depression comes to the emergency room accompanied by son and daughter-in-law after patient started having increasing back pain for last couple days. He was seen as outpatient by Dr. Sabra Heck his primary care physician and given his problems with prostate enlargement and PSA was done which was reported as 10.33. Patient was to follow-up and get a CT of the abdomen. He started having excruciating lower back pain and brought to the emergency room where CT scan of the abdomen showed L5 compression fracture with about 10% height loss.  1. A. fib with RVR  Heart rate improved Continue amiodarone and digoxin I will start him on oral dig He has new systolic dysfunction will need outpatient ischemic evaluation Continue aspirin Is at very high fall risk and a candidate for anticoagulation at this point would consider full dose anticoagulation with future if she is stable on his feet   2. Acute encephalopathy due to patient's anxiety currently stable  3 Acute on chronic back pain due to L5 compression fracture -When necessary pain meds  status post kyphoplasty -PT recommends rehabilitation which is currently being arrange  4. Bilateral lower extremity  edema-new Currently stable   5. Elevated PSA levels to 10.33 -Patient has history of prostate enlargement on CT renal study -He will need to follow up with urology for further workup as outpatient  6. Nicotine abuse smoking cessation provided     Code Status Orders        Start     Ordered   06/24/17 2236  Full code  Continuous     06/24/17 2235    Code Status History    Date Active Date Inactive Code Status Order ID Comments User Context   This patient has a current code status but no historical code status.       Transfer to the floor    Consults ortho ,cards   DVT Prophylaxis  Lovenox   Lab Results  Component Value Date   PLT 202 06/27/2017     Time Spent in minutes  54min  Greater than 50% of time spent in care coordination and counseling patient regarding the condition and plan of care.   Dustin Flock M.D on 06/29/2017 at 1:57 PM  Between 7am to 6pm - Pager - [phone]  After 6pm go to [email or web address] - password [email or web address] [facility]   Office  [phone]
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
## 2017-06-29 NOTE — Clinical Social Work Placement (Deleted)
   CLINICAL SOCIAL WORK PLACEMENT  NOTE  Date:  06/29/2017  Patient Details  Name: Ricardo Erickson MRN: [id number] Date of Birth: [birth year]  Clinical Social Work is seeking post-discharge placement for this patient at the [place] level of care (*CSW will initial, date and re-position this form in  chart as items are completed):  Yes   Patient/family provided with [facility] Work Department's list of facilities offering this level of care within the geographic area requested by the patient (or if unable, by the patient's family).  Yes   Patient/family informed of their freedom to choose among providers that offer the needed level of care, that participate in Medicare, Medicaid or managed care program needed by the patient, have an available bed and are willing to accept the patient.  Yes   Patient/family informed of [facility]'s ownership interest in [facility] and [facility], as well as of the fact that they are under no obligation to receive care at these facilities.  PASRR submitted to EDS on 06/29/17     PASRR number received on 06/29/17     Existing PASRR number confirmed on       FL2 transmitted to all facilities in geographic area requested by pt/family on 06/29/17     FL2 transmitted to all facilities within larger geographic area on       Patient informed that his/her managed care company has contracts with or will negotiate with certain facilities, including the following:        Yes   Patient/family informed of bed offers received.  Patient chooses bed at [facility]     Physician recommends and patient chooses bed at      Patient to be transferred to [facility] on 06/29/17.  Patient to be transferred to facility by [facility]     Patient family notified on 06/29/17 of transfer.  Name of family member notified:  Madelin Headings     PHYSICIAN Please sign FL2     Additional Comment:     _______________________________________________ Ross Ludwig, LCSWA 06/29/2017, 4:56 PM

## 2017-06-29 NOTE — Evaluation (Signed)
Occupational Therapy Evaluation Patient Details Name: Ricardo Erickson MRN: [id number] DOB: 1941/12/12 Today's Date: 06/29/2017    History of Present Illness Pt is a 76 y.o. male presenting to hospital with increasing back pain and LE swelling.  Pt found to have L5 compression fx.  Pt admitted with a-fib with RVR (new onset) and acute on chronic back pain d/t L5 compression fx.  Pt with AMS and agitation during hospital stay and transferred to CCU 06/26/17.  Pt s/p kyphoplasty 06/27/17.  PMH includes chronic back pain, depression, emphysema lung, PTSD, RLS, and COPD.   Clinical Impression   Pt seen for OT evaluation and co-treatment this date. Prior to this hospital admission, pt reported increasing difficulty with functional mobility over the last 6 months (using RW in last week with 1 fall).  Pt lives alone in 1 level home with stairs to enter.  Currently pt is +2 assist with bed mobility, standing with RW, and to perform stand pivot transfer to R to bedside chair with additional time and encouragement with verbal cues for sequencing of BLE and RW. Pt currently requires mod-max assist for LB ADL tasks. Pt eager to participate and happy to be up in the recliner. Pt's cervical lean towards L shoulder currently limited and unable to support stretch past midline/neurtral. Pt will benefit from skilled OT services to address noted deficits in strength/activity tolerance/knowledge of AE/DME and functional impairments in mobility and all aspects of self care. Recommend STR following hospitalization.     Follow Up Recommendations  SNF    Equipment Recommendations  3 in 1 bedside commode    Recommendations for Other Services       Precautions / Restrictions Precautions Precautions: Fall;Back;Other (comment) Precaution Comments: spinal precautions Restrictions Weight Bearing Restrictions: No      Mobility Bed Mobility Overal bed mobility: Needs Assistance Bed Mobility: Supine to Sit     Supine  to sit: Mod assist;Max assist;+2 for physical assistance;HOB elevated        Transfers Overall transfer level: Needs assistance Equipment used: Rolling walker (2 wheeled) Transfers: Sit to/from [facility] Sit to Stand: Mod assist;+2 physical assistance Stand pivot transfers: Mod assist;+2 physical assistance       General transfer comment: VCs, visual, and tactile cues provided to support feet and hand positioning.    Balance Overall balance assessment: Needs assistance Sitting-balance support: Bilateral upper extremity supported Sitting balance-Leahy Scale: Fair Sitting balance - Comments: R lateral lean requiring VCs and tactile cues to correct (positioning of pillows under BUE Postural control: Right lateral lean Standing balance support: Bilateral upper extremity supported Standing balance-Leahy Scale: Fair Standing balance comment: static standing with RW and 2 assist for safety                           ADL either performed or assessed with clinical judgement   ADL Overall ADL's : Needs assistance/impaired Eating/Feeding: Set up;Bed level   Grooming: Bed level;Set up   Upper Body Bathing: Bed level;Set up;Minimal assistance   Lower Body Bathing: Moderate assistance;Bed level   Upper Body Dressing : Minimal assistance;Sitting   Lower Body Dressing: Maximal assistance;Bed level               Functional mobility during ADLs: Maximal assistance;+2 for physical assistance;Cueing for safety;Cueing for sequencing;Rolling walker General ADL Comments: pt generally max assist for LB ADL tasks     Vision Baseline Vision/History: Wears glasses Wears Glasses: Reading only Patient  Visual Report: No change from baseline Vision Assessment?: No apparent visual deficits     Perception     Praxis      Pertinent Vitals/Pain Pain Score: 6  Pain Location: 6/10 in lower back during bed mobility briefly, generally 0/10 otherwise Pain  Descriptors / Indicators: Sore Pain Intervention(s): Limited activity within patient's tolerance;Monitored during session;Repositioned     Hand Dominance     Extremity/Trunk Assessment Upper Extremity Assessment Upper Extremity Assessment: Generalized weakness (good grip strength, ROM WFL, and generalized weakness bilaterally)   Lower Extremity Assessment Lower Extremity Assessment: Defer to PT evaluation;Generalized weakness   Cervical / Trunk Assessment Cervical / Trunk Assessment: Other exceptions Cervical / Trunk Exceptions: cervical side bend to R with head rotated to L laying in bed and in sitting (with max cueing and tactile cues only able to get head to midline; not able to perform L cervical sidebend); able to turn/rotate head to R and to L side AROM; forward head noted   Communication Communication Communication: HOH   Cognition Arousal/Alertness: Awake/alert Behavior During Therapy: Anxious Overall Cognitive Status: Within Functional Limits for tasks assessed                                     General Comments       Exercises Other Exercises Other Exercises: pt educated in pursed lip breathing to support anxiety, SOB, pain; pt able to demo understanding   Shoulder Instructions      Home Living Family/patient expects to be discharged to:: Private residence Living Arrangements: Alone Available Help at Discharge: Family Type of Home: House Home Access: Stairs to enter Technical brewer of Steps: 2 Entrance Stairs-Rails: Right;Left;Can reach both Home Layout: One level     Bathroom Shower/Tub: Tub/shower unit;Walk-in shower (typically uses tub/shower, encouraged pt to switch to walk in)   [employer] Toilet: Standard     Home Equipment: Gilford Rile - 2 wheels;Shower seat;Cane - single point          Prior Functioning/Environment Level of Independence: Independent with assistive device(s)        Comments: Pt reports decreasing mobility  for past 6 months (used [facility] for about a month before switching to RW for past week d/t low back pain); pt reports sliding feet on floor to walk d/t weakness. Indep with basic self care, was driving and working. 1 fall in last week - pt got dizzy 2:2 medication, grandson attempted to help with transfer and both fell (none prior in last 6 months).        OT Problem List: Decreased strength;Pain;Decreased activity tolerance;Decreased safety awareness;Impaired balance (sitting and/or standing);Decreased knowledge of use of DME or AE      OT Treatment/Interventions: Self-care/ADL training;Therapeutic exercise;Therapeutic activities;Energy conservation;Patient/family education;DME and/or AE instruction    OT Goals(Current goals can be found in the care plan section) Acute Rehab OT Goals Patient Stated Goal: to be able to walk again OT Goal Formulation: With patient Time For Goal Achievement: 07/13/17 Potential to Achieve Goals: Good  OT Frequency: Min 1X/week   Barriers to D/C: Inaccessible home environment;Decreased caregiver support          Co-evaluation PT/OT/SLP Co-Evaluation/Treatment: Yes Reason for Co-Treatment: For patient/therapist safety;To address functional/ADL transfers PT goals addressed during session: Mobility/safety with mobility OT goals addressed during session: ADL's and self-care      AM-PAC PT "6 Clicks" Daily Activity     Outcome Measure Help from  another person eating meals?: None Help from another person taking care of personal grooming?: None Help from another person toileting, which includes using toliet, bedpan, or urinal?: A Lot Help from another person bathing (including washing, rinsing, drying)?: A Lot Help from another person to put on and taking off regular upper body clothing?: A Little Help from another person to put on and taking off regular lower body clothing?: A Lot 6 Click Score: 17   End of Session Equipment Utilized During Treatment: Gait  belt;Rolling walker  Activity Tolerance: Patient tolerated treatment well Patient left: in chair;with call bell/phone within reach;with chair alarm set;with family/visitor present  OT Visit Diagnosis: Other abnormalities of gait and mobility (R26.89);Muscle weakness (generalized) (M62.81);History of falling (Z91.81)                Time: 9702-6378 OT Time Calculation (min): 60 min Charges:  OT General Charges $OT Visit: 1 Procedure OT Evaluation $OT Eval Moderate Complexity: 1 Procedure OT Treatments $Self Care/Home Management : 8-22 mins G-Codes:     Jeni Salles, MPH, MS, OTR/L ascom [phone] 06/29/17, 1:57 PM

## 2017-06-29 NOTE — Plan of Care (Signed)
Problem: Activity: Goal: Ability to avoid complications of mobility impairment will improve Outcome: Not Progressing Less complaints of pain this shift.  Patient with neck aligned to right side;declines movement to correct anatomical position.

## 2017-06-30 DIAGNOSIS — I4891 Unspecified atrial fibrillation: Secondary | ICD-10-CM | POA: Diagnosis not present

## 2017-06-30 DIAGNOSIS — R972 Elevated prostate specific antigen [PSA]: Secondary | ICD-10-CM | POA: Diagnosis not present

## 2017-06-30 DIAGNOSIS — M8000XD Age-related osteoporosis with current pathological fracture, unspecified site, subsequent encounter for fracture with routine healing: Secondary | ICD-10-CM | POA: Diagnosis not present

## 2017-07-06 DIAGNOSIS — I4891 Unspecified atrial fibrillation: Secondary | ICD-10-CM | POA: Diagnosis not present

## 2017-07-06 DIAGNOSIS — R7303 Prediabetes: Secondary | ICD-10-CM | POA: Diagnosis not present

## 2017-07-06 DIAGNOSIS — F4310 Post-traumatic stress disorder, unspecified: Secondary | ICD-10-CM | POA: Diagnosis not present

## 2017-07-06 DIAGNOSIS — I509 Heart failure, unspecified: Secondary | ICD-10-CM | POA: Diagnosis not present

## 2017-07-06 DIAGNOSIS — J439 Emphysema, unspecified: Secondary | ICD-10-CM | POA: Diagnosis not present

## 2017-07-06 DIAGNOSIS — I110 Hypertensive heart disease with heart failure: Secondary | ICD-10-CM | POA: Diagnosis not present

## 2017-07-06 DIAGNOSIS — S32050D Wedge compression fracture of fifth lumbar vertebra, subsequent encounter for fracture with routine healing: Secondary | ICD-10-CM | POA: Diagnosis not present

## 2017-07-06 DIAGNOSIS — F329 Major depressive disorder, single episode, unspecified: Secondary | ICD-10-CM | POA: Diagnosis not present

## 2017-07-06 DIAGNOSIS — F4322 Adjustment disorder with anxiety: Secondary | ICD-10-CM | POA: Diagnosis not present

## 2017-07-06 DIAGNOSIS — M5136 Other intervertebral disc degeneration, lumbar region: Secondary | ICD-10-CM | POA: Diagnosis not present

## 2017-07-12 DIAGNOSIS — Z9889 Other specified postprocedural states: Secondary | ICD-10-CM | POA: Diagnosis not present

## 2017-07-17 DIAGNOSIS — Z7689 Persons encountering health services in other specified circumstances: Secondary | ICD-10-CM | POA: Diagnosis not present

## 2017-07-17 DIAGNOSIS — I4891 Unspecified atrial fibrillation: Secondary | ICD-10-CM | POA: Diagnosis not present

## 2017-07-17 DIAGNOSIS — I480 Paroxysmal atrial fibrillation: Secondary | ICD-10-CM | POA: Diagnosis not present

## 2017-07-17 DIAGNOSIS — I10 Essential (primary) hypertension: Secondary | ICD-10-CM | POA: Diagnosis not present

## 2017-07-19 ENCOUNTER — Emergency Department: Payer: Medicare Other

## 2017-07-19 ENCOUNTER — Inpatient Hospital Stay
Admission: EM | Admit: 2017-07-19 | Discharge: 2017-07-28 | DRG: 689 | Disposition: A | Discharge status: Skilled Nursing Facility | Payer: Medicare Other | Attending: Specialist | Admitting: Specialist

## 2017-07-19 DIAGNOSIS — J449 Chronic obstructive pulmonary disease, unspecified: Secondary | ICD-10-CM | POA: Diagnosis not present

## 2017-07-19 DIAGNOSIS — R531 Weakness: Secondary | ICD-10-CM | POA: Diagnosis not present

## 2017-07-19 DIAGNOSIS — I10 Essential (primary) hypertension: Secondary | ICD-10-CM | POA: Diagnosis present

## 2017-07-19 DIAGNOSIS — I2699 Other pulmonary embolism without acute cor pulmonale: Secondary | ICD-10-CM | POA: Diagnosis not present

## 2017-07-19 DIAGNOSIS — Z66 Do not resuscitate: Secondary | ICD-10-CM | POA: Diagnosis not present

## 2017-07-19 DIAGNOSIS — F419 Anxiety disorder, unspecified: Secondary | ICD-10-CM | POA: Diagnosis not present

## 2017-07-19 DIAGNOSIS — F17210 Nicotine dependence, cigarettes, uncomplicated: Secondary | ICD-10-CM | POA: Diagnosis present

## 2017-07-19 DIAGNOSIS — J440 Chronic obstructive pulmonary disease with acute lower respiratory infection: Secondary | ICD-10-CM | POA: Diagnosis not present

## 2017-07-19 DIAGNOSIS — J9601 Acute respiratory failure with hypoxia: Secondary | ICD-10-CM | POA: Diagnosis not present

## 2017-07-19 DIAGNOSIS — I4891 Unspecified atrial fibrillation: Secondary | ICD-10-CM | POA: Diagnosis present

## 2017-07-19 DIAGNOSIS — I2609 Other pulmonary embolism with acute cor pulmonale: Secondary | ICD-10-CM | POA: Diagnosis not present

## 2017-07-19 DIAGNOSIS — J439 Emphysema, unspecified: Secondary | ICD-10-CM | POA: Diagnosis not present

## 2017-07-19 DIAGNOSIS — N3090 Cystitis, unspecified without hematuria: Secondary | ICD-10-CM | POA: Diagnosis not present

## 2017-07-19 DIAGNOSIS — M549 Dorsalgia, unspecified: Secondary | ICD-10-CM | POA: Diagnosis not present

## 2017-07-19 DIAGNOSIS — R443 Hallucinations, unspecified: Secondary | ICD-10-CM | POA: Diagnosis not present

## 2017-07-19 DIAGNOSIS — N400 Enlarged prostate without lower urinary tract symptoms: Secondary | ICD-10-CM | POA: Diagnosis present

## 2017-07-19 DIAGNOSIS — G2581 Restless legs syndrome: Secondary | ICD-10-CM | POA: Diagnosis present

## 2017-07-19 DIAGNOSIS — R05 Cough: Secondary | ICD-10-CM | POA: Diagnosis not present

## 2017-07-19 DIAGNOSIS — F0390 Unspecified dementia without behavioral disturbance: Secondary | ICD-10-CM | POA: Diagnosis present

## 2017-07-19 DIAGNOSIS — J9621 Acute and chronic respiratory failure with hypoxia: Secondary | ICD-10-CM | POA: Diagnosis not present

## 2017-07-19 DIAGNOSIS — I481 Persistent atrial fibrillation: Secondary | ICD-10-CM | POA: Diagnosis not present

## 2017-07-19 DIAGNOSIS — M5136 Other intervertebral disc degeneration, lumbar region: Secondary | ICD-10-CM | POA: Diagnosis not present

## 2017-07-19 DIAGNOSIS — Z7401 Bed confinement status: Secondary | ICD-10-CM | POA: Diagnosis not present

## 2017-07-19 DIAGNOSIS — M25559 Pain in unspecified hip: Secondary | ICD-10-CM | POA: Diagnosis not present

## 2017-07-19 DIAGNOSIS — F4322 Adjustment disorder with anxiety: Secondary | ICD-10-CM | POA: Diagnosis not present

## 2017-07-19 DIAGNOSIS — R0600 Dyspnea, unspecified: Secondary | ICD-10-CM | POA: Diagnosis not present

## 2017-07-19 DIAGNOSIS — N390 Urinary tract infection, site not specified: Secondary | ICD-10-CM | POA: Diagnosis not present

## 2017-07-19 DIAGNOSIS — Z79891 Long term (current) use of opiate analgesic: Secondary | ICD-10-CM

## 2017-07-19 DIAGNOSIS — J9811 Atelectasis: Secondary | ICD-10-CM | POA: Diagnosis not present

## 2017-07-19 DIAGNOSIS — T434X5A Adverse effect of butyrophenone and thiothixene neuroleptics, initial encounter: Secondary | ICD-10-CM | POA: Diagnosis not present

## 2017-07-19 DIAGNOSIS — E876 Hypokalemia: Secondary | ICD-10-CM | POA: Diagnosis not present

## 2017-07-19 DIAGNOSIS — Z7189 Other specified counseling: Secondary | ICD-10-CM

## 2017-07-19 DIAGNOSIS — I4581 Long QT syndrome: Secondary | ICD-10-CM | POA: Diagnosis present

## 2017-07-19 DIAGNOSIS — K5900 Constipation, unspecified: Secondary | ICD-10-CM | POA: Diagnosis not present

## 2017-07-19 DIAGNOSIS — F17200 Nicotine dependence, unspecified, uncomplicated: Secondary | ICD-10-CM | POA: Diagnosis present

## 2017-07-19 DIAGNOSIS — R0602 Shortness of breath: Secondary | ICD-10-CM

## 2017-07-19 DIAGNOSIS — J189 Pneumonia, unspecified organism: Secondary | ICD-10-CM | POA: Diagnosis not present

## 2017-07-19 DIAGNOSIS — M545 Low back pain: Secondary | ICD-10-CM | POA: Diagnosis present

## 2017-07-19 DIAGNOSIS — G9349 Other encephalopathy: Secondary | ICD-10-CM | POA: Diagnosis present

## 2017-07-19 DIAGNOSIS — Z9181 History of falling: Secondary | ICD-10-CM

## 2017-07-19 DIAGNOSIS — S32050D Wedge compression fracture of fifth lumbar vertebra, subsequent encounter for fracture with routine healing: Secondary | ICD-10-CM | POA: Diagnosis not present

## 2017-07-19 DIAGNOSIS — I2690 Septic pulmonary embolism without acute cor pulmonale: Secondary | ICD-10-CM | POA: Diagnosis not present

## 2017-07-19 DIAGNOSIS — Z515 Encounter for palliative care: Secondary | ICD-10-CM | POA: Diagnosis present

## 2017-07-19 DIAGNOSIS — Z79899 Other long term (current) drug therapy: Secondary | ICD-10-CM

## 2017-07-19 DIAGNOSIS — F4310 Post-traumatic stress disorder, unspecified: Secondary | ICD-10-CM | POA: Diagnosis not present

## 2017-07-19 DIAGNOSIS — G8929 Other chronic pain: Secondary | ICD-10-CM | POA: Diagnosis present

## 2017-07-19 DIAGNOSIS — F329 Major depressive disorder, single episode, unspecified: Secondary | ICD-10-CM | POA: Diagnosis not present

## 2017-07-19 DIAGNOSIS — Z7951 Long term (current) use of inhaled steroids: Secondary | ICD-10-CM

## 2017-07-19 DIAGNOSIS — F05 Delirium due to known physiological condition: Secondary | ICD-10-CM | POA: Diagnosis not present

## 2017-07-19 DIAGNOSIS — G9340 Encephalopathy, unspecified: Secondary | ICD-10-CM | POA: Diagnosis present

## 2017-07-19 DIAGNOSIS — R4182 Altered mental status, unspecified: Secondary | ICD-10-CM | POA: Diagnosis not present

## 2017-07-19 DIAGNOSIS — Z7982 Long term (current) use of aspirin: Secondary | ICD-10-CM

## 2017-07-19 DIAGNOSIS — R079 Chest pain, unspecified: Secondary | ICD-10-CM | POA: Diagnosis not present

## 2017-07-19 DIAGNOSIS — Z981 Arthrodesis status: Secondary | ICD-10-CM

## 2017-07-19 DIAGNOSIS — Z4789 Encounter for other orthopedic aftercare: Secondary | ICD-10-CM | POA: Diagnosis not present

## 2017-07-19 LAB — CBC
HCT: 50 % (ref 40.0–52.0)
Hemoglobin: 16.6 g/dL (ref 13.0–18.0)
MCH: 30 pg (ref 26.0–34.0)
MCHC: 33.3 g/dL (ref 32.0–36.0)
MCV: 90.1 fL (ref 80.0–100.0)
Platelets: 221 10*3/uL (ref 150–440)
RBC: 5.55 MIL/uL (ref 4.40–5.90)
RDW: 14.5 % (ref 11.5–14.5)
WBC: 18.4 10*3/uL — ABNORMAL HIGH (ref 3.8–10.6)

## 2017-07-19 LAB — URINALYSIS, COMPLETE (UACMP) WITH MICROSCOPIC
Bacteria, UA: NONE SEEN
Bilirubin Urine: NEGATIVE
GLUCOSE, UA: NEGATIVE mg/dL
HGB URINE DIPSTICK: NEGATIVE
Ketones, ur: NEGATIVE mg/dL
Nitrite: NEGATIVE
PH: 5 (ref 5.0–8.0)
PROTEIN: NEGATIVE mg/dL
Specific Gravity, Urine: 1.016 (ref 1.005–1.030)
Squamous Epithelial / LPF: NONE SEEN

## 2017-07-19 LAB — COMPREHENSIVE METABOLIC PANEL WITH GFR
ALT: 11 U/L — ABNORMAL LOW (ref 17–63)
AST: 23 U/L (ref 15–41)
Albumin: 3.6 g/dL (ref 3.5–5.0)
Alkaline Phosphatase: 94 U/L (ref 38–126)
Anion gap: 11 (ref 5–15)
BUN: 15 mg/dL (ref 6–20)
CO2: 30 mmol/L (ref 22–32)
Calcium: 9.4 mg/dL (ref 8.9–10.3)
Chloride: 98 mmol/L — ABNORMAL LOW (ref 101–111)
Creatinine, Ser: 1.31 mg/dL — ABNORMAL HIGH (ref 0.61–1.24)
GFR calc Af Amer: >60 mL/min
GFR calc non Af Amer: 52 mL/min — ABNORMAL LOW
Glucose, Bld: 102 mg/dL — ABNORMAL HIGH (ref 65–99)
Potassium: 3.4 mmol/L — ABNORMAL LOW (ref 3.5–5.1)
Sodium: 139 mmol/L (ref 135–145)
Total Bilirubin: 1.7 mg/dL — ABNORMAL HIGH (ref 0.3–1.2)
Total Protein: 7 g/dL (ref 6.5–8.1)

## 2017-07-19 MED ORDER — MORPHINE SULFATE (PF) 4 MG/ML IV SOLN
4.0000 mg | Freq: Once | INTRAVENOUS | Status: AC
  Administered 2017-07-19: 4 mg via INTRAVENOUS
  Filled 2017-07-19: qty 1

## 2017-07-19 MED ORDER — BUSPIRONE HCL 5 MG PO TABS
7.5000 mg | ORAL_TABLET | Freq: Two times a day (BID) | ORAL | Status: DC
  Administered 2017-07-19 – 2017-07-24 (×10): 7.5 mg via ORAL
  Filled 2017-07-19: qty 1
  Filled 2017-07-19 (×3): qty 1.5
  Filled 2017-07-19: qty 1
  Filled 2017-07-19: qty 1.5
  Filled 2017-07-19: qty 2
  Filled 2017-07-19 (×3): qty 1.5

## 2017-07-19 MED ORDER — CEFTRIAXONE SODIUM 1 G IJ SOLR
1.0000 g | Freq: Once | INTRAMUSCULAR | Status: AC
  Administered 2017-07-19: 1 g via INTRAVENOUS
  Filled 2017-07-19: qty 10

## 2017-07-19 MED ORDER — ONDANSETRON HCL 4 MG/2ML IJ SOLN
4.0000 mg | Freq: Once | INTRAMUSCULAR | Status: AC
  Administered 2017-07-19: 4 mg via INTRAVENOUS
  Filled 2017-07-19: qty 2

## 2017-07-19 MED ORDER — TRAMADOL HCL 50 MG PO TABS
50.0000 mg | ORAL_TABLET | Freq: Two times a day (BID) | ORAL | Status: DC
  Administered 2017-07-19: 50 mg via ORAL
  Filled 2017-07-19: qty 1

## 2017-07-19 NOTE — H&P (Signed)
[facility] NAME: Ricardo Erickson    MR#:  [id number]  DATE OF BIRTH:  21-Aug-1941  DATE OF ADMISSION:  07/19/2017  PRIMARY CARE PHYSICIAN: Rusty Aus, MD   REQUESTING/REFERRING PHYSICIAN: Joni Fears, MD  CHIEF COMPLAINT:   Chief Complaint  Patient presents with  . Altered Mental Status  . Hallucinations    HISTORY OF PRESENT ILLNESS:  Ricardo Erickson  is a 76 y.o. male who presents with Altered mental status, weakness. Patient recently had kyphoplasty done after compression fracture. He was in rehabilitation for a few days afterwards, and then went home because he did not want to see the rehabilitation facility. However, his physical status has deteriorated some since that time. Tonight he was found to have a UTI in the ED, potentially explaining his weakness and confusion. Hospitalists were called for admission  PAST MEDICAL HISTORY:   Past Medical History:  Diagnosis Date  . Chronic back pain   . Depression   . Emphysema lung ([place])   . Hypertension   . Personal history of tobacco use, presenting hazards to health 08/06/2015  . Pollen allergies   . PTSD (post-traumatic stress disorder)     PAST SURGICAL HISTORY:   Past Surgical History:  Procedure Laterality Date  . BACK SURGERY    . KYPHOPLASTY N/A 06/27/2017   Procedure: KYPHOPLASTY -L5;  Surgeon: Hessie Knows, MD;  Location: [facility] ORS;  Service: Orthopedics;  Laterality: N/A;  . TONSILLECTOMY AND ADENOIDECTOMY  1980    SOCIAL HISTORY:   Social History  Substance Use Topics  . Smoking status: Current Every Day Smoker    Packs/day: 1.50    Years: 60.00  . Smokeless tobacco: Never Used  . Alcohol use No    FAMILY HISTORY:   Family History  Problem Relation Age of Onset  . Alcohol abuse Father   . Arthritis Mother   . Heart disease Maternal Grandmother   . Prostate cancer Brother     DRUG ALLERGIES:  No Known Allergies  MEDICATIONS AT HOME:    Prior to Admission medications   Medication Sig Start Date End Date Taking? Authorizing Provider  acetaminophen (TYLENOL) 650 MG CR tablet Take 650 mg by mouth every 8 (eight) hours as needed for pain.   Yes [provider]  albuterol (PROVENTIL HFA;VENTOLIN HFA) 108 (90 Base) MCG/ACT inhaler Inhale 2 puffs into the lungs as needed for wheezing or shortness of breath.   Yes [provider]  albuterol (PROVENTIL) (2.5 MG/3ML) 0.083% nebulizer solution Inhale 2.5 mg into the lungs every 6 (six) hours as needed.  01/15/15  Yes [provider]  amiodarone (PACERONE) 400 MG tablet Take 1 tablet (400 mg total) by mouth 2 (two) times daily. Patient taking differently: Take 200 mg by mouth daily.  06/29/17  Yes Dustin Flock, MD  aspirin EC 325 MG tablet Take 1 tablet (325 mg total) by mouth daily. Patient taking differently: Take 81 mg by mouth daily.  06/29/17 06/29/18 Yes Dustin Flock, MD  busPIRone (BUSPAR) 7.5 MG tablet Take 1 tablet twice daily for anxiety. 06/19/17  Yes Pleas Koch, NP  cyclobenzaprine (FLEXERIL) 5 MG tablet Take 5 mg by mouth 2 (two) times daily as needed for muscle spasms. 07/18/17  Yes [provider]  digoxin (LANOXIN) 0.125 MG tablet Take 1 tablet (0.125 mg total) by mouth daily. 06/30/17  Yes Dustin Flock, MD  diltiazem (CARDIZEM CD) 240 MG 24 hr capsule Take 1 capsule (  240 mg total) by mouth daily. 06/30/17  Yes Dustin Flock, MD  Fluticasone-Salmeterol (ADVAIR DISKUS) 250-50 MCG/DOSE AEPB Inhale 1 puff into the lungs every 12 (twelve) hours. Reported on 04/01/2016   Yes [provider]  furosemide (LASIX) 20 MG tablet Take 20 mg by mouth daily. 07/17/17 07/17/18 Yes [provider]  pramipexole (MIRAPEX) 1 MG tablet Take 1 mg by mouth at bedtime.   Yes [provider]  tamsulosin (FLOMAX) 0.4 MG CAPS capsule Take 0.4 mg by mouth daily.   Yes [provider]  tiotropium (SPIRIVA) 18 MCG inhalation  capsule Place 18 mcg into inhaler and inhale daily.   Yes [provider]  traMADol (ULTRAM) 50 MG tablet Take 50 mg by mouth 2 (two) times daily.   Yes [provider]  ALPRAZolam Duanne Moron) 1 MG tablet Take 1 tablet (1 mg total) by mouth 3 (three) times daily as needed for anxiety. Patient not taking: Reported on 07/19/2017 06/29/17   Dustin Flock, MD  docusate sodium (COLACE) 100 MG capsule Take 1 capsule (100 mg total) by mouth 2 (two) times daily. Patient not taking: Reported on 07/19/2017 06/29/17   Dustin Flock, MD  oxyCODONE-acetaminophen (PERCOCET/ROXICET) 5-325 MG tablet Take 1-2 tablets by mouth every 4 (four) hours as needed for severe pain. Patient not taking: Reported on 07/19/2017 06/29/17   Dustin Flock, MD  polyethylene glycol [facility] / Floria Raveling) packet Take 17 g by mouth daily as needed for mild constipation. Patient not taking: Reported on 07/19/2017 06/29/17   Dustin Flock, MD    REVIEW OF SYSTEMS:  Review of Systems  Constitutional: Negative for chills, fever, malaise/fatigue and weight loss.  HENT: Negative for ear pain, hearing loss and tinnitus.   Eyes: Negative for blurred vision, double vision, pain and redness.  Respiratory: Negative for cough, hemoptysis and shortness of breath.   Cardiovascular: Negative for chest pain, palpitations, orthopnea and leg swelling.  Gastrointestinal: Positive for abdominal pain. Negative for constipation, diarrhea, nausea and vomiting.  Genitourinary: Positive for dysuria. Negative for frequency and hematuria.  Musculoskeletal: Positive for joint pain (Left hip). Negative for back pain and neck pain.  Skin:       No acne, rash, or lesions  Neurological: Negative for dizziness, tremors, focal weakness and weakness.  Endo/Heme/Allergies: Negative for polydipsia. Does not bruise/bleed easily.  Psychiatric/Behavioral: Negative for depression. The patient is not nervous/anxious and does not have insomnia.      VITAL  SIGNS:   Vitals:   07/19/17 1732 07/19/17 2028 07/19/17 2152 07/19/17 2300  BP: 137/80 124/63 117/67 134/74  Pulse: 66 61 64 (!) 59  Resp: (!) 25 20 20  (!) 32  Temp: 98.4 F (36.9 C)     TempSrc: Oral     SpO2: 94% 93% 93% 93%  Weight: 98 kg (216 lb)     Height: 5\' 9"  (1.753 m)      Wt Readings from Last 3 Encounters:  07/19/17 98 kg (216 lb)  06/24/17 98.2 kg (216 lb 8.6 oz)  06/19/17 100.4 kg (221 lb 6.4 oz)    PHYSICAL EXAMINATION:  Physical Exam  Vitals reviewed. Constitutional: He is oriented to person, place, and time. He appears well-developed and well-nourished. No distress.  HENT:  Head: Normocephalic and atraumatic.  Mouth/Throat: Oropharynx is clear and moist.  Eyes: Pupils are equal, round, and reactive to light. Conjunctivae and EOM are normal. No scleral icterus.  Neck: Normal range of motion. Neck supple. No JVD present. No thyromegaly present.  Cardiovascular: Normal  rate, regular rhythm and intact distal pulses.  Exam reveals no gallop and no friction rub.   No murmur heard. Respiratory: Effort normal and breath sounds normal. No respiratory distress. He has no wheezes. He has no rales.  GI: Soft. Bowel sounds are normal. He exhibits no distension. There is tenderness (Suprapubic).  Musculoskeletal: Normal range of motion. He exhibits tenderness (Left lateral waistline and upper thigh). He exhibits no edema.  No arthritis, no gout  Lymphadenopathy:    He has no cervical adenopathy.  Neurological: He is alert and oriented to person, place, and time. No cranial nerve deficit.  No dysarthria, no aphasia  Skin: Skin is warm and dry. No rash noted. No erythema.  Psychiatric: He has a normal mood and affect. His behavior is normal. Judgment and thought content normal.    LABORATORY PANEL:   CBC  Recent Labs Lab 07/19/17 1734  WBC 18.4*  HGB 16.6  HCT 50.0  PLT 221    ------------------------------------------------------------------------------------------------------------------  Chemistries   Recent Labs Lab 07/19/17 1734  NA 139  K 3.4*  CL 98*  CO2 30  GLUCOSE 102*  BUN 15  CREATININE 1.31*  CALCIUM 9.4  AST 23  ALT 11*  ALKPHOS 94  BILITOT 1.7*   ------------------------------------------------------------------------------------------------------------------  Cardiac Enzymes No results for input(s): TROPONINI in the last 168 hours. ------------------------------------------------------------------------------------------------------------------  RADIOLOGY:  Dg Chest 2 View  Result Date: 07/19/2017 CLINICAL DATA:  Dyspnea EXAM: CHEST  2 VIEW COMPARISON:  06/25/2017 FINDINGS: Normal heart size. Lungs clear. No pneumothorax. No pleural effusion. IMPRESSION: No active cardiopulmonary disease. Electronically Signed   By: Marybelle Killings M.D.   On: 07/19/2017 18:39   Ct Head Wo Contrast  Result Date: 07/19/2017 CLINICAL DATA:  76 y/o M; hallucinations in intermittent altered mental status. EXAM: CT HEAD WITHOUT CONTRAST TECHNIQUE: Contiguous axial images were obtained from the base of the skull through the vertex without intravenous contrast. COMPARISON:  06/26/2017 CT head. FINDINGS: Brain: No evidence of acute infarction, hemorrhage, hydrocephalus, extra-axial collection or mass lesion/mass effect. Stable nonspecific foci of hypoattenuation in subcortical and periventricular white matter compatible with mild chronic microvascular ischemic changes for age. Stable mild brain parenchymal volume loss. Vascular: No hyperdense vessel identified. Mild calcific atherosclerosis of carotid siphons. Skull: Normal. Negative for fracture or focal lesion. Sinuses/Orbits: No acute finding. Other: None. IMPRESSION: 1. No acute intracranial abnormality identified. 2. Stable mild chronic microvascular ischemic changes and parenchymal volume loss of the brain for  age. Electronically Signed   By: Kristine Garbe M.D.   On: 07/19/2017 18:44    EKG:   Orders placed or performed during the hospital encounter of 07/19/17  . ED EKG  . ED EKG  . EKG 12-Lead  . EKG 12-Lead    IMPRESSION AND PLAN:  Principal Problem:   UTI (urinary tract infection) - IV antibiotics and place, urine culture sent. Patient has a history of BPH and this likely predisposed him to UTI Active Problems:   COPD (chronic obstructive pulmonary disease) ([place]) - continue home meds   Anxiety - home dose anxiolytics   A-fib ([facility]) - home meds   BPH (benign prostatic hyperplasia) - continue home medications  All the records are reviewed and case discussed with ED provider. Management plans discussed with the patient and/or family.  DVT PROPHYLAXIS: SubQ lovenox  GI PROPHYLAXIS: None  ADMISSION STATUS: Inpatient  CODE STATUS: Full Code Status History    Date Active Date Inactive Code Status Order ID Comments User Context  06/24/2017 10:35 PM 06/30/2017  3:12 AM Full Code [id number]  Fritzi Mandes, MD Inpatient      TOTAL TIME TAKING CARE OF THIS PATIENT: 45 minutes.   Ricardo Erickson 07/19/2017, 11:36 PM  [facility]  Office  [phone]  CC: Primary care physician; Rusty Aus, MD  Note:  This document was prepared using Dragon voice recognition software and may include unintentional dictation errors.

## 2017-07-19 NOTE — ED Notes (Signed)
Condom Catheter applied with Iris RN as second Therapist, sports.

## 2017-07-19 NOTE — ED Triage Notes (Signed)
Pt arrives to ER via [facility] from home for hallucinations and intermittent altered mental status. Family reports pt had recent back surgery and has been confused and left hip pain. Pt taking flexeril and tramadol. VSS with EMS, CBG in 120's. Son at bedside. EDP at bedside.

## 2017-07-19 NOTE — ED Notes (Signed)
Patient given ice water per request. Patient tolerated drink well.

## 2017-07-19 NOTE — ED Notes (Signed)
Unable to void at this time.

## 2017-07-19 NOTE — ED Provider Notes (Signed)
[facility] Emergency Department Provider Note  ____________________________________________  Time seen: Approximately 6:07 PM  I have reviewed the triage vital signs and the nursing notes.   HISTORY  Chief Complaint Altered Mental Status and Hallucinations  Level 5 Caveat: Portions of the History and Physical are unable to be obtained due to patient being a poor historian Some additional history obtained from son at bedside  HPI Ricardo Erickson is a 76 y.o. male with a history of chronic back pain who had a kyphoplasty 2 weeks ago after which she is been taking Flexeril and tramadol. Since then he has had intermittent episodes of confusion and why his son causes hallucinations. He describes the patient's speech is intermittently incoherent and he is having worsening generalized weakness. He notes that the patient initially was at rehabilitation at [facility] after surgery and was improving, but after discharge from there back to home with home physical therapy, the patient is been gradually worsening. No falls or head trauma. No fever or vomiting. Patient is on Lasix for peripheral edema. Patient states she is thirsty and wants water. Never had confusion or altered mental status before according to the son.  Complains of chronic low back pain, unchanged. Worse with movement.     Past Medical History:  Diagnosis Date  . Chronic back pain   . Depression   . Emphysema lung ([place])   . Hypertension   . Personal history of tobacco use, presenting hazards to health 08/06/2015  . Pollen allergies   . PTSD (post-traumatic stress disorder)      Patient Active Problem List   Diagnosis Date Noted  . PTSD (post-traumatic stress disorder) 06/28/2017  . Adjustment disorder with anxiety 06/28/2017  . Closed compression fracture of L5 lumbar vertebra ([place])   . Left low back pain   . Weakness   . A-fib ([place]) 06/24/2017  . Prediabetes 01/18/2017  . Prostate cancer  ([place]) 01/18/2017  . Anxiety 01/18/2017  . Preventative health care 04/15/2016  . Restless leg syndrome 04/03/2016  . COPD (chronic obstructive pulmonary disease) ([place]) 04/03/2016  . BPH (benign prostatic hyperplasia) 04/03/2016  . Personal history of tobacco use, presenting hazards to health 08/06/2015     Past Surgical History:  Procedure Laterality Date  . BACK SURGERY    . KYPHOPLASTY N/A 06/27/2017   Procedure: KYPHOPLASTY -L5;  Surgeon: Hessie Knows, MD;  Location: [facility] ORS;  Service: Orthopedics;  Laterality: N/A;  . [place]     Prior to Admission medications   Medication Sig Start Date End Date Taking? Authorizing Provider  acetaminophen (TYLENOL) 650 MG CR tablet Take 650 mg by mouth every 8 (eight) hours as needed for pain.   Yes [provider]  albuterol (PROVENTIL HFA;VENTOLIN HFA) 108 (90 Base) MCG/ACT inhaler Inhale 2 puffs into the lungs as needed for wheezing or shortness of breath.   Yes [provider]  albuterol (PROVENTIL) (2.5 MG/3ML) 0.083% nebulizer solution Inhale 2.5 mg into the lungs every 6 (six) hours as needed.  01/15/15  Yes [provider]  amiodarone (PACERONE) 400 MG tablet Take 1 tablet (400 mg total) by mouth 2 (two) times daily. Patient taking differently: Take 200 mg by mouth daily.  06/29/17  Yes Dustin Flock, MD  aspirin EC 325 MG tablet Take 1 tablet (325 mg total) by mouth daily. Patient taking differently: Take 81 mg by mouth daily.  06/29/17 06/29/18 Yes Dustin Flock, MD  busPIRone (BUSPAR) 7.5 MG tablet Take  1 tablet twice daily for anxiety. 06/19/17  Yes Pleas Koch, NP  cyclobenzaprine (FLEXERIL) 5 MG tablet Take 5 mg by mouth 2 (two) times daily as needed for muscle spasms. 07/18/17  Yes [provider]  digoxin (LANOXIN) 0.125 MG tablet Take 1 tablet (0.125 mg total) by mouth daily. 06/30/17  Yes Dustin Flock, MD  diltiazem (CARDIZEM CD) 240 MG 24 hr capsule Take 1  capsule (240 mg total) by mouth daily. 06/30/17  Yes Dustin Flock, MD  Fluticasone-Salmeterol (ADVAIR DISKUS) 250-50 MCG/DOSE AEPB Inhale 1 puff into the lungs every 12 (twelve) hours. Reported on 04/01/2016   Yes [provider]  furosemide (LASIX) 20 MG tablet Take 20 mg by mouth daily. 07/17/17 07/17/18 Yes [provider]  pramipexole (MIRAPEX) 1 MG tablet Take 1 mg by mouth at bedtime.   Yes [provider]  tamsulosin (FLOMAX) 0.4 MG CAPS capsule Take 0.4 mg by mouth daily.   Yes [provider]  tiotropium (SPIRIVA) 18 MCG inhalation capsule Place 18 mcg into inhaler and inhale daily.   Yes [provider]  traMADol (ULTRAM) 50 MG tablet Take 50 mg by mouth 2 (two) times daily.   Yes [provider]  ALPRAZolam Duanne Moron) 1 MG tablet Take 1 tablet (1 mg total) by mouth 3 (three) times daily as needed for anxiety. Patient not taking: Reported on 07/19/2017 06/29/17   Dustin Flock, MD  docusate sodium (COLACE) 100 MG capsule Take 1 capsule (100 mg total) by mouth 2 (two) times daily. Patient not taking: Reported on 07/19/2017 06/29/17   Dustin Flock, MD  oxyCODONE-acetaminophen (PERCOCET/ROXICET) 5-325 MG tablet Take 1-2 tablets by mouth every 4 (four) hours as needed for severe pain. Patient not taking: Reported on 07/19/2017 06/29/17   Dustin Flock, MD  polyethylene glycol [facility] / Floria Raveling) packet Take 17 g by mouth daily as needed for mild constipation. Patient not taking: Reported on 07/19/2017 06/29/17   Dustin Flock, MD     Allergies Patient has no known allergies.   Family History  Problem Relation Age of Onset  . Alcohol abuse Father   . Arthritis Mother   . Heart disease Maternal Grandmother   . Prostate cancer Brother     Social History Social History  Substance Use Topics  . Smoking status: Current Every Day Smoker    Packs/day: 1.50    Years: 60.00  . Smokeless tobacco: Never Used  . Alcohol use No    Review  of Systems  Constitutional:   No fever or chills.  ENT:   No sore throat. No rhinorrhea. Cardiovascular:   No chest pain or syncope. Respiratory:   No dyspnea or cough. Gastrointestinal:   Negative for abdominal pain, vomiting and diarrhea.  Musculoskeletal:   Chronic low back pain All other systems reviewed and are negative except as documented above in ROS and HPI.  ____________________________________________   PHYSICAL EXAM:  VITAL SIGNS: ED Triage Vitals  Enc Vitals Group     BP 07/19/17 1730 137/80     Pulse Rate 07/19/17 1730 64     Resp 07/19/17 1730 19     Temp 07/19/17 1732 98.4 F (36.9 C)     Temp Source 07/19/17 1732 Oral     SpO2 07/19/17 1730 96 %     Weight 07/19/17 1732 216 lb (98 kg)     Height 07/19/17 1732 5\' 9"  (1.753 m)     Head Circumference --      Peak Flow --  Pain Score 07/19/17 1731 9     Pain Loc --      Pain Edu? --      Excl. in [place]? --     Vital signs reviewed, nursing assessments reviewed.   Constitutional:   Alert and orientedTo self. Well appearing and in no distress. Eyes:   No scleral icterus.  EOMI. No nystagmus. No conjunctival pallor. PERRL. ENT   Head:   Normocephalic and atraumatic.   Nose:   No congestion/rhinnorhea.    Mouth/Throat:   Dry mucous membranes, no pharyngeal erythema. No peritonsillar mass.    Neck:   No meningismus. Full ROM Hematological/Lymphatic/Immunilogical:   No cervical lymphadenopathy. Cardiovascular:   RRR. Symmetric bilateral radial and DP pulses.  No murmurs.  Respiratory:   Normal respiratory effort without tachypnea/retractions. Diminished breath sounds at right base Gastrointestinal:   Soft and nontender. Non distended. There is no CVA tenderness.  No rebound, rigidity, or guarding. Genitourinary:   deferred Musculoskeletal:   Normal range of motion in all extremities. No joint effusions.  No lower extremity tenderness.  No edema. No midline spinal tenderness. No inflammatory  changes at kyphoplasty injection site in the paraspinous musculature about 4 cm right lateral to L3. Neurologic:   Normal breathing mostly incoherent speech, non-linear..  Motor grossly intact. No gross focal neurologic deficits are appreciated.  Skin:    Skin is warm, dry and intact. No rash noted.  No petechiae, purpura, or bullae.  ____________________________________________    LABS (pertinent positives/negatives) (all labs ordered are listed, but only abnormal results are displayed) Labs Reviewed  COMPREHENSIVE METABOLIC PANEL - Abnormal; Notable for the following:       Result Value   Potassium 3.4 (*)    Chloride 98 (*)    Glucose, Bld 102 (*)    Creatinine, Ser 1.31 (*)    ALT 11 (*)    Total Bilirubin 1.7 (*)    GFR calc non Af Amer 52 (*)    All other components within normal limits  CBC - Abnormal; Notable for the following:    WBC 18.4 (*)    All other components within normal limits  URINALYSIS, COMPLETE (UACMP) WITH MICROSCOPIC - Abnormal; Notable for the following:    Color, Urine YELLOW (*)    APPearance CLEAR (*)    Leukocytes, UA SMALL (*)    All other components within normal limits  URINE CULTURE  URINE CULTURE  TROPONIN I   ____________________________________________   EKG  Interpreted by me Sinus rhythm rate of 65, left axis, prolonged QTC at 560 ms. Normal QRS ST segments and T waves.  ____________________________________________    ELFYBOFBP  Dg Chest 2 View  Result Date: 07/19/2017 CLINICAL DATA:  Dyspnea EXAM: CHEST  2 VIEW COMPARISON:  06/25/2017 FINDINGS: Normal heart size. Lungs clear. No pneumothorax. No pleural effusion. IMPRESSION: No active cardiopulmonary disease. Electronically Signed   By: Marybelle Killings M.D.   On: 07/19/2017 18:39   Ct Head Wo Contrast  Result Date: 07/19/2017 CLINICAL DATA:  76 y/o M; hallucinations in intermittent altered mental status. EXAM: CT HEAD WITHOUT CONTRAST TECHNIQUE: Contiguous axial images were  obtained from the base of the skull through the vertex without intravenous contrast. COMPARISON:  06/26/2017 CT head. FINDINGS: Brain: No evidence of acute infarction, hemorrhage, hydrocephalus, extra-axial collection or mass lesion/mass effect. Stable nonspecific foci of hypoattenuation in subcortical and periventricular white matter compatible with mild chronic microvascular ischemic changes for age. Stable mild brain parenchymal volume loss. Vascular: No hyperdense  vessel identified. Mild calcific atherosclerosis of carotid siphons. Skull: Normal. Negative for fracture or focal lesion. Sinuses/Orbits: No acute finding. Other: None. IMPRESSION: 1. No acute intracranial abnormality identified. 2. Stable mild chronic microvascular ischemic changes and parenchymal volume loss of the brain for age. Electronically Signed   By: Kristine Garbe M.D.   On: 07/19/2017 18:44    ____________________________________________   PROCEDURES Procedures  ____________________________________________   INITIAL IMPRESSION / ASSESSMENT AND PLAN / ED COURSE  Pertinent labs & imaging results that were available during my care of the patient were reviewed by me and considered in my medical decision making (see chart for details).  Patient presents with altered mental status, possibly pneumonia versus UTI versus dehydration and metabolic derangement. We'll give IV fluids, check labs chest x-ray urinalysis. CT head. Reassess   ----------------------------------------- 10:18 PM on 07/19/2017 -----------------------------------------  Workup positive for urinary tract infection. Patient is not septic, but with severe generalized weakness and encephalopathy, or warrants hospitalization, ceftriaxone, until clinically improved due to his severely impaired ability to manage his ADLs at home.     ____________________________________________   FINAL CLINICAL IMPRESSION(S) / ED DIAGNOSES  Final diagnoses:   Cystitis  Encephalopathy  Generalized weakness      New Prescriptions   No medications on file     Portions of this note were generated with dragon dictation software. Dictation errors may occur despite best attempts at proofreading.    Carrie Mew, MD 07/19/17 2219

## 2017-07-20 LAB — CBC
HEMATOCRIT: 47 % (ref 40.0–52.0)
HEMOGLOBIN: 15.5 g/dL (ref 13.0–18.0)
MCH: 30.4 pg (ref 26.0–34.0)
MCHC: 33.1 g/dL (ref 32.0–36.0)
MCV: 91.8 fL (ref 80.0–100.0)
Platelets: 187 10*3/uL (ref 150–440)
RBC: 5.12 MIL/uL (ref 4.40–5.90)
RDW: 14.4 % (ref 11.5–14.5)
WBC: 15.6 10*3/uL — AB (ref 3.8–10.6)

## 2017-07-20 LAB — BASIC METABOLIC PANEL
ANION GAP: 8 (ref 5–15)
BUN: 15 mg/dL (ref 6–20)
CO2: 32 mmol/L (ref 22–32)
Calcium: 8.9 mg/dL (ref 8.9–10.3)
Chloride: 100 mmol/L — ABNORMAL LOW (ref 101–111)
Creatinine, Ser: 1.19 mg/dL (ref 0.61–1.24)
GFR, EST NON AFRICAN AMERICAN: 58 mL/min — AB (ref >60)
Glucose, Bld: 94 mg/dL (ref 65–99)
POTASSIUM: 3.5 mmol/L (ref 3.5–5.1)
SODIUM: 140 mmol/L (ref 135–145)

## 2017-07-20 LAB — TROPONIN I: Troponin I: 0.04 ng/mL (ref <0.03)

## 2017-07-20 MED ORDER — ASPIRIN EC 81 MG PO TBEC
81.0000 mg | DELAYED_RELEASE_TABLET | Freq: Every day | ORAL | Status: DC
  Administered 2017-07-20 – 2017-07-28 (×9): 81 mg via ORAL
  Filled 2017-07-20 (×9): qty 1

## 2017-07-20 MED ORDER — FUROSEMIDE 20 MG PO TABS
20.0000 mg | ORAL_TABLET | Freq: Every day | ORAL | Status: DC
  Administered 2017-07-20 – 2017-07-28 (×9): 20 mg via ORAL
  Filled 2017-07-20 (×9): qty 1

## 2017-07-20 MED ORDER — AMIODARONE HCL 200 MG PO TABS
200.0000 mg | ORAL_TABLET | Freq: Every day | ORAL | Status: DC
  Administered 2017-07-20 – 2017-07-28 (×7): 200 mg via ORAL
  Filled 2017-07-20 (×7): qty 1

## 2017-07-20 MED ORDER — ONDANSETRON HCL 4 MG PO TABS: 4.0000 mg | ORAL_TABLET | Freq: Four times a day (QID) | ORAL | Status: DC | PRN

## 2017-07-20 MED ORDER — MOMETASONE FURO-FORMOTEROL FUM 200-5 MCG/ACT IN AERO
2.0000 | INHALATION_SPRAY | Freq: Two times a day (BID) | RESPIRATORY_TRACT | Status: DC
  Administered 2017-07-20 – 2017-07-28 (×16): 2 via RESPIRATORY_TRACT
  Filled 2017-07-20: qty 8.8

## 2017-07-20 MED ORDER — ACETAMINOPHEN 650 MG RE SUPP: 650.0000 mg | Freq: Four times a day (QID) | RECTAL | Status: DC | PRN

## 2017-07-20 MED ORDER — MORPHINE SULFATE (PF) 2 MG/ML IV SOLN
2.0000 mg | INTRAVENOUS | Status: DC | PRN
  Administered 2017-07-20: 2 mg via INTRAVENOUS
  Filled 2017-07-20 (×2): qty 1

## 2017-07-20 MED ORDER — PRAMIPEXOLE DIHYDROCHLORIDE 0.25 MG PO TABS
1.0000 mg | ORAL_TABLET | Freq: Every day | ORAL | Status: DC
  Administered 2017-07-20 – 2017-07-27 (×7): 1 mg via ORAL
  Filled 2017-07-20: qty 4
  Filled 2017-07-20 (×3): qty 1
  Filled 2017-07-20 (×2): qty 4
  Filled 2017-07-20 (×2): qty 1

## 2017-07-20 MED ORDER — NICOTINE 21 MG/24HR TD PT24
21.0000 mg | MEDICATED_PATCH | Freq: Every day | TRANSDERMAL | Status: DC
  Filled 2017-07-20: qty 1

## 2017-07-20 MED ORDER — TIOTROPIUM BROMIDE MONOHYDRATE 18 MCG IN CAPS
18.0000 ug | ORAL_CAPSULE | Freq: Every day | RESPIRATORY_TRACT | Status: DC
  Administered 2017-07-20 – 2017-07-28 (×9): 18 ug via RESPIRATORY_TRACT
  Filled 2017-07-20 (×2): qty 5

## 2017-07-20 MED ORDER — NICOTINE 21 MG/24HR TD PT24
21.0000 mg | MEDICATED_PATCH | Freq: Every day | TRANSDERMAL | Status: DC
  Administered 2017-07-20 – 2017-07-28 (×9): 21 mg via TRANSDERMAL
  Filled 2017-07-20 (×8): qty 1

## 2017-07-20 MED ORDER — DILTIAZEM HCL ER COATED BEADS 120 MG PO CP24
240.0000 mg | ORAL_CAPSULE | Freq: Every day | ORAL | Status: DC
  Administered 2017-07-20 – 2017-07-28 (×8): 240 mg via ORAL
  Filled 2017-07-20: qty 2
  Filled 2017-07-20 (×4): qty 1
  Filled 2017-07-20: qty 2
  Filled 2017-07-20 (×2): qty 1
  Filled 2017-07-20: qty 2

## 2017-07-20 MED ORDER — DEXTROSE 5 % IV SOLN
2.0000 g | INTRAVENOUS | Status: DC
  Administered 2017-07-20 – 2017-07-21 (×2): 2 g via INTRAVENOUS
  Filled 2017-07-20 (×2): qty 2

## 2017-07-20 MED ORDER — TRAMADOL HCL 50 MG PO TABS
50.0000 mg | ORAL_TABLET | Freq: Four times a day (QID) | ORAL | Status: DC | PRN
  Administered 2017-07-20 – 2017-07-25 (×6): 50 mg via ORAL
  Filled 2017-07-20 (×9): qty 1

## 2017-07-20 MED ORDER — DIGOXIN 125 MCG PO TABS
0.1250 mg | ORAL_TABLET | Freq: Every day | ORAL | Status: DC
  Administered 2017-07-20 – 2017-07-28 (×9): 0.125 mg via ORAL
  Filled 2017-07-20 (×9): qty 1

## 2017-07-20 MED ORDER — TAMSULOSIN HCL 0.4 MG PO CAPS
0.4000 mg | ORAL_CAPSULE | Freq: Every day | ORAL | Status: DC
  Administered 2017-07-20 – 2017-07-28 (×9): 0.4 mg via ORAL
  Filled 2017-07-20 (×9): qty 1

## 2017-07-20 MED ORDER — ENSURE ENLIVE PO LIQD
237.0000 mL | Freq: Two times a day (BID) | ORAL | Status: DC
  Administered 2017-07-22 – 2017-07-25 (×7): 237 mL via ORAL

## 2017-07-20 MED ORDER — ENOXAPARIN SODIUM 40 MG/0.4ML ~~LOC~~ SOLN
40.0000 mg | SUBCUTANEOUS | Status: DC
  Administered 2017-07-20 – 2017-07-21 (×2): 40 mg via SUBCUTANEOUS
  Filled 2017-07-20 (×2): qty 0.4

## 2017-07-20 MED ORDER — ACETAMINOPHEN 325 MG PO TABS
650.0000 mg | ORAL_TABLET | Freq: Four times a day (QID) | ORAL | Status: DC | PRN
  Administered 2017-07-22 – 2017-07-26 (×4): 650 mg via ORAL
  Filled 2017-07-20 (×4): qty 2

## 2017-07-20 MED ORDER — ONDANSETRON HCL 4 MG/2ML IJ SOLN: 4.0000 mg | Freq: Four times a day (QID) | INTRAMUSCULAR | Status: DC | PRN

## 2017-07-20 NOTE — Evaluation (Signed)
Physical Therapy Evaluation Patient Details Name: Ricardo Erickson MRN: [id number] DOB: [birth year] Today's Date: 07/20/2017   History of Present Illness  Pt is a 76 y.o.malewho presents with Altered mental status, weakness. Patient recently had kyphoplasty done 06/27/17 after compression fracture. He was in rehabilitation for a few days afterwards, and then went home. However, his physical status has deteriorated some since that time. He was found to have a UTI in the ED, potentially explaining his weakness and confusion. Hospitalists were called for admission.  Assessment includes: UTI, COPD, anxiety, A-fib, and BPH with kyphoplasty 06/27/17.    Clinical Impression  Pt presents with deficits in strength, transfers, mobility, gait, balance, and activity tolerance.  Pt required Max A and max verbal and tactile cues with bed mobility with log roll technique.  Pt required Max A with transfers from elevated EOB with Min A to maintain stability once in standing.  Pt able to lift LLE from floor briefly one time but unable to lift RLE from floor.  Pt stood for around 30 sec max before needing to return to sitting.  Pt anxious and limited pain.  Of note, pt's SpO2 at baseline 88% on 3LO2/min.  Nursing called with nursing requesting pt to remain on 3LO2/min and to monitor SpO2 during session.  SpO2 occasionally dropped to 86-87% during session but would return to 88-89% with rest and cues for pursed-lip breathing.  At end of session with pt returned to supine, however, SpO2 dropped to 83-84% and did not increase with breathing cues.  Nursing called with update and entered room to monitor pt at end of session.  Pt will benefit from PT services in a SNF setting upon discharge to address above deficits for decreased caregiver assistance and return to PLOF.      Follow Up Recommendations SNF    Equipment Recommendations  None recommended by PT    Recommendations for Other Services       Precautions /  Restrictions Precautions Precautions: Fall;Back Precaution Booklet Issued: No Precaution Comments: Kyphoplasty 06/27/17 during previous admission Restrictions Weight Bearing Restrictions: No      Mobility  Bed Mobility Overal bed mobility: Needs Assistance Bed Mobility: Supine to Sit;Sit to Supine     Supine to sit: Max assist Sit to supine: Max assist   General bed mobility comments: Log roll technique education and training with pt with Max A for all tasks as well as max verbal and tactile cues for sequencing.   Transfers Overall transfer level: Needs assistance Equipment used: Rolling walker (2 wheeled) Transfers: Sit to/from Stand Sit to Stand: Max assist         General transfer comment: Max verbal and tactile cues for sequencing from elevated EOB  Ambulation/Gait             General Gait Details: Pt able to lift L foot from the floor one time and unable to lift R foot from floor.  Pt then returned to sitting and unable to stand again.  Stairs            Wheelchair Mobility    Modified Rankin (Stroke Patients Only)       Balance Overall balance assessment: Needs assistance Sitting-balance support: Bilateral upper extremity supported;Feet supported Sitting balance-Leahy Scale: Fair     Standing balance support: Bilateral upper extremity supported Standing balance-Leahy Scale: Poor Standing balance comment: Heavy use of BUEs on RW with flexed trunk posture in standing with Min A for stability  Pertinent Vitals/Pain Pain Assessment: 0-10 Pain Score: 6  Pain Location: B hips and low back Pain Descriptors / Indicators: Aching Pain Intervention(s): Premedicated before session;Monitored during session;Limited activity within patient's tolerance    Home Living Family/patient expects to be discharged to:: Private residence (History obtained from pt and son) Living Arrangements: Alone Available Help at  Discharge: Family;Available PRN/intermittently Type of Home: House Home Access: Stairs to enter Entrance Stairs-Rails: Right Entrance Stairs-Number of Steps: 2 Home Layout: One level Home Equipment: Cane - single point;Walker - 2 wheels      Prior Function Level of Independence: Independent with assistive device(s)         Comments: Pt Mod I with amb with SPC prior to kyphoplasty 06/27/17; since that time pt has used a RW for limited amb household distances, no fall history     Hand Dominance   Dominant Hand: Right    Extremity/Trunk Assessment   Upper Extremity Assessment Upper Extremity Assessment: Generalized weakness    Lower Extremity Assessment Lower Extremity Assessment: Generalized weakness    Cervical / Trunk Assessment Cervical / Trunk Exceptions: cervical rotation and lateral flex to R with difficulty assisting pt to achieve neutral then pt returning to previous position   Communication   Communication: HOH  Cognition Arousal/Alertness: Lethargic Behavior During Therapy: Anxious Overall Cognitive Status: Impaired/Different from baseline (Per son UTI has resulted in decreased cognitive status relative to baseline)                                 General Comments: Increased time for pt to follow commands, anxious and limited by pain      General Comments      Exercises Total Joint Exercises Ankle Circles/Pumps: Strengthening;Both;5 reps;10 reps Quad Sets: Strengthening;Both;10 reps Heel Slides: AAROM;Both;10 reps Hip ABduction/ADduction: AAROM;Both;10 reps Straight Leg Raises: AAROM;Both;10 reps Long Arc Quad: AROM;Both;5 reps Other Exercises Other Exercises: Max verbal cues for pursed lip breathing with poor compliance   Assessment/Plan    PT Assessment Patient needs continued PT services  PT Problem List Decreased strength;Decreased activity tolerance;Decreased balance;Decreased knowledge of use of DME;Decreased mobility        PT Treatment Interventions DME instruction;Gait training;Stair training;Functional mobility training;Neuromuscular re-education;Balance training;Therapeutic activities;Therapeutic exercise;Patient/family education    PT Goals (Current goals can be found in the Care Plan section)  Acute Rehab PT Goals Patient Stated Goal: To walk better PT Goal Formulation: With patient/family Time For Goal Achievement: 08/02/17 Potential to Achieve Goals: Good    Frequency Min 2X/week   Barriers to discharge Decreased caregiver support;Inaccessible home environment      Co-evaluation               AM-PAC PT "6 Clicks" Daily Activity  Outcome Measure Difficulty turning over in bed (including adjusting bedclothes, sheets and blankets)?: Total Difficulty moving from lying on back to sitting on the side of the bed? : Total Difficulty sitting down on and standing up from a chair with arms (e.g., wheelchair, bedside commode, etc,.)?: Total Help needed moving to and from a bed to chair (including a wheelchair)?: A Lot Help needed walking in hospital room?: Total Help needed climbing 3-5 steps with a railing? : Total 6 Click Score: 7    End of Session Equipment Utilized During Treatment: Gait belt;Oxygen Activity Tolerance: Patient limited by fatigue;Patient limited by pain Patient left: in bed;with bed alarm set;with family/visitor present;with call bell/phone within reach Nurse Communication: Mobility status;Other (  comment) (SpO2 levels during session) PT Visit Diagnosis: Difficulty in walking, not elsewhere classified (R26.2);Muscle weakness (generalized) (M62.81)    Time: 3295-1884 PT Time Calculation (min) (ACUTE ONLY): 36 min   Charges:   PT Evaluation $PT Eval Low Complexity: 1 Low PT Treatments $Therapeutic Exercise: 8-22 mins   PT G Codes:        DRoyetta Asal PT, DPT 07/20/17, 11:36 AM

## 2017-07-20 NOTE — Progress Notes (Signed)
Pharmacy Antibiotic Note  Ricardo Erickson is a 76 y.o. male admitted on 07/19/2017 with UTI.  Pharmacy has been consulted for ceftriaxone dosing.  Plan: Ceftriaxone 2 grams q 24 hours ordered.  Height: 5\' 9"  (175.3 cm) Weight: 216 lb (98 kg) IBW/kg (Calculated) : 70.7  Temp (24hrs), Avg:98.4 F (36.9 C), Min:98.4 F (36.9 C), Max:98.4 F (36.9 C)   Recent Labs Lab 07/19/17 1734  WBC 18.4*  CREATININE 1.31*    Estimated Creatinine Clearance: 57.1 mL/min (A) (by C-G formula based on SCr of 1.31 mg/dL (H)).    No Known Allergies  Antimicrobials this admission: Ceftriaxone 8/8  >>    >>   Dose adjustments this admission:   Microbiology results: 8/8 UCx: pending  7/14 MRSA PCR: (-)      8/8 UA: LE(+) NO2(-)  WBC TNTC  Thank you for allowing pharmacy to be a part of this patient's care.  Anajulia Leyendecker S 07/20/2017 12:59 AM

## 2017-07-20 NOTE — Progress Notes (Signed)
Initial Nutrition Assessment  DOCUMENTATION CODES:   Obesity unspecified  INTERVENTION:  1. Ensure Enlive po BID, each supplement provides 350 kcal and 20 grams of protein 2. Patient does not have bottom dentures at this time. Monitor PO intake and adjust as needed.  NUTRITION DIAGNOSIS:   Inadequate oral intake related to poor appetite, chronic illness as evidenced by per patient/family report, percent weight loss.  GOAL:   Patient will meet greater than or equal to 90% of their needs  MONITOR:   PO intake, I & O's, Labs, Weight trends, Supplement acceptance  REASON FOR ASSESSMENT:   Malnutrition Screening Tool    ASSESSMENT:   Ricardo Erickson has a PMHHx of emphysema, HTN, PTSD, chronic back pain s/p kyphoplasty 06/2017 after compression fracture. Presents with UTI, AMS, weakness. Patient was at rehab facility but left early.  Patient reports being 225 pounds before losing weight but is unsure of a timespan or amount. Exhibits a 16#/7.2% per chart He reports normally eating [place] for breakfast, pimento cheese sandwich or some other sandwich for lunch, and his dinner meals vary based on what is cooked. Unsure if he is meeting his needs.States he has not been feeling well for many months and it can be hard to eat, but his appetite varies from day to day. Ate boiled eggs this morning for breakfast. Seemed a little confused during visit, had a hard time giving diet recall. Medications reviewed Labs reviewed:  TBili 1.7 Meal Completion: 100% Nutrition-Focused physical exam completed. Findings are moderate-severe fat depletion at orbitals, arms; moderate muscle depletion at temples, and moderate edema.    Diet Order:  Diet regular Room service appropriate? Yes; Fluid consistency: Thin  Skin:  Wound (see comment) (Abrasion to R Arm, Incision to Back)  Last BM:  07/17/2017  Height:   Ht Readings from Last 1 Encounters:  07/20/17 5\' 9"  (1.753 m)    Weight:   Wt  Readings from Last 1 Encounters:  07/20/17 205 lb 11.2 oz (93.3 kg)    Ideal Body Weight:  72.72 kg  BMI:  Body mass index is 30.38 kg/m.  Estimated Nutritional Needs:   Kcal:  1835-2000 calories (MSJ x1.1-1.2)  Protein:  111-130 grams (1.2-1.4g/kg)  Fluid:  1.8-2.0L  EDUCATION NEEDS:   No education needs identified at this time  Satira Anis. Hennesy Sobalvarro, MS, RD LDN Inpatient Clinical Dietitian Pager [phone]

## 2017-07-20 NOTE — Progress Notes (Signed)
Ricardo Erickson at [place] NAME: Ricardo Erickson    MR#:  [id number]  DATE OF BIRTH:  1941-10-05  SUBJECTIVE:  CHIEF COMPLAINT:   Chief Complaint  Patient presents with  . Altered Mental Status  . Hallucinations  , Seems comfortable, has some pain and requesting pain medication REVIEW OF SYSTEMS:  Review of Systems  Constitutional: Positive for malaise/fatigue. Negative for chills, fever and weight loss.  HENT: Negative for nosebleeds and sore throat.   Eyes: Negative for blurred vision.  Respiratory: Negative for cough, shortness of breath and wheezing.   Cardiovascular: Negative for chest pain, orthopnea, leg swelling and PND.  Gastrointestinal: Negative for abdominal pain, constipation, diarrhea, heartburn, nausea and vomiting.  Genitourinary: Negative for dysuria and urgency.  Musculoskeletal: Positive for falls and joint pain. Negative for back pain.  Skin: Negative for rash.  Neurological: Positive for weakness. Negative for dizziness, speech change, focal weakness and headaches.  Endo/Heme/Allergies: Does not bruise/bleed easily.  Psychiatric/Behavioral: Positive for hallucinations. Negative for depression.    DRUG ALLERGIES:  No Known Allergies VITALS:  Blood pressure 135/85, pulse 68, temperature 98 F (36.7 C), resp. rate (!) 26, height 5\' 9"  (1.753 m), weight 93.3 kg (205 lb 11.2 oz), SpO2 95 %. PHYSICAL EXAMINATION:  Physical Exam  Constitutional: He is oriented to person, place, and time and well-developed, well-nourished, and in no distress.  HENT:  Head: Normocephalic and atraumatic.  Eyes: Pupils are equal, round, and reactive to light. Conjunctivae and EOM are normal.  Neck: Normal range of motion. Neck supple. No tracheal deviation present. No thyromegaly present.  Cardiovascular: Normal rate, regular rhythm and normal heart sounds.   Pulmonary/Chest: Effort normal and breath sounds normal. No respiratory distress. He  has no wheezes. He exhibits no tenderness.  Abdominal: Soft. Bowel sounds are normal. He exhibits no distension. There is no tenderness.  Musculoskeletal: Normal range of motion.  Neurological: He is alert and oriented to person, place, and time. No cranial nerve deficit.  Skin: Skin is warm and dry. No rash noted.  Psychiatric: Mood and affect normal.   LABORATORY PANEL:  Male CBC  Recent Labs Lab 07/20/17 0357  WBC 15.6*  HGB 15.5  HCT 47.0  PLT 187   ------------------------------------------------------------------------------------------------------------------ Chemistries   Recent Labs Lab 07/19/17 1734 07/20/17 0357  NA 139 140  K 3.4* 3.5  CL 98* 100*  CO2 30 32  GLUCOSE 102* 94  BUN 15 15  CREATININE 1.31* 1.19  CALCIUM 9.4 8.9  AST 23  --   ALT 11*  --   ALKPHOS 94  --   BILITOT 1.7*  --    RADIOLOGY:  Dg Chest 2 View  Result Date: 07/19/2017 CLINICAL DATA:  Dyspnea EXAM: CHEST  2 VIEW COMPARISON:  06/25/2017 FINDINGS: Normal heart size. Lungs clear. No pneumothorax. No pleural effusion. IMPRESSION: No active cardiopulmonary disease. Electronically Signed   By: Marybelle Killings M.D.   On: 07/19/2017 18:39   Ct Head Wo Contrast  Result Date: 07/19/2017 CLINICAL DATA:  76 y/o M; hallucinations in intermittent altered mental status. EXAM: CT HEAD WITHOUT CONTRAST TECHNIQUE: Contiguous axial images were obtained from the base of the skull through the vertex without intravenous contrast. COMPARISON:  06/26/2017 CT head. FINDINGS: Brain: No evidence of acute infarction, hemorrhage, hydrocephalus, extra-axial collection or mass lesion/mass effect. Stable nonspecific foci of hypoattenuation in subcortical and periventricular white matter compatible with mild chronic microvascular ischemic changes for age. Stable mild brain parenchymal volume  loss. Vascular: No hyperdense vessel identified. Mild calcific atherosclerosis of carotid siphons. Skull: Normal. Negative for fracture  or focal lesion. Sinuses/Orbits: No acute finding. Other: None. IMPRESSION: 1. No acute intracranial abnormality identified. 2. Stable mild chronic microvascular ischemic changes and parenchymal volume loss of the brain for age. Electronically Signed   By: Kristine Garbe M.D.   On: 07/19/2017 18:44   ASSESSMENT AND PLAN:   UTI (urinary tract infection)  - Based on UA, continue IV Rocephin, await urine culture  * COPD (chronic obstructive pulmonary disease) ([place]) - continue home meds   * Anxiety - continue BuSpar   * A-fib ([place]) - continue amiodarone, digoxin, Cardizem   BPH (benign prostatic hyperplasia) - continue home medications   Physical therapy evaluation - recommends short-term rehabilitation, social worker consult placed  All the records are reviewed and case discussed with Care Management/Social Worker. Management plans discussed with the patient, family (son at bedside) and they are in agreement.  CODE STATUS: Full Code  TOTAL TIME TAKING CARE OF THIS PATIENT: 35 minutes.   More than 50% of the time was spent in counseling/coordination of care: YES  POSSIBLE D/C IN 1-2 DAYS, DEPENDING ON CLINICAL CONDITION.   Max Sane M.D on 07/20/2017 at 2:48 PM  Between 7am to 6pm - Pager - [phone]  After 6pm go to [email or web address] - Proofreader  [facility]  Office  [phone]  CC: Primary care physician; Rusty Aus, MD  Note: This dictation was prepared with Dragon dictation along with smaller phrase technology. Any transcriptional errors that result from this process are unintentional.

## 2017-07-20 NOTE — NC FL2 (Signed)
[place] LEVEL OF CARE SCREENING TOOL     IDENTIFICATION  Patient Name: Ricardo Erickson Birthdate: 04-Sep-1941 Sex: male Admission Date (Current Location): 07/19/2017  [place] and [place] Number:  Engineering geologist and Address:  [facility], [address], [place], [place] [address]      Provider Number: [id number]  Attending Physician Name and Address:  Max Sane, MD  Relative Name and Phone Number:       Current Level of Care: Hospital Recommended Level of Care: [place] Prior Approval Number:    Date Approved/Denied:   PASRR Number: [id number] E, expires 07/29/2017  Discharge Plan: SNF    Current Diagnoses: Patient Active Problem List   Diagnosis Date Noted  . UTI (urinary tract infection) 07/19/2017  . PTSD (post-traumatic stress disorder) 06/28/2017  . Adjustment disorder with anxiety 06/28/2017  . Closed compression fracture of L5 lumbar vertebra ([place])   . Left low back pain   . Weakness   . A-fib ([place]) 06/24/2017  . Prediabetes 01/18/2017  . Prostate cancer ([place]) 01/18/2017  . Anxiety 01/18/2017  . Preventative health care 04/15/2016  . Restless leg syndrome 04/03/2016  . COPD (chronic obstructive pulmonary disease) ([place]) 04/03/2016  . BPH (benign prostatic hyperplasia) 04/03/2016  . Personal history of tobacco use, presenting hazards to health 08/06/2015    Orientation RESPIRATION BLADDER Height & Weight     Self, Place  O2 (6L) Incontinent Weight: 205 lb 11.2 oz (93.3 kg) Height:  5\' 9"  (175.3 cm)  BEHAVIORAL SYMPTOMS/MOOD NEUROLOGICAL BOWEL NUTRITION STATUS      Continent Diet  AMBULATORY STATUS COMMUNICATION OF NEEDS Skin   Limited Assist Verbally Normal                       Personal Care Assistance Level of Assistance  Bathing, Feeding, Dressing Bathing Assistance: Limited assistance Feeding assistance: Limited assistance Dressing Assistance: Limited assistance     Functional  Limitations Info  Sight, Hearing, Speech Sight Info: Adequate Hearing Info: Adequate Speech Info: Adequate    SPECIAL CARE FACTORS FREQUENCY  PT (By licensed PT), OT (By licensed OT)     PT Frequency: 5 OT Frequency: 5            Contractures Contractures Info: Not present    Additional Factors Info  Code Status, Allergies, Psychotropic Code Status Info: Full Code Allergies Info: No know allergies Psychotropic Info: Medications:  Buspar         Current Medications (07/20/2017):  This is the current hospital active medication list Current Facility-Administered Medications  Medication Dose Route Frequency Provider Last Rate Last Dose  . acetaminophen (TYLENOL) tablet 650 mg  650 mg Oral Q6H PRN Lance Coon, MD       Or  . acetaminophen (TYLENOL) suppository 650 mg  650 mg Rectal Q6H PRN Lance Coon, MD      . amiodarone (PACERONE) tablet 200 mg  200 mg Oral Daily Lance Coon, MD   200 mg at 07/20/17 1000  . aspirin EC tablet 81 mg  81 mg Oral Daily Lance Coon, MD   81 mg at 07/20/17 1000  . busPIRone (BUSPAR) tablet 7.5 mg  7.5 mg Oral BID Carrie Mew, MD   7.5 mg at 07/20/17 6948  . cefTRIAXone (ROCEPHIN) 2 g in dextrose 5 % 50 mL IVPB  2 g Intravenous Q24H Lance Coon, MD   Stopped at 07/20/17 1030  . digoxin (LANOXIN) tablet 0.125 mg  0.125  mg Oral Daily Lance Coon, MD   0.125 mg at 07/20/17 1610  . diltiazem (CARDIZEM CD) 24 hr capsule 240 mg  240 mg Oral Daily Lance Coon, MD   240 mg at 07/20/17 1000  . enoxaparin (LOVENOX) injection 40 mg  40 mg Subcutaneous Q24H Lance Coon, MD      . feeding supplement (ENSURE ENLIVE) (ENSURE ENLIVE) liquid 237 mL  237 mL Oral BID BM Manuella Ghazi, Vipul, MD      . furosemide (LASIX) tablet 20 mg  20 mg Oral Daily Lance Coon, MD   20 mg at 07/20/17 1000  . mometasone-formoterol (DULERA) 200-5 MCG/ACT inhaler 2 puff  2 puff Inhalation BID Lance Coon, MD   2 puff at 07/20/17 0800  . morphine 2 MG/ML injection 2 mg   2 mg Intravenous Q4H PRN Lance Coon, MD   2 mg at 07/20/17 0505  . nicotine (NICODERM CQ - dosed in mg/24 hours) patch 21 mg  21 mg Transdermal Daily Manuella Ghazi, Vipul, MD      . ondansetron (ZOFRAN) tablet 4 mg  4 mg Oral Q6H PRN Lance Coon, MD       Or  . ondansetron [facility]) injection 4 mg  4 mg Intravenous Q6H PRN Lance Coon, MD      . pramipexole (MIRAPEX) tablet 1 mg  1 mg Oral QHS Lance Coon, MD      . tamsulosin [facility]) capsule 0.4 mg  0.4 mg Oral Daily Lance Coon, MD   0.4 mg at 07/20/17 9604  . tiotropium (SPIRIVA) inhalation capsule 18 mcg  18 mcg Inhalation Daily Lance Coon, MD   18 mcg at 07/20/17 0800  . traMADol (ULTRAM) tablet 50 mg  50 mg Oral Q6H PRN Max Sane, MD   50 mg at 07/20/17 1259     Discharge Medications: Please see discharge summary for a list of discharge medications.  Relevant Imaging Results:  Relevant Lab Results:   Additional Information SSN:  [id number]  Darden Dates, LCSW

## 2017-07-20 NOTE — Evaluation (Signed)
Occupational Therapy Evaluation Patient Details Name: Ricardo Erickson MRN: [id number] DOB: November 17, 1941 Today's Date: 07/20/2017    History of Present Illness Pt is a 76 y.o.malewho presents with Altered mental status, weakness. Patient recently had kyphoplasty done 06/27/17 after compression fracture. He was in rehabilitation for a few days afterwards, and then went home. However, his physical status has deteriorated some since that time. He was found to have a UTI in the ED, potentially explaining his weakness and confusion. Hospitalists were called for admission.  Assessment includes: UTI, COPD, anxiety, A-fib, and BPH with kyphoplasty 06/27/17.   Clinical Impression   Pt seen for OT evaluation this date. Pt required assist for ADL/IADL prior to admission since recent kyphoplasty and using RW for ambulation. Pt reports not using any AD prior to the kyphoplasty. Pt presents with significant lower back/hip pain with movement and mild pain in L foot. Pitting edema noted in pt's legs distal to the knees, bilaterally. Pt limited with all functional mobility this session due to pain as primary limiting factor. Pt somewhat confused, able to follow commands and answer questions appropriate with additional time. Disoriented to the date. Pt's daughter present for end of session, able to confirm PLOF. Pt had a PCA come to the home 2-3x/week to assist with ADL and IADL tasks with family helping PRN (work during the day). Daughter also notes there is someone who stays with the pt overnight. Pt notes that "I can't take care of myself. I get lost in my own house." Pt on 5L O2 throughout session, O2 sats 91-92% at rest without improvement with pursed lip breathing. Pt reports mildly SOB all the time. Pt educated in PLB, unable to demonstrate good technique without max cueing. Pt will benefit from skilled OT services to address impairments in strength, activity tolerance, cardiopulmonary status, and cognition in order to  maximize return to PLOF. Recommend STR prior to safe return home.    Follow Up Recommendations  SNF    Equipment Recommendations  3 in 1 bedside commode    Recommendations for Other Services       Precautions / Restrictions Precautions Precautions: Fall;Back Precaution Booklet Issued: No Precaution Comments: Kyphoplasty 06/27/17 during previous admission Restrictions Weight Bearing Restrictions: No      Mobility Bed Mobility               General bed mobility comments: unable to attempt due to pain  Transfers                 General transfer comment: unable to attempt due to pain    Balance                                           ADL either performed or assessed with clinical judgement   ADL Overall ADL's : Needs assistance/impaired Eating/Feeding: Set up;Bed level   Grooming: Bed level;Set up   Upper Body Bathing: Sitting;Set up;Min guard   Lower Body Bathing: Moderate assistance;Bed level;Maximal assistance   Upper Body Dressing : Min guard;Sitting;Set up   Lower Body Dressing: Bed level;Maximal assistance;Moderate assistance     Toilet Transfer Details (indicate cue type and reason): unable to attempt due to pain           General ADL Comments: pt max assist for LB ADL tasks     Vision Baseline Vision/History: Wears glasses Wears Glasses: Reading only  Patient Visual Report: No change from baseline Vision Assessment?: No apparent visual deficits     Perception     Praxis      Pertinent Vitals/Pain Pain Assessment: 0-10 Pain Score: 3  Pain Location: B hips and low back and L foot Pain Descriptors / Indicators: Aching Pain Intervention(s): Limited activity within patient's tolerance;Monitored during session;Repositioned     Hand Dominance Right   Extremity/Trunk Assessment Upper Extremity Assessment Upper Extremity Assessment: Generalized weakness   Lower Extremity Assessment Lower Extremity Assessment:  Generalized weakness (L foot pain, BLE pitting edema noted)       Communication Communication Communication: HOH   Cognition Arousal/Alertness: Lethargic Behavior During Therapy: WFL for tasks assessed/performed Overall Cognitive Status: Impaired/Different from baseline Area of Impairment: Orientation                 Orientation Level: Disoriented to;Time             General Comments: slow to follow commands, slightly slow processing of questions and responding, but does respond appropriately   General Comments       Exercises Other Exercises Other Exercises: pt educated in pursed lip breathing, requires VC to perform, continues with poor compliance   Shoulder Instructions      Home Living Family/patient expects to be discharged to:: Private residence Living Arrangements: Alone Available Help at Discharge: Family;Available PRN/intermittently;Personal care attendant (family in/out throughout the week, pt generally at home alone during the day. PCA comes to home a couple times/wk and someone stays with pt overnight) Type of Home: House Home Access: Stairs to enter [employer] of Steps: 2 Entrance Stairs-Rails: Right Home Layout: One level     Bathroom Shower/Tub: Tub/shower unit;Walk-in shower   Bathroom Toilet: Standard     Home Equipment: Cane - single point;Walker - 2 wheels;Tub bench;Shower seat          Prior Functioning/Environment Level of Independence: Needs assistance  Gait / Transfers Assistance Needed: indep with ambulation using AD, using RW since kyphoplasty ADL's / Homemaking Assistance Needed: family/PCA assists with cooking/cleaning, med mgt, bathing; pt reports sleeping in recliner for years   Comments: no falls in 12 months        OT Problem List: Pain;Decreased strength;Cardiopulmonary status limiting activity;Decreased cognition;Decreased activity tolerance;Decreased safety awareness;Decreased knowledge of use of DME or  AE;Increased edema      OT Treatment/Interventions: Self-care/ADL training;Therapeutic exercise;Therapeutic activities;Energy conservation;Patient/family education;DME and/or AE instruction    OT Goals(Current goals can be found in the care plan section) Acute Rehab OT Goals Patient Stated Goal: To walk better and have less pain OT Goal Formulation: With patient/family Time For Goal Achievement: 08/03/17 Potential to Achieve Goals: Good  OT Frequency: Min 1X/week   Barriers to D/C: Inaccessible home environment;Decreased caregiver support          Co-evaluation              AM-PAC PT "6 Clicks" Daily Activity     Outcome Measure Help from another person eating meals?: None Help from another person taking care of personal grooming?: None Help from another person toileting, which includes using toliet, bedpan, or urinal?: A Lot Help from another person bathing (including washing, rinsing, drying)?: A Lot Help from another person to put on and taking off regular upper body clothing?: A Little Help from another person to put on and taking off regular lower body clothing?: A Lot 6 Click Score: 17   End of Session    Activity Tolerance: Patient limited  by pain Patient left: in bed;with call bell/phone within reach;with bed alarm set;with family/visitor present  OT Visit Diagnosis: Other abnormalities of gait and mobility (R26.89);Muscle weakness (generalized) (M62.81);Pain Pain - Right/Left: Left Pain - part of body: Ankle and joints of foot                Time: 1453-1518 OT Time Calculation (min): 25 min Charges:  OT General Charges $OT Visit: 1 Procedure OT Evaluation $OT Eval Low Complexity: 1 Procedure OT Treatments $Self Care/Home Management : 8-22 mins G-Codes: OT G-codes **NOT FOR INPATIENT CLASS** Functional Assessment Tool Used: AM-PAC 6 Clicks Daily Activity;Clinical judgement Functional Limitation: Self care Self Care Current Status (Y5486): At least 40  percent but less than 60 percent impaired, limited or restricted Self Care Goal Status (O8241): At least 20 percent but less than 40 percent impaired, limited or restricted   Jeni Salles, MPH, MS, OTR/L ascom [phone] 07/20/17, 3:43 PM

## 2017-07-20 NOTE — ED Notes (Signed)
Lab called to report a critical high troponin of 0.04 Dr. Jannifer Franklin was notified.

## 2017-07-20 NOTE — Progress Notes (Signed)
OT Cancellation Note  Patient Details Name: Ricardo Erickson MRN: [id number] DOB: 10-07-1941   Cancelled Treatment:    Reason Eval/Treat Not Completed: Medical issues which prohibited therapy. Order received, chart reviewed. Pt recently worked with PT, drop in O2 noted to 80% on 3LNC, RN increased to [facility] and pt maintaining O2 sats in 90's, very lethargic. Respiratory therapist called. Will hold OT evaluation at this time and continue to monitor closely for appropriateness for OT evaluation at later date/time.   Jeni Salles, MPH, MS, OTR/L ascom [phone] 07/20/17, 11:27 AM

## 2017-07-20 NOTE — Progress Notes (Signed)
Patient requesting nicotine patch.  Dr. Manuella Ghazi paged.

## 2017-07-20 NOTE — Progress Notes (Signed)
Patient resting comfortably.  VSS, NSR on monitor, 5LNC.  Patient having some intermittent confusion.  Family planning to visit later this afternoon.

## 2017-07-20 NOTE — Progress Notes (Signed)
While working with PT, patient's oxygen saturation began to drop on 3L.  I was called into the room and patients o2 was around 80% on 3LNC, also very lethargic and couldn't keep his eyes open.  I put 02 saturation up to 5L, called respiratory therapist, and slowly patient began to recover. Patient maintaining 02 levels in 90's on 5LNC.    Continue to monitor closely.

## 2017-07-21 ENCOUNTER — Inpatient Hospital Stay: Payer: Medicare Other

## 2017-07-21 LAB — CBC
HEMATOCRIT: 43.6 % (ref 40.0–52.0)
Hemoglobin: 14.4 g/dL (ref 13.0–18.0)
MCH: 30.2 pg (ref 26.0–34.0)
MCHC: 33.1 g/dL (ref 32.0–36.0)
MCV: 91.5 fL (ref 80.0–100.0)
PLATELETS: 157 10*3/uL (ref 150–440)
RBC: 4.77 MIL/uL (ref 4.40–5.90)
RDW: 14.8 % — AB (ref 11.5–14.5)
WBC: 15.3 10*3/uL — AB (ref 3.8–10.6)

## 2017-07-21 LAB — MAGNESIUM: Magnesium: 1.9 mg/dL (ref 1.7–2.4)

## 2017-07-21 LAB — URINE CULTURE

## 2017-07-21 LAB — BASIC METABOLIC PANEL
Anion gap: 10 (ref 5–15)
BUN: 13 mg/dL (ref 6–20)
CALCIUM: 8.4 mg/dL — AB (ref 8.9–10.3)
CO2: 31 mmol/L (ref 22–32)
CREATININE: 1.15 mg/dL (ref 0.61–1.24)
Chloride: 99 mmol/L — ABNORMAL LOW (ref 101–111)
GFR calc Af Amer: >60 mL/min (ref >60)
GLUCOSE: 93 mg/dL (ref 65–99)
Potassium: 3 mmol/L — ABNORMAL LOW (ref 3.5–5.1)
Sodium: 140 mmol/L (ref 135–145)

## 2017-07-21 MED ORDER — SENNOSIDES-DOCUSATE SODIUM 8.6-50 MG PO TABS
2.0000 | ORAL_TABLET | Freq: Two times a day (BID) | ORAL | Status: DC
  Administered 2017-07-21 – 2017-07-28 (×15): 2 via ORAL
  Filled 2017-07-21 (×15): qty 2

## 2017-07-21 MED ORDER — DEXTROSE 5 % IV SOLN
2.0000 g | INTRAVENOUS | Status: DC
  Filled 2017-07-21: qty 2

## 2017-07-21 MED ORDER — POTASSIUM CHLORIDE CRYS ER 20 MEQ PO TBCR
40.0000 meq | EXTENDED_RELEASE_TABLET | Freq: Once | ORAL | Status: AC
  Administered 2017-07-21: 40 meq via ORAL
  Filled 2017-07-21: qty 2

## 2017-07-21 MED ORDER — LIDOCAINE 5 % EX PTCH
1.0000 | MEDICATED_PATCH | CUTANEOUS | Status: DC
  Administered 2017-07-21 – 2017-07-28 (×8): 1 via TRANSDERMAL
  Filled 2017-07-21 (×10): qty 1

## 2017-07-21 MED ORDER — AZITHROMYCIN 250 MG PO TABS
250.0000 mg | ORAL_TABLET | Freq: Every day | ORAL | Status: DC
  Administered 2017-07-22 – 2017-07-23 (×2): 250 mg via ORAL
  Filled 2017-07-21 (×2): qty 1

## 2017-07-21 MED ORDER — AZITHROMYCIN 500 MG PO TABS
500.0000 mg | ORAL_TABLET | Freq: Every day | ORAL | Status: AC
  Administered 2017-07-21: 500 mg via ORAL
  Filled 2017-07-21: qty 1

## 2017-07-21 NOTE — Progress Notes (Signed)
Palliative Medicine Team  Due to high volume of referrals, there is a delay seeing this patient. PMT not at [facility] over the weekend but will arrange goals of care with patient and family on Monday. If discharged this weekend, may benefit from palliative services to follow outpatient. Thank you for the opportunity to participate in the care of Ricardo Erickson.    NO CHARGE  Ihor Dow, FNP-C Palliative Medicine Team  Phone: [phone] Fax: [phone]

## 2017-07-21 NOTE — Care Management Important Message (Signed)
Important Message  Patient Details  Name: Ricardo Erickson MRN: [id number] Date of Birth: 08/01/1941   Medicare Important Message Given:  Yes    Jolly [place]ango, RN 07/21/2017, 1:49 PM

## 2017-07-21 NOTE — Progress Notes (Signed)
[place] [facility] [place] NAME: Ricardo Erickson    MR#:  [id number]  DATE OF BIRTH:  1940/12/30  SUBJECTIVE:  CHIEF COMPLAINT:   Chief Complaint  Patient presents with  . Altered Mental Status  . Hallucinations  Coughing, seems to be fairly short of breath, barely able to finish sentences, taking shallow breath, constipated  REVIEW OF SYSTEMS:  Review of Systems  Constitutional: Positive for malaise/fatigue. Negative for chills, fever and weight loss.  HENT: Negative for nosebleeds and sore throat.   Eyes: Negative for blurred vision.  Respiratory: Positive for cough and shortness of breath. Negative for wheezing.   Cardiovascular: Negative for chest pain, orthopnea, leg swelling and PND.  Gastrointestinal: Positive for constipation. Negative for abdominal pain, diarrhea, heartburn, nausea and vomiting.  Genitourinary: Negative for dysuria and urgency.  Musculoskeletal: Positive for falls and joint pain. Negative for back pain.  Skin: Negative for rash.  Neurological: Positive for weakness. Negative for dizziness, speech change, focal weakness and headaches.  Endo/Heme/Allergies: Does not bruise/bleed easily.  Psychiatric/Behavioral: Positive for hallucinations. Negative for depression.   DRUG ALLERGIES:  No Known Allergies VITALS:  Blood pressure 131/60, pulse 64, temperature 98.5 F (36.9 C), temperature source Oral, resp. rate 19, height 5\' 9"  (1.753 m), weight 93.3 kg (205 lb 11.2 oz), SpO2 92 %. PHYSICAL EXAMINATION:  Physical Exam  Constitutional: He is oriented to person, place, and time and well-developed, well-nourished, and in no distress.  HENT:  Head: Normocephalic and atraumatic.  Eyes: Pupils are equal, round, and reactive to light. Conjunctivae and EOM are normal.  Neck: Normal range of motion. Neck supple. No tracheal deviation present. No thyromegaly present.  Cardiovascular: Normal rate, regular rhythm and normal heart  sounds.   Pulmonary/Chest: Accessory muscle usage present. He is in respiratory distress. He has decreased breath sounds in the right lower field and the left lower field. He has no wheezes. He exhibits no tenderness.  Abdominal: Soft. Bowel sounds are normal. He exhibits no distension. There is no tenderness.  Musculoskeletal: Normal range of motion.  Neurological: He is alert and oriented to person, place, and time. No cranial nerve deficit.  Skin: Skin is warm and dry. No rash noted.  Psychiatric: Mood and affect normal.   LABORATORY PANEL:  Male CBC  Recent Labs Lab 07/21/17 0503  WBC 15.3*  HGB 14.4  HCT 43.6  PLT 157   ------------------------------------------------------------------------------------------------------------------ Chemistries   Recent Labs Lab 07/19/17 1734  07/21/17 0503  NA 139  < > 140  K 3.4*  < > 3.0*  CL 98*  < > 99*  CO2 30  < > 31  GLUCOSE 102*  < > 93  BUN 15  < > 13  CREATININE 1.31*  < > 1.15  CALCIUM 9.4  < > 8.4*  MG  --   --  1.9  AST 23  --   --   ALT 11*  --   --   ALKPHOS 94  --   --   BILITOT 1.7*  --   --   < > = values in this interval not displayed. RADIOLOGY:  Dg Chest Port 1 View  Result Date: 07/21/2017 CLINICAL DATA:  Shortness of breath, emphysema, hypertension EXAM: PORTABLE CHEST 1 VIEW COMPARISON:  Portable exam 1102 hours compared to 07/19/2017 FINDINGS: Upper normal size of cardiac silhouette. Mediastinal contours and pulmonary vascularity normal. Atherosclerotic calcification aorta. Bronchitic changes with new bibasilar opacities, portions representing mild bibasilar atelectasis  though superimposed infiltrates are not excluded. Upper lungs clear. No pneumothorax or pleural effusion. Diffuse osseous demineralization. IMPRESSION: Bronchitic changes with new bibasilar opacities question combination of atelectasis and potential infiltrates. Aortic Atherosclerosis (ICD10-I70.0). Electronically Signed   By: Lavonia Dana M.D.    On: 07/21/2017 11:25   ASSESSMENT AND PLAN:   * UTI (urinary tract infection)  - Based on UA, continue IV Rocephin, urine culture growing mixed organisms - will order new urine c/s  * Possible pneumonia - As patient's breathing had been worse.  Chest x-ray was done which shows possible bilateral infiltrate - We will add Zithromax.  He is already on Rocephin for UTI  * Hypokalemia - Replete and recheck  * COPD (chronic obstructive pulmonary disease) ([place]) - continue home meds  * Constipation - Add stool softener - He reports last bowel movement on Tuesday   * Anxiety - continue BuSpar   * A-fib ([place]) - continue amiodarone, digoxin, Cardizem    * BPH (benign prostatic hyperplasia) - continue home medications     Physical therapy evaluation - recommends short-term rehabilitation, social worker working on placement  All the records are reviewed and case discussed with [employer] engineer. Management plans discussed with the patient, family (son at bedside) and they are in agreement.  CODE STATUS: Full Code  TOTAL TIME TAKING CARE OF THIS PATIENT: 35 minutes.   More than 50% of the time was spent in counseling/coordination of care: YES  POSSIBLE D/C early next week, DEPENDING ON CLINICAL CONDITION.   Max Sane M.D on 07/21/2017 at 1:00 PM  Between 7am to 6pm - Pager - [phone]  After 6pm go to [email or web address] - Proofreader  [facility]  Office  [phone]  CC: Primary care physician; Rusty Aus, MD  Note: This dictation was prepared with Dragon dictation along with smaller phrase technology. Any transcriptional errors that result from this process are unintentional.

## 2017-07-21 NOTE — Progress Notes (Addendum)
Family Meeting Note  Advance Directive:yes  Today a meeting took place with the Patient.  The following clinical team members were present during this meeting:MD  The following were discussed:Patient's diagnosis: , Patient's progosis: > 12 months and Goals for treatment: DNR  Additional follow-up to be provided: DNR, Palliative care eval  Time spent during discussion:20 minutes  Max Sane, MD

## 2017-07-21 NOTE — Clinical Social Work Placement (Signed)
   CLINICAL SOCIAL WORK PLACEMENT  NOTE  Date:  07/21/2017  Patient Details  Name: Ricardo Erickson MRN: [id number] Date of Birth: 1941-08-06  Clinical Social Work is seeking post-discharge placement for this patient at the [place] level of care (*CSW will initial, date and re-position this form in  chart as items are completed):  Yes   Patient/family provided with [facility] Work Department's list of facilities offering this level of care within the geographic area requested by the patient (or if unable, by the patient's family).  Yes   Patient/family informed of their freedom to choose among providers that offer the needed level of care, that participate in Medicare, Medicaid or managed care program needed by the patient, have an available bed and are willing to accept the patient.  Yes   Patient/family informed of [facility]'s ownership interest in [facility] and [facility], as well as of the fact that they are under no obligation to receive care at these facilities.  PASRR submitted to EDS on       PASRR number received on       Existing PASRR number confirmed on 07/20/17     FL2 transmitted to all facilities in geographic area requested by pt/family on 07/20/17     FL2 transmitted to all facilities within larger geographic area on       Patient informed that his/her managed care company has contracts with or will negotiate with certain facilities, including the following:        Yes   Patient/family informed of bed offers received.  Patient chooses bed at  [facility] )     Physician recommends and patient chooses bed at      Patient to be transferred to   on  .  Patient to be transferred to facility by       Patient family notified on   of transfer.  Name of family member notified:        PHYSICIAN       Additional Comment:    _______________________________________________ Lleyton Byers, Veronia Beets, LCSW 07/21/2017, 1:06  PM

## 2017-07-21 NOTE — Progress Notes (Signed)
Occupational Therapy Treatment Patient Details Name: Ricardo Erickson MRN: [id number] DOB: Sep 19, 1941 Today's Date: 07/21/2017    History of present illness Pt is a 76 y.o.malewho was admitted to [facility] with UTI, altered mental status, and weakness. Patient had a  recent kyphoplasty done 06/27/17 after compression fracture. He was in rehabilitation for a few days afterwards, and then went home. Pt. PMHx includes: UTI, COPD, anxiety, A-fib, and BPH with kyphoplasty 06/27/17.   OT comments  Pt. was very fatigued today. Pt. continues to have 5LO2 with SO2@ at 91%, and HR: 52 bpms. Pt. Reports he is not having a good day. Pt. required maxA for repositioning to midline with pillows, as pt. leans to the right. Pt. education was provided about A/E use for LE ADLs. Pt. education was provided about pursed lip breathing, and requires verbal cues and visual demonstration for proper technique. Pt. education was provided about energy conservation techniques. Pt. was provided with a visual handout. Pt. would benefit from SNF level of care upon discharge with follow-up OT services.   Follow Up Recommendations  SNF    Equipment Recommendations       Recommendations for Other Services      Precautions / Restrictions Precautions Precautions: Fall;Back Precaution Booklet Issued: No Precaution Comments: Kyphoplasty 06/27/17 during previous admission Restrictions Weight Bearing Restrictions: No                                                     ADL either performed or assessed with clinical judgement   ADL Overall ADL's : Needs assistance/impaired Eating/Feeding: Set up;Bed level   Grooming: Set up;Bed level   Upper Body Bathing: Moderate assistance   Lower Body Bathing: Maximal assistance   Upper Body Dressing : Moderate assistance   Lower Body Dressing: Maximal assistance               Functional mobility during ADLs: Maximal assistance;+2 for physical  assistance General ADL Comments: Pt. education was provided about A/E use for LE ADLs, energy conservation, and work simplification techniques.     Vision Baseline Vision/History: Wears glasses Wears Glasses: Reading only Patient Visual Report: No change from baseline     Perception     Praxis      Cognition Arousal/Alertness: Lethargic Behavior During Therapy: WFL for tasks assessed/performed Overall Cognitive Status: Impaired/Different from baseline                                          Exercises     Shoulder Instructions       General Comments      Pertinent Vitals/ Pain       Pain Assessment: 0-10 Pain Score: 8  Pain Location: torso pain Pain Descriptors / Indicators: Aching Pain Intervention(s): Limited activity within patient's tolerance;Monitored during session;Repositioned  Home Living                                          Prior Functioning/Environment              Frequency  Min 1X/week        Progress Toward Goals  OT Goals(current  goals can now be found in the care plan section)  Progress towards OT goals: Progressing toward goals  Acute Rehab OT Goals Patient Stated Goal: To regain independence. OT Goal Formulation: With patient/family Potential to Achieve Goals: Good  Plan Discharge plan remains appropriate    Co-evaluation                 AM-PAC PT "6 Clicks" Daily Activity     Outcome Measure   Help from another person eating meals?: None Help from another person taking care of personal grooming?: A Little Help from another person toileting, which includes using toliet, bedpan, or urinal?: A Lot Help from another person bathing (including washing, rinsing, drying)?: A Lot Help from another person to put on and taking off regular upper body clothing?: A Little Help from another person to put on and taking off regular lower body clothing?: A Lot 6 Click Score: 16    End of  Session    OT Visit Diagnosis: Other abnormalities of gait and mobility (R26.89);Muscle weakness (generalized) (M62.81);Pain   Activity Tolerance Patient limited by pain   Patient Left in bed;with call bell/phone within reach;with bed alarm set;with family/visitor present   Nurse Communication      Functional Assessment Tool Used: AM-PAC 6 Clicks Daily Activity;Clinical judgement Functional Limitation: Self care Self Care Current Status (F6213): At least 60 percent but less than 80 percent impaired, limited or restricted Self Care Goal Status (Y8657): At least 20 percent but less than 40 percent impaired, limited or restricted   Time: 8469-6295 OT Time Calculation (min): 20 min  Charges: OT G-codes **NOT FOR INPATIENT CLASS** Functional Assessment Tool Used: AM-PAC 6 Clicks Daily Activity;Clinical judgement Functional Limitation: Self care Self Care Current Status (M8413): At least 60 percent but less than 80 percent impaired, limited or restricted Self Care Goal Status (K4401): At least 20 percent but less than 40 percent impaired, limited or restricted OT General Charges $OT Visit: 1 Procedure OT Treatments $Self Care/Home Management : 8-22 mins  Harrel Carina, MS, OTR/L    Harrel Carina, MS, OTR/L 07/21/2017, 3:31 PM

## 2017-07-21 NOTE — Clinical Social Work Note (Signed)
Clinical Social Work Assessment  Patient Details  Name: Ricardo Erickson MRN: [id number] Date of Birth: 09-22-1941  Date of referral:  07/21/17               Reason for consult:  Facility Placement                Permission sought to share information with:  Chartered certified accountant granted to share information::  Yes, Verbal Permission Granted  Name::      [place]::   [place]   Relationship::     Contact Information:     Housing/Transportation Living arrangements for the past 2 months:  [place] of Information:  Patient, Adult Children Patient Interpreter Needed:  None Criminal Activity/Legal Involvement Pertinent to Current Situation/Hospitalization:  No - Comment as needed Significant Relationships:  Adult Children Lives with:  Self Do you feel safe going back to the place where you live?  Yes Need for family participation in patient care:  Yes (Comment)  Care giving concerns:  Patient lives alone in [place].    Social Worker assessment / plan:  Holiday representative (CSW) received SNF consult. PT is recommending SNF. CSW met with patient alone at bedside to discuss D/C plan. Patient was laying in the bed and was alert and oriented X3. CSW introduced self and explained role of CSW department. Patient reported that he lives alone in [place] and he is agreeable to going to SNF. Patient requested [facility] and stated that he has been there recently. CSW explained that [facility] will have to approve SNF stay. Patient verbalized his understanding. FL2 complete and faxed out. [facility] Medicare SNF authorization was started.   Per MD patient will not be stable for D/C over the weekend. CSW presented bed offers and patient chose [facility]. [facility] admissions coordinator at [facility] is aware of accepted bed offer. [facility] Medicare authorization has been received from [facility] Kaufmann #  [phone], RVB, 547 therapy minutes per week). CSW made patient aware that a new Blue Medicare authorization will have to be started on Monday because auth is only good for 48 hours. CSW contacted patient's son [name] and made him aware of above. CSW will continue to follow and assist as needed.   Employment status:  Retired Nurse, adult PT Recommendations:  [place] / Referral to community resources:  [place]  Patient/Family's Response to care:  Patient is agreeable to going to [facility].   Patient/Family's Understanding of and Emotional Response to Diagnosis, Current Treatment, and Prognosis:  Patient and his son were very pleasant and thanked CSW for assistance.   Emotional Assessment Appearance:  Appears stated age Attitude/Demeanor/Rapport:    Affect (typically observed):  Accepting, Adaptable, Pleasant Orientation:  Oriented to Self, Oriented to Place, Oriented to  Time, Oriented to Situation Alcohol / Substance use:  Not Applicable Psych involvement (Current and /or in the community):  No (Comment)  Discharge Needs  Concerns to be addressed:  Discharge Planning Concerns Readmission within the last 30 days:  No Current discharge risk:  Dependent with Mobility Barriers to Discharge:  Continued Medical Work up   [employer], Veronia Beets, LCSW 07/21/2017, 1:07 PM

## 2017-07-22 ENCOUNTER — Inpatient Hospital Stay: Payer: Medicare Other

## 2017-07-22 ENCOUNTER — Encounter: Payer: Self-pay | Admitting: Radiology

## 2017-07-22 LAB — CBC
HEMATOCRIT: 43.4 % (ref 40.0–52.0)
Hemoglobin: 14.4 g/dL (ref 13.0–18.0)
MCH: 30 pg (ref 26.0–34.0)
MCHC: 33.3 g/dL (ref 32.0–36.0)
MCV: 90.1 fL (ref 80.0–100.0)
PLATELETS: 150 10*3/uL (ref 150–440)
RBC: 4.81 MIL/uL (ref 4.40–5.90)
RDW: 14.3 % (ref 11.5–14.5)
WBC: 14.7 10*3/uL — ABNORMAL HIGH (ref 3.8–10.6)

## 2017-07-22 LAB — BASIC METABOLIC PANEL
Anion gap: 8 (ref 5–15)
BUN: 10 mg/dL (ref 6–20)
CO2: 30 mmol/L (ref 22–32)
Calcium: 8.6 mg/dL — ABNORMAL LOW (ref 8.9–10.3)
Chloride: 103 mmol/L (ref 101–111)
Creatinine, Ser: 0.99 mg/dL (ref 0.61–1.24)
GFR calc Af Amer: >60 mL/min (ref >60)
GFR calc non Af Amer: >60 mL/min (ref >60)
GLUCOSE: 106 mg/dL — AB (ref 65–99)
Potassium: 3.9 mmol/L (ref 3.5–5.1)
Sodium: 141 mmol/L (ref 135–145)

## 2017-07-22 LAB — HEPARIN LEVEL (UNFRACTIONATED): HEPARIN UNFRACTIONATED: 0.28 [IU]/mL — AB (ref 0.30–0.70)

## 2017-07-22 LAB — PROTIME-INR
INR: 1.13
Prothrombin Time: 14.6 seconds (ref 11.4–15.2)

## 2017-07-22 LAB — APTT: aPTT: 34 seconds (ref 24–36)

## 2017-07-22 MED ORDER — HEPARIN (PORCINE) IN NACL 100-0.45 UNIT/ML-% IJ SOLN
16.0000 [IU]/kg/h | INTRAMUSCULAR | Status: DC
  Administered 2017-07-22: 16 [IU]/kg/h via INTRAVENOUS
  Filled 2017-07-22: qty 250

## 2017-07-22 MED ORDER — PIPERACILLIN-TAZOBACTAM 3.375 G IVPB
3.3750 g | Freq: Three times a day (TID) | INTRAVENOUS | Status: DC
  Administered 2017-07-22 – 2017-07-24 (×6): 3.375 g via INTRAVENOUS
  Filled 2017-07-22 (×6): qty 50

## 2017-07-22 MED ORDER — ALPRAZOLAM 1 MG PO TABS
1.0000 mg | ORAL_TABLET | Freq: Three times a day (TID) | ORAL | Status: DC | PRN
  Administered 2017-07-23: 1 mg via ORAL
  Filled 2017-07-22: qty 1

## 2017-07-22 MED ORDER — HEPARIN BOLUS VIA INFUSION
1300.0000 [IU] | Freq: Once | INTRAVENOUS | Status: AC
  Administered 2017-07-22: 1300 [IU] via INTRAVENOUS
  Filled 2017-07-22: qty 1300

## 2017-07-22 MED ORDER — HEPARIN BOLUS VIA INFUSION
4000.0000 [IU] | Freq: Once | INTRAVENOUS | Status: AC
  Administered 2017-07-22: 4000 [IU] via INTRAVENOUS
  Filled 2017-07-22: qty 4000

## 2017-07-22 MED ORDER — HEPARIN (PORCINE) IN NACL 100-0.45 UNIT/ML-% IJ SOLN
1800.0000 [IU]/h | INTRAMUSCULAR | Status: DC
  Administered 2017-07-22 – 2017-07-23 (×2): 1650 [IU]/h via INTRAVENOUS
  Administered 2017-07-23: 1800 [IU]/h via INTRAVENOUS
  Filled 2017-07-22 (×2): qty 250

## 2017-07-22 MED ORDER — IOPAMIDOL (ISOVUE-370) INJECTION 76%
75.0000 mL | Freq: Once | INTRAVENOUS | Status: AC | PRN
  Administered 2017-07-22: 75 mL via INTRAVENOUS

## 2017-07-22 MED ORDER — ALBUTEROL SULFATE (2.5 MG/3ML) 0.083% IN NEBU
2.5000 mg | INHALATION_SOLUTION | Freq: Four times a day (QID) | RESPIRATORY_TRACT | Status: DC | PRN
  Administered 2017-07-25: 2.5 mg via RESPIRATORY_TRACT
  Filled 2017-07-22 (×2): qty 3

## 2017-07-22 NOTE — Progress Notes (Signed)
Pt alert and oriented for the most part. Stating that he is seeing "ghosts" in the corner. Anxious at times. Still remaining on 5L of oxygen at this time. Still with some Dyspnea on exertion. Pt taught pursed lip breathing and encouraged to use incentive spirometer. No complaints of back pain this shift.

## 2017-07-22 NOTE — Progress Notes (Signed)
ANTICOAGULATION CONSULT NOTE - Initial Consult  Pharmacy Consult for heparin gtt Indication: pulmonary embolus  No Known Allergies  Patient Measurements: Height: 5\' 9"  (175.3 cm) Weight: 205 lb 11.2 oz (93.3 kg) IBW/kg (Calculated) : 70.7 Heparin Dosing Weight: 89.9  Vital Signs: BP: 136/70 (08/11 0842) Pulse Rate: 61 (08/11 0842)  Labs:  Recent Labs  07/19/17 1802 07/20/17 0357 07/21/17 0503 07/22/17 0358  HGB  --  15.5 14.4 14.4  HCT  --  47.0 43.6 43.4  PLT  --  187 157 150  CREATININE  --  1.19 1.15 0.99  TROPONINI 0.04*  --   --   --     Estimated Creatinine Clearance: 73.8 mL/min (by C-G formula based on SCr of 0.99 mg/dL).   Medical History: Past Medical History:  Diagnosis Date  . Chronic back pain   . Depression   . Emphysema lung ([place])   . Hypertension   . Personal history of tobacco use, presenting hazards to health 08/06/2015  . Pollen allergies   . PTSD (post-traumatic stress disorder)     Medications:  Scheduled:  . amiodarone  200 mg Oral Daily  . aspirin EC  81 mg Oral Daily  . azithromycin  250 mg Oral Daily  . busPIRone  7.5 mg Oral BID  . digoxin  0.125 mg Oral Daily  . diltiazem  240 mg Oral Daily  . feeding supplement (ENSURE ENLIVE)  237 mL Oral BID BM  . furosemide  20 mg Oral Daily  . heparin  4,000 Units Intravenous Once  . lidocaine  1 patch Transdermal Q24H  . mometasone-formoterol  2 puff Inhalation BID  . nicotine  21 mg Transdermal Daily  . pramipexole  1 mg Oral QHS  . senna-docusate  2 tablet Oral BID  . tamsulosin  0.4 mg Oral Daily  . tiotropium  18 mcg Inhalation Daily   Infusions:  . heparin    . piperacillin-tazobactam (ZOSYN)  IV     PRN: acetaminophen **OR** acetaminophen, ondansetron **OR** ondansetron (ZOFRAN) IV, traMADol  Assessment: 76 year old male with PE to be anticoagulated on heparin gtt per pharmacy protocol.   Goal of Therapy:  Heparin level 0.3-0.7 units/ml Monitor platelets by  anticoagulation protocol: Yes   Plan:  Give 4000 units bolus x 1 Start heparin infusion at 1450 units/hr Check anti-Xa level in 6 hours and daily while on heparin Continue to monitor H&H and platelets  Donna Christen Taiylor Virden 07/22/2017,2:35 PM

## 2017-07-22 NOTE — Progress Notes (Signed)
Pt expressed anxiety and concern over current diagnosis. Family at bedside, pt tearful, states he is afraid he is going to die. Provided emotional support to pt and family, offered explanation of current plan of care and information about PNA and PE. Pt expressed gratitude for all members of care team, more calm at this time. Pt and family would like to further discuss these with MD at rounds tomorrow.

## 2017-07-22 NOTE — Progress Notes (Signed)
OT Cancellation Note  Patient Details Name: Ricardo Erickson MRN: [id number] DOB: Apr 11, 1941   Cancelled Treatment:    Reason Eval/Treat Not Completed: Other (comment). Pt recently started eating lunch upon attempt to treat and reports going down for CT scan momentarily. Will hold OT treatment at this time and re-attempt at later date/time as pt is available.  Jeni Salles, MPH, MS, OTR/L ascom [phone] 07/22/17, 1:10 PM

## 2017-07-22 NOTE — Progress Notes (Signed)
ANTICOAGULATION CONSULT NOTE - Initial Consult  Pharmacy Consult for heparin gtt Indication: pulmonary embolus  No Known Allergies  Patient Measurements: Height: 5\' 9"  (175.3 cm) Weight: 205 lb 11.2 oz (93.3 kg) IBW/kg (Calculated) : 70.7 Heparin Dosing Weight: 89.9  Vital Signs: Temp: 98.8 F (37.1 C) (08/11 2122) Temp Source: Oral (08/11 2122) BP: 144/67 (08/11 2122) Pulse Rate: 60 (08/11 2122)  Labs:  Recent Labs  07/20/17 0357 07/21/17 0503 07/22/17 0358 07/22/17 1441 07/22/17 2207  HGB 15.5 14.4 14.4  --   --   HCT 47.0 43.6 43.4  --   --   PLT 187 157 150  --   --   APTT  --   --   --  34  --   LABPROT  --   --   --  14.6  --   INR  --   --   --  1.13  --   HEPARINUNFRC  --   --   --   --  0.28*  CREATININE 1.19 1.15 0.99  --   --     Estimated Creatinine Clearance: 73.8 mL/min (by C-G formula based on SCr of 0.99 mg/dL).   Medical History: Past Medical History:  Diagnosis Date  . Chronic back pain   . Depression   . Emphysema lung ([place])   . Hypertension   . Personal history of tobacco use, presenting hazards to health 08/06/2015  . Pollen allergies   . PTSD (post-traumatic stress disorder)     Medications:  Scheduled:  . amiodarone  200 mg Oral Daily  . aspirin EC  81 mg Oral Daily  . azithromycin  250 mg Oral Daily  . busPIRone  7.5 mg Oral BID  . digoxin  0.125 mg Oral Daily  . diltiazem  240 mg Oral Daily  . feeding supplement (ENSURE ENLIVE)  237 mL Oral BID BM  . furosemide  20 mg Oral Daily  . heparin  1,300 Units Intravenous Once  . lidocaine  1 patch Transdermal Q24H  . mometasone-formoterol  2 puff Inhalation BID  . nicotine  21 mg Transdermal Daily  . pramipexole  1 mg Oral QHS  . senna-docusate  2 tablet Oral BID  . tamsulosin  0.4 mg Oral Daily  . tiotropium  18 mcg Inhalation Daily   Infusions:  . heparin    . piperacillin-tazobactam (ZOSYN)  IV 3.375 g (07/22/17 2126)   PRN: acetaminophen **OR** acetaminophen, albuterol,  ALPRAZolam, ondansetron **OR** ondansetron (ZOFRAN) IV, traMADol  Assessment: 76 year old male with PE to be anticoagulated on heparin gtt per pharmacy protocol.   Goal of Therapy:  Heparin level 0.3-0.7 units/ml Monitor platelets by anticoagulation protocol: Yes   Plan:  Give 4000 units bolus x 1 Start heparin infusion at 1450 units/hr Check anti-Xa level in 6 hours and daily while on heparin Continue to monitor H&H and platelets   8/11 2200 heparin level 0.28. 1300 unit bolus and increase rate to 1650 units/hr. Recheck in 8 hours.  Ricardo Erickson S 07/22/2017,10:51 PM

## 2017-07-22 NOTE — Progress Notes (Signed)
Pharmacy Antibiotic Note  Ricardo Erickson is a 76 y.o. male admitted on 07/19/2017 with pneumonia.  Pharmacy has been consulted for zosyn dosing.  Plan: Zosyn 3.375g IV q8h (4 hour infusion).  Height: 5\' 9"  (175.3 cm) Weight: 205 lb 11.2 oz (93.3 kg) IBW/kg (Calculated) : 70.7  Temp (24hrs), Avg:99.1 F (37.3 C), Min:98.9 F (37.2 C), Max:99.2 F (37.3 C)   Recent Labs Lab 07/19/17 1734 07/20/17 0357 07/21/17 0503 07/22/17 0358  WBC 18.4* 15.6* 15.3* 14.7*  CREATININE 1.31* 1.19 1.15 0.99    Estimated Creatinine Clearance: 73.8 mL/min (by C-G formula based on SCr of 0.99 mg/dL).    No Known Allergies  Antimicrobials this admission: 8/10 azithromycin 500mg  po >> 8/10 8/9 ceftriaxone 2gm iv >> 8/10   Microbiology results: 8/8 UCx: multiple species    Thank you for allowing pharmacy to be a part of this patient's care.  Thomasenia Sales, PharmD, MBA, [facility]

## 2017-07-22 NOTE — Progress Notes (Signed)
[facility] at [place] NAME: Shafin Pollio    MR#:  [id number]  DATE OF BIRTH:  05-13-1941  SUBJECTIVE:  CHIEF COMPLAINT:   Chief Complaint  Patient presents with  . Altered Mental Status  . Hallucinations  patient continues to complain or shortness of breath he's been using the incentive spirometer     REVIEW OF SYSTEMS:  Review of Systems  Constitutional: Positive for malaise/fatigue. Negative for chills, fever and weight loss.  HENT: Negative for nosebleeds and sore throat.   Eyes: Negative for blurred vision.  Respiratory: Positive for cough and shortness of breath. Negative for wheezing.   Cardiovascular: Negative for chest pain, orthopnea, leg swelling and PND.  Gastrointestinal: Negative for abdominal pain, constipation, diarrhea, heartburn, nausea and vomiting.  Genitourinary: Negative for dysuria and urgency.  Musculoskeletal: Positive for falls and joint pain. Negative for back pain.  Skin: Negative for rash.  Neurological: Positive for weakness. Negative for dizziness, speech change, focal weakness and headaches.  Endo/Heme/Allergies: Does not bruise/bleed easily.  Psychiatric/Behavioral: Positive for hallucinations. Negative for depression.   DRUG ALLERGIES:  No Known Allergies VITALS:  Blood pressure 136/70, pulse 61, temperature 98.9 F (37.2 C), temperature source Oral, resp. rate 20, height 5\' 9"  (1.753 m), weight 205 lb 11.2 oz (93.3 kg), SpO2 93 %. PHYSICAL EXAMINATION:  Physical Exam  Constitutional: He is oriented to person, place, and time and well-developed, well-nourished, and in no distress.  HENT:  Head: Normocephalic and atraumatic.  Eyes: Pupils are equal, round, and reactive to light. Conjunctivae and EOM are normal.  Neck: Normal range of motion. Neck supple. No tracheal deviation present. No thyromegaly present.  Cardiovascular: Normal rate, regular rhythm and normal heart sounds.   Pulmonary/Chest:  Accessory muscle usage present. He is in respiratory distress. He has decreased breath sounds in the right lower field and the left lower field. He has no wheezes. He exhibits no tenderness.  Abdominal: Soft. Bowel sounds are normal. He exhibits no distension. There is no tenderness.  Musculoskeletal: Normal range of motion.  Neurological: He is alert and oriented to person, place, and time. No cranial nerve deficit.  Skin: Skin is warm and dry. No rash noted.  Psychiatric: Mood and affect normal.   LABORATORY PANEL:  Male CBC  Recent Labs Lab 07/22/17 0358  WBC 14.7*  HGB 14.4  HCT 43.4  PLT 150   ------------------------------------------------------------------------------------------------------------------ Chemistries   Recent Labs Lab 07/19/17 1734  07/21/17 0503 07/22/17 0358  NA 139  < > 140 141  K 3.4*  < > 3.0* 3.9  CL 98*  < > 99* 103  CO2 30  < > 31 30  GLUCOSE 102*  < > 93 106*  BUN 15  < > 13 10  CREATININE 1.31*  < > 1.15 0.99  CALCIUM 9.4  < > 8.4* 8.6*  MG  --   --  1.9  --   AST 23  --   --   --   ALT 11*  --   --   --   ALKPHOS 94  --   --   --   BILITOT 1.7*  --   --   --   < > = values in this interval not displayed. RADIOLOGY:  No results found. ASSESSMENT AND PLAN:   * UTI (urinary tract infection)  - Based on UA, continue IV Rocephin, urine culture growing mixed organisms -Urine cultures pending which were reordered  * Possible  pneumonia - patient currently on 5 L of oxygen he was not on any oxygen prior to arrival I will obtain a CT scan of the chest Changes antibiotics to Zosyn and azithromycin  * Hypokalemia - replaced  * COPD (chronic obstructive pulmonary disease) ([place]) - continue home meds Patient was on nebulizers scheduled at home I will go ahead and restart that  * Constipation - continue stool softener - He reports last bowel movement on Tuesday   * Anxiety - continue BuSpar   * A-fib ([place]) - continue amiodarone,  digoxin, Cardizem    * BPH (benign prostatic hyperplasia) - continue home medications     Physical therapy evaluation - recommends short-term rehabilitation, social worker working on placement  All the records are reviewed and case discussed with Care Automotive engineer. Management plans discussed with the patient, family (son at bedside) and they are in agreement.  CODE STATUS: DNR  TOTAL TIME TAKING CARE OF THIS PATIENT: 32 minutes.   More than 50% of the time was spent in counseling/coordination of care: YES  POSSIBLE D/C early next week, DEPENDING ON CLINICAL CONDITION.   Dustin Flock M.D on 07/22/2017 at 12:51 PM  Between 7am to 6pm - Pager - [phone]  After 6pm go to [email or web address] - Proofreader  [employer] [place] [facility]  Office  [phone]  CC: Primary care physician; Rusty Aus, MD  Note: This dictation was prepared with Dragon dictation along with smaller phrase technology. Any transcriptional errors that result from this process are unintentional.

## 2017-07-23 DIAGNOSIS — I2699 Other pulmonary embolism without acute cor pulmonale: Secondary | ICD-10-CM

## 2017-07-23 DIAGNOSIS — F419 Anxiety disorder, unspecified: Secondary | ICD-10-CM

## 2017-07-23 DIAGNOSIS — I2609 Other pulmonary embolism with acute cor pulmonale: Secondary | ICD-10-CM

## 2017-07-23 DIAGNOSIS — I481 Persistent atrial fibrillation: Secondary | ICD-10-CM

## 2017-07-23 LAB — HEPARIN LEVEL (UNFRACTIONATED)
HEPARIN UNFRACTIONATED: 0.37 [IU]/mL (ref 0.30–0.70)
Heparin Unfractionated: 0.26 IU/mL — ABNORMAL LOW (ref 0.30–0.70)

## 2017-07-23 LAB — CBC
HCT: 41.7 % (ref 40.0–52.0)
Hemoglobin: 14 g/dL (ref 13.0–18.0)
MCH: 30.1 pg (ref 26.0–34.0)
MCHC: 33.6 g/dL (ref 32.0–36.0)
MCV: 89.7 fL (ref 80.0–100.0)
PLATELETS: 172 10*3/uL (ref 150–440)
RBC: 4.65 MIL/uL (ref 4.40–5.90)
RDW: 14.1 % (ref 11.5–14.5)
WBC: 15.3 10*3/uL — ABNORMAL HIGH (ref 3.8–10.6)

## 2017-07-23 LAB — BASIC METABOLIC PANEL
ANION GAP: 11 (ref 5–15)
BUN: 11 mg/dL (ref 6–20)
CO2: 26 mmol/L (ref 22–32)
Calcium: 8.3 mg/dL — ABNORMAL LOW (ref 8.9–10.3)
Chloride: 101 mmol/L (ref 101–111)
Creatinine, Ser: 0.92 mg/dL (ref 0.61–1.24)
GFR calc Af Amer: >60 mL/min (ref >60)
GLUCOSE: 118 mg/dL — AB (ref 65–99)
POTASSIUM: 3.2 mmol/L — AB (ref 3.5–5.1)
Sodium: 138 mmol/L (ref 135–145)

## 2017-07-23 LAB — PROCALCITONIN: Procalcitonin: 0.18 ng/mL

## 2017-07-23 MED ORDER — VANCOMYCIN HCL IN DEXTROSE 1-5 GM/200ML-% IV SOLN
1000.0000 mg | Freq: Once | INTRAVENOUS | Status: AC
  Administered 2017-07-23: 1000 mg via INTRAVENOUS
  Filled 2017-07-23: qty 200

## 2017-07-23 MED ORDER — ALPRAZOLAM 0.25 MG PO TABS
0.2500 mg | ORAL_TABLET | Freq: Three times a day (TID) | ORAL | Status: DC | PRN
  Administered 2017-07-23: 0.25 mg via ORAL
  Filled 2017-07-23 (×2): qty 1

## 2017-07-23 MED ORDER — RISPERIDONE 0.5 MG PO TABS
0.5000 mg | ORAL_TABLET | Freq: Every day | ORAL | Status: DC
  Administered 2017-07-23: 0.5 mg via ORAL
  Filled 2017-07-23: qty 1

## 2017-07-23 MED ORDER — FAMOTIDINE 20 MG PO TABS
20.0000 mg | ORAL_TABLET | Freq: Two times a day (BID) | ORAL | Status: DC
  Administered 2017-07-23 – 2017-07-28 (×10): 20 mg via ORAL
  Filled 2017-07-23 (×10): qty 1

## 2017-07-23 MED ORDER — POTASSIUM CHLORIDE 20 MEQ PO PACK
60.0000 meq | PACK | Freq: Once | ORAL | Status: AC
  Administered 2017-07-23: 60 meq via ORAL
  Filled 2017-07-23: qty 3

## 2017-07-23 MED ORDER — VANCOMYCIN HCL 10 G IV SOLR
1250.0000 mg | Freq: Two times a day (BID) | INTRAVENOUS | Status: DC
  Administered 2017-07-23 – 2017-07-24 (×2): 1250 mg via INTRAVENOUS
  Filled 2017-07-23 (×3): qty 1250

## 2017-07-23 NOTE — Progress Notes (Signed)
Chaplain received a call that patient requested prayer. When Chaplain arrived patient asked if Chaplain could sit down because he needed someone to talk to. Patient stated that he has pneumonia and a broken back. He said that the staff has short patience with him. He also implied that he doesn't like narcotics because it makes him have bad dreams and sees monsters and demons. He also is afraid of the dark and afraid to go to sleep. He cannot sleep with the lights off or the door closed. He said that the sitters are hateful to him. They are nice when people are around but once the people leave they let him have it.  Ricardo Erickson said he also thinks the doctors are experimenting on him. I feel he may need to be evaluated further.

## 2017-07-23 NOTE — Progress Notes (Signed)
Noted that vital signs did not transfer at time of Rapid response intervention at approx. 1330. bp was documented, other VS not available for review in [facility].

## 2017-07-23 NOTE — Progress Notes (Signed)
O2 sats 87% on 5L O2. RRT paged and Dr patel paged.

## 2017-07-23 NOTE — Progress Notes (Signed)
CH made a follow up visit. Pt was resting comfortably. MD was addressing Son and daughter-in-law. [place] informed the family that Spiritual Care is available 24/7. Family appreciated the visit.    07/23/17 1500  Clinical Encounter Type  Visited With Patient;Health care provider  Visit Type Initial;Code (Rapid Response )  Referral From Nurse  Consult/Referral To Chaplain  Spiritual Encounters  Spiritual Needs Emotional  Stress Factors  Patient Stress Factors Health changes

## 2017-07-23 NOTE — Progress Notes (Signed)
[facility] at [place] NAME: Tremel Setters    MR#:  [id number]  DATE OF BIRTH:  Oct 23, 1941  SUBJECTIVE:  CHIEF COMPLAINT:   Chief Complaint  Patient presents with  . Altered Mental Status  . Hallucinations   According to the nurse patient was very agitated earlier and had to receive Xanax now he is sleepy     REVIEW OF SYSTEMS:  Review of Systems  Unable to perform ROS: Mental status change   DRUG ALLERGIES:  No Known Allergies VITALS:  Blood pressure (!) 144/67, pulse 60, temperature 98.8 F (37.1 C), temperature source Oral, resp. rate (!) 22, height 5\' 9"  (1.753 m), weight 205 lb 11.2 oz (93.3 kg), SpO2 93 %. PHYSICAL EXAMINATION:  Physical Exam  Constitutional: He appears distressed.  HENT:  Head: Normocephalic and atraumatic.  Eyes: Pupils are equal, round, and reactive to light. Conjunctivae are normal.  Neck: Normal range of motion. Neck supple. No tracheal deviation present. No thyromegaly present.  Cardiovascular: Normal rate, regular rhythm and normal heart sounds.   Pulmonary/Chest: Accessory muscle usage present. He is in respiratory distress. He has no decreased breath sounds. He has no wheezes. He has rhonchi in the right upper field, the right middle field, the right lower field, the left upper field, the left middle field and the left lower field. He exhibits no tenderness.  Abdominal: Soft. Bowel sounds are normal. He exhibits no distension. There is no tenderness.  Musculoskeletal: Normal range of motion.  Neurological: He is disoriented. No cranial nerve deficit.  Skin: Skin is warm and dry. No rash noted.  Psychiatric: Mood and affect normal.   LABORATORY PANEL:  Male CBC  Recent Labs Lab 07/23/17 0727  WBC 15.3*  HGB 14.0  HCT 41.7  PLT 172   ------------------------------------------------------------------------------------------------------------------ Chemistries   Recent Labs Lab  07/19/17 1734  07/21/17 0503  07/23/17 0727  NA 139  < > 140  < > 138  K 3.4*  < > 3.0*  < > 3.2*  CL 98*  < > 99*  < > 101  CO2 30  < > 31  < > 26  GLUCOSE 102*  < > 93  < > 118*  BUN 15  < > 13  < > 11  CREATININE 1.31*  < > 1.15  < > 0.92  CALCIUM 9.4  < > 8.4*  < > 8.3*  MG  --   --  1.9  --   --   AST 23  --   --   --   --   ALT 11*  --   --   --   --   ALKPHOS 94  --   --   --   --   BILITOT 1.7*  --   --   --   --   < > = values in this interval not displayed. RADIOLOGY:  Ct Angio Chest Pe W Or Wo Contrast  Result Date: 07/22/2017 CLINICAL DATA:  Increasing shortness of breath and chest pain EXAM: CT ANGIOGRAPHY CHEST WITH CONTRAST TECHNIQUE: Multidetector CT imaging of the chest was performed using the standard protocol during bolus administration of intravenous contrast. Multiplanar CT image reconstructions and MIPs were obtained to evaluate the vascular anatomy. CONTRAST:  75 mL Isovue 370. COMPARISON:  None. FINDINGS: Cardiovascular: Aortic atherosclerotic changes noted without aneurysmal dilatation. Pulmonary artery demonstrates bilateral pulmonary emboli is slightly greater on the right than the left. Mild right ventricular  dilatation is noted consistent with right heart strain. Mediastinum/Nodes: Thoracic inlet is within normal limits. Few scattered small mediastinal lymph nodes are noted. No significant hilar adenopathy is seen. The esophagus is within normal limits. Lungs/Pleura: Diffuse emphysematous changes are identified. Bilateral lower lobe consolidation is noted as well as small right pleural effusion. Patchy upper lobe infiltrative changes are noted on the right. Upper Abdomen: Upper abdomen is within normal limits. Musculoskeletal: Degenerative change of thoracic spine is seen. No compression deformities are noted. Review of the MIP images confirms the above findings. IMPRESSION: Positive for acute PE with CT evidence of right heart strain (RV/LV Ratio = 0.96) consistent  with at least submassive (intermediate risk) PE. The presence of right heart strain has been associated with an increased risk of morbidity and mortality. Bilateral lower lobe consolidation with evidence of small right pleural effusion. Patchy bilateral upper lobe infiltrates. Aortic Atherosclerosis (ICD10-I70.0) and Emphysema (ICD10-J43.9). These results will be called to the ordering clinician or representative by the Radiologist Assistant, and communication documented in the PACS or zVision Dashboard. Electronically Signed   By: Inez Catalina M.D.   On: 07/22/2017 14:12   ASSESSMENT AND PLAN:   *Acute hypoxic respiratory failure due to pneumonia and pulmonary embolism pneumonia- I will add vancomycin to current therapy continue Zosyn and discontinue azithromycin Bilateral submassive pulmonary embolism patient currently on a heparin drip which I will continue   * UTI (urinary tract infection)  - Continue current antibiotics due to pneumonia  * Hypokalemia - Continue to replace recheck in the morning  * COPD (chronic obstructive pulmonary disease) ([place]) - continue home meds Continue nebulizer therapy and inhalers as taking at home  * Constipation - continue stool softener   * Anxiety - continue BuSpar when necessary Xanax   * A-fib ([place]) - continue amiodarone, digoxin, Cardizem    * BPH (benign prostatic hyperplasia) - continue home medications     Physical therapy evaluation - recommends short-term rehabilitation, social worker working on placement  All the records are reviewed and case discussed with Care Automotive engineer. Management plans discussed with the patient, family (son at bedside) and they are in agreement.  CODE STATUS: DNR  TOTAL TIME TAKING CARE OF THIS PATIENT: 35 minutes of critical care time spent patient now with pulmonary embolism on IV heparin drip at high risk of cardiopulmonary arrest  More than 50% of the time was spent in counseling/coordination  of care: YES  POSSIBLE D/C early next week, DEPENDING ON CLINICAL CONDITION.   Dustin Flock M.D on 07/23/2017 at 11:56 AM  Between 7am to 6pm - Pager - [phone]  After 6pm go to [email or web address] - Proofreader  [employer] [facility]  Office  [phone]  CC: Primary care physician; Rusty Aus, MD  Note: This dictation was prepared with Dragon dictation along with smaller phrase technology. Any transcriptional errors that result from this process are unintentional.

## 2017-07-23 NOTE — Progress Notes (Signed)
[facility] responded to a RR in room 153. Pt was being attended to by the medical staff. CH made contact with the son, and informed him that the Pt is being transferred to IC-03 for better monitoring. [place] escorted Pt to IC-03 and waited for Son.     07/23/17 1500  Clinical Encounter Type  Visited With Patient;Health care provider  Visit Type Initial;Code (Rapid Response )  Referral From Nurse  Consult/Referral To Chaplain  Spiritual Encounters  Spiritual Needs Emotional  Stress Factors  Patient Stress Factors Health changes

## 2017-07-23 NOTE — Progress Notes (Signed)
[facility] [place] NAME: Dina Mobley    MR#:  [id number]  DATE OF BIRTH:  01/07/1941  SUBJECTIVE:  CHIEF COMPLAINT:   Chief Complaint  Patient presents with  . Altered Mental Status  . Hallucinations    pt continues to have difficulty with keeping his saturation up dropping  In 80s , currently on 5l oxygne    REVIEW OF SYSTEMS:  Review of Systems  Unable to perform ROS: Mental status change   DRUG ALLERGIES:  No Known Allergies VITALS:  Blood pressure (!) 144/67, pulse 60, temperature 98.8 F (37.1 C), temperature source Oral, resp. rate (!) 22, height 5\' 9"  (1.753 m), weight 205 lb 11.2 oz (93.3 kg), SpO2 93 %. PHYSICAL EXAMINATION:  Physical Exam  Constitutional: He appears distressed.  HENT:  Head: Normocephalic and atraumatic.  Eyes: Pupils are equal, round, and reactive to light. Conjunctivae are normal.  Neck: Normal range of motion. Neck supple. No tracheal deviation present. No thyromegaly present.  Cardiovascular: Normal rate, regular rhythm and normal heart sounds.   Pulmonary/Chest: Accessory muscle usage present. He is in respiratory distress. He has no decreased breath sounds. He has no wheezes. He has rhonchi in the right upper field, the right middle field, the right lower field, the left upper field, the left middle field and the left lower field. He exhibits no tenderness.  Abdominal: Soft. Bowel sounds are normal. He exhibits no distension. There is no tenderness.  Musculoskeletal: Normal range of motion.  Neurological: He is disoriented. No cranial nerve deficit.  Skin: Skin is warm and dry. No rash noted.  Psychiatric: Mood and affect normal.   LABORATORY PANEL:  Male CBC  Recent Labs Lab 07/23/17 0727  WBC 15.3*  HGB 14.0  HCT 41.7  PLT 172   ------------------------------------------------------------------------------------------------------------------ Chemistries   Recent Labs Lab  07/19/17 1734  07/21/17 0503  07/23/17 0727  NA 139  < > 140  < > 138  K 3.4*  < > 3.0*  < > 3.2*  CL 98*  < > 99*  < > 101  CO2 30  < > 31  < > 26  GLUCOSE 102*  < > 93  < > 118*  BUN 15  < > 13  < > 11  CREATININE 1.31*  < > 1.15  < > 0.92  CALCIUM 9.4  < > 8.4*  < > 8.3*  MG  --   --  1.9  --   --   AST 23  --   --   --   --   ALT 11*  --   --   --   --   ALKPHOS 94  --   --   --   --   BILITOT 1.7*  --   --   --   --   < > = values in this interval not displayed. RADIOLOGY:  Ct Angio Chest Pe W Or Wo Contrast  Result Date: 07/22/2017 CLINICAL DATA:  Increasing shortness of breath and chest pain EXAM: CT ANGIOGRAPHY CHEST WITH CONTRAST TECHNIQUE: Multidetector CT imaging of the chest was performed using the standard protocol during bolus administration of intravenous contrast. Multiplanar CT image reconstructions and MIPs were obtained to evaluate the vascular anatomy. CONTRAST:  75 mL Isovue 370. COMPARISON:  None. FINDINGS: Cardiovascular: Aortic atherosclerotic changes noted without aneurysmal dilatation. Pulmonary artery demonstrates bilateral pulmonary emboli is slightly greater on the right than the left. Mild right  ventricular dilatation is noted consistent with right heart strain. Mediastinum/Nodes: Thoracic inlet is within normal limits. Few scattered small mediastinal lymph nodes are noted. No significant hilar adenopathy is seen. The esophagus is within normal limits. Lungs/Pleura: Diffuse emphysematous changes are identified. Bilateral lower lobe consolidation is noted as well as small right pleural effusion. Patchy upper lobe infiltrative changes are noted on the right. Upper Abdomen: Upper abdomen is within normal limits. Musculoskeletal: Degenerative change of thoracic spine is seen. No compression deformities are noted. Review of the MIP images confirms the above findings. IMPRESSION: Positive for acute PE with CT evidence of right heart strain (RV/LV Ratio = 0.96) consistent  with at least submassive (intermediate risk) PE. The presence of right heart strain has been associated with an increased risk of morbidity and mortality. Bilateral lower lobe consolidation with evidence of small right pleural effusion. Patchy bilateral upper lobe infiltrates. Aortic Atherosclerosis (ICD10-I70.0) and Emphysema (ICD10-J43.9). These results will be called to the ordering clinician or representative by the Radiologist Assistant, and communication documented in the PACS or zVision Dashboard. Electronically Signed   By: Inez Catalina M.D.   On: 07/22/2017 14:12   ASSESSMENT AND PLAN:   *Acute hypoxic respiratory failure due to pneumonia and pulmonary embolism pneumonia- I will add vancomycin to current therapy continue Zosyn and discontinue azithromycin Bilateral submassive pulmonary embolism patient currently on a heparin drip which I will continue Stat ABG  I have D/w Dr. Felicie Morn of pulmonary regarding transfer to to stepdown. Stat cxr as well       All the records are reviewed and case discussed with Care Management/Social Worker. Management plans discussed with the patient, family (son at bedside) and they are in agreement.  CODE STATUS: DNR  TOTAL TIME TAKING CARE OF THIS PATIENT: 35 minutes of  Additional critical care time spent  More than 50% of the time was spent in counseling/coordination of care: YES  POSSIBLE D/C early next week, DEPENDING ON CLINICAL CONDITION.   Dustin Flock M.D on 07/23/2017 at 1:50 PM  Between 7am to 6pm - Pager - [phone]  After 6pm go to [email or web address] - Proofreader  [facility]  Office  [phone]  CC: Primary care physician; Rusty Aus, MD  Note: This dictation was prepared with Dragon dictation along with smaller phrase technology. Any transcriptional errors that result from this process are unintentional.

## 2017-07-23 NOTE — Progress Notes (Signed)
Placed on 55% venturi mask

## 2017-07-23 NOTE — Progress Notes (Signed)
Rapid Response called to room 153. Primary nurse at bedside. Patient noted to be very anxious,restless and tachypneic. 02 currently at [facility]. Nurse reports 02 sats have been dropping into the 80's. RT at bedside to draw ABG. Dr. Dustin Flock at bedside and ordered to transfer patient to ICU stepdown. Patient transferred to ICU 3.

## 2017-07-23 NOTE — Progress Notes (Signed)
ANTICOAGULATION CONSULT NOTE - Follow Up Consult  Pharmacy Consult for heparin drip Indication: pulmonary embolus  No Known Allergies  Patient Measurements: Height: 5\' 9"  (175.3 cm) Weight: 205 lb 11.2 oz (93.3 kg) IBW/kg (Calculated) : 70.7 Heparin Dosing Weight: 89.9 kg  Vital Signs: Temp: 98.8 F (37.1 C) (08/11 2122) Temp Source: Oral (08/11 2122) BP: 144/67 (08/11 2122) Pulse Rate: 60 (08/11 2122)  Labs:  Recent Labs  07/21/17 0503 07/22/17 0358 07/22/17 1441 07/22/17 2207 07/23/17 0727  HGB 14.4 14.4  --   --  14.0  HCT 43.6 43.4  --   --  41.7  PLT 157 150  --   --  172  APTT  --   --  34  --   --   LABPROT  --   --  14.6  --   --   INR  --   --  1.13  --   --   HEPARINUNFRC  --   --   --  0.28* 0.37  CREATININE 1.15 0.99  --   --  0.92    Estimated Creatinine Clearance: 79.4 mL/min (by C-G formula based on SCr of 0.92 mg/dL).   Medical History: Past Medical History:  Diagnosis Date  . Chronic back pain   . Depression   . Emphysema lung ([place])   . Hypertension   . Personal history of tobacco use, presenting hazards to health 08/06/2015  . Pollen allergies   . PTSD (post-traumatic stress disorder)     Medications:  Scheduled:  . amiodarone  200 mg Oral Daily  . aspirin EC  81 mg Oral Daily  . azithromycin  250 mg Oral Daily  . busPIRone  7.5 mg Oral BID  . digoxin  0.125 mg Oral Daily  . diltiazem  240 mg Oral Daily  . feeding supplement (ENSURE ENLIVE)  237 mL Oral BID BM  . furosemide  20 mg Oral Daily  . lidocaine  1 patch Transdermal Q24H  . mometasone-formoterol  2 puff Inhalation BID  . nicotine  21 mg Transdermal Daily  . pramipexole  1 mg Oral QHS  . senna-docusate  2 tablet Oral BID  . tamsulosin  0.4 mg Oral Daily  . tiotropium  18 mcg Inhalation Daily   Infusions:  . heparin 1,650 Units/hr (07/23/17 0453)  . piperacillin-tazobactam (ZOSYN)  IV 3.375 g (07/23/17 0456)   PRN: acetaminophen **OR** acetaminophen, albuterol,  ALPRAZolam, ondansetron **OR** ondansetron (ZOFRAN) IV, traMADol  Assessment: 76 year old male with PE to be anticoagulated on heparin drip per pharmacy protocol.  Goal of Therapy:  Heparin level 0.3-0.7 units/ml Monitor platelets by anticoagulation protocol: Yes   Plan:  Give 4000 units bolus x 1 Start heparin infusion at 1450 units/hr Check anti-Xa level in 6 hours and daily while on heparin Continue to monitor H&H and platelets   8/11 2200 heparin level 0.28. 1300 unit bolus and increase rate to 1650 units/hr. Recheck in 8 hours.   8/12 0730 HL = 0.37. Continue current drip rate. Recheck tonight at 15:30.   Olivia Canter, RPH 07/23/2017,8:27 AM

## 2017-07-23 NOTE — Progress Notes (Addendum)
ANTICOAGULATION CONSULT NOTE - Follow Up Consult  Pharmacy Consult for heparin drip Indication: pulmonary embolus  No Known Allergies  Patient Measurements: Height: 5\' 9"  (175.3 cm) Weight: 205 lb 11.2 oz (93.3 kg) IBW/kg (Calculated) : 70.7 Heparin Dosing Weight: 89.9 kg  Vital Signs: BP: 135/80 (08/12 1800) Pulse Rate: 80 (08/12 1800)  Labs:  Recent Labs  07/21/17 0503 07/22/17 0358 07/22/17 1441 07/22/17 2207 07/23/17 0727 07/23/17 1740  HGB 14.4 14.4  --   --  14.0  --   HCT 43.6 43.4  --   --  41.7  --   PLT 157 150  --   --  172  --   APTT  --   --  34  --   --   --   LABPROT  --   --  14.6  --   --   --   INR  --   --  1.13  --   --   --   HEPARINUNFRC  --   --   --  0.28* 0.37 0.26*  CREATININE 1.15 0.99  --   --  0.92  --     Estimated Creatinine Clearance: 79.4 mL/min (by C-G formula based on SCr of 0.92 mg/dL).   Medical History: Past Medical History:  Diagnosis Date  . Chronic back pain   . Depression   . Emphysema lung ([place])   . Hypertension   . Personal history of tobacco use, presenting hazards to health 08/06/2015  . Pollen allergies   . PTSD (post-traumatic stress disorder)     Medications:  Scheduled:  . amiodarone  200 mg Oral Daily  . aspirin EC  81 mg Oral Daily  . busPIRone  7.5 mg Oral BID  . digoxin  0.125 mg Oral Daily  . diltiazem  240 mg Oral Daily  . famotidine  20 mg Oral BID  . feeding supplement (ENSURE ENLIVE)  237 mL Oral BID BM  . furosemide  20 mg Oral Daily  . lidocaine  1 patch Transdermal Q24H  . mometasone-formoterol  2 puff Inhalation BID  . nicotine  21 mg Transdermal Daily  . pramipexole  1 mg Oral QHS  . risperiDONE  0.5 mg Oral QHS  . senna-docusate  2 tablet Oral BID  . tamsulosin  0.4 mg Oral Daily  . tiotropium  18 mcg Inhalation Daily   Infusions:  . heparin 1,650 Units/hr (07/23/17 0453)  . piperacillin-tazobactam (ZOSYN)  IV 3.375 g (07/23/17 1509)  . vancomycin     PRN: acetaminophen **OR**  acetaminophen, albuterol, ALPRAZolam, ondansetron **OR** ondansetron (ZOFRAN) IV, traMADol  Assessment: 76 year old male with PE to be anticoagulated on heparin drip per pharmacy protocol.  Goal of Therapy:  Heparin level 0.3-0.7 units/ml Monitor platelets by anticoagulation protocol: Yes   Plan:  Give 4000 units bolus x 1 Start heparin infusion at 1450 units/hr Check anti-Xa level in 6 hours and daily while on heparin Continue to monitor H&H and platelets   8/11 2200 heparin level 0.28. 1300 unit bolus and increase rate to 1650 units/hr. Recheck in 8 hours.    8/12 0730 HL = 0.37. Continue current drip rate. Recheck tonight at 15:30.   8/12 1740 HL: 0.26. Level is subtherapeutic. Will increase infusion to 1800u/hr and recheck level in 6 hours.   Pernell Dupre, PharmD, BCPS Clinical Pharmacist 07/23/2017 7:07 PM   8/13 0400 heparin level 0.41. Continue current regimen. Recheck in 8 hours to confirm.  [employer],  PharmD, BCPS  07/24/17 4:33 AM

## 2017-07-23 NOTE — Progress Notes (Signed)
Pt requesting chaplain to come and pray with pt. Chaplain called and is to be to pt room after finishes another page for ED. Informed pt and pt verbalized understanding. Will cont to monitor

## 2017-07-23 NOTE — Consult Note (Signed)
[facility] Consultation    SYNOPSIS   The patient is a 76 year old male, history of COPD, severe anxiety, status post kyphoplasty with declining course. Admitted with UTI, now transferred to ICU after CT shows pulmonary embolism with bilateral pneumonia.  ASSESSMENT/PLAN    PULMONARY A:Acute on chronic respiratory failure, likely multifactorial from pulmonary medicine, bibasilar atelectasis with bibasilar pneumonia, as well as possible pulmonary infarction. --Tolerating nasal cannula. -DO NOT RESUSCITATE. P:   -Continue IV heparin infusion. -Continue broad-spectrum antibiotics. -Continue nebulizers. -Continue nasal cannula oxygen, may use high flow as needed.  VENTILATOR SETTINGS: FiO2 (%):  [55 %] 55 %  CARDIOVASCULAR A: Atrial fibrillation, currently rate controlled. P:  -Continue oral amiodarone.  HEMODYNAMICS:    RENAL A:  -- P:   -- INTAKE / OUTPUT:  Intake/Output Summary (Last 24 hours) at 07/23/17 1542 Last data filed at 07/23/17 0800  Gross per 24 hour  Intake            600.2 ml  Output                0 ml  Net            600.2 ml    GASTROINTESTINAL A:  -- P:   Famotidine.   HEMATOLOGIC A:  Leukocytosis, likely reactive. P:  Continue to monitor.  INFECTIOUS A:  Pneumonia. P:   Continue empiric antibiotics. Objective procalcitonin.  Micro/culture results:  BCx2 Pending UC --8/8 Multiple species present Sputum--  Antibiotics: 8/8 Cefriaxone/Azithromycin >>8/11 8/11 Zosyn >> 8/12 vancomycin  >>   ENDOCRINE A:   P:   Monitor blood glucose.   NEUROLOGIC A:  Acute delirium. Severe anxiety.   P:   -Continue baseline anxiolytics. -Start p.m. Risperdal. -May need to add Precedex.   MAJOR EVENTS/TEST RESULTS: -Admitted 06/24/2017 s/p kyphoplasty 2/59, Complicated by A. fib with RVR, severe delirium with agitation necessitating ICU Transfer and initiation of a Precedex drip. Discharged on  7/19. -  Best Practices  DVT Prophylaxis: IV heparin GI Prophylaxis: Famotidine.   --------------------------------------- Discussed CODE STATUS with family at bedside, explained that patient may get worse before he gets better. I confirmed with family the patient's DO NOT RESUSCITATE status, including no intubation.  ---------------------------------------   Name: ZEALAND BOYETT MRN: [id number] DOB: 09-03-1941    ADMISSION DATE:  07/19/2017 CONSULTATION DATE:  07/23/2017  REFERRING MD :  Dr. Posey Pronto  CHIEF COMPLAINT:  Dyspnea   HISTORY OF PRESENT ILLNESS:   The patient is a 76 year old male recently discharged on 06/27/2017 after a kyphoplasty. That admission was completed by A. fib with RVR, and severe delirium and agitation requiring ICU transfer, the patient was started on Precedex. He was discharged to rehabilitation. However, since that time, he did become progressively weaker, his course has declined according to his family who would provide history at the bedside. Currently the patient is fairly confused and cannot provide a coherent history. Family notes that he is been getting progressively more confused since being discharged. He was admitted on 07/19/2017 and started on treatment for a UTI.  He has a history of COPD, however, his breathing had grown worse, he was sent for CT chest yesterday. I personally reviewed, images. It was consistent with chronic emphysema, comp, complicated by bibasilar atelectasis and pneumonia, as well as bilateral segmental pulmonary emboli with possible pulmonary infarct.  PAST MEDICAL HISTORY :  Past Medical History:  Diagnosis Date  . Chronic back pain   . Depression   .  Emphysema lung ([place])   . Hypertension   . Personal history of tobacco use, presenting hazards to health 08/06/2015  . Pollen allergies   . PTSD (post-traumatic stress disorder)    Past Surgical History:  Procedure Laterality Date  . BACK SURGERY    . KYPHOPLASTY N/A  06/27/2017   Procedure: KYPHOPLASTY -L5;  Surgeon: Hessie Knows, MD;  Location: [facility] ORS;  Service: Orthopedics;  Laterality: N/A;  . [place]   Prior to Admission medications   Medication Sig Start Date End Date Taking? Authorizing Provider  acetaminophen (TYLENOL) 650 MG CR tablet Take 650 mg by mouth every 8 (eight) hours as needed for pain.   Yes [provider]  albuterol (PROVENTIL HFA;VENTOLIN HFA) 108 (90 Base) MCG/ACT inhaler Inhale 2 puffs into the lungs as needed for wheezing or shortness of breath.   Yes [provider]  albuterol (PROVENTIL) (2.5 MG/3ML) 0.083% nebulizer solution Inhale 2.5 mg into the lungs every 6 (six) hours as needed.  01/15/15  Yes [provider]  amiodarone (PACERONE) 400 MG tablet Take 1 tablet (400 mg total) by mouth 2 (two) times daily. Patient taking differently: Take 200 mg by mouth daily.  06/29/17  Yes Dustin Flock, MD  aspirin EC 325 MG tablet Take 1 tablet (325 mg total) by mouth daily. Patient taking differently: Take 81 mg by mouth daily.  06/29/17 06/29/18 Yes Dustin Flock, MD  busPIRone (BUSPAR) 7.5 MG tablet Take 1 tablet twice daily for anxiety. 06/19/17  Yes Pleas Koch, NP  cyclobenzaprine (FLEXERIL) 5 MG tablet Take 5 mg by mouth 2 (two) times daily as needed for muscle spasms. 07/18/17  Yes [provider]  digoxin (LANOXIN) 0.125 MG tablet Take 1 tablet (0.125 mg total) by mouth daily. 06/30/17  Yes Dustin Flock, MD  diltiazem (CARDIZEM CD) 240 MG 24 hr capsule Take 1 capsule (240 mg total) by mouth daily. 06/30/17  Yes Dustin Flock, MD  Fluticasone-Salmeterol (ADVAIR DISKUS) 250-50 MCG/DOSE AEPB Inhale 1 puff into the lungs every 12 (twelve) hours. Reported on 04/01/2016   Yes [provider]  furosemide (LASIX) 20 MG tablet Take 20 mg by mouth daily. 07/17/17 07/17/18 Yes [provider]  pramipexole (MIRAPEX) 1 MG tablet Take 1 mg by mouth at bedtime.    Yes [provider]  tamsulosin (FLOMAX) 0.4 MG CAPS capsule Take 0.4 mg by mouth daily.   Yes [provider]  tiotropium (SPIRIVA) 18 MCG inhalation capsule Place 18 mcg into inhaler and inhale daily.   Yes [provider]  traMADol (ULTRAM) 50 MG tablet Take 50 mg by mouth 2 (two) times daily.   Yes [provider]  ALPRAZolam Duanne Moron) 1 MG tablet Take 1 tablet (1 mg total) by mouth 3 (three) times daily as needed for anxiety. Patient not taking: Reported on 07/19/2017 06/29/17   Dustin Flock, MD  docusate sodium (COLACE) 100 MG capsule Take 1 capsule (100 mg total) by mouth 2 (two) times daily. Patient not taking: Reported on 07/19/2017 06/29/17   Dustin Flock, MD  oxyCODONE-acetaminophen (PERCOCET/ROXICET) 5-325 MG tablet Take 1-2 tablets by mouth every 4 (four) hours as needed for severe pain. Patient not taking: Reported on 07/19/2017 06/29/17   Dustin Flock, MD  polyethylene glycol [facility] / Floria Raveling) packet Take 17 g by mouth daily as needed for mild constipation. Patient not taking: Reported on 07/19/2017 06/29/17   Dustin Flock, MD   No Known Allergies  FAMILY HISTORY:  Family History  Problem Relation Age of Onset  . Alcohol abuse Father   . Arthritis Mother   . Heart disease Maternal Grandmother   . Prostate cancer Brother    SOCIAL HISTORY:  reports that he has been smoking.  He has a 60.00 pack-year smoking history. He has never used smokeless tobacco. He reports that he does not drink alcohol or use drugs.  REVIEW OF SYSTEMS:   Patient cannot provide history due to confusion/delirium.   VITAL SIGNS: Temp:  [98.8 F (37.1 C)] 98.8 F (37.1 C) (08/11 2122) Pulse Rate:  [60-71] 71 (08/12 1500) Resp:  [22-24] 24 (08/12 1500) BP: (125-144)/(67-89) 125/89 (08/12 1500) SpO2:  [87 %-96 %] 96 % (08/12 1500) FiO2 (%):  [55 %] 55 % (08/12 1420) HEMODYNAMICS:   VENTILATOR SETTINGS: FiO2 (%):  [55 %] 55 % INTAKE /  OUTPUT:  Intake/Output Summary (Last 24 hours) at 07/23/17 1542 Last data filed at 07/23/17 0800  Gross per 24 hour  Intake            600.2 ml  Output                0 ml  Net            600.2 ml    Physical Examination:   VS: BP 125/89   Pulse 71   Temp 98.8 F (37.1 C) (Oral)   Resp (!) 24   Ht 5\' 9"  (1.753 m)   Wt 205 lb 11.2 oz (93.3 kg)   SpO2 96%   BMI 30.38 kg/m   General Appearance: No distress  Neuro:without focal findings, mental status, Confused HEENT: PERRLA, EOM intact, no ptosis, no other lesions noticed;  Pulmonary: Decreased air entry bilaterally. diaphragmatic excursion normal. CardiovascularNormal S1,S2.  No m/r/g.    Abdomen: Benign, Soft, non-tender, No masses, hepatosplenomegaly, No lymphadenopathy Renal:  No costovertebral tenderness  GU:  Not performed at this time. Endoc: No evident thyromegaly, no signs of acromegaly. Skin:   warm, no rashes, no ecchymosis  Extremities: normal, no cyanosis, clubbing, no edema, warm with normal capillary refill.    LABS: Reviewed   LABORATORY PANEL:   CBC  Recent Labs Lab 07/23/17 0727  WBC 15.3*  HGB 14.0  HCT 41.7  PLT 172    Chemistries   Recent Labs Lab 07/19/17 1734  07/21/17 0503  07/23/17 0727  NA 139  < > 140  < > 138  K 3.4*  < > 3.0*  < > 3.2*  CL 98*  < > 99*  < > 101  CO2 30  < > 31  < > 26  GLUCOSE 102*  < > 93  < > 118*  BUN 15  < > 13  < > 11  CREATININE 1.31*  < > 1.15  < > 0.92  CALCIUM 9.4  < > 8.4*  < > 8.3*  MG  --   --  1.9  --   --   AST 23  --   --   --   --   ALT 11*  --   --   --   --   ALKPHOS 94  --   --   --   --   BILITOT 1.7*  --   --   --   --   < > = values in this interval not displayed.  No results for input(s): GLUCAP in the last 168 hours.  Recent Labs Lab 07/23/17 1336  PHART 7.57*  PCO2ART  30*  PO2ART 47*    Recent Labs Lab 07/19/17 1734  AST 23  ALT 11*  ALKPHOS 94  BILITOT 1.7*  ALBUMIN 3.6    Cardiac Enzymes  Recent  Labs Lab 07/19/17 1802  TROPONINI 0.04*    RADIOLOGY:  Ct Angio Chest Pe W Or Wo Contrast  Result Date: 07/22/2017 CLINICAL DATA:  Increasing shortness of breath and chest pain EXAM: CT ANGIOGRAPHY CHEST WITH CONTRAST TECHNIQUE: Multidetector CT imaging of the chest was performed using the standard protocol during bolus administration of intravenous contrast. Multiplanar CT image reconstructions and MIPs were obtained to evaluate the vascular anatomy. CONTRAST:  75 mL Isovue 370. COMPARISON:  None. FINDINGS: Cardiovascular: Aortic atherosclerotic changes noted without aneurysmal dilatation. Pulmonary artery demonstrates bilateral pulmonary emboli is slightly greater on the right than the left. Mild right ventricular dilatation is noted consistent with right heart strain. Mediastinum/Nodes: Thoracic inlet is within normal limits. Few scattered small mediastinal lymph nodes are noted. No significant hilar adenopathy is seen. The esophagus is within normal limits. Lungs/Pleura: Diffuse emphysematous changes are identified. Bilateral lower lobe consolidation is noted as well as small right pleural effusion. Patchy upper lobe infiltrative changes are noted on the right. Upper Abdomen: Upper abdomen is within normal limits. Musculoskeletal: Degenerative change of thoracic spine is seen. No compression deformities are noted. Review of the MIP images confirms the above findings. IMPRESSION: Positive for acute PE with CT evidence of right heart strain (RV/LV Ratio = 0.96) consistent with at least submassive (intermediate risk) PE. The presence of right heart strain has been associated with an increased risk of morbidity and mortality. Bilateral lower lobe consolidation with evidence of small right pleural effusion. Patchy bilateral upper lobe infiltrates. Aortic Atherosclerosis (ICD10-I70.0) and Emphysema (ICD10-J43.9). These results will be called to the ordering clinician or representative by the Radiologist  Assistant, and communication documented in the PACS or zVision Dashboard. Electronically Signed   By: Inez Catalina M.D.   On: 07/22/2017 14:12       --Deep Ashby Dawes, MD.  Board Certified in Internal Medicine, Pulmonary Medicine, [place], and Sleep Medicine.  ICU Pager [phone] [facility] Office Number: [phone]  Patricia Pesa, M.D.  Merton Border, M.D   07/23/2017, 3:42 PM  Critical Care Attestation.  I have personally obtained a history, examined the patient, evaluated laboratory and imaging results, formulated the assessment and plan and placed orders. The Patient requires high complexity decision making for assessment and support, frequent evaluation and titration of therapies, application of advanced monitoring technologies and extensive interpretation of multiple databases. The patient has critical illness that could lead imminently to failure of 1 or more organ systems and requires the highest level of physician preparedness to intervene.  Critical Care Time devoted to patient care services described in this note is 45 minutes and is exclusive of time spent in procedures supervisory time of NP.

## 2017-07-23 NOTE — Progress Notes (Signed)
Orders placed for continuous pulse oxygen. Pt noted with HR change from brady to a-fib on monitor. MD notified. Amiodarone was held this AM due to QTC prolongation. MD aware, continue to monitor.

## 2017-07-23 NOTE — Progress Notes (Signed)
Pharmacy Antibiotic Note  Ricardo Erickson is a 76 y.o. male admitted on 07/19/2017 with possible pneumonia and PE. Patient has been receiving Zosyn since 8/11, and received ceftriaxone/azithromycin from 8/8-8/11.     Pharmacy now consulted for vancomycin dosing.  Plan: Ke: 0.070   T1/2: 9.9  Vd: 65.3  Will start patient on Vancomycin 1250mg  IV every 12 hours with 6 hours stack dosing. Calculated trough at Css is 16. Trough ordered prior to 4th dose.   Will monitor renal function and adjust dose as needed. Recommend a MRSA PCR- if negative recommend discontinuation of vancomycin.  Height: 5\' 9"  (175.3 cm) Weight: 205 lb 11.2 oz (93.3 kg) IBW/kg (Calculated) : 70.7  Temp (24hrs), Avg:98.8 F (37.1 C), Min:98.8 F (37.1 C), Max:98.8 F (37.1 C)   Recent Labs Lab 07/19/17 1734 07/20/17 0357 07/21/17 0503 07/22/17 0358 07/23/17 0727  WBC 18.4* 15.6* 15.3* 14.7* 15.3*  CREATININE 1.31* 1.19 1.15 0.99 0.92    Estimated Creatinine Clearance: 79.4 mL/min (by C-G formula based on SCr of 0.92 mg/dL).    No Known Allergies  Antimicrobials this admission: 8/8 Cefriaxone/Azithromycin >>8/11 8/11 Zosyn >> 8/12 vancomycin  >>   Dose adjustments this admission:  Microbiology results: 8/8  UCx: Multiple species present   Thank you for allowing pharmacy to be a part of this patient's care.  Pernell Dupre, PharmD, BCPS Clinical Pharmacist 07/23/2017 11:22 AM

## 2017-07-23 NOTE — Progress Notes (Signed)
Pt with decreased O2 sats,  Pt currently on 5L via [place]. Pt very restless, agitated, pulling at iv lines and tele monitor.MD paged.

## 2017-07-24 ENCOUNTER — Inpatient Hospital Stay: Payer: Medicare Other

## 2017-07-24 ENCOUNTER — Encounter: Payer: Self-pay | Admitting: Psychiatry

## 2017-07-24 DIAGNOSIS — F17200 Nicotine dependence, unspecified, uncomplicated: Secondary | ICD-10-CM | POA: Diagnosis present

## 2017-07-24 DIAGNOSIS — G9340 Encephalopathy, unspecified: Secondary | ICD-10-CM | POA: Diagnosis present

## 2017-07-24 DIAGNOSIS — I2699 Other pulmonary embolism without acute cor pulmonale: Secondary | ICD-10-CM

## 2017-07-24 DIAGNOSIS — I2690 Septic pulmonary embolism without acute cor pulmonale: Secondary | ICD-10-CM

## 2017-07-24 DIAGNOSIS — J449 Chronic obstructive pulmonary disease, unspecified: Secondary | ICD-10-CM

## 2017-07-24 DIAGNOSIS — Z7189 Other specified counseling: Secondary | ICD-10-CM

## 2017-07-24 DIAGNOSIS — Z515 Encounter for palliative care: Secondary | ICD-10-CM

## 2017-07-24 LAB — BASIC METABOLIC PANEL
Anion gap: 10 (ref 5–15)
BUN: 11 mg/dL (ref 6–20)
CO2: 25 mmol/L (ref 22–32)
CREATININE: 0.83 mg/dL (ref 0.61–1.24)
Calcium: 8.4 mg/dL — ABNORMAL LOW (ref 8.9–10.3)
Chloride: 103 mmol/L (ref 101–111)
Glucose, Bld: 97 mg/dL (ref 65–99)
Potassium: 3.3 mmol/L — ABNORMAL LOW (ref 3.5–5.1)
SODIUM: 138 mmol/L (ref 135–145)

## 2017-07-24 LAB — CBC
HCT: 42.6 % (ref 40.0–52.0)
HEMOGLOBIN: 14.4 g/dL (ref 13.0–18.0)
MCH: 30.1 pg (ref 26.0–34.0)
MCHC: 33.8 g/dL (ref 32.0–36.0)
MCV: 89.1 fL (ref 80.0–100.0)
PLATELETS: 180 10*3/uL (ref 150–440)
RBC: 4.78 MIL/uL (ref 4.40–5.90)
RDW: 14.2 % (ref 11.5–14.5)
WBC: 14.6 10*3/uL — ABNORMAL HIGH (ref 3.8–10.6)

## 2017-07-24 LAB — PROCALCITONIN: PROCALCITONIN: 0.16 ng/mL

## 2017-07-24 LAB — HEPARIN LEVEL (UNFRACTIONATED): HEPARIN UNFRACTIONATED: 0.41 [IU]/mL (ref 0.30–0.70)

## 2017-07-24 LAB — MAGNESIUM: Magnesium: 1.9 mg/dL (ref 1.7–2.4)

## 2017-07-24 LAB — GLUCOSE, CAPILLARY: Glucose-Capillary: 114 mg/dL — ABNORMAL HIGH (ref 65–99)

## 2017-07-24 LAB — PHOSPHORUS: PHOSPHORUS: 2.4 mg/dL — AB (ref 2.5–4.6)

## 2017-07-24 LAB — MRSA PCR SCREENING: MRSA BY PCR: NEGATIVE

## 2017-07-24 MED ORDER — DEXTROSE 5 % IV SOLN
1.0000 g | INTRAVENOUS | Status: DC
  Administered 2017-07-24 – 2017-07-25 (×2): 1 g via INTRAVENOUS
  Filled 2017-07-24 (×5): qty 10

## 2017-07-24 MED ORDER — HALOPERIDOL 0.5 MG PO TABS
0.5000 mg | ORAL_TABLET | Freq: Three times a day (TID) | ORAL | Status: DC
  Filled 2017-07-24 (×2): qty 1

## 2017-07-24 MED ORDER — POTASSIUM CHLORIDE CRYS ER 20 MEQ PO TBCR
40.0000 meq | EXTENDED_RELEASE_TABLET | Freq: Once | ORAL | Status: AC
  Administered 2017-07-24: 40 meq via ORAL
  Filled 2017-07-24: qty 2

## 2017-07-24 MED ORDER — QUETIAPINE FUMARATE 25 MG PO TABS
25.0000 mg | ORAL_TABLET | Freq: Three times a day (TID) | ORAL | Status: DC
  Administered 2017-07-24 – 2017-07-27 (×8): 25 mg via ORAL
  Filled 2017-07-24 (×9): qty 1

## 2017-07-24 MED ORDER — QUETIAPINE FUMARATE 25 MG PO TABS: 25.0000 mg | ORAL_TABLET | Freq: Every day | ORAL | Status: DC

## 2017-07-24 MED ORDER — POTASSIUM CHLORIDE 20 MEQ PO PACK
40.0000 meq | PACK | Freq: Once | ORAL | Status: DC
  Filled 2017-07-24: qty 2

## 2017-07-24 MED ORDER — HALOPERIDOL LACTATE 5 MG/ML IJ SOLN
2.0000 mg | Freq: Four times a day (QID) | INTRAMUSCULAR | Status: DC | PRN
  Administered 2017-07-24: 3 mg via INTRAVENOUS
  Administered 2017-07-24 – 2017-07-26 (×2): 2 mg via INTRAVENOUS
  Filled 2017-07-24 (×3): qty 1

## 2017-07-24 MED ORDER — POTASSIUM CHLORIDE 10 MEQ/100ML IV SOLN
10.0000 meq | INTRAVENOUS | Status: AC
  Administered 2017-07-24 (×4): 10 meq via INTRAVENOUS
  Filled 2017-07-24 (×4): qty 100

## 2017-07-24 MED ORDER — APIXABAN 5 MG PO TABS
10.0000 mg | ORAL_TABLET | Freq: Two times a day (BID) | ORAL | Status: DC
  Administered 2017-07-24 – 2017-07-28 (×9): 10 mg via ORAL
  Filled 2017-07-24 (×9): qty 2

## 2017-07-24 MED ORDER — QUETIAPINE FUMARATE 25 MG PO TABS
25.0000 mg | ORAL_TABLET | Freq: Once | ORAL | Status: AC
  Administered 2017-07-24: 25 mg via ORAL
  Filled 2017-07-24: qty 1

## 2017-07-24 MED ORDER — APIXABAN 5 MG PO TABS: 5.0000 mg | ORAL_TABLET | Freq: Two times a day (BID) | ORAL | Status: DC

## 2017-07-24 NOTE — Progress Notes (Signed)
SNF and LTACH Benefits:  Number called: [phone] Rep: MJ Reference Number: [id number]  [facility] Medicare HMO Enhanced plan active as of 12/12/16 with no deductible.  Out of pocket max is $5500, of which $2093.27 met so far.  In-network SNF: $0 copay for days 1-20, a $167.50 daily copay for days 21-60, and a $0 copay for days 61-100.  Once out of pocket is reached, patient covered at 100% for remainder of 100 day benefit period. Responsible for 100% of charges for days 101+.  Per rep, all 100 SNF days available per their records.  Josem Kaufmann is required through [place]: [phone].     LTACH benefits:  Rep quoted inpatient admission to acute care hospital as closet comparable benefit for [facility].  $300 copay per day for days 1-6 with no copay or co-insurance for days 7+ for qualifying Medicare covered admissions.  Encouraged to call and speak with Care Management team at [phone] opt 6 to begin auth process to determine if patient will be covered for [facility] services.

## 2017-07-24 NOTE — Progress Notes (Signed)
PT Cancellation Note  Patient Details Name: Ricardo Erickson MRN: [id number] DOB: [birth year]   Cancelled Treatment:    Reason Eval/Treat Not Completed: Patient not medically ready.  Pt transferred to CCU 07/23/17 (rapid response called 07/23/17; pt transferred with acute hypoxic respiratory failure secondary to PNA and acute PE that was found on CT angio chest 07/22/17).  D/t pt transferring to higher level of care, per PT protocol require new PT consult in order to continue therapy (will discontinue current PT order d/t this).  Also of note, heparin drip started 07/22/17 at 1605. Per therapy protocol, need to wait 48 hours from start time of heparin drip for acute PE (can see pt after 07/24/17 at 1605).  Please re-consult PT when pt is medically appropriate to participate in PT.  Leitha Bleak, PT 07/24/17, 1:24 PM [phone]

## 2017-07-24 NOTE — Care Management (Signed)
RNCM has asked both Ricardo Erickson with [facility] and Erika with [facility] to review/screen chart for LTAC potential. Patient has two admits over last 6 months after going to SNF and now back in ICU for respiratory problems. RNCM will continue to follow.

## 2017-07-24 NOTE — Progress Notes (Addendum)
Patient transferred to the ICU from 1A. Plan is for patient to D/C to [facility] for short term rehab. A new Blue Medicare authorization will need to be started after patient transfers out from the ICU. PASARR is pending. [facility] admissions coordinator at [facility] is aware of above.   CSW sent requested clinicals to [facility] today.   [employer], LCSW [phone]

## 2017-07-24 NOTE — Progress Notes (Signed)
Nutrition Follow-up  DOCUMENTATION CODES:   Obesity unspecified  INTERVENTION:  Recommend downgrading diet to dysphagia 2 with thin liquids.  Continue Ensure Enlive po BID, each supplement provides 350 kcal and 20 grams of protein.  Provide Magic cup TID with meals, each supplement provides 290 kcal and 9 grams of protein.  NUTRITION DIAGNOSIS:   Inadequate oral intake related to poor appetite, chronic illness as evidenced by per patient/family report, percent weight loss.  Ongoing.  GOAL:   Patient will meet greater than or equal to 90% of their needs  Progressing - patient currently meeting approximately 45% of his needs.  MONITOR:   PO intake, I & O's, Labs, Weight trends, Supplement acceptance  REASON FOR ASSESSMENT:   Malnutrition Screening Tool    ASSESSMENT:   Ricardo Erickson has a PMHHx of emphysema, HTN, PTSD, chronic back pain s/p kyphoplasty 06/2017 after compression fracture. Presents with UTI, AMS, weakness. Patient was at rehab facility but left early.  -Rapid response was called afternoon of 8/12 as pt was restless, anxious, and tachypneic. Patient transferred to ICU stepdown. -Patient found to have pulmonary emboli. -Patient being followed by PMT. Also seen today by Psychiatry in setting of delirium.  Spoke with patient at bedside. He is not a reliable historian. He does report accurately that his appetite is "horrible." He reports it has been poor during this admission. He reports he was eating well PTA. He believes it is because he likes his food "pure" and "how it should taste." He does not like the spices added to foods here. Patient is reporting he has had 5-6 of the [employer] donuts brought in by family (however per RN he only has had one today). He reports he enjoys sweets and ice cream. He is amenable to eating Magic Cup with meals. Noted during assessment patient was edentulous. He reports his dentures are lost.   Meal Completion: not recorded past  24 hrs but was previously 0-50%. Per RN patient had 1 [employer] donut for breakfast this morning. Patient has had both bottles of Ensure today. So far today patient has had at least 890 kcal (45% minimum estimated kcal needs) and 43 grams of protein (45% minimum estimated protein needs). Unable to assess true intake over 24 hrs as patient cannot remember what he had for dinner last night or if he had any lunch today.  Medications reviewed and include: famotidine, Lasix 20 mg daily, Haldol TID, potassium chloride 40 mEq once today, senna, ceftriaxone.  Labs reviewed: Potassium 3.3, Phosphorus 2.4.  No subsequent weight to trend since initial assessment.  Discussed with RN. Patient also discussed on rounds this AM.  Diet Order:  Diet regular Room service appropriate? Yes; Fluid consistency: Thin  Skin:  Wound (see comment) (abrasion right arm, closed incision back)  Last BM:  07/24/2017 - type 6  Height:   Ht Readings from Last 1 Encounters:  07/20/17 5\' 9"  (1.753 m)    Weight:   Wt Readings from Last 1 Encounters:  07/20/17 205 lb 11.2 oz (93.3 kg)    Ideal Body Weight:  72.72 kg  BMI:  Body mass index is 30.38 kg/m.  Estimated Nutritional Needs:   Kcal:  2000-2170 (MSJ x 1.2-1.3)  Protein:  95-112 grams (1-1.2 grams/kg)  Fluid:  1.8-2.2 L/day (25-30 ml/kg IBW)  EDUCATION NEEDS:   No education needs identified at this time  Willey Blade, MS, RD, LDN Pager: [phone] After Hours Pager: [phone]

## 2017-07-24 NOTE — Progress Notes (Signed)
Ricardo Erickson received a PG from ICU to come and talk with PT. Ricardo Erickson reported to [place] and spent time listing to PT share his fears with death. Also his fears of sleeping because he has bad dreams. PT gave encouragement and prayed for PT. [facility] will follow up on PT.   07/24/17 1525  Clinical Encounter Type  Visited With Patient  Visit Type Initial  Referral From Nurse  Consult/Referral To Chaplain  Spiritual Encounters  Spiritual Needs Prayer;Emotional

## 2017-07-24 NOTE — Progress Notes (Signed)
OT Cancellation Note  Patient Details Name: Ricardo Erickson MRN: [id number] DOB: March 30, 1941   Cancelled Treatment:    Reason Eval/Treat Not Completed: Patient not medically ready. Chart reviewed this morning. Rapid response called on 8/12 and pt transferred to ICU with acute hypoxic respiratory failure secondary to PNA and acute PE found on CT angio chest done 8/11. Heparin drip started 8/11 at 4:05pm. Per therapy protocol, will need new OT orders due to change in pt status. Must wait 48 hours from the start time of heparin drip for acute PE (can see after 8/13 4:05pm). Will await new OT orders and re-evaluate as medically appropriate.  Jeni Salles, MPH, MS, OTR/L ascom [phone] 07/24/17, 8:23 AM

## 2017-07-24 NOTE — Progress Notes (Signed)
[facility] Consultation    SYNOPSIS   The patient is a 76 year old male, history of COPD, severe anxiety, status post kyphoplasty with declining course. Admitted with UTI, now transferred to ICU after CT shows pulmonary embolism with bilateral pneumonia.  ASSESSMENT/PLAN    PULMONARY A:Acute on chronic respiratory failure, likely multifactorial from pulmonary emboli, bibasilar atelectasis with bibasilar pneumonia, as well as possible pulmonary infarction. On high flow [place] -DO NOT RESUSCITATE. P:   -Continue IV heparin infusion. -Continue broad-spectrum antibiotics. -Continue nebulizers. fio2 40%   VENTILATOR SETTINGS: FiO2 (%):  [40 %-55 %] 40 %  CARDIOVASCULAR A: Atrial fibrillation, currently rate controlled. P:  -Continue oral amiodarone.  INTAKE / OUTPUT:  Intake/Output Summary (Last 24 hours) at 07/24/17 0718 Last data filed at 07/24/17 0700  Gross per 24 hour  Intake           866.55 ml  Output                0 ml  Net           866.55 ml    GASTROINTESTINAL Famotidine.   HEMATOLOGIC A:  Leukocytosis, likely reactive. P:  Continue to monitor.  INFECTIOUS A:  Pneumonia. P:   Continue empiric antibiotics. Objective procalcitonin.  Micro/culture results:  BCx2 Pending UC --8/8 Multiple species present Sputum--  Antibiotics: 8/8 Cefriaxone/Azithromycin >>8/11 8/11 Zosyn >> 8/12 vancomycin  >>   ENDOCRINE A:   P:   Monitor blood glucose.   NEUROLOGIC A:  Acute delirium. Severe anxiety.   P:   -Continue baseline anxiolytics. -Continue p.m. Risperdal and haldol -May need to add Precedex.   MAJOR EVENTS/TEST RESULTS: -Admitted 06/24/2017 s/p kyphoplasty 4/76, Complicated by A. fib with RVR, severe delirium with agitation necessitating ICU Transfer and initiation of a Precedex drip. Discharged on 7/19. -  Best Practices  DVT Prophylaxis: IV heparin GI Prophylaxis: Famotidine.    --------------------------------------- Discussed CODE STATUS with family at bedside, explained that patient may get worse before he gets better. I confirmed with family the patient's DO NOT RESUSCITATE status, including no intubation.  ---------------------------------------   Name: QUENTEZ LOBER MRN: [id number] DOB: 30-Jul-1941    ADMISSION DATE:  07/19/2017 CONSULTATION DATE:  07/23/2017  REFERRING MD :  Dr. Posey Pronto  CHIEF COMPLAINT:  Dyspnea   HISTORY OF PRESENT ILLNESS:   The patient is a 76 year old male recently discharged on 06/27/2017 after a kyphoplasty. That admission was completed by A. fib with RVR, and severe delirium and agitation requiring ICU transfer, the patient was started on Precedex. He was discharged to rehabilitation. However, since that time, he did become progressively weaker, his course has declined according to his family who would provide history at the bedside. Currently the patient is fairly confused and cannot provide a coherent history. Family notes that he is been getting progressively more confused since being discharged. He was admitted on 07/19/2017 and started on treatment for a UTI.  He has a history of COPD, however, his breathing had grown worse, he was sent for CT chest yesterday. I personally reviewed, images. It was consistent with chronic emphysema, comp, complicated by bibasilar atelectasis and pneumonia, as well as bilateral segmental pulmonary emboli with possible pulmonary infarct.  SUBJECTIVE Confused with intermittent agitation. Given haldol with good effect. No other issues overnight  VITAL SIGNS: Temp:  [98.6 F (37 C)-98.8 F (37.1 C)] 98.6 F (37 C) (08/13 0200) Pulse Rate:  [69-115] 86 (08/13 0700) Resp:  [15-33] 19 (08/13 0700) BP: (  102-191)/(60-101) 191/85 (08/13 0700) SpO2:  [84 %-96 %] 88 % (08/13 0700) FiO2 (%):  [40 %-55 %] 40 % (08/12 1535) HEMODYNAMICS:   VENTILATOR SETTINGS: FiO2 (%):  [40 %-55 %] 40 % INTAKE /  OUTPUT:  Intake/Output Summary (Last 24 hours) at 07/24/17 0718 Last data filed at 07/24/17 0700  Gross per 24 hour  Intake           866.55 ml  Output                0 ml  Net           866.55 ml    Physical Examination:   VS: BP (!) 191/85   Pulse 86   Temp 98.6 F (37 C) (Axillary)   Resp 19   Ht 5\' 9"  (1.753 m)   Wt 205 lb 11.2 oz (93.3 kg)   SpO2 (!) 88%   BMI 30.38 kg/m   General Appearance: No distress  Neuro:without focal findings, mental status, Confused HEENT: PERRLA, EOM intact, no ptosis, no other lesions noticed;  Pulmonary: Decreased air entry bilaterally. diaphragmatic excursion normal. CardiovascularNormal S1,S2.  No m/r/g.    Abdomen: Benign, Soft, non-tender, No masses, hepatosplenomegaly, No lymphadenopathy Renal:  No costovertebral tenderness  GU:  Not performed at this time. Endoc: No evident thyromegaly, no signs of acromegaly. Skin:   warm, no rashes, no ecchymosis  Extremities: normal, no cyanosis, clubbing, no edema, warm with normal capillary refill.    LABS: Reviewed   LABORATORY PANEL:   CBC  Recent Labs Lab 07/24/17 0346  WBC 14.6*  HGB 14.4  HCT 42.6  PLT 180    Chemistries   Recent Labs Lab 07/19/17 1734  07/21/17 0503  07/24/17 0346  NA 139  < > 140  < > 138  K 3.4*  < > 3.0*  < > 3.3*  CL 98*  < > 99*  < > 103  CO2 30  < > 31  < > 25  GLUCOSE 102*  < > 93  < > 97  BUN 15  < > 13  < > 11  CREATININE 1.31*  < > 1.15  < > 0.83  CALCIUM 9.4  < > 8.4*  < > 8.4*  MG  --   --  1.9  --   --   AST 23  --   --   --   --   ALT 11*  --   --   --   --   ALKPHOS 94  --   --   --   --   BILITOT 1.7*  --   --   --   --   < > = values in this interval not displayed.  No results for input(s): GLUCAP in the last 168 hours.  Recent Labs Lab 07/23/17 1336  PHART 7.57*  PCO2ART 30*  PO2ART 47*    Recent Labs Lab 07/19/17 1734  AST 23  ALT 11*  ALKPHOS 94  BILITOT 1.7*  ALBUMIN 3.6    Cardiac Enzymes  Recent  Labs Lab 07/19/17 1802  TROPONINI 0.04*    RADIOLOGY:  Ct Angio Chest Pe W Or Wo Contrast  Result Date: 07/22/2017 CLINICAL DATA:  Increasing shortness of breath and chest pain EXAM: CT ANGIOGRAPHY CHEST WITH CONTRAST TECHNIQUE: Multidetector CT imaging of the chest was performed using the standard protocol during bolus administration of intravenous contrast. Multiplanar CT image reconstructions and MIPs were obtained to evaluate the vascular anatomy.  CONTRAST:  75 mL Isovue 370. COMPARISON:  None. FINDINGS: Cardiovascular: Aortic atherosclerotic changes noted without aneurysmal dilatation. Pulmonary artery demonstrates bilateral pulmonary emboli is slightly greater on the right than the left. Mild right ventricular dilatation is noted consistent with right heart strain. Mediastinum/Nodes: Thoracic inlet is within normal limits. Few scattered small mediastinal lymph nodes are noted. No significant hilar adenopathy is seen. The esophagus is within normal limits. Lungs/Pleura: Diffuse emphysematous changes are identified. Bilateral lower lobe consolidation is noted as well as small right pleural effusion. Patchy upper lobe infiltrative changes are noted on the right. Upper Abdomen: Upper abdomen is within normal limits. Musculoskeletal: Degenerative change of thoracic spine is seen. No compression deformities are noted. Review of the MIP images confirms the above findings. IMPRESSION: Positive for acute PE with CT evidence of right heart strain (RV/LV Ratio = 0.96) consistent with at least submassive (intermediate risk) PE. The presence of right heart strain has been associated with an increased risk of morbidity and mortality. Bilateral lower lobe consolidation with evidence of small right pleural effusion. Patchy bilateral upper lobe infiltrates. Aortic Atherosclerosis (ICD10-I70.0) and Emphysema (ICD10-J43.9). These results will be called to the ordering clinician or representative by the Radiologist  Assistant, and communication documented in the PACS or zVision Dashboard. Electronically Signed   By: Inez Catalina M.D.   On: 07/22/2017 14:12     [facility] ANP-BC Pulmonary and [place] Pager [phone] or [phone]   STAFF NOTE: I, Dr. Corrin Parker,  have personally reviewed patient's available data, including medical history, events of note, physical examination and test results as part of my evaluation. I have discussed with NP and other care providers such as pharmacist, RN and RRT.  In addition,  I personally evaluated patient and elicited key findings   Acute PE with hypoxia in the setting pneumonia, COPD with UTI Continue IV abx Continue oxygen therapy as needed keep o2 stats 88-92% Continue anticoagulation Patient DNR/DNI Palliative care consult appreciated      DSKAJG Patricia Pesa, M.D.  [facility]  Medical Director [facility] Cardio-Pulmonary Department

## 2017-07-24 NOTE — Progress Notes (Signed)
[place] [facility] [place] NAME: Ricardo Erickson    MR#:  [id number]  DATE OF BIRTH:  06/21/1941  SUBJECTIVE:  CHIEF COMPLAINT:   Chief Complaint  Patient presents with  . Altered Mental Status  . Hallucinations   Currently confused on high flow oxygen     REVIEW OF SYSTEMS:  Review of Systems  Unable to perform ROS: Mental status change   DRUG ALLERGIES:  No Known Allergies VITALS:  Blood pressure 124/66, pulse 72, temperature 99.9 F (37.7 C), temperature source Oral, resp. rate (!) 21, height 5\' 9"  (1.753 m), weight 205 lb 11.2 oz (93.3 kg), SpO2 94 %. PHYSICAL EXAMINATION:  Physical Exam  Constitutional: He appears distressed.  HENT:  Head: Normocephalic and atraumatic.  Eyes: Pupils are equal, round, and reactive to light. Conjunctivae are normal.  Neck: Normal range of motion. Neck supple. No tracheal deviation present. No thyromegaly present.  Cardiovascular: Normal rate, regular rhythm and normal heart sounds.   Pulmonary/Chest: Accessory muscle usage present. He is in respiratory distress. He has no decreased breath sounds. He has no wheezes. He has rhonchi in the right upper field, the right middle field, the right lower field, the left upper field, the left middle field and the left lower field. He exhibits no tenderness.  Abdominal: Soft. Bowel sounds are normal. He exhibits no distension. There is no tenderness.  Musculoskeletal: Normal range of motion.  Neurological: He is disoriented. No cranial nerve deficit.  Skin: Skin is warm and dry. No rash noted.  Psychiatric: Mood and affect normal.   LABORATORY PANEL:  Male CBC  Recent Labs Lab 07/24/17 0346  WBC 14.6*  HGB 14.4  HCT 42.6  PLT 180   ------------------------------------------------------------------------------------------------------------------ Chemistries   Recent Labs Lab 07/19/17 1734  07/24/17 0346  NA 139  < > 138  K 3.4*  < > 3.3*  CL 98*   < > 103  CO2 30  < > 25  GLUCOSE 102*  < > 97  BUN 15  < > 11  CREATININE 1.31*  < > 0.83  CALCIUM 9.4  < > 8.4*  MG  --   < > 1.9  AST 23  --   --   ALT 11*  --   --   ALKPHOS 94  --   --   BILITOT 1.7*  --   --   < > = values in this interval not displayed. RADIOLOGY:  Dg Chest 1 View  Result Date: 07/24/2017 CLINICAL DATA:  COPD, now with dyspnea; acute pulmonary embolism, current smoker. EXAM: CHEST 1 VIEW COMPARISON:  Chest x-ray of July 21, 2017 and chest CT scan of July 22, 2017 FINDINGS: The lungs are adequately inflated. The interstitial markings have become more conspicuous bilaterally. The heart is top-normal in size. The pulmonary vascularity is not engorged. There is calcification in the wall of the aortic arch. No pleural effusion or pneumothorax is observed. IMPRESSION: Worsening of infiltrates bilaterally compatible with pneumonia superimposed upon chronic bronchitic changes. No definite pulmonary edema. Thoracic aortic atherosclerosis. Electronically Signed   By: David  [place]ique M.D.   On: 07/24/2017 07:30   ASSESSMENT AND PLAN:   *Acute hypoxic respiratory failure due to pneumonia and pulmonary embolism pneumonia-  Continue current antibitoics Bilateral submassive pulmonary embolism patient currently on a heparin drip   * UTI (urinary tract infection)  - Continue current antibiotics due to pneumonia  * Hypokalemia - bering replaced by pharmacy  *  COPD (chronic obstructive pulmonary disease) ([place]) - continue home meds Continue nebulizer therapy and inhalers as taking at home  * Constipation - continue stool softener   * Anxiety - continue BuSpar when necessary Xanax   * A-fib ([place]) - continue amiodarone, digoxin, Cardizem    * BPH (benign prostatic hyperplasia) - continue home medications     Physical therapy evaluation - recommends short-term rehabilitation, social worker working on placement  All the records are reviewed and case discussed with Care  Automotive engineer. Management plans discussed with the patient, family (son at bedside) and they are in agreement.  CODE STATUS: DNR  TOTAL TIME TAKING CARE OF THIS PATIENT: 32 minutes  More than 50% of the time was spent in counseling/coordination of care: YES  POSSIBLE D/C early next week, DEPENDING ON CLINICAL CONDITION.   Dustin Flock M.D on 07/24/2017 at 3:28 PM  Between 7am to 6pm - Pager - [phone]  After 6pm go to [email or web address] - Proofreader  [facility]  Office  [phone]  CC: Primary care physician; Rusty Aus, MD  Note: This dictation was prepared with Dragon dictation along with smaller phrase technology. Any transcriptional errors that result from this process are unintentional.

## 2017-07-24 NOTE — Progress Notes (Signed)
[facility] for apixaban dosing  Indication: Bilateral PE/DVT  Pharmacy consulted for apixaban dosing for 76 yo male ICU patient be treated for  PE/DVT with heparin drip.   Plan:   Will start apixaban 10mg  BID x 7 days followed by apixaban 5mg  BID. Nurse asked to stop heparin drip at initiation of apixaban.   No Known Allergies  Patient Measurements: Height: 5\' 9"  (175.3 cm) Weight: 205 lb 11.2 oz (93.3 kg) IBW/kg (Calculated) : 70.7  Vital Signs: Temp: 99.9 F (37.7 C) (08/13 1945) Temp Source: Axillary (08/13 1945) BP: 171/93 (08/13 1800) Pulse Rate: 92 (08/13 1800)  Labs:  Recent Labs  07/22/17 0358 07/22/17 1441  07/23/17 0727 07/23/17 1740 07/24/17 0346  HGB 14.4  --   --  14.0  --  14.4  HCT 43.4  --   --  41.7  --  42.6  PLT 150  --   --  172  --  180  APTT  --  34  --   --   --   --   LABPROT  --  14.6  --   --   --   --   INR  --  1.13  --   --   --   --   HEPARINUNFRC  --   --   < > 0.37 0.26* 0.41  CREATININE 0.99  --   --  0.92  --  0.83  < > = values in this interval not displayed.  Estimated Creatinine Clearance: 88 mL/min (by C-G formula based on SCr of 0.83 mg/dL).   Medical History: Past Medical History:  Diagnosis Date  . Chronic back pain   . Depression   . Emphysema lung ([place])   . Hypertension   . Personal history of tobacco use, presenting hazards to health 08/06/2015  . Pollen allergies   . PTSD (post-traumatic stress disorder)    Pharmacy will continue to monitor and adjust per consult.   Simpson,Michael L 07/24/2017,7:46 PM
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
## 2017-07-24 NOTE — Consult Note (Signed)
Consultation Note Date: 07/24/2017   Patient Name: Ricardo Erickson  DOB: 1941-05-27  MRN: [id number]  Age / Sex: 76 y.o., male  PCP: Rusty Aus, MD Referring Physician: Dustin Flock, MD  Reason for Consultation: Establishing goals of care  HPI/Patient Profile: 76 y.o. male  with past medical history of PTSD, COPD (tobacco abuse), recent kyphoplasty (7/17) who was admitted on 07/19/2017 with UTI.  After admission he developed respiratory failure and was found to have bilateral pneumonia, PE is significant clot burden and right heart strain.  2D echo shows an EF of 20 - 25%.  He has been having afib with RVR and is currently on high flow nasal canula.  Unfortunately all of this is further complicated by acute delirium and paranoia.   Clinical Assessment and Goals of Care:  I have reviewed medical records including EPIC notes, labs and imaging, received report from the bedside RN, assessed the patient and then met at the bedside along with his son and dtr in law  to discuss diagnosis prognosis, GOC, EOL wishes, disposition and options.  I introduced Palliative Medicine as specialized medical care for people living with serious illness. It focuses on providing relief from the symptoms and stress of a serious illness. The goal is to improve quality of life for both the patient and the family.  We discussed a brief life review of the patient. 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  He retired at age 7ish.  Since retirement he has remained very active involved with his family (particularly his great-grandson who is a 73 yo foot ball player), working in his workshop and Science writer estate. He has two children, 1son and 1dtr.  His wife died about 10 years ago.  He has lived alone since - but has a large supportive family locally.  He is Information systems manager and derives a lot of comfort from his  religion.   His son tells me that his independence is very important to him and he would not want to live as a dependent person.  Family states he has had panic attacks in the past but this is much more severe.  Further he has had a life long phobia of White Coats, MDs and Hospitals.  As far as functional and nutritional status completely independent.  The week before kyphoplasty (7/17) he was deep sea fishing at the beach.  Since the kyphoplasty his life has been difficult and his health as declined very rapidly.   We discussed their current illness and what it means in the larger context of their on-going co-morbidities.  We discussed his underlying heart failure, and respiratory failure - both made acutely worse by the large PE and delirium.  The family is aware that he is at high risk to decline and die. They are hopeful for improvement but appreciate very straight forward information.    Primary Decision Maker:  NEXT OF KIN Son and dtr are co-HCPOA    SUMMARY OF RECOMMENDATIONS  Psych consult requested by patient himself. Treat the treatable.  Family is hopeful for improvement but request straight forward information if he declines. He is very sensitive to pain medication and opioids but has been on xanax and percocet at [facility] recently.  Code Status/Advance Care Planning:  DNR   Symptom Management:   Pscyh consult requested. Would Zyprexa help?  Agree with Haldol PRN   Palliative Prophylaxis:   Delirium Protocol  Psycho-social/Spiritual:   Desire for further Chaplaincy support: yes  Prognosis:   To be determined.  Discharge Planning: [facility] for rehab with Palliative care service follow-up      Primary Diagnoses: Present on Admission: . A-fib ([place]) . BPH (benign prostatic hyperplasia) . COPD (chronic obstructive pulmonary disease) ([place]) . UTI (urinary tract infection) . Anxiety   I have reviewed the medical record, interviewed  the patient and family, and examined the patient. The following aspects are pertinent.  Past Medical History:  Diagnosis Date  . Chronic back pain   . Depression   . Emphysema lung ([place])   . Hypertension   . Personal history of tobacco use, presenting hazards to health 08/06/2015  . Pollen allergies   . PTSD (post-traumatic stress disorder)    Social History   Social History  . Marital status: Widowed    Spouse name: N/A  . Number of children: N/A  . Years of education: N/A   Social History Main Topics  . Smoking status: Current Every Day Smoker    Packs/day: 1.00    Years: 60.00  . Smokeless tobacco: Never Used  . Alcohol use No  . Drug use: No  . Sexual activity: Not Currently   Other Topics Concern  . None   Social History Narrative   Widower.   2 children, 3 grandchildren, 1 great grandchild.   Retired. Now works as a Gaffer.   Enjoys relaxing, spending time with family, watching basketball.      Family History  Problem Relation Age of Onset  . Alcohol abuse Father   . Arthritis Mother   . Heart disease Maternal Grandmother   . Prostate cancer Brother    Scheduled Meds: . amiodarone  200 mg Oral Daily  . aspirin EC  81 mg Oral Daily  . busPIRone  7.5 mg Oral BID  . digoxin  0.125 mg Oral Daily  . diltiazem  240 mg Oral Daily  . famotidine  20 mg Oral BID  . feeding supplement (ENSURE ENLIVE)  237 mL Oral BID BM  . furosemide  20 mg Oral Daily  . lidocaine  1 patch Transdermal Q24H  . mometasone-formoterol  2 puff Inhalation BID  . nicotine  21 mg Transdermal Daily  . pramipexole  1 mg Oral QHS  . risperiDONE  0.5 mg Oral QHS  . senna-docusate  2 tablet Oral BID  . tamsulosin  0.4 mg Oral Daily  . tiotropium  18 mcg Inhalation Daily   Continuous Infusions: . heparin 1,800 Units/hr (07/23/17 2151)  . piperacillin-tazobactam (ZOSYN)  IV 3.375 g (07/24/17 9371)  . vancomycin 1,250 mg (07/24/17 0908)   PRN Meds:.acetaminophen **OR** acetaminophen,  albuterol, ALPRAZolam, haloperidol lactate, ondansetron **OR** ondansetron (ZOFRAN) IV, traMADol No Known Allergies Review of Systems patient with delirium  Physical Exam  Well developed elderly male awake.  Cooperative but afraid.  He tells me people here are trying to hurt him.  Son at bedside. CV rrr resp on high flo.  No distress Abdomen  Soft, nt  Vital  Signs: BP (!) 191/85   Pulse 86   Temp 98.6 F (37 C) (Axillary)   Resp 19   Ht '5\' 9"'  (1.753 m)   Wt 93.3 kg (205 lb 11.2 oz)   SpO2 (!) 88%   BMI 30.38 kg/m  Pain Assessment: CPOT POSS *See Group Information*: 1-Acceptable,Awake and alert Pain Score: 0-No pain   SpO2: SpO2: (!) 88 % O2 Device:SpO2: (!) 88 % O2 Flow Rate: .O2 Flow Rate (L/min): 35 L/min  IO: Intake/output summary:  Intake/Output Summary (Last 24 hours) at 07/24/17 1001 Last data filed at 07/24/17 0700  Gross per 24 hour  Intake           746.55 ml  Output                0 ml  Net           746.55 ml    LBM: Last BM Date: 07/23/17 Baseline Weight: Weight: 98 kg (216 lb) Most recent weight: Weight: 93.3 kg (205 lb 11.2 oz)     Palliative Assessment/Data:   Flowsheet Rows     Most Recent Value  Intake Tab  Referral Department  Critical care  Unit at Time of Referral  ICU  Palliative Care Primary Diagnosis  Pulmonary  Date Notified  07/23/17  Palliative Care Type  New Palliative care  Reason for referral  Clarify Goals of Care  Date of Admission  07/19/17  Date first seen by Palliative Care  07/24/17  # of days Palliative referral response time  1 Day(s)  # of days IP prior to Palliative referral  4  Clinical Assessment  Palliative Performance Scale Score  40%  Other Max Last 24 Hours  8  Psychosocial & Spiritual Assessment  Palliative Care Outcomes  Patient/Family meeting held?  Yes  Who was at the meeting?  son and dtr in law  Palliative Care Outcomes  Clarified goals of care  Patient/Family wishes: Interventions discontinued/not  started   Mechanical Ventilation      Time In: 9:00 Time Out: 9:50 Time Total: 50 min. Greater than 50%  of this time was spent counseling and coordinating care related to the above assessment and plan.  Signed by: Florentina Jenny, PA-C Palliative Medicine Pager: [phone]  Please contact Palliative Medicine Team phone at [phone] for questions and concerns.  For individual provider: See Shea Evans

## 2017-07-24 NOTE — Progress Notes (Signed)
Patient has had problems with anxiety and agitation on and off this shift. Has been very impulsive and tries to get out of bed. Patient is also very difficult to redirect. Does not follow commands at times, becomes noncompliant. Has memory issues and is very confused. Medicated with po meds at bedtime for behavior/sleep that were ineffective. Refused Xanax x 1. Haldol 2 mg was ineffective, but an additional 3 was effective for a short time. Continue to monitor.

## 2017-07-25 DIAGNOSIS — G9340 Encephalopathy, unspecified: Secondary | ICD-10-CM

## 2017-07-25 LAB — BASIC METABOLIC PANEL
ANION GAP: 9 (ref 5–15)
BUN: 12 mg/dL (ref 6–20)
CALCIUM: 8.7 mg/dL — AB (ref 8.9–10.3)
CO2: 29 mmol/L (ref 22–32)
CREATININE: 1.01 mg/dL (ref 0.61–1.24)
Chloride: 106 mmol/L (ref 101–111)
GFR calc Af Amer: >60 mL/min (ref >60)
GLUCOSE: 109 mg/dL — AB (ref 65–99)
Potassium: 3.8 mmol/L (ref 3.5–5.1)
Sodium: 144 mmol/L (ref 135–145)

## 2017-07-25 LAB — PROCALCITONIN: PROCALCITONIN: 0.23 ng/mL

## 2017-07-25 NOTE — Progress Notes (Signed)
[facility] Consultation    SYNOPSIS   The patient is a 76 year old male, history of COPD, severe anxiety, status post kyphoplasty with declining course. Admitted with UTI, now transferred to ICU after CT shows pulmonary embolism with bilateral pneumonia.  ASSESSMENT/PLAN    PULMONARY A:Acute on chronic respiratory failure, likely multifactorial from pulmonary emboli, bibasilar atelectasis with bibasilar pneumonia, as well as possible pulmonary infarction.  -DO NOT RESUSCITATE. P:   -Continue IV heparin infusion. -Continue broad-spectrum antibiotics. -Continue nebulizers. On RA  CARDIOVASCULAR A: Atrial fibrillation, currently rate controlled. P:  -Continue oral amiodarone.  INTAKE / OUTPUT:  Intake/Output Summary (Last 24 hours) at 07/25/17 0858 Last data filed at 07/25/17 0400  Gross per 24 hour  Intake              839 ml  Output              200 ml  Net              639 ml    Antibiotics: 8/8 Cefriaxone/Azithromycin >>8/11 8/11 Zosyn >> 8/12 vancomycin  >>     NEUROLOGIC A:  Acute delirium. Severe anxiety.  Much improved Continue [place]  Best Practices  DVT Prophylaxis: IV heparin GI Prophylaxis: Famotidine.   --------------------------------------- Discussed CODE STATUS with family at bedside, explained that patient may get worse before he gets better. I confirmed with family the patient's DO NOT RESUSCITATE status, including no intubation.  --------------------------------------- OK to transfer to gen med floor  Name: Ricardo Erickson MRN: [id number] DOB: May 14, 1941    ADMISSION DATE:  07/19/2017 CONSULTATION DATE:  07/23/2017  REFERRING MD :  Dr. Posey Pronto  CHIEF COMPLAINT:  Dyspnea    SUBJECTIVE Confused with intermittent agitation. Given haldol with good effect. No other issues overnight Patient off of oxygen No resp distress noted  VITAL SIGNS: Temp:  [98.3 F (36.8 C)-99.9 F (37.7 C)] 98.3 F (36.8 C) (08/14  0800) Pulse Rate:  [72-121] 102 (08/14 0800) Resp:  [21-33] 32 (08/14 0800) BP: (111-171)/(53-96) 118/85 (08/14 0800) SpO2:  [86 %-99 %] 93 % (08/14 0800) FiO2 (%):  [40 %] 40 % (08/13 0900) HEMODYNAMICS:   VENTILATOR SETTINGS: FiO2 (%):  [40 %] 40 % INTAKE / OUTPUT:  Intake/Output Summary (Last 24 hours) at 07/25/17 0858 Last data filed at 07/25/17 0400  Gross per 24 hour  Intake              839 ml  Output              200 ml  Net              639 ml    Physical Examination:   VS: BP 118/85   Pulse (!) 102   Temp 98.3 F (36.8 C) (Oral)   Resp (!) 32   Ht 5\' 9"  (1.753 m)   Wt 205 lb 11.2 oz (93.3 kg)   SpO2 93%   BMI 30.38 kg/m   General Appearance: No distress  Neuro:without focal findings, mental status, Confused HEENT: PERRLA, EOM intact, no ptosis, no other lesions noticed;  Pulmonary: Decreased air entry bilaterally. diaphragmatic excursion normal. CardiovascularNormal S1,S2.  No m/r/g.      LABS: Reviewed   LABORATORY PANEL:   CBC  Recent Labs Lab 07/24/17 0346  WBC 14.6*  HGB 14.4  HCT 42.6  PLT 180    Chemistries   Recent Labs Lab 07/19/17 1734  07/24/17 0346 07/25/17 0353  NA 139  < >  138 144  K 3.4*  < > 3.3* 3.8  CL 98*  < > 103 106  CO2 30  < > 25 29  GLUCOSE 102*  < > 97 109*  BUN 15  < > 11 12  CREATININE 1.31*  < > 0.83 1.01  CALCIUM 9.4  < > 8.4* 8.7*  MG  --   < > 1.9  --   PHOS  --   --  2.4*  --   AST 23  --   --   --   ALT 11*  --   --   --   ALKPHOS 94  --   --   --   BILITOT 1.7*  --   --   --   < > = values in this interval not displayed.   Recent Labs Lab 07/23/17 1415  GLUCAP 114*    Recent Labs Lab 07/23/17 1336  PHART 7.57*  PCO2ART 30*  PO2ART 47*    Recent Labs Lab 07/19/17 1734  AST 23  ALT 11*  ALKPHOS 58  BILITOT 1.7*  ALBUMIN 3.6    Assessment and plan Acute PE with hypoxia in the setting pneumonia, COPD with UTI Continue IV abx Continue oxygen therapy as needed keep o2 stats  88-92% Continue anticoagulation Patient DNR/DNI Palliative care consult appreciated Anxiety and delirium much improved Okay to transfer to GEN med floor  Will sign off at this time   Corrin Parker, M.D.  [facility]  Medical Director [place] Director [facility] Cardio-Pulmonary Department

## 2017-07-25 NOTE — Progress Notes (Signed)
Daily Progress Note   Patient Name: Ricardo Erickson       Date: 07/25/2017 DOB: December 10, 1941  Age: 76 y.o. MRN#: [id number] Attending Physician: Dustin Flock, MD Primary Care Physician: Rusty Aus, MD Admit Date: 07/19/2017  Reason for Consultation/Follow-up: Establishing goals of care  Subjective: Patient reports he is feeling better.  He believes his cat is here at bedside - but then corrects himself and states he knows his cat is not here. He hugs me as asks me to get him out of the hospital.  With a smile he asks his son to get him out of the hospital.  I spoke with the son privately.  We discussed his improvement and on-going anticoagulation to hopefully prevent future clots.   We discussed chronic co-morbidities including COPD and Heart failure.  Son aware and reasonable.   He is most concerned about keeping his father's mental status as stable as possible.  I committed to check on him daily for support - and offer Palliative at discharge.   Assessment: 76 yo with bilat pneumonia and submassive PE as well as delirium.  Much improved.  Transferring to the floor.   Patient Profile/HPI: 76 y.o. male  with past medical history of PTSD, COPD (tobacco abuse), recent kyphoplasty (7/17) who was admitted on 07/19/2017 with UTI.  After admission he developed respiratory failure and was found to have bilateral pneumonia, PE is significant clot burden and right heart strain.  2D echo shows an EF of 20 - 25%.  He has been having afib with RVR and is currently on high flow nasal canula.  Unfortunately all of this is further complicated by acute delirium and paranoia.     Length of Stay: 6  Current Medications: Scheduled Meds:  . amiodarone  200 mg Oral Daily  . apixaban  10 mg Oral BID   Followed by  . [START ON 07/31/2017] apixaban  5 mg Oral BID  . aspirin EC  81 mg Oral Daily  . digoxin  0.125 mg Oral Daily  . diltiazem  240 mg Oral Daily  . famotidine  20 mg Oral BID  . feeding supplement (ENSURE ENLIVE)  237 mL Oral BID BM  . furosemide  20 mg Oral Daily  . lidocaine  1 patch Transdermal Q24H  . mometasone-formoterol  2 puff  Inhalation BID  . nicotine  21 mg Transdermal Daily  . pramipexole  1 mg Oral QHS  . QUEtiapine  25 mg Oral TID  . senna-docusate  2 tablet Oral BID  . tamsulosin  0.4 mg Oral Daily  . tiotropium  18 mcg Inhalation Daily    Continuous Infusions: . cefTRIAXone (ROCEPHIN)  IV Stopped (07/24/17 1821)    PRN Meds: acetaminophen **OR** acetaminophen, albuterol, haloperidol lactate, ondansetron **OR** ondansetron (ZOFRAN) IV, traMADol  Physical Exam        Elderly man, edentulous, in good spirits.  He sees his cat at bedside. CV irreg irreg Breathing on room air, no distress, decreased breath sounds Abdomen thin, soft, NT   Vital Signs: BP 118/85   Pulse (!) 102   Temp 98.3 F (36.8 C) (Oral)   Resp (!) 32   Ht 5\' 9"  (1.753 m)   Wt 93.3 kg (205 lb 11.2 oz)   SpO2 93%   BMI 30.38 kg/m  SpO2: SpO2: 93 % O2 Device: O2 Device: Not Delivered O2 Flow Rate: O2 Flow Rate (L/min): 2 L/min  Intake/output summary:  Intake/Output Summary (Last 24 hours) at 07/25/17 1033 Last data filed at 07/25/17 0400  Gross per 24 hour  Intake              485 ml  Output              200 ml  Net              285 ml   LBM: Last BM Date: 07/25/17 Baseline Weight: Weight: 98 kg (216 lb) Most recent weight: Weight: 93.3 kg (205 lb 11.2 oz)       Palliative Assessment/Data:    Flowsheet Rows     Most Recent Value  Intake Tab  Referral Department  Critical care  Unit at Time of Referral  ICU  Palliative Care Primary Diagnosis  Pulmonary  Date Notified  07/23/17  Palliative Care Type  New Palliative care  Reason for referral  Clarify Goals of  Care  Date of Admission  07/19/17  Date first seen by Palliative Care  07/24/17  # of days Palliative referral response time  1 Day(s)  # of days IP prior to Palliative referral  4  Clinical Assessment  Palliative Performance Scale Score  40%  Other Max Last 24 Hours  8  Psychosocial & Spiritual Assessment  Palliative Care Outcomes  Patient/Family meeting held?  Yes  Who was at the meeting?  son and dtr in law  Palliative Care Outcomes  Clarified goals of care  Patient/Family wishes: Interventions discontinued/not started   Mechanical Ventilation      Patient Active Problem List   Diagnosis Date Noted  . Tobacco use disorder 07/24/2017  . Encephalopathy   . Palliative care encounter   . Goals of care, counseling/discussion   . Pulmonary embolus ([place])   . UTI (urinary tract infection) 07/19/2017  . PTSD (post-traumatic stress disorder) 06/28/2017  . Adjustment disorder with anxiety 06/28/2017  . Closed compression fracture of L5 lumbar vertebra ([place])   . Left low back pain   . Weakness   . A-fib ([place]) 06/24/2017  . Prediabetes 01/18/2017  . Prostate cancer ([place]) 01/18/2017  . Anxiety 01/18/2017  . Preventative health care 04/15/2016  . Restless leg syndrome 04/03/2016  . COPD (chronic obstructive pulmonary disease) ([place]) 04/03/2016  . BPH (benign prostatic hyperplasia) 04/03/2016  . Personal history of tobacco use, presenting hazards to health 08/06/2015  Palliative Care Plan    Recommendations/Plan:  PMT to follow at a distance.  Appreciate Psych consult Palliative Care to follow outpatient at discharge please.  Goals of Care and Additional Recommendations:  Limitations on Scope of Treatment: Full Scope Treatment  Code Status:  DNR  Prognosis:   Unable to determine - likely months due to advanced heart failure, COPD, and psychiatric issues.   Discharge Planning:  To Be Determined  SNF ([facility]) with Palliative to follow  Care plan was  discussed with Son, CCM physician  Thank you for allowing the Palliative Medicine Team to assist in the care of this patient.  Total time spent:  35 min.     Greater than 50%  of this time was spent counseling and coordinating care related to the above assessment and plan.  Florentina Jenny, PA-C Palliative Medicine  Please contact Palliative MedicineTeam phone at [phone] for questions and concerns between 7 am - 7 pm.   Please see AMION for individual provider pager numbers.

## 2017-07-25 NOTE — Care Management (Signed)
RNCM spoke with patient's son [name] and he is not interested in LTAC option. I have updated both [facility] and [facility].

## 2017-07-25 NOTE — Progress Notes (Signed)
[facility] [place] NAME: Ricardo Erickson    MR#:  [id number]  DATE OF BIRTH:  1941-07-31  SUBJECTIVE:  CHIEF COMPLAINT:   Chief Complaint  Patient presents with  . Altered Mental Status  . Hallucinations   Patient's breathing much improved currently on room air     REVIEW OF SYSTEMS:  Review of Systems  Unable to perform ROS: Mental status change  Constitutional: Positive for malaise/fatigue. Negative for chills, diaphoresis, fever and weight loss.  HENT: Negative for ear discharge, ear pain, hearing loss and tinnitus.   Eyes: Negative for blurred vision, double vision and photophobia.  Respiratory: Negative for cough, hemoptysis and sputum production.   Cardiovascular: Negative for chest pain, palpitations and orthopnea.  Gastrointestinal: Negative for heartburn and nausea.  Genitourinary: Negative for dysuria and urgency.  Musculoskeletal: Negative for myalgias and neck pain.  Skin: Negative for itching and rash.  Neurological: Positive for weakness. Negative for dizziness, tingling and headaches.  Psychiatric/Behavioral: Negative for depression. The patient is nervous/anxious.    DRUG ALLERGIES:  No Known Allergies VITALS:  Blood pressure 118/85, pulse (!) 102, temperature 98.3 F (36.8 C), temperature source Oral, resp. rate (!) 32, height 5\' 9"  (1.753 m), weight 205 lb 11.2 oz (93.3 kg), SpO2 93 %. PHYSICAL EXAMINATION:  Physical Exam  Constitutional: He appears distressed.  HENT:  Head: Normocephalic and atraumatic.  Eyes: Pupils are equal, round, and reactive to light. Conjunctivae are normal.  Neck: Normal range of motion. Neck supple. No tracheal deviation present. No thyromegaly present.  Cardiovascular: Normal rate, regular rhythm and normal heart sounds.   Pulmonary/Chest: No respiratory distress. He has no decreased breath sounds. He has no wheezes. He has rhonchi. He exhibits no tenderness.  Abdominal: Soft.  Bowel sounds are normal. He exhibits no distension. There is no tenderness.  Musculoskeletal: Normal range of motion.  Neurological: He is not disoriented. No cranial nerve deficit.  Skin: Skin is warm and dry. No rash noted.  Psychiatric: Mood and affect normal.   LABORATORY PANEL:  Male CBC  Recent Labs Lab 07/24/17 0346  WBC 14.6*  HGB 14.4  HCT 42.6  PLT 180   ------------------------------------------------------------------------------------------------------------------ Chemistries   Recent Labs Lab 07/19/17 1734  07/24/17 0346 07/25/17 0353  NA 139  < > 138 144  K 3.4*  < > 3.3* 3.8  CL 98*  < > 103 106  CO2 30  < > 25 29  GLUCOSE 102*  < > 97 109*  BUN 15  < > 11 12  CREATININE 1.31*  < > 0.83 1.01  CALCIUM 9.4  < > 8.4* 8.7*  MG  --   < > 1.9  --   AST 23  --   --   --   ALT 11*  --   --   --   ALKPHOS 94  --   --   --   BILITOT 1.7*  --   --   --   < > = values in this interval not displayed. RADIOLOGY:  No results found. ASSESSMENT AND PLAN:   *Acute hypoxic respiratory failure due to pneumonia and pulmonary embolism pneumonia-  Continue Ceftriaxone  Bilateral submassive pulmonary embolism patient started on eliquis  * UTI (urinary tract infection)  - Status post therapy  * Hypokalemia -Replaced  * COPD (chronic obstructive pulmonary disease) ([place]) - continue home meds Continue nebulizer therapy and inhalers as taking at home  * Constipation - continue  stool softener   * Anxiety - continue BuSpar when necessary Xanax   * A-fib ([facility]) - continue amiodarone, digoxin, Cardizem    * BPH (benign prostatic hyperplasia) - continue home medications  Disposition nursing home in the next 1 -2 days  Physical therapy evaluation - recommends short-term rehabilitation, social worker working on placement  All the records are reviewed and case discussed with Care Management/Social Worker. Management plans discussed with the patient, family (son at  bedside) and they are in agreement.  CODE STATUS: DNR  TOTAL TIME TAKING CARE OF THIS PATIENT: 75min  More than 50% of the time was spent in counseling/coordination of care: YES  POSSIBLE D/C early next week, DEPENDING ON CLINICAL CONDITION.   Dustin Flock M.D on 07/25/2017 at 12:44 PM  Between 7am to 6pm - Pager - [phone]  After 6pm go to [email or web address] - Proofreader  [facility]  Office  [phone]  CC: Primary care physician; Rusty Aus, MD  Note: This dictation was prepared with Dragon dictation along with smaller phrase technology. Any transcriptional errors that result from this process are unintentional.

## 2017-07-25 NOTE — Clinical Social Work Note (Signed)
Patient transferred out of the ICU a couple of hours ago and is on the [facility] medical unit. Magda Paganini with [facility] called came to hospital and did a site visit to see patient. Will await Level 2 [facility] results. CSW will initiate another [employer] auth. Shela Leff MSW,LCSW [phone]

## 2017-07-26 LAB — CBC
HEMATOCRIT: 41.3 % (ref 40.0–52.0)
Hemoglobin: 13.7 g/dL (ref 13.0–18.0)
MCH: 29.9 pg (ref 26.0–34.0)
MCHC: 33.1 g/dL (ref 32.0–36.0)
MCV: 90.3 fL (ref 80.0–100.0)
Platelets: 231 10*3/uL (ref 150–440)
RBC: 4.58 MIL/uL (ref 4.40–5.90)
RDW: 14.5 % (ref 11.5–14.5)
WBC: 15 10*3/uL — ABNORMAL HIGH (ref 3.8–10.6)

## 2017-07-26 LAB — PROCALCITONIN: Procalcitonin: 0.21 ng/mL

## 2017-07-26 LAB — BASIC METABOLIC PANEL
Anion gap: 9 (ref 5–15)
BUN: 15 mg/dL (ref 6–20)
CO2: 27 mmol/L (ref 22–32)
Calcium: 8.4 mg/dL — ABNORMAL LOW (ref 8.9–10.3)
Chloride: 106 mmol/L (ref 101–111)
Creatinine, Ser: 1.03 mg/dL (ref 0.61–1.24)
GFR calc Af Amer: >60 mL/min (ref >60)
GLUCOSE: 105 mg/dL — AB (ref 65–99)
POTASSIUM: 3 mmol/L — AB (ref 3.5–5.1)
Sodium: 142 mmol/L (ref 135–145)

## 2017-07-26 LAB — BLOOD GAS, ARTERIAL
Acid-Base Excess: 6 mmol/L — ABNORMAL HIGH (ref 0.0–2.0)
Bicarbonate: 27.5 mmol/L (ref 20.0–28.0)
FIO2: 0.4
O2 Saturation: 88.9 %
PCO2 ART: 30 mmHg — AB (ref 32.0–48.0)
PO2 ART: 47 mmHg — AB (ref 83.0–108.0)
Patient temperature: 37
pH, Arterial: 7.57 — ABNORMAL HIGH (ref 7.350–7.450)

## 2017-07-26 NOTE — Care Management Important Message (Signed)
Important Message  Patient Details  Name: TREVYON SWOR MRN: [id number] Date of Birth: 29-Nov-1941   Medicare Important Message Given:  Yes    Beverly Sessions, RN 07/26/2017, 3:46 PM

## 2017-07-26 NOTE — Clinical Social Work Note (Signed)
Patient's son requested to speak with CSW. He wanted to get an update on everything and stated that he new insurance had authorized for patient to go to [facility] several days ago and that the physician and physical therapy both said today that patient was medically cleared and ready for discharge.   CSW explained to patient's son about the pasrr process and in addition, the insurance process and that the original auth from [facility] is only good for 48 hours. Patient's son clarified with nursing this afternoon that the physician actually documented potential discharge in 1 to 2 days.   Updated information sent to [facility] for re-auth. pasrr still pending. Shela Leff MSW,LCSW [phone]

## 2017-07-26 NOTE — Progress Notes (Signed)
[facility] [place] NAME: Ricardo Erickson    MR#:  [id number]  DATE OF BIRTH:  02-07-1941  SUBJECTIVE:   Pt. Here due to AMS and also noted to hypoxic and noted to have Pneumonia with PE. A bit lethargic and somnolent this morning.  Follows commands. No other acute events overnight.   REVIEW OF SYSTEMS:    Review of Systems  Unable to perform ROS: Mental acuity    Nutrition: Dysphagia II with thin liquids Tolerating Diet: Yes Tolerating PT: Await Eval.   DRUG ALLERGIES:  No Known Allergies  VITALS:  Blood pressure 137/66, pulse 71, temperature 98.5 F (36.9 C), temperature source Oral, resp. rate (!) 22, height 5\' 9"  (1.753 m), weight 93.3 kg (205 lb 11.2 oz), SpO2 91 %.  PHYSICAL EXAMINATION:   Physical Exam  GENERAL:  76 y.o.-year-old patient lying in bed lethargic/encephalopathic but follows commands.  EYES: Pupils equal, round, reactive to light and accommodation. No scleral icterus. Extraocular muscles intact.  HEENT: Head atraumatic, normocephalic. Oropharynx and nasopharynx clear.  NECK:  Supple, no jugular venous distention. No thyroid enlargement, no tenderness.  LUNGS: Normal breath sounds bilaterally, no wheezing, rales, rhonchi. No use of accessory muscles of respiration.  CARDIOVASCULAR: S1, S2 normal. No murmurs, rubs, or gallops.  ABDOMEN: Soft, nontender, nondistended. Bowel sounds present. No organomegaly or mass.  EXTREMITIES: No cyanosis, clubbing or edema b/l.    NEUROLOGIC: Cranial nerves II through XII are intact. No focal Motor or sensory deficits b/l.  Globally weak.  PSYCHIATRIC: The patient is alert and oriented x 1.  SKIN: No obvious rash, lesion, or ulcer.    LABORATORY PANEL:   CBC  Recent Labs Lab 07/26/17 0352  WBC 15.0*  HGB 13.7  HCT 41.3  PLT 231   ------------------------------------------------------------------------------------------------------------------  Chemistries   Recent  Labs Lab 07/19/17 1734  07/24/17 0346  07/26/17 0352  NA 139  < > 138  < > 142  K 3.4*  < > 3.3*  < > 3.0*  CL 98*  < > 103  < > 106  CO2 30  < > 25  < > 27  GLUCOSE 102*  < > 97  < > 105*  BUN 15  < > 11  < > 15  CREATININE 1.31*  < > 0.83  < > 1.03  CALCIUM 9.4  < > 8.4*  < > 8.4*  MG  --   < > 1.9  --   --   AST 23  --   --   --   --   ALT 11*  --   --   --   --   ALKPHOS 94  --   --   --   --   BILITOT 1.7*  --   --   --   --   < > = values in this interval not displayed. ------------------------------------------------------------------------------------------------------------------  Cardiac Enzymes  Recent Labs Lab 07/19/17 1802  TROPONINI 0.04*   ------------------------------------------------------------------------------------------------------------------  RADIOLOGY:  No results found.   ASSESSMENT AND PLAN:   76 yo male w/ hx of PTSD, HTN, Depression, Anxiety, Chronic back pain.  Hx of Tobacco abuseWho presented to the hospital due to altered mental status.   1. Altered mental status/encephalopathy-secondary to underlying dementia with anxiety. -Patient was quite somnolent this morning due to Haldol he received earlier this morning. We'll discontinue Haldol. Continue Seroquel. Appreciate psychiatric input. - cont. Seroquel.   2. Acute respiratory failure with  hypoxia-secondary to pneumonia and also underlying pulmonary embolism. -Patient has finished treatment for the pneumonia with IV ceftriaxone for 7 days. Continue Eliquis for the pulmonary embolism. - wean O2 as tolerated.   3. PE - cont. Eliquis.  - no hemoptysis and tolerating anticoagulation well.   4. COPD - no acute exacerbation.  - cont. Dulera, Spiriva, duonebs as needed  5. Hx of A. Fib - rate controlled.  Cont. Amiodarone, Cardizem.  - cont. Eliquis  6. BPH - cont. Flomax.   7. Tobacco abuse - cont. Nicotine patch.    Awaiting SNF placement.  Updated pt's son over the phone.     All the records are reviewed and case discussed with Care Management/Social Worker. Management plans discussed with the patient, family and they are in agreement.  CODE STATUS: DNR  DVT Prophylaxis: Eliquis  TOTAL TIME TAKING CARE OF THIS PATIENT: 30 minutes.   POSSIBLE D/C IN 1-2 DAYS, DEPENDING ON CLINICAL CONDITION.   Henreitta Leber M.D on 07/26/2017 at 2:23 PM  Between 7am to 6pm - Pager - [phone]  After 6pm go to [email or web address] - Proofreader  [facility]ospital[facility]  Office  [phone]  CC: Primary care physician; Rusty Aus, MD

## 2017-07-26 NOTE — Progress Notes (Addendum)
Pnt resting, had an uneventful evening. Pnt confused at times and tried to leave by getting out of bed. Re-directed pnt and oriented pnt to situation and room. Pnt continued to escalate and became upset. Pnt safely returned to bed, pnt agreed to medication and Haldol given per orders for agitation(see [facility]). Pnt back in bed, bed rails up x3 and bed alarm on and call bell in reach. Bed low and locked. No further issue or concerns. Pnt is now asleep but easily awakened and answers questions appropriately.  Will continue to monitor and assess.

## 2017-07-26 NOTE — Evaluation (Signed)
Physical Therapy Re-Evaluation Patient Details Name: Ricardo Erickson MRN: [id number] DOB: [birth year] Today's Date: 07/26/2017   History of Present Illness  Pt is a 76 y.o.malewho was admitted to [facility] with UTI, altered mental status, and weakness. Patient had a  recent kyphoplasty done 06/27/17 after compression fracture. He was in rehabilitation for a few days afterwards, and then went home. Pt. PMHx includes: UTI, COPD, anxiety, A-fib, and BPH with kyphoplasty 06/27/17. During admission pt was transferred to CCU secondary to bilateral massive PEs. He has now been transferred back to the floor and PT consulted for re-evaluation  Clinical Impression  Pt admitted with above diagnosis. Pt currently with functional limitations due to the deficits listed below (see PT Problem List). Pt is exceedingly weak and requires mod/maxA+1 for bed mobility and modA+1 for transfers. Performed sit to stand transfer twice, first attempt is with rolling walker and second attempt is with lifting support only. Pt requires modA+1 with lifting support. Once in standing pt with minimal weight shifting to LLE in standing secondary to reported pain. Attempted standing marches but pt unwilling to shift adequate weight to LLE in order to clear RLE from floor. Pt visibly fatigued. SaO2 drops to 88% however O2 increased prior to mobility to 4L/min due to low sats at rest. Unable to ambulate at this time. Pt complaining of L foot and ankle pain in WB positions. Only mild pain elicited with palpation of medial and lateral malleolus. No redness noted on either medial or lateral malleolus. RN notified of patient complaint. Pt will need SNF placement at discharge in order to return to prior level of function.        Follow Up Recommendations SNF    Equipment Recommendations  None recommended by PT    Recommendations for Other Services OT consult     Precautions / Restrictions Precautions Precautions: Fall;Back Precaution Booklet  Issued: No Precaution Comments: Kyphoplasty 06/27/17 during previous admission Restrictions Weight Bearing Restrictions: No      Mobility  Bed Mobility Overal bed mobility: Needs Assistance Bed Mobility: Supine to Sit     Supine to sit: Mod assist Sit to supine: Max assist   General bed mobility comments: Pt requires modA+1 for rolling onto the L side with log roll technique and pushing trunk upright into sitting position. Once upright he is able to maintain balance with supervision only. When returning to bed pt requires maxA+1 for log rolling technique and LE management.   Transfers Overall transfer level: Needs assistance Equipment used: Rolling walker (2 wheeled) Transfers: Sit to/from Stand Sit to Stand: Mod assist         General transfer comment: Performed sit to stand transfer twice, first attempt is with rolling walker and second attempt is with lifting support only. Pt requires modA+1 with lifting support. Once in standing pt with minimal weight shifting to LLE in standing. Attempted standing marches but pt unwilling to shift adequate weight to LLE in order to clear RLE from floor. Pt visibly fatigued. SaO2 drops to 88% however O2 increased prior to mobility to 4L/min due to low sats at rest. Unable to ambulate at this time  Ambulation/Gait                Stairs            Wheelchair Mobility    Modified Rankin (Stroke Patients Only)       Balance Overall balance assessment: Needs assistance Sitting-balance support: No upper extremity supported Sitting balance-Leahy Scale: Fair  Standing balance support: Bilateral upper extremity supported Standing balance-Leahy Scale: Poor Standing balance comment: Pt requires steady UE support and min/modA+1 support from therapist to remain in standing                             Pertinent Vitals/Pain Pain Assessment: 0-10 Pain Score: 8  Pain Location: "My bottom" Pain Descriptors /  Indicators: Sore Pain Intervention(s): Monitored during session;Other (comment) (RN notified)    Home Living Family/patient expects to be discharged to:: Private residence Living Arrangements: Alone Available Help at Discharge: Family;Available PRN/intermittently;Personal care attendant;Other (Comment) (Children check on patient regularly, [place] aid 6x/wk) Type of Home: House Home Access: Stairs to enter Entrance Stairs-Rails: Right Entrance Stairs-Number of Steps: 2 Home Layout: One level Home Equipment: Cane - single point;Walker - 2 wheels;Tub bench;Shower seat;Grab bars - tub/shower;Other (comment) (no hospital bed, no wheelchair, no lift chair)      Prior Function Level of Independence: Needs assistance   Gait / Transfers Assistance Needed: Modified independent with ambulation using rolling walker since kyphoplasty. No falls in the last 12 months  ADL's / Homemaking Assistance Needed: family/HH aid with cooking/cleaning, med mgt, bathing; pt reports sleeping in recliner for years        Hand Dominance   Dominant Hand: Right    Extremity/Trunk Assessment   Upper Extremity Assessment Upper Extremity Assessment: Overall WFL for tasks assessed    Lower Extremity Assessment Lower Extremity Assessment: Generalized weakness;LLE deficits/detail LLE Deficits / Details: Pt with difficulty performing SLR on LLE without assist. He is able to perform with RLE. Difficulty accepting weight onto LLE in standing secondary to foot/ankle pain. Reports fully intact sensation to light touch throughout bilateral UE/LE       Communication   Communication: HOH  Cognition Arousal/Alertness: Lethargic;Suspect due to medications Behavior During Therapy: [facility] for tasks assessed/performed Overall Cognitive Status: No family/caregiver present to determine baseline cognitive functioning                                 General Comments: AOx4, lethargic and somnolent throughout  re-evaluation      General Comments      Exercises General Exercises - Lower Extremity Ankle Circles/Pumps: AROM;Both;10 reps;Supine Quad Sets: Strengthening;Both;5 reps;Supine Hip ABduction/ADduction: Strengthening;Both;10 reps;Supine Straight Leg Raises: Strengthening;Both;10 reps;Supine   Assessment/Plan    PT Assessment Patient needs continued PT services  PT Problem List Decreased strength;Decreased activity tolerance;Decreased balance;Decreased knowledge of use of DME;Decreased mobility;Cardiopulmonary status limiting activity       PT Treatment Interventions DME instruction;Gait training;Stair training;Functional mobility training;Neuromuscular re-education;Balance training;Therapeutic activities;Therapeutic exercise;Patient/family education    PT Goals (Current goals can be found in the Care Plan section)  Acute Rehab PT Goals Patient Stated Goal: To regain independence and be able to watch his grandson play football PT Goal Formulation: With patient/family Time For Goal Achievement: 08/09/17 Potential to Achieve Goals: Fair    Frequency Min 2X/week   Barriers to discharge Decreased caregiver support;Inaccessible home environment Lives alone and has steps to enter    Co-evaluation               AM-PAC PT "6 Clicks" Daily Activity  Outcome Measure Difficulty turning over in bed (including adjusting bedclothes, sheets and blankets)?: Total Difficulty moving from lying on back to sitting on the side of the bed? : Total Difficulty sitting down on and standing up from a  chair with arms (e.g., wheelchair, bedside commode, etc,.)?: Total Help needed moving to and from a bed to chair (including a wheelchair)?: A Lot Help needed walking in hospital room?: Total Help needed climbing 3-5 steps with a railing? : Total 6 Click Score: 7    End of Session Equipment Utilized During Treatment: Gait belt;Oxygen Activity Tolerance: Patient limited by lethargy;Patient  limited by pain Patient left: in bed;with bed alarm set;with call bell/phone within reach Nurse Communication: Other (comment) (Needs new call bell (CNA delivered), LLE pain in standing) PT Visit Diagnosis: Difficulty in walking, not elsewhere classified (R26.2);Muscle weakness (generalized) (M62.81);Unsteadiness on feet (R26.81)    Time: 9937-1696 PT Time Calculation (min) (ACUTE ONLY): 30 min   Charges:   PT Evaluation $PT Re-evaluation: 1 Re-eval PT Treatments $Therapeutic Exercise: 8-22 mins   PT G Codes:   PT G-Codes **NOT FOR INPATIENT CLASS** Functional Assessment Tool Used: AM-PAC 6 Clicks Basic Mobility Functional Limitation: Mobility: Walking and moving around Mobility: Walking and Moving Around Current Status (V8938): At least 80 percent but less than 100 percent impaired, limited or restricted Mobility: Walking and Moving Around Goal Status [phone]): At least 20 percent but less than 40 percent impaired, limited or restricted    Phillips Grout PT, DPT    Ashante Yellin 07/26/2017, 3:21 PM

## 2017-07-26 NOTE — Progress Notes (Signed)
Daily Progress Note   Patient Name: Ricardo Erickson       Date: 07/26/2017 DOB: 1941/02/09  Age: 76 y.o. MRN#: [id number] Attending Physician: Henreitta Leber, MD Primary Care Physician: Rusty Aus, MD Admit Date: 07/19/2017  Reason for Consultation/Follow-up: Establishing goals of care and Psychosocial/spiritual support  Subjective: Patient is somewhat lethargic but states that he is comfortable and asks that we raise his head up.  Talked with son in the hallway.  He requested that we attempt to use calming and attention diverting techniques for his father when he has a panic attack rather than using sedating medication. We discussed PT eval - patient is very weak.  Son wants to touch base with social work to ensure that the patient is on track to go back to [facility] on discharge.  We discussed Palliative Care to follow at SNF.  Son stated he would consider it.   Assessment: 77 yo male with acute respiratory failure and delirium.  Improving.  Now very weak and deconditioned.   Patient Profile/HPI: 76 y.o. male  with past medical history of PTSD, COPD (tobacco abuse), recent kyphoplasty (7/17) who was admitted on 07/19/2017 with UTI.  After admission he developed respiratory failure and was found to have bilateral pneumonia, PE is significant clot burden and right heart strain.  2D echo shows an EF of 20 - 25%.  He has been having afib with RVR and is currently on high flow nasal canula.  Unfortunately all of this is further complicated by acute delirium and paranoia   Length of Stay: 7  Current Medications: Scheduled Meds:  . amiodarone  200 mg Oral Daily  . apixaban  10 mg Oral BID   Followed by  . [START ON 07/31/2017] apixaban  5 mg Oral BID  . aspirin EC  81 mg Oral Daily   . digoxin  0.125 mg Oral Daily  . diltiazem  240 mg Oral Daily  . famotidine  20 mg Oral BID  . feeding supplement (ENSURE ENLIVE)  237 mL Oral BID BM  . furosemide  20 mg Oral Daily  . lidocaine  1 patch Transdermal Q24H  . mometasone-formoterol  2 puff Inhalation BID  . nicotine  21 mg Transdermal Daily  . pramipexole  1 mg Oral QHS  . QUEtiapine  25  mg Oral TID  . senna-docusate  2 tablet Oral BID  . tamsulosin  0.4 mg Oral Daily  . tiotropium  18 mcg Inhalation Daily    Continuous Infusions:   PRN Meds: acetaminophen **OR** acetaminophen, albuterol, ondansetron **OR** ondansetron (ZOFRAN) IV, traMADol  Physical Exam       Well developed elderly male,  NAD, mildly lethargic CV rrr resp on 4L no distress Abdomen soft NT, ND  Vital Signs: BP 137/66 (BP Location: Left Arm)   Pulse 71   Temp 98.5 F (36.9 C) (Oral)   Resp (!) 22   Ht 5\' 9"  (1.753 m)   Wt 93.3 kg (205 lb 11.2 oz)   SpO2 91%   BMI 30.38 kg/m  SpO2: SpO2: 91 % O2 Device: O2 Device: Nasal Cannula O2 Flow Rate: O2 Flow Rate (L/min): 2 L/min  Intake/output summary:  Intake/Output Summary (Last 24 hours) at 07/26/17 1545 Last data filed at 07/26/17 0900  Gross per 24 hour  Intake              480 ml  Output                0 ml  Net              480 ml   LBM: Last BM Date: 07/25/17 Baseline Weight: Weight: 98 kg (216 lb) Most recent weight: Weight: 93.3 kg (205 lb 11.2 oz)       Palliative Assessment/Data:    Flowsheet Rows     Most Recent Value  Intake Tab  Referral Department  Critical care  Unit at Time of Referral  ICU  Palliative Care Primary Diagnosis  Pulmonary  Date Notified  07/23/17  Palliative Care Type  New Palliative care  Reason for referral  Clarify Goals of Care  Date of Admission  07/19/17  Date first seen by Palliative Care  07/24/17  # of days Palliative referral response time  1 Day(s)  # of days IP prior to Palliative referral  4  Clinical Assessment  Palliative  Performance Scale Score  40%  Other Max Last 24 Hours  8  Psychosocial & Spiritual Assessment  Palliative Care Outcomes  Patient/Family meeting held?  Yes  Who was at the meeting?  son and dtr in law  Palliative Care Outcomes  Clarified goals of care  Patient/Family wishes: Interventions discontinued/not started   Mechanical Ventilation      Patient Active Problem List   Diagnosis Date Noted  . Tobacco use disorder 07/24/2017  . Encephalopathy   . Palliative care encounter   . Goals of care, counseling/discussion   . Pulmonary embolus ([place])   . UTI (urinary tract infection) 07/19/2017  . PTSD (post-traumatic stress disorder) 06/28/2017  . Adjustment disorder with anxiety 06/28/2017  . Closed compression fracture of L5 lumbar vertebra ([place])   . Left low back pain   . Weakness   . A-fib ([place]) 06/24/2017  . Prediabetes 01/18/2017  . Prostate cancer ([place]) 01/18/2017  . Anxiety 01/18/2017  . Preventative health care 04/15/2016  . Restless leg syndrome 04/03/2016  . COPD (chronic obstructive pulmonary disease) ([place]) 04/03/2016  . BPH (benign prostatic hyperplasia) 04/03/2016  . Personal history of tobacco use, presenting hazards to health 08/06/2015    Palliative Care Plan    Recommendations/Plan:  Please offer once again to son - Palliative Care to follow at SNF  Goals of Care and Additional Recommendations:  Limitations on Scope of Treatment: Full Scope Treatment  Code  Status:  DNR  Prognosis:   < 12 months given heart failure, respiratory failure and underlying PTSD/psychiatric diagnosis   Discharge Planning:  [place] for rehab with Palliative care service follow-up  Care plan was discussed with son, [name]  Thank you for allowing the Palliative Medicine Team to assist in the care of this patient.  Total time spent:  35 min.     Greater than 50%  of this time was spent counseling and coordinating care related to the above assessment and  plan.  Florentina Jenny, PA-C Palliative Medicine  Please contact Palliative MedicineTeam phone at [phone] for questions and concerns between 7 am - 7 pm.   Please see AMION for individual provider pager numbers.

## 2017-07-27 ENCOUNTER — Inpatient Hospital Stay: Payer: Medicare Other

## 2017-07-27 LAB — BASIC METABOLIC PANEL
Anion gap: 8 (ref 5–15)
BUN: 16 mg/dL (ref 6–20)
CALCIUM: 8.1 mg/dL — AB (ref 8.9–10.3)
CO2: 27 mmol/L (ref 22–32)
Chloride: 106 mmol/L (ref 101–111)
Creatinine, Ser: 0.96 mg/dL (ref 0.61–1.24)
GFR calc Af Amer: >60 mL/min (ref >60)
GLUCOSE: 107 mg/dL — AB (ref 65–99)
Potassium: 2.7 mmol/L — CL (ref 3.5–5.1)
SODIUM: 141 mmol/L (ref 135–145)

## 2017-07-27 LAB — CBC
HCT: 40.3 % (ref 40.0–52.0)
Hemoglobin: 13.5 g/dL (ref 13.0–18.0)
MCH: 30.4 pg (ref 26.0–34.0)
MCHC: 33.6 g/dL (ref 32.0–36.0)
MCV: 90.5 fL (ref 80.0–100.0)
PLATELETS: 238 10*3/uL (ref 150–440)
RBC: 4.45 MIL/uL (ref 4.40–5.90)
RDW: 14.3 % (ref 11.5–14.5)
WBC: 13.4 10*3/uL — AB (ref 3.8–10.6)

## 2017-07-27 LAB — POTASSIUM: POTASSIUM: 3.8 mmol/L (ref 3.5–5.1)

## 2017-07-27 MED ORDER — POTASSIUM CHLORIDE 10 MEQ/100ML IV SOLN
INTRAVENOUS | Status: AC
  Filled 2017-07-27: qty 100

## 2017-07-27 MED ORDER — QUETIAPINE FUMARATE 25 MG PO TABS: 25.0000 mg | ORAL_TABLET | Freq: Every day | ORAL | Status: DC

## 2017-07-27 MED ORDER — SODIUM CHLORIDE 0.9 % IV SOLN
3.0000 g | Freq: Four times a day (QID) | INTRAVENOUS | Status: DC
  Administered 2017-07-27 – 2017-07-28 (×4): 3 g via INTRAVENOUS
  Filled 2017-07-27 (×10): qty 3

## 2017-07-27 MED ORDER — APIXABAN 5 MG PO TABS: ORAL_TABLET | ORAL | Status: DC

## 2017-07-27 MED ORDER — HEPARIN SODIUM (PORCINE) 5000 UNIT/ML IJ SOLN
INTRAMUSCULAR | Status: AC
  Filled 2017-07-27: qty 1

## 2017-07-27 MED ORDER — AMIODARONE HCL 200 MG PO TABS: 200.0000 mg | ORAL_TABLET | Freq: Every day | ORAL | Status: DC

## 2017-07-27 MED ORDER — POTASSIUM CHLORIDE CRYS ER 20 MEQ PO TBCR
40.0000 meq | EXTENDED_RELEASE_TABLET | Freq: Once | ORAL | Status: AC
  Administered 2017-07-27: 40 meq via ORAL

## 2017-07-27 MED ORDER — POTASSIUM CHLORIDE 10 MEQ/100ML IV SOLN
10.0000 meq | INTRAVENOUS | Status: AC
  Administered 2017-07-27 (×3): 10 meq via INTRAVENOUS
  Filled 2017-07-27: qty 100

## 2017-07-27 MED ORDER — TRAMADOL HCL 50 MG PO TABS: 50.0000 mg | ORAL_TABLET | Freq: Two times a day (BID) | ORAL | 0 refills | Status: DC | PRN

## 2017-07-27 MED ORDER — LIDOCAINE 5 % EX PTCH: 1.0000 | MEDICATED_PATCH | CUTANEOUS | 0 refills | Status: DC

## 2017-07-27 MED ORDER — SODIUM CHLORIDE 0.9 % IV SOLN
INTRAVENOUS | Status: DC
  Administered 2017-07-27: 17:00:00 via INTRAVENOUS

## 2017-07-27 MED ORDER — POTASSIUM CHLORIDE CRYS ER 20 MEQ PO TBCR
EXTENDED_RELEASE_TABLET | ORAL | Status: AC
  Filled 2017-07-27: qty 2

## 2017-07-27 NOTE — Progress Notes (Signed)
Pnt is alert and talking this AM. Pnt requested to watch TV. No issues or concerns overnight. Pnt has rested and appeared comfortable. No anxiety or agitation overnight. Pnt bed low, locked with bed alarm on. Call bell in reach, pnt is sitting up watching TV.

## 2017-07-27 NOTE — Accreditation Note (Signed)
[facility] and [facility] auth obtained. Ricardo Erickson at [facility] is aware and that patient will be coming to them today. Patient's son notified and requests EMS transport. Shela Leff MSW,LcSW [phone]

## 2017-07-27 NOTE — Progress Notes (Signed)
Patient with poor PO intake.  Pt is lethargic but easily aroused.  16:00 dose of Seroquel not given due to lethargy.  Dr. Ferne Reus notified and verbal orders obtained.

## 2017-07-27 NOTE — Progress Notes (Signed)
Pged and spoke to Dr. Estanislado Pandy concerning pnts K+2.7. Per MD orders will be entered. Pnt sitting up in bed awake drinking soda. Pnt appears comfortable in no distress will continue to monitor.

## 2017-07-27 NOTE — Discharge Summary (Addendum)
[facility] [place] NAME: Ricardo Erickson    MR#:  [id number]  DATE OF BIRTH:  1941-03-19  DATE OF ADMISSION:  07/19/2017 ADMITTING PHYSICIAN: Lance Coon, MD  DATE OF DISCHARGE: 07/28/2017  PRIMARY CARE PHYSICIAN: Rusty Aus, MD    ADMISSION DIAGNOSIS:  Encephalopathy [G93.40] Generalized weakness [R53.1] Cystitis [N30.90]  DISCHARGE DIAGNOSIS:  Principal Problem:   Encephalopathy Active Problems:   Restless leg syndrome   COPD (chronic obstructive pulmonary disease) (HCC)   BPH (benign prostatic hyperplasia)   Anxiety   A-fib (HCC)   UTI (urinary tract infection)   Pulmonary embolus (HCC)   Palliative care encounter   Goals of care, counseling/discussion   Tobacco use disorder   SECONDARY DIAGNOSIS:   Past Medical History:  Diagnosis Date  . Chronic back pain   . Depression   . Emphysema lung ([place])   . Hypertension   . Personal history of tobacco use, presenting hazards to health 08/06/2015  . Pollen allergies   . PTSD (post-traumatic stress disorder)     HOSPITAL COURSE:   76 yo male w/ hx of PTSD, HTN, Depression, Anxiety, Chronic back pain.  Hx of Tobacco abuseWho presented to the hospital due to altered mental status.  1. Altered mental status/encephalopathy-secondary to underlying dementia with anxiety. -patient was seen by psychiatry and given some Haldol as needed for agitation also started on some Seroquel. Patient's mental status has improved since admission.He is currently alert and awake and oriented. His Haldol has been discontinued due to prolonged QTC and some somnolence. He'll be discharged on a small dose of Seroquel at bedtime.  2. Acute respiratory failure with hypoxia- this was secondary to pneumonia and also underlying pulmonary embolism and also underlying COPD.  - he has clinically improved and now being discharged on Oral Eliquis for his PE.  He was seen by Speech therapy and his diet does not  need to be adjusted.  - He has finished 7 days of IV ceftriaxone and now will be discharged on 5 more days of Oral Levaquin.  - he will cont. O2 supplementation at 2-3 L [place] to tolerated O2 sats at 90-92% or higher.   3. PE - pt. Will cont. Eliquis.  - no hemoptysis and tolerating anticoagulation well.   4. COPD - no acute exacerbation.  - he will cont. His Advair, albuterol inhaler and albuterol nebs as needed.   5. Hx of A. Fib - rate controlled.  he will Cont. Amiodarone, Cardizem.  - he will cont. Eliquis for Long term anti-coaguation.   6. BPH - he will cont. Flomax.   7. Restless Leg syndrome - he will cont. His Requip.   8. Hypokalemia - this has been adequately replaced and improved and pt. Is being discharged on Oral Potassium supplements.     DISCHARGE CONDITIONS:   Stable.   CONSULTS OBTAINED:  Treatment Team:  Flora Lipps, MD Clovis Fredrickson, MD  DRUG ALLERGIES:  No Known Allergies  DISCHARGE MEDICATIONS:   Allergies as of 07/28/2017   No Known Allergies     Medication List    STOP taking these medications   ALPRAZolam 1 MG tablet Commonly known as:  XANAX   aspirin EC 325 MG tablet   docusate sodium 100 MG capsule Commonly known as:  COLACE   oxyCODONE-acetaminophen 5-325 MG tablet Commonly known as:  PERCOCET/ROXICET     TAKE these medications   acetaminophen 650 MG CR tablet Commonly known as:  TYLENOL Take 650 mg by mouth every 8 (eight) hours as needed for pain.   ADVAIR DISKUS 250-50 MCG/DOSE Aepb Generic drug:  Fluticasone-Salmeterol Inhale 1 puff into the lungs every 12 (twelve) hours. Reported on 04/01/2016   albuterol 108 (90 Base) MCG/ACT inhaler Commonly known as:  PROVENTIL HFA;VENTOLIN HFA Inhale 2 puffs into the lungs as needed for wheezing or shortness of breath.   albuterol (2.5 MG/3ML) 0.083% nebulizer solution Commonly known as:  PROVENTIL Inhale 2.5 mg into the lungs every 6 (six) hours as needed.    amiodarone 200 MG tablet Commonly known as:  PACERONE Take 1 tablet (200 mg total) by mouth daily. What changed:  medication strength  how much to take  when to take this   apixaban 5 MG Tabs tablet Commonly known as:  ELIQUIS Take 10 mg PO BID X 4 days then take 5 mg PO BID   busPIRone 7.5 MG tablet Commonly known as:  BUSPAR Take 1 tablet twice daily for anxiety.   cyclobenzaprine 5 MG tablet Commonly known as:  FLEXERIL Take 5 mg by mouth 2 (two) times daily as needed for muscle spasms.   digoxin 0.125 MG tablet Commonly known as:  LANOXIN Take 1 tablet (0.125 mg total) by mouth daily.   diltiazem 240 MG 24 hr capsule Commonly known as:  CARDIZEM CD Take 1 capsule (240 mg total) by mouth daily.   furosemide 20 MG tablet Commonly known as:  LASIX Take 20 mg by mouth daily.   levofloxacin 500 MG tablet Commonly known as:  LEVAQUIN Take 1 tablet (500 mg total) by mouth daily.   lidocaine 5 % Commonly known as:  LIDODERM Place 1 patch onto the skin daily. Remove & Discard patch within 12 hours or as directed by MD   polyethylene glycol packet Commonly known as:  MIRALAX / GLYCOLAX Take 17 g by mouth daily as needed for mild constipation.   potassium chloride 10 MEQ tablet Commonly known as:  K-DUR,KLOR-CON Take 1 tablet (10 mEq total) by mouth daily.   pramipexole 1 MG tablet Commonly known as:  MIRAPEX Take 1 mg by mouth at bedtime.   QUEtiapine 25 MG tablet Commonly known as:  SEROQUEL Take 1 tablet (25 mg total) by mouth at bedtime.   tamsulosin 0.4 MG Caps capsule Commonly known as:  FLOMAX Take 0.4 mg by mouth daily.   tiotropium 18 MCG inhalation capsule Commonly known as:  SPIRIVA Place 18 mcg into inhaler and inhale daily.   traMADol 50 MG tablet Commonly known as:  ULTRAM Take 1 tablet (50 mg total) by mouth every 12 (twelve) hours as needed. What changed:  when to take this  reasons to take this         DISCHARGE  INSTRUCTIONS:   DIET:  Cardiac diet Dysphagia II with thin liquids  DISCHARGE CONDITION:  Stable  ACTIVITY:  Activity as tolerated  OXYGEN:  Home Oxygen: Yes.     Oxygen Delivery: 2-3 liters/min via Patient connected to nasal cannula oxygen  DISCHARGE LOCATION:  nursing home   If you experience worsening of your admission symptoms, develop shortness of breath, life threatening emergency, suicidal or homicidal thoughts you must seek medical attention immediately by calling 911 or calling your MD immediately  if symptoms less severe.  You Must read complete instructions/literature along with all the possible adverse reactions/side effects for all the Medicines you take and that have been prescribed to you. Take any new Medicines after you have completely understood  and accpet all the possible adverse reactions/side effects.   Please note  You were cared for by a hospitalist during your hospital stay. If you have any questions about your discharge medications or the care you received while you were in the hospital after you are discharged, you can call the unit and asked to speak with the hospitalist on call if the hospitalist that took care of you is not available. Once you are discharged, your primary care physician will handle any further medical issues. Please note that NO REFILLS for any discharge medications will be authorized once you are discharged, as it is imperative that you return to your primary care physician (or establish a relationship with a primary care physician if you do not have one) for your aftercare needs so that they can reassess your need for medications and monitor your lab values.     Today   Mental status much improved today and pt. Is AAOX 3.  Resp. Status stable.  No acute events overnight.    VITAL SIGNS:  Blood pressure (!) 148/75, pulse (!) 42, temperature 98.3 F (36.8 C), temperature source Oral, resp. rate 18, height 5\' 9"  (1.753 m), weight 93.3  kg (205 lb 11.2 oz), SpO2 93 %.  I/O:    Intake/Output Summary (Last 24 hours) at 07/28/17 1403 Last data filed at 07/28/17 1023  Gross per 24 hour  Intake             1662 ml  Output                0 ml  Net             1662 ml    PHYSICAL EXAMINATION:   GENERAL:  76 y.o.-year-old patient lying in bed awake and in NAD.  EYES: Pupils equal, round, reactive to light and accommodation. No scleral icterus. Extraocular muscles intact.  HEENT: Head atraumatic, normocephalic. Oropharynx and nasopharynx clear.  NECK:  Supple, no jugular venous distention. No thyroid enlargement, no tenderness.  LUNGS: Normal breath sounds bilaterally, no wheezing, rales, rhonchi. No use of accessory muscles of respiration.  CARDIOVASCULAR: S1, S2 normal. No murmurs, rubs, or gallops.  ABDOMEN: Soft, nontender, nondistended. Bowel sounds present. No organomegaly or mass.  EXTREMITIES: No cyanosis, clubbing or edema b/l.    NEUROLOGIC: Cranial nerves II through XII are intact. No focal Motor or sensory deficits b/l.  Globally weak.  PSYCHIATRIC: The patient is alert and oriented x 3.  SKIN: No obvious rash, lesion, or ulcer.   DATA REVIEW:   CBC  Recent Labs Lab 07/28/17 0354  WBC 13.6*  HGB 13.4  HCT 40.2  PLT 277    Chemistries   Recent Labs Lab 07/24/17 0346  07/28/17 0354  NA 138  < > 140  K 3.3*  < > 3.2*  CL 103  < > 106  CO2 25  < > 28  GLUCOSE 97  < > 118*  BUN 11  < > 17  CREATININE 0.83  < > 1.06  CALCIUM 8.4*  < > 8.1*  MG 1.9  --   --   < > = values in this interval not displayed.  Cardiac Enzymes No results for input(s): TROPONINI in the last 168 hours.  Microbiology Results  Results for orders placed or performed during the hospital encounter of 07/19/17  Urine culture     Status: Abnormal   Collection Time: 07/19/17  8:25 PM  Result Value Ref Range Status  Specimen Description URINE, RANDOM  Final   Special Requests NONE  Final   Culture MULTIPLE SPECIES  PRESENT, SUGGEST RECOLLECTION (A)  Final   Report Status 07/21/2017 FINAL  Final  Culture, blood (Routine X 2) w Reflex to ID Panel     Status: None   Collection Time: 07/23/17  5:40 PM  Result Value Ref Range Status   Specimen Description BLOOD BLOOD RIGHT HAND  Final   Special Requests BOTTLES DRAWN AEROBIC AND ANAEROBIC  Final   Culture NO GROWTH 5 DAYS  Final   Report Status 07/28/2017 FINAL  Final  Culture, blood (Routine X 2) w Reflex to ID Panel     Status: None   Collection Time: 07/23/17  5:40 PM  Result Value Ref Range Status   Specimen Description BLOOD BLOOD LEFT HAND  Final   Special Requests BOTTLES DRAWN AEROBIC ONLY  Final   Culture NO GROWTH 5 DAYS  Final   Report Status 07/28/2017 FINAL  Final  MRSA PCR Screening     Status: None   Collection Time: 07/23/17 10:30 PM  Result Value Ref Range Status   MRSA by PCR NEGATIVE NEGATIVE Final    Comment:        The GeneXpert MRSA Assay (FDA approved for NASAL specimens only), is one component of a comprehensive MRSA colonization surveillance program. It is not intended to diagnose MRSA infection nor to guide or monitor treatment for MRSA infections.     RADIOLOGY:  Dg Chest Port 1 View  Result Date: 07/28/2017 CLINICAL DATA:  Shortness of breath. EXAM: PORTABLE CHEST 1 VIEW COMPARISON:  Radiograph of July 27, 2017. FINDINGS: Stable cardiomediastinal silhouette. Atherosclerosis of thoracic aorta is noted. No pneumothorax or pleural effusion is noted. Mild diffuse interstitial densities are noted throughout both lungs which may represent scarring, but superimposed edema or inflammation cannot be excluded. Bony thorax is unremarkable. IMPRESSION: Aortic atherosclerosis. Mild diffuse interstitial densities are noted in both lungs, particularly in the lung bases, which may simply represent scarring, but superimposed acute edema or inflammation cannot be excluded. Electronically Signed   By: Marijo Conception, M.D.   On:  07/28/2017 11:24   Dg Chest Port 1 View  Result Date: 07/27/2017 CLINICAL DATA:  Hypoxia, low-grade fever, coughing with thin liquids, smoker, emphysema, hypertension EXAM: PORTABLE CHEST 1 VIEW COMPARISON:  Portable exam 1312 hours compared 07/24/2017 FINDINGS: Normal heart size, mediastinal contours, and pulmonary vascularity. Diffuse interstitial infiltrates, slightly improved in RIGHT lung though persisting in LEFT lung and in RIGHT upper lobe. No pleural effusion or pneumothorax. Bones demineralized. IMPRESSION: Persistent diffuse BILATERAL interstitial infiltrates question multifocal pneumonia, slightly improved on RIGHT. Electronically Signed   By: Lavonia Dana M.D.   On: 07/27/2017 13:34      Management plans discussed with the patient, family and they are in agreement.  CODE STATUS:     Code Status Orders        Start     Ordered   07/21/17 1309  Do not attempt resuscitation (DNR)  Continuous    Question Answer Comment  In the event of cardiac or respiratory ARREST Do not call a "code blue"   In the event of cardiac or respiratory ARREST Do not perform Intubation, CPR, defibrillation or ACLS   In the event of cardiac or respiratory ARREST Use medication by any route, position, wound care, and other measures to relive pain and suffering. May use oxygen, suction and manual treatment of airway obstruction as needed for comfort.  07/21/17 1308    TOTAL TIME TAKING CARE OF THIS PATIENT: 40 minutes.    Henreitta Leber M.D on 07/28/2017 at 2:03 PM  Between 7am to 6pm - Pager - [phone]  After 6pm go to [email or web address] - Proofreader  [facility]  Office  [phone]  CC: Primary care physician; Rusty Aus, MD

## 2017-07-27 NOTE — Progress Notes (Signed)
Lab called critical K+ value of 2.7; notified Margarette Canada, RN, care nurse. Barbaraann Faster, RN 4:25 AM 07/27/2017

## 2017-07-27 NOTE — Progress Notes (Signed)
[facility] [place] NAME: Ricardo Erickson    MR#:  [id number]  DATE OF BIRTH:  1941/07/06  SUBJECTIVE:   Pt. Here due to AMS and also noted to hypoxic and noted to have Pneumonia with PE. Mental status much improved today and following commands. Patient noted to have a low-grade fever and also having some cough with drinking his thin liquids. Chest x-ray obtained showing multifocal pneumonia.  REVIEW OF SYSTEMS:    Review of Systems  Constitutional: Negative for chills and fever.  HENT: Negative for congestion and tinnitus.   Eyes: Negative for blurred vision and double vision.  Respiratory: Positive for cough. Negative for shortness of breath and wheezing.   Cardiovascular: Negative for chest pain, orthopnea and PND.  Gastrointestinal: Negative for abdominal pain, diarrhea, nausea and vomiting.  Genitourinary: Negative for dysuria and hematuria.  Neurological: Positive for weakness. Negative for dizziness, sensory change and focal weakness.  All other systems reviewed and are negative.   Nutrition: Dysphagia II with thin liquids Tolerating Diet: Yes Tolerating PT: Eval noted.   DRUG ALLERGIES:  No Known Allergies  VITALS:  Blood pressure 129/64, pulse 71, temperature 99.1 F (37.3 C), temperature source Oral, resp. rate (!) 28, height 5\' 9"  (1.753 m), weight 93.3 kg (205 lb 11.2 oz), SpO2 92 %.  PHYSICAL EXAMINATION:   Physical Exam  GENERAL:  76 y.o.-year-old patient lying in bed in NAD and following commands.  EYES: Pupils equal, round, reactive to light and accommodation. No scleral icterus. Extraocular muscles intact.  HEENT: Head atraumatic, normocephalic. Oropharynx and nasopharynx clear.  NECK:  Supple, no jugular venous distention. No thyroid enlargement, no tenderness.  LUNGS: Normal breath sounds bilaterally, no wheezing, rales, rhonchi. No use of accessory muscles of respiration.  CARDIOVASCULAR: S1, S2 normal. No  murmurs, rubs, or gallops.  ABDOMEN: Soft, nontender, nondistended. Bowel sounds present. No organomegaly or mass.  EXTREMITIES: No cyanosis, clubbing or edema b/l.    NEUROLOGIC: Cranial nerves II through XII are intact. No focal Motor or sensory deficits b/l.   Globally weak PSYCHIATRIC: The patient is alert and oriented x 3.  SKIN: No obvious rash, lesion, or ulcer.    LABORATORY PANEL:   CBC  Recent Labs Lab 07/27/17 0335  WBC 13.4*  HGB 13.5  HCT 40.3  PLT 238   ------------------------------------------------------------------------------------------------------------------  Chemistries   Recent Labs Lab 07/24/17 0346  07/27/17 0335 07/27/17 1314  NA 138  < > 141  --   K 3.3*  < > 2.7* 3.8  CL 103  < > 106  --   CO2 25  < > 27  --   GLUCOSE 97  < > 107*  --   BUN 11  < > 16  --   CREATININE 0.83  < > 0.96  --   CALCIUM 8.4*  < > 8.1*  --   MG 1.9  --   --   --   < > = values in this interval not displayed. ------------------------------------------------------------------------------------------------------------------  Cardiac Enzymes No results for input(s): TROPONINI in the last 168 hours. ------------------------------------------------------------------------------------------------------------------  RADIOLOGY:  Dg Chest Port 1 View  Result Date: 07/27/2017 CLINICAL DATA:  Hypoxia, low-grade fever, coughing with thin liquids, smoker, emphysema, hypertension EXAM: PORTABLE CHEST 1 VIEW COMPARISON:  Portable exam 1312 hours compared 07/24/2017 FINDINGS: Normal heart size, mediastinal contours, and pulmonary vascularity. Diffuse interstitial infiltrates, slightly improved in RIGHT lung though persisting in LEFT lung and in RIGHT upper lobe. No  pleural effusion or pneumothorax. Bones demineralized. IMPRESSION: Persistent diffuse BILATERAL interstitial infiltrates question multifocal pneumonia, slightly improved on RIGHT. Electronically Signed   By: Lavonia Dana  M.D.   On: 07/27/2017 13:34     ASSESSMENT AND PLAN:   76 yo male w/ hx of PTSD, HTN, Depression, Anxiety, Chronic back pain.  Hx of Tobacco abuseWho presented to the hospital due to altered mental status.  1. Altered mental status/encephalopathy-secondary to underlying dementia with anxiety. - much improved and following simple commands. Haldol has been discontinued.  -Appreciate psychiatric input, cont. Seroquel.   2. Acute respiratory failure with hypoxia-secondary to pneumonia and also underlying pulmonary embolism. - patient's O2 requirements were going up today, a low-grade fever. Chest x-ray obtained showing multifocal pneumonia. Suspicion for aspiration therefore we'll get a speech evaluation. Empirically start the patient on IV Unasyn. - Continue Eliquis for the pulmonary embolism. - wean O2 as tolerated.   3. PE - cont. Eliquis.  - no hemoptysis and tolerating anticoagulation well.   4. COPD - no acute exacerbation.  - cont. Dulera, Spiriva, duonebs as needed  5. Hx of A. Fib - rate controlled.  Cont. Amiodarone, Cardizem.  - cont. Eliquis  6. BPH - cont. Flomax.   7. Tobacco abuse - cont. Nicotine patch.   Discussed plan of care with Pt's son over the phone.  Also updated Social Work.   All the records are reviewed and case discussed with Care Management/Social Worker. Management plans discussed with the patient, family and they are in agreement.  CODE STATUS: DNR  DVT Prophylaxis: Eliquis  TOTAL TIME TAKING CARE OF THIS PATIENT: 30 minutes.   POSSIBLE D/C IN 1-2 DAYS, DEPENDING ON CLINICAL CONDITION.   Henreitta Leber M.D on 07/27/2017 at 2:11 PM  Between 7am to 6pm - Pager - [phone]  After 6pm go to [email or web address] - Proofreader  [facility]  Office  [phone]  CC: Primary care physician; Rusty Aus, MD

## 2017-07-27 NOTE — Clinical Social Work Note (Signed)
Patient's discharge cancelled for today due to patient's respiratory status and worsening CXR. Patient's nurse to notify patient's son. CSW has updated [facility]. Shela Leff MSW,LCSW [phone]

## 2017-07-27 NOTE — Progress Notes (Signed)
PT Cancellation Note  Patient Details Name: Ricardo Erickson MRN: [id number] DOB: 1941-10-30   Cancelled Treatment:    Reason Eval/Treat Not Completed: Patient not medically ready   Chart reviewed.  Potassium noted 2.7.  Will hold per therapy protocols and continue as appropriate.   Chesley Noon 07/27/2017, 8:13 AM

## 2017-07-27 NOTE — Progress Notes (Signed)
Critical Lab Value K+ 2.7, Text paged on call MD, Dr. Ara Kussmaul. No orders/response at this time. Will continue to monitor. pnt resting and appears comfortable.

## 2017-07-27 NOTE — Progress Notes (Signed)
Patient hypoxic with 02 SATs 92% on 4L, low grade fever of 99.2 orally, and patient coughs when drinking thin liquids.  Dr. Ferne Reus notified.  Nurse will continue to monitor closely.

## 2017-07-27 NOTE — Progress Notes (Signed)
Pharmacy Antibiotic Note  Ricardo Erickson is a 76 y.o. male admitted on 07/19/2017 with aspiration pneumonia.  Pharmacy has been consulted for Unasyn (amoxicillin/sulbactam) dosing.  Plan: Will begin Unasyn 3 gram IV q6h.    Height: 5\' 9"  (175.3 cm) Weight: 205 lb 11.2 oz (93.3 kg) IBW/kg (Calculated) : 70.7  Temp (24hrs), Avg:98.3 F (36.8 C), Min:97.9 F (36.6 C), Max:99.1 F (37.3 C)   Recent Labs Lab 07/22/17 0358 07/23/17 0727 07/24/17 0346 07/25/17 0353 07/26/17 0352 07/27/17 0335  WBC 14.7* 15.3* 14.6*  --  15.0* 13.4*  CREATININE 0.99 0.92 0.83 1.01 1.03 0.96    Estimated Creatinine Clearance: 76.1 mL/min (by C-G formula based on SCr of 0.96 mg/dL).    No Known Allergies  Antimicrobials this admission: Azith 8/10 >> 8/12 CTX  8/8-8/11 and 8/13 >> 8/15 Zosyn 8/11 >> 8/13 Vanc 8/12 >>8/13 Unasyn 8/16 >>    Dose adjustments this admission:    Microbiology results: 8/12 BCx: NGx4d 8/8 UCx: multiple species    Sputum:    8/12 MRSA PCR: negative 8/16 CXR: Persistent diffuse BILATERAL interstitial infiltrates question multifocal pneumonia, slightly improved on RIGHT.  Thank you for allowing pharmacy to be a part of this patient's care.  Alyxandra Tenbrink A 07/27/2017 2:45 PM

## 2017-07-28 ENCOUNTER — Inpatient Hospital Stay: Payer: Medicare Other

## 2017-07-28 DIAGNOSIS — I4891 Unspecified atrial fibrillation: Secondary | ICD-10-CM | POA: Diagnosis not present

## 2017-07-28 DIAGNOSIS — J9601 Acute respiratory failure with hypoxia: Secondary | ICD-10-CM | POA: Diagnosis not present

## 2017-07-28 DIAGNOSIS — F329 Major depressive disorder, single episode, unspecified: Secondary | ICD-10-CM | POA: Diagnosis not present

## 2017-07-28 DIAGNOSIS — R0602 Shortness of breath: Secondary | ICD-10-CM | POA: Diagnosis not present

## 2017-07-28 DIAGNOSIS — I2699 Other pulmonary embolism without acute cor pulmonale: Secondary | ICD-10-CM | POA: Diagnosis not present

## 2017-07-28 DIAGNOSIS — J449 Chronic obstructive pulmonary disease, unspecified: Secondary | ICD-10-CM | POA: Diagnosis not present

## 2017-07-28 DIAGNOSIS — F4322 Adjustment disorder with anxiety: Secondary | ICD-10-CM | POA: Diagnosis not present

## 2017-07-28 DIAGNOSIS — I110 Hypertensive heart disease with heart failure: Secondary | ICD-10-CM | POA: Diagnosis not present

## 2017-07-28 DIAGNOSIS — R21 Rash and other nonspecific skin eruption: Secondary | ICD-10-CM | POA: Diagnosis not present

## 2017-07-28 DIAGNOSIS — J439 Emphysema, unspecified: Secondary | ICD-10-CM | POA: Diagnosis not present

## 2017-07-28 DIAGNOSIS — S32050D Wedge compression fracture of fifth lumbar vertebra, subsequent encounter for fracture with routine healing: Secondary | ICD-10-CM | POA: Diagnosis not present

## 2017-07-28 DIAGNOSIS — J189 Pneumonia, unspecified organism: Secondary | ICD-10-CM | POA: Diagnosis not present

## 2017-07-28 DIAGNOSIS — F4310 Post-traumatic stress disorder, unspecified: Secondary | ICD-10-CM | POA: Diagnosis not present

## 2017-07-28 DIAGNOSIS — R4182 Altered mental status, unspecified: Secondary | ICD-10-CM | POA: Diagnosis not present

## 2017-07-28 DIAGNOSIS — Z4789 Encounter for other orthopedic aftercare: Secondary | ICD-10-CM | POA: Diagnosis not present

## 2017-07-28 DIAGNOSIS — Z7401 Bed confinement status: Secondary | ICD-10-CM | POA: Diagnosis not present

## 2017-07-28 DIAGNOSIS — I428 Other cardiomyopathies: Secondary | ICD-10-CM | POA: Diagnosis not present

## 2017-07-28 DIAGNOSIS — M5136 Other intervertebral disc degeneration, lumbar region: Secondary | ICD-10-CM | POA: Diagnosis not present

## 2017-07-28 LAB — CULTURE, BLOOD (ROUTINE X 2)
CULTURE: NO GROWTH
Culture: NO GROWTH

## 2017-07-28 LAB — BASIC METABOLIC PANEL
ANION GAP: 6 (ref 5–15)
BUN: 17 mg/dL (ref 6–20)
CALCIUM: 8.1 mg/dL — AB (ref 8.9–10.3)
CO2: 28 mmol/L (ref 22–32)
Chloride: 106 mmol/L (ref 101–111)
Creatinine, Ser: 1.06 mg/dL (ref 0.61–1.24)
GFR calc Af Amer: >60 mL/min (ref >60)
GLUCOSE: 118 mg/dL — AB (ref 65–99)
POTASSIUM: 3.2 mmol/L — AB (ref 3.5–5.1)
Sodium: 140 mmol/L (ref 135–145)

## 2017-07-28 LAB — CBC
HEMATOCRIT: 40.2 % (ref 40.0–52.0)
Hemoglobin: 13.4 g/dL (ref 13.0–18.0)
MCH: 30.4 pg (ref 26.0–34.0)
MCHC: 33.2 g/dL (ref 32.0–36.0)
MCV: 91.3 fL (ref 80.0–100.0)
PLATELETS: 277 10*3/uL (ref 150–440)
RBC: 4.41 MIL/uL (ref 4.40–5.90)
RDW: 14.3 % (ref 11.5–14.5)
WBC: 13.6 10*3/uL — ABNORMAL HIGH (ref 3.8–10.6)

## 2017-07-28 MED ORDER — POTASSIUM CHLORIDE CRYS ER 10 MEQ PO TBCR: 10.0000 meq | EXTENDED_RELEASE_TABLET | Freq: Every day | ORAL | Status: DC

## 2017-07-28 MED ORDER — LEVOFLOXACIN 500 MG PO TABS: 500.0000 mg | ORAL_TABLET | Freq: Every day | ORAL | 0 refills | Status: AC

## 2017-07-28 MED ORDER — POTASSIUM CHLORIDE CRYS ER 20 MEQ PO TBCR
40.0000 meq | EXTENDED_RELEASE_TABLET | Freq: Once | ORAL | Status: AC
  Administered 2017-07-28: 40 meq via ORAL
  Filled 2017-07-28: qty 2

## 2017-07-28 NOTE — Clinical Social Work Note (Signed)
Patient is able to discharge today. CSW sent updated discharge information to [facility] and [place] [facility] is aware. Physician notified patient's son. CSW was sitting beside physician when he informed the son and told him that the CSW was aware and done with everything that needed to be completed.  Shela Leff MSW,LCSW [phone]

## 2017-07-28 NOTE — Progress Notes (Signed)
Report given to Levada Dy at [facility]. EMS will be called for transport.

## 2017-07-28 NOTE — Progress Notes (Signed)
[facility] made a follow up visit. Pt was awake and alert. Pt stated that it has been a hard road, but is feeling better and hopes to discharge soon. Pt stated he has been here so long, it is starting to feel like home. Pt does not want this to be his home! [place] provided a short prayer and is available for follow up as needed.    07/28/17 1100  Clinical Encounter Type  Visited With Patient  Visit Type Follow-up;Spiritual support  Consult/Referral To Chaplain

## 2017-07-28 NOTE — Progress Notes (Signed)
Son, [name] updated about transportation. IV removed. Discharge packet given to pt.

## 2017-07-28 NOTE — Progress Notes (Signed)
Pharmacy Antibiotic Note  Ricardo Erickson is a 76 y.o. male admitted on 07/19/2017 with aspiration pneumonia.  Pharmacy has been consulted for Unasyn (amoxicillin/sulbactam) dosing.  Plan: Day 2- Unasyn 3 gram IV q6h.    Height: 5\' 9"  (175.3 cm) Weight: 205 lb 11.2 oz (93.3 kg) IBW/kg (Calculated) : 70.7  Temp (24hrs), Avg:98.4 F (36.9 C), Min:98.1 F (36.7 C), Max:99.1 F (37.3 C)   Recent Labs Lab 07/23/17 0727 07/24/17 0346 07/25/17 0353 07/26/17 0352 07/27/17 0335 07/28/17 0354  WBC 15.3* 14.6*  --  15.0* 13.4* 13.6*  CREATININE 0.92 0.83 1.01 1.03 0.96 1.06    Estimated Creatinine Clearance: 68.9 mL/min (by C-G formula based on SCr of 1.06 mg/dL).    No Known Allergies  Antimicrobials this admission: Azith 8/10 >> 8/12 CTX  8/8-8/11 and 8/13 >> 8/15 Zosyn 8/11 >> 8/13 Vanc 8/12 >>8/13 Unasyn 8/16 >>    Dose adjustments this admission:    Microbiology results: 8/12 BCx: NGx4d 8/8 UCx: multiple species    Sputum:    8/12 MRSA PCR: negative 8/16 CXR: Persistent diffuse BILATERAL interstitial infiltrates question multifocal pneumonia, slightly improved on RIGHT.  Thank you for allowing pharmacy to be a part of this patient's care.  Poonam Woehrle A 07/28/2017 11:58 AM

## 2017-07-28 NOTE — Evaluation (Signed)
Clinical/Bedside Swallow Evaluation Patient Details  Name: Ricardo Erickson MRN: [id number] Date of Birth: [birth year]  Today's Date: 07/28/2017 Time: SLP Start Time (ACUTE ONLY): 1240 SLP Stop Time (ACUTE ONLY): 1340 SLP Time Calculation (min) (ACUTE ONLY): 60 min  Past Medical History:  Past Medical History:  Diagnosis Date  . Chronic back pain   . Depression   . Emphysema lung ([place])   . Hypertension   . Personal history of tobacco use, presenting hazards to health 08/06/2015  . Pollen allergies   . PTSD (post-traumatic stress disorder)    Past Surgical History:  Past Surgical History:  Procedure Laterality Date  . BACK SURGERY    . KYPHOPLASTY N/A 06/27/2017   Procedure: KYPHOPLASTY -L5;  Surgeon: Hessie Knows, MD;  Location: [facility] ORS;  Service: Orthopedics;  Laterality: N/A;  . TONSILLECTOMY AND ADENOIDECTOMY  1980   HPI:  Pt is a 76 y.o. male who presents with altered mental status, weakness; pt admitted w/ UTI but also determined to have a PE and pneumonia during this admission. Patient recently had kyphoplasty done after L5 compression fracture. He was in rehabilitation for a few days afterwards, and then went home because he did not want to see the rehabilitation facility. However, his physical status has deteriorated some since that time. Tonight he was found to have a UTI in the ED, potentially explaining his weakness and confusion. Pt has a known history of Chronic back pain and Emphysema along with ongoing tobacco abuse, hypertension, depression, PTSD, HTN, moderate Anxiety on medication, previous h/o ETOH abuse. per chart note. For the past few days, pt has been taking more sedating medications and felt he could not sit up and had to lie on his back d/t the back pain. Per family report, pt has been drinking and eating frequently reclined in the bed, and using a straw to drink liquids with. The food has been chopped more for easier mastication d/t pt missing his dentures currently.  NSG stated concern w/ drinking liquids in a more sedated state, not fully upright. The concern also centered around pill swallowing. Pt has been declining most foods but prefers to eat cookies, ice cream w/out Ensure added to it. He stated the food has no "flavor".    Assessment / Plan / Recommendation Clinical Impression  Pt appeared to present w/ adequate oropharyngeal phase swallow function when following general aspiration precautions including small, single sips VIA CUP - NO Straws and less talking during oral intake. Pt also educated/instructed on the importance of sitting fully upright supported comfortably w/ pillows lower behind back - NOT Reclined. Pt consumed po trials of thin liquids, soft solids, and cookies w/ no overt s/s of aspiration noted; no decline in respiratory effort noted; and clear vocal quality followed po trials when assessed. Oral phase appeared wfl for bolus management and clearing w/ min extra time needed for gumming cookies d/t edentulous status. Pt was instructed on the small, single sips and no talking intermittently; he consumed 12+ ozs of Coke via Cup. Instructed pt and family member present on general aspiration precautions w/ all oral intake. He fed self but stated he was "not interested" in the meal. Recommend a modified diet to ease exertion; Dysphagia level 3 w/ thin liquids. Recommend f/u w/ Dietician for supplement and diet options. Pt and family member educated on the importance of NOT taking a "handfull of Pills" at one time but to take only 1-3(or fewer) for better oral control. Also discussed the importance of NOT  being sleepy or lethargic by medication when eating/drinking d/t the aspiration risk. NSG updated. Of note, an incentive spirometer was recommended if appropriate for pt per MD while pt is more sedentary.  SLP Visit Diagnosis: Dysphagia, oropharyngeal phase (R13.12)    Aspiration Risk   (reduced following general precautions)    Diet Recommendation   Dysphagia level 3(Mech Soft) moistened well for easier mastication/gumming; Thin liquids - NO STRAWS.  General aspiration precautions - do NOT eat if sleepy or reclined  Medication Administration: Whole meds with liquid (1-3 pills or fewer at a time)    Other  Recommendations Recommended Consults:  (Dietician f/u) Oral Care Recommendations: Oral care BID;Patient independent with oral care;Staff/trained caregiver to provide oral care   Follow up Recommendations None      Frequency and Duration  n/a         Prognosis Prognosis for Safe Diet Advancement: Good Barriers to Reach Goals: Cognitive deficits      Swallow Study   General Date of Onset: 07/19/17 HPI: Pt is a 76 y.o. male who presents with altered mental status, weakness; pt admitted w/ UTI but also determined to have a PE and pneumonia during this admission. Patient recently had kyphoplasty done after L5 compression fracture. He was in rehabilitation for a few days afterwards, and then went home because he did not want to see the rehabilitation facility. However, his physical status has deteriorated some since that time. Tonight he was found to have a UTI in the ED, potentially explaining his weakness and confusion. Pt has a known history of Chronic back pain and Emphysema along with ongoing tobacco abuse, hypertension, depression, PTSD, HTN, moderate Anxiety on medication, previous h/o ETOH abuse. per chart note. For the past few days, pt has been taking more sedating medications and felt he could not sit up and had to lie on his back d/t the back pain. Per family report, pt has been drinking and eating frequently reclined in the bed, and using a straw to drink liquids with. The food has been chopped more for easier mastication d/t pt missing his dentures currently. NSG stated concern w/ drinking liquids in a more sedated state, not fully upright. The concern also centered around pill swallowing. Pt has been declining most foods but  prefers to eat cookies, ice cream w/out Ensure added to it. He stated the food has no "flavor".  Type of Study: Bedside Swallow Evaluation Previous Swallow Assessment: none noted during admissions Diet Prior to this Study: Dysphagia 2 (chopped);Thin liquids (food chopped for easier eating post back issues last) Temperature Spikes Noted: No (wbc min elevated) Respiratory Status: Nasal cannula (1-2 liters (pt took off currently)) Behavior/Cognition: Alert;Cooperative;Pleasant mood;Distractible;Requires cueing (encephalapathy dx'd) Oral Cavity Assessment: Within Functional Limits Oral Care Completed by SLP: Recent completion by staff Oral Cavity - Dentition: Edentulous (does not have dentures at this time) Vision: Functional for self-feeding Self-Feeding Abilities: Able to feed self;Needs assist;Needs set up Patient Positioning: Upright in bed (pt supported forwad w/ pills; raised bed upright) Baseline Vocal Quality: Normal (quick talker) Volitional Cough: Strong Volitional Swallow: Able to elicit    Oral/Motor/Sensory Function Overall Oral Motor/Sensory Function: Within functional limits   Ice Chips Ice chips: Not tested   Thin Liquid Thin Liquid: Within functional limits (Coke preferred) Presentation: Cup;Self Fed (~12+ ozs) Other Comments: educated pt on NOT talking while drinking/eating    Nectar Thick Nectar Thick Liquid: Not tested   Honey Thick Honey Thick Liquid: Not tested   Puree Puree: Within  functional limits Presentation: Self Fed;Spoon (3-4 trials)   Solid   GO   Solid: Impaired Presentation: Self Fed (3 cookies) Oral Phase Impairments: Impaired mastication (edentulous status baseline) Oral Phase Functional Implications: Impaired mastication (min longer time) Pharyngeal Phase Impairments:  (none) Other Comments: took sips of Coke to moisten cookies         Orinda Kenner, MS, CCC-SLP Watson,Katherine 07/28/2017,3:12 PM

## 2017-07-28 NOTE — Clinical Social Work Placement (Signed)
   CLINICAL SOCIAL WORK PLACEMENT  NOTE  Date:  07/28/2017  Patient Details  Name: Ricardo Erickson MRN: [id number] Date of Birth: Nov 14, 1941  Clinical Social Work is seeking post-discharge placement for this patient at the [place] level of care (*CSW will initial, date and re-position this form in  chart as items are completed):  Yes   Patient/family provided with [place] Work Department's list of facilities offering this level of care within the geographic area requested by the patient (or if unable, by the patient's family).  Yes   Patient/family informed of their freedom to choose among providers that offer the needed level of care, that participate in Medicare, Medicaid or managed care program needed by the patient, have an available bed and are willing to accept the patient.  Yes   Patient/family informed of [facility]'s ownership interest in [facility] and [facility], as well as of the fact that they are under no obligation to receive care at these facilities.  PASRR submitted to EDS on       PASRR number received on       Existing PASRR number confirmed on 07/20/17     FL2 transmitted to all facilities in geographic area requested by pt/family on 07/20/17     FL2 transmitted to all facilities within larger geographic area on       Patient informed that his/her managed care company has contracts with or will negotiate with certain facilities, including the following:        Yes   Patient/family informed of bed offers received.  Patient chooses bed at  [facility] )     Physician recommends and patient chooses bed at  [facility])    Patient to be transferred to  [facility]) on 07/28/17.  Patient to be transferred to facility by  (EMS)     Patient family notified on 07/28/17 of transfer.  Name of family member notified:   (Son)     PHYSICIAN       Additional Comment:     _______________________________________________ Shela Leff, LCSW 07/28/2017, 2:07 PM

## 2017-07-31 DIAGNOSIS — I110 Hypertensive heart disease with heart failure: Secondary | ICD-10-CM | POA: Diagnosis not present

## 2017-07-31 DIAGNOSIS — I4891 Unspecified atrial fibrillation: Secondary | ICD-10-CM | POA: Diagnosis not present

## 2017-07-31 DIAGNOSIS — S32050D Wedge compression fracture of fifth lumbar vertebra, subsequent encounter for fracture with routine healing: Secondary | ICD-10-CM | POA: Diagnosis not present

## 2017-07-31 DIAGNOSIS — J189 Pneumonia, unspecified organism: Secondary | ICD-10-CM | POA: Diagnosis not present

## 2017-07-31 DIAGNOSIS — J439 Emphysema, unspecified: Secondary | ICD-10-CM | POA: Diagnosis not present

## 2017-07-31 DIAGNOSIS — I2699 Other pulmonary embolism without acute cor pulmonale: Secondary | ICD-10-CM | POA: Diagnosis not present

## 2017-07-31 DIAGNOSIS — J449 Chronic obstructive pulmonary disease, unspecified: Secondary | ICD-10-CM | POA: Diagnosis not present

## 2017-07-31 DIAGNOSIS — M5136 Other intervertebral disc degeneration, lumbar region: Secondary | ICD-10-CM | POA: Diagnosis not present

## 2017-08-01 ENCOUNTER — Ambulatory Visit: Payer: Medicare Other | Admitting: Primary Care

## 2017-08-02 ENCOUNTER — Ambulatory Visit: Payer: Self-pay

## 2017-08-15 DIAGNOSIS — R21 Rash and other nonspecific skin eruption: Secondary | ICD-10-CM | POA: Diagnosis not present

## 2017-08-15 DIAGNOSIS — I428 Other cardiomyopathies: Secondary | ICD-10-CM | POA: Diagnosis not present

## 2017-08-23 DIAGNOSIS — I5023 Acute on chronic systolic (congestive) heart failure: Secondary | ICD-10-CM | POA: Diagnosis not present

## 2017-08-23 DIAGNOSIS — R748 Abnormal levels of other serum enzymes: Secondary | ICD-10-CM | POA: Diagnosis not present

## 2017-08-23 DIAGNOSIS — I480 Paroxysmal atrial fibrillation: Secondary | ICD-10-CM | POA: Diagnosis not present

## 2017-08-23 DIAGNOSIS — I10 Essential (primary) hypertension: Secondary | ICD-10-CM | POA: Diagnosis not present

## 2017-09-04 DIAGNOSIS — F4322 Adjustment disorder with anxiety: Secondary | ICD-10-CM | POA: Diagnosis not present

## 2017-09-04 DIAGNOSIS — R7303 Prediabetes: Secondary | ICD-10-CM | POA: Diagnosis not present

## 2017-09-04 DIAGNOSIS — I509 Heart failure, unspecified: Secondary | ICD-10-CM | POA: Diagnosis not present

## 2017-09-04 DIAGNOSIS — I110 Hypertensive heart disease with heart failure: Secondary | ICD-10-CM | POA: Diagnosis not present

## 2017-09-04 DIAGNOSIS — J439 Emphysema, unspecified: Secondary | ICD-10-CM | POA: Diagnosis not present

## 2017-09-04 DIAGNOSIS — M5136 Other intervertebral disc degeneration, lumbar region: Secondary | ICD-10-CM | POA: Diagnosis not present

## 2017-09-04 DIAGNOSIS — S32050D Wedge compression fracture of fifth lumbar vertebra, subsequent encounter for fracture with routine healing: Secondary | ICD-10-CM | POA: Diagnosis not present

## 2017-09-04 DIAGNOSIS — F329 Major depressive disorder, single episode, unspecified: Secondary | ICD-10-CM | POA: Diagnosis not present

## 2017-09-04 DIAGNOSIS — F4310 Post-traumatic stress disorder, unspecified: Secondary | ICD-10-CM | POA: Diagnosis not present

## 2017-09-04 DIAGNOSIS — I4891 Unspecified atrial fibrillation: Secondary | ICD-10-CM | POA: Diagnosis not present

## 2017-09-08 DIAGNOSIS — I428 Other cardiomyopathies: Secondary | ICD-10-CM | POA: Diagnosis not present

## 2017-09-08 DIAGNOSIS — I480 Paroxysmal atrial fibrillation: Secondary | ICD-10-CM | POA: Diagnosis not present

## 2017-09-08 DIAGNOSIS — I5023 Acute on chronic systolic (congestive) heart failure: Secondary | ICD-10-CM | POA: Diagnosis not present

## 2017-09-12 DIAGNOSIS — S32050D Wedge compression fracture of fifth lumbar vertebra, subsequent encounter for fracture with routine healing: Secondary | ICD-10-CM | POA: Diagnosis not present

## 2017-09-12 DIAGNOSIS — I10 Essential (primary) hypertension: Secondary | ICD-10-CM | POA: Diagnosis not present

## 2017-09-12 DIAGNOSIS — J439 Emphysema, unspecified: Secondary | ICD-10-CM | POA: Diagnosis not present

## 2017-09-12 DIAGNOSIS — M5136 Other intervertebral disc degeneration, lumbar region: Secondary | ICD-10-CM | POA: Diagnosis not present

## 2017-09-28 DIAGNOSIS — I480 Paroxysmal atrial fibrillation: Secondary | ICD-10-CM | POA: Diagnosis not present

## 2017-09-28 DIAGNOSIS — I5023 Acute on chronic systolic (congestive) heart failure: Secondary | ICD-10-CM | POA: Diagnosis not present

## 2017-09-28 DIAGNOSIS — M5116 Intervertebral disc disorders with radiculopathy, lumbar region: Secondary | ICD-10-CM | POA: Diagnosis not present

## 2017-10-01 ENCOUNTER — Encounter: Payer: Self-pay | Admitting: Emergency Medicine

## 2017-10-01 ENCOUNTER — Emergency Department
Admission: EM | Admit: 2017-10-01 | Discharge: 2017-10-01 | Disposition: A | Discharge status: Home / Self Care | Payer: Medicare Other | Attending: Emergency Medicine | Admitting: Emergency Medicine

## 2017-10-01 DIAGNOSIS — Z8546 Personal history of malignant neoplasm of prostate: Secondary | ICD-10-CM | POA: Diagnosis not present

## 2017-10-01 DIAGNOSIS — Z7901 Long term (current) use of anticoagulants: Secondary | ICD-10-CM | POA: Diagnosis not present

## 2017-10-01 DIAGNOSIS — F17200 Nicotine dependence, unspecified, uncomplicated: Secondary | ICD-10-CM | POA: Insufficient documentation

## 2017-10-01 DIAGNOSIS — R310 Gross hematuria: Secondary | ICD-10-CM

## 2017-10-01 DIAGNOSIS — I10 Essential (primary) hypertension: Secondary | ICD-10-CM | POA: Insufficient documentation

## 2017-10-01 DIAGNOSIS — J449 Chronic obstructive pulmonary disease, unspecified: Secondary | ICD-10-CM | POA: Insufficient documentation

## 2017-10-01 DIAGNOSIS — R319 Hematuria, unspecified: Secondary | ICD-10-CM | POA: Insufficient documentation

## 2017-10-01 DIAGNOSIS — Z79899 Other long term (current) drug therapy: Secondary | ICD-10-CM | POA: Insufficient documentation

## 2017-10-01 LAB — CBC
HCT: 39.9 % — ABNORMAL LOW (ref 40.0–52.0)
HEMOGLOBIN: 13.2 g/dL (ref 13.0–18.0)
MCH: 30 pg (ref 26.0–34.0)
MCHC: 33.1 g/dL (ref 32.0–36.0)
MCV: 90.7 fL (ref 80.0–100.0)
PLATELETS: 285 10*3/uL (ref 150–440)
RBC: 4.4 MIL/uL (ref 4.40–5.90)
RDW: 15.7 % — ABNORMAL HIGH (ref 11.5–14.5)
WBC: 14.2 10*3/uL — AB (ref 3.8–10.6)

## 2017-10-01 LAB — BASIC METABOLIC PANEL
ANION GAP: 5 (ref 5–15)
BUN: 14 mg/dL (ref 6–20)
CHLORIDE: 104 mmol/L (ref 101–111)
CO2: 29 mmol/L (ref 22–32)
Calcium: 9 mg/dL (ref 8.9–10.3)
Creatinine, Ser: 1.43 mg/dL — ABNORMAL HIGH (ref 0.61–1.24)
GFR calc non Af Amer: 47 mL/min — ABNORMAL LOW (ref >60)
GFR, EST AFRICAN AMERICAN: 54 mL/min — AB (ref >60)
Glucose, Bld: 102 mg/dL — ABNORMAL HIGH (ref 65–99)
POTASSIUM: 3.4 mmol/L — AB (ref 3.5–5.1)
SODIUM: 138 mmol/L (ref 135–145)

## 2017-10-01 LAB — URINALYSIS, COMPLETE (UACMP) WITH MICROSCOPIC
BACTERIA UA: NONE SEEN
SQUAMOUS EPITHELIAL / LPF: NONE SEEN
Specific Gravity, Urine: 1.014 (ref 1.005–1.030)

## 2017-10-01 MED ORDER — CEPHALEXIN 500 MG PO CAPS: 500.0000 mg | ORAL_CAPSULE | Freq: Two times a day (BID) | ORAL | 0 refills | Status: DC

## 2017-10-01 MED ORDER — CEPHALEXIN 500 MG PO CAPS
500.0000 mg | ORAL_CAPSULE | Freq: Once | ORAL | Status: AC
  Administered 2017-10-01: 500 mg via ORAL
  Filled 2017-10-01: qty 1

## 2017-10-01 NOTE — ED Triage Notes (Signed)
Patient presents to the ED with hematuria that he noticed this morning.  Patient reports dysuria for the past few days.  Patient is sometimes incontinent due to an enlarged prostate and he wears an incontinence brief all the time.  Patient reports history of chronic issues such as COPD and CHF but states everything is somewhat under control at this time.

## 2017-10-01 NOTE — ED Provider Notes (Signed)
[facility] Emergency Department Provider Note   ____________________________________________   First MD Initiated Contact with Patient 10/01/17 1500     (approximate)  I have reviewed the triage vital signs and the nursing notes.   HISTORY  Chief Complaint Hematuria    HPI Ricardo Erickson is a 76 y.o. male for evaluation of noting blood in his urine this morning  Patient does take a blood thinner, he has a history of prostate disease as well, has previously had to have a catheter, and reports he started having slight burning sensation with urination yesterday, and noticed this morning that he started having blood in his urine.  Describes the blood is looking like "Kool-Aid".  No nausea or vomiting.  No abdominal pain.  Does not feel like his bladder is blocked.  He is incontinent, but this is been a chronic issue.  He and his son are here, they report that he has an appointment tomorrow morning with urology Dr. Junious Silk regarding concerns of his ongoing incontinence and prostate issues  Past Medical History:  Diagnosis Date  . Chronic back pain   . Depression   . Emphysema lung ([place])   . Hypertension   . Personal history of tobacco use, presenting hazards to health 08/06/2015  . Pollen allergies   . PTSD (post-traumatic stress disorder)     Patient Active Problem List   Diagnosis Date Noted  . Tobacco use disorder 07/24/2017  . Encephalopathy   . Palliative care encounter   . Goals of care, counseling/discussion   . Pulmonary embolus ([place])   . UTI (urinary tract infection) 07/19/2017  . PTSD (post-traumatic stress disorder) 06/28/2017  . Adjustment disorder with anxiety 06/28/2017  . Closed compression fracture of L5 lumbar vertebra ([place])   . Left low back pain   . Weakness   . A-fib ([place]) 06/24/2017  . Prediabetes 01/18/2017  . Prostate cancer ([place]) 01/18/2017  . Anxiety 01/18/2017  . Preventative health care 04/15/2016  . Restless leg  syndrome 04/03/2016  . COPD (chronic obstructive pulmonary disease) ([place]) 04/03/2016  . BPH (benign prostatic hyperplasia) 04/03/2016  . Personal history of tobacco use, presenting hazards to health 08/06/2015    Past Surgical History:  Procedure Laterality Date  . BACK SURGERY    . KYPHOPLASTY N/A 06/27/2017   Procedure: KYPHOPLASTY -L5;  Surgeon: Hessie Knows, MD;  Location: [facility] ORS;  Service: Orthopedics;  Laterality: N/A;  . [place]    Prior to Admission medications   Medication Sig Start Date End Date Taking? Authorizing Provider  acetaminophen (TYLENOL) 650 MG CR tablet Take 650 mg by mouth every 8 (eight) hours as needed for pain.    [provider]  albuterol (PROVENTIL HFA;VENTOLIN HFA) 108 (90 Base) MCG/ACT inhaler Inhale 2 puffs into the lungs as needed for wheezing or shortness of breath.    [provider]  albuterol (PROVENTIL) (2.5 MG/3ML) 0.083% nebulizer solution Inhale 2.5 mg into the lungs every 6 (six) hours as needed.  01/15/15   [provider]  amiodarone (PACERONE) 200 MG tablet Take 1 tablet (200 mg total) by mouth daily. 07/28/17   Henreitta Leber, MD  apixaban (ELIQUIS) 5 MG TABS tablet Take 10 mg PO BID X 4 days then take 5 mg PO BID 07/27/17   Sainani, Belia Heman, MD  busPIRone (BUSPAR) 7.5 MG tablet Take 1 tablet twice daily for anxiety. 06/19/17   Pleas Koch, NP  cephALEXin (KEFLEX) 500 MG capsule  Take 1 capsule (500 mg total) by mouth 2 (two) times daily. 10/01/17   Delman Kitten, MD  cyclobenzaprine (FLEXERIL) 5 MG tablet Take 5 mg by mouth 2 (two) times daily as needed for muscle spasms. 07/18/17   [provider]  digoxin (LANOXIN) 0.125 MG tablet Take 1 tablet (0.125 mg total) by mouth daily. 06/30/17   Dustin Flock, MD  diltiazem (CARDIZEM CD) 240 MG 24 hr capsule Take 1 capsule (240 mg total) by mouth daily. 06/30/17   Dustin Flock, MD  Fluticasone-Salmeterol (ADVAIR DISKUS) 250-50  MCG/DOSE AEPB Inhale 1 puff into the lungs every 12 (twelve) hours. Reported on 04/01/2016    [provider]  furosemide (LASIX) 20 MG tablet Take 20 mg by mouth daily. 07/17/17 07/17/18  [provider]  lidocaine (LIDODERM) 5 % Place 1 patch onto the skin daily. Remove & Discard patch within 12 hours or as directed by MD 07/27/17   Henreitta Leber, MD  polyethylene glycol (MIRALAX / GLYCOLAX) packet Take 17 g by mouth daily as needed for mild constipation. Patient not taking: Reported on 07/19/2017 06/29/17   Dustin Flock, MD  potassium chloride SA (K-DUR,KLOR-CON) 10 MEQ tablet Take 1 tablet (10 mEq total) by mouth daily. 07/28/17   Henreitta Leber, MD  pramipexole (MIRAPEX) 1 MG tablet Take 1 mg by mouth at bedtime.    [provider]  QUEtiapine (SEROQUEL) 25 MG tablet Take 1 tablet (25 mg total) by mouth at bedtime. 07/27/17   Henreitta Leber, MD  tamsulosin (FLOMAX) 0.4 MG CAPS capsule Take 0.4 mg by mouth daily.    [provider]  tiotropium (SPIRIVA) 18 MCG inhalation capsule Place 18 mcg into inhaler and inhale daily.    [provider]  traMADol (ULTRAM) 50 MG tablet Take 1 tablet (50 mg total) by mouth every 12 (twelve) hours as needed. 07/27/17   Henreitta Leber, MD    Allergies Patient has no known allergies.  Family History  Problem Relation Age of Onset  . Alcohol abuse Father   . Arthritis Mother   . Heart disease Maternal Grandmother   . Prostate cancer Brother     Social History Social History  Substance Use Topics  . Smoking status: Current Every Day Smoker    Packs/day: 1.00    Years: 60.00  . Smokeless tobacco: Never Used  . Alcohol use No    Review of Systems Constitutional: No fever/chills Eyes: No visual changes. ENT: No sore throat. Cardiovascular: Denies chest pain. Respiratory: Denies shortness of breath. Gastrointestinal: No abdominal pain.  No nausea, no vomiting.  No diarrhea.  No  constipation. Genitourinary: See the HPI Musculoskeletal: Negative for back pain. Skin: Negative for rash. Neurological: Negative for headaches, focal weakness or numbness.    ____________________________________________   PHYSICAL EXAM:  VITAL SIGNS: ED Triage Vitals  Enc Vitals Group     BP 10/01/17 1148 (!) 151/81     Pulse Rate 10/01/17 1148 (!) 52     Resp 10/01/17 1148 18     Temp 10/01/17 1148 (!) 97.3 F (36.3 C)     Temp Source 10/01/17 1148 Oral     SpO2 10/01/17 1148 97 %     Weight 10/01/17 1149 180 lb (81.6 kg)     Height 10/01/17 1149 5\' 9"  (1.753 m)     Head Circumference --      Peak Flow --      Pain Score 10/01/17 1148 3     Pain  Loc --      Pain Edu? --      Excl. in [place]? --     Constitutional: Alert and oriented. Well appearing and in no acute distress. Eyes: Conjunctivae are normal. Head: Atraumatic. Nose: No congestion/rhinnorhea. Mouth/Throat: Mucous membranes are moist. Neck: No stridor.   Cardiovascular: Normal rate, regular rhythm. Grossly normal heart sounds.  Good peripheral circulation. Respiratory: Normal respiratory effort.  No retractions. Lungs CTAB.-Barrel chest  gastrointestinal: Soft and nontender. No distention.  No distention of the bladder. Genitourinary: Normal bilateral testicles, normal circumcised penis.  A very small amount of urine is noted in the patient's diaper at this time with a slight hint of blood tingeing. Musculoskeletal: No lower extremity tenderness nor edema. Neurologic:  Normal speech and language. No gross focal neurologic deficits are appreciated.  Skin:  Skin is warm, dry and intact. No rash noted. Psychiatric: Mood and affect are normal. Speech and behavior are normal.  ____________________________________________   LABS (all labs ordered are listed, but only abnormal results are displayed)  Labs Reviewed  URINALYSIS, COMPLETE (UACMP) WITH MICROSCOPIC - Abnormal; Notable for the following:       Result  Value   Color, Urine RED (*)    APPearance CLOUDY (*)    Glucose, UA   (*)    Value: TEST NOT REPORTED DUE TO COLOR INTERFERENCE OF URINE PIGMENT   Hgb urine dipstick   (*)    Value: TEST NOT REPORTED DUE TO COLOR INTERFERENCE OF URINE PIGMENT   Bilirubin Urine   (*)    Value: TEST NOT REPORTED DUE TO COLOR INTERFERENCE OF URINE PIGMENT   Ketones, ur   (*)    Value: TEST NOT REPORTED DUE TO COLOR INTERFERENCE OF URINE PIGMENT   Protein, ur   (*)    Value: TEST NOT REPORTED DUE TO COLOR INTERFERENCE OF URINE PIGMENT   Nitrite   (*)    Value: TEST NOT REPORTED DUE TO COLOR INTERFERENCE OF URINE PIGMENT   Leukocytes, UA   (*)    Value: TEST NOT REPORTED DUE TO COLOR INTERFERENCE OF URINE PIGMENT   All other components within normal limits  CBC - Abnormal; Notable for the following:    WBC 14.2 (*)    HCT 39.9 (*)    RDW 15.7 (*)    All other components within normal limits  BASIC METABOLIC PANEL - Abnormal; Notable for the following:    Potassium 3.4 (*)    Glucose, Bld 102 (*)    Creatinine, Ser 1.43 (*)    GFR calc non Af Amer 47 (*)    GFR calc Af Amer 54 (*)    All other components within normal limits  URINE CULTURE   ____________________________________________  EKG   ____________________________________________  RADIOLOGY   ____________________________________________   PROCEDURES  Procedure(s) performed: None  Procedures  Critical Care performed: No  ____________________________________________   INITIAL IMPRESSION / ASSESSMENT AND PLAN / ED COURSE  Pertinent labs & imaging results that were available during my care of the patient were reviewed by me and considered in my medical decision making (see chart for details).  Hematuria.  Etiology somewhat unclear, may represent infection given the associated dysuria.  He has an elevated white count but this is most recently noted to be chronic, and he and his son report his white blood cell counts have been  elevated recently.  He denies any systemic symptoms.  He reports he is able to empty the bladder and does not  feel as though it is blocked.  Urinalysis shows white cells and red cells, possibly could represent urinary tract infection was hemorrhagic cystitis versus other etiology such as enlarged prostate, bladder tumor, etc. all considered.  Discussed with patient and family, we will treat him with an antibiotic in the event infection could be responsible, and culture the urine, and he will follow-up with Dr. Junious Silk of urology tomorrow.  Carefully discussed return precautions including signs and symptoms of bladder obstruction in which case he would need to come back right away.  Clinical Course as of Oct 03 55  Nancy Fetter Oct 01, 2017  1533 Creatinine: (!) 1.43 [MQ]  0321 Paged Dr. Junious Silk to discuss. GFR, Est Non African American: (!) 47 [MQ]  1549 This case and care with Dr. Junious Silk.  He advises obtain a post void residual, if greater than 300-400 consider having a Foley catheter placed and then he will follow up the patient tomorrow.  [MQ]    Clinical Course User Index [MQ] Delman Kitten, MD   Discussed inserting a Foley catheter in reason to do so with patient and his son, patient did not wish to have this inserted year but wishes to have it done tomorrow morning with urology.  Discussed and recommended and advised him that urology had recommended this be placed, but patient did not wish for this.  Discussed reassured medical decision-making also discussed that may contribute to worsening of his kidney function, but the patient reports he wishes to have it done at the urology office tomorrow.  ____________________________________________   FINAL CLINICAL IMPRESSION(S) / ED DIAGNOSES  Final diagnoses:  Gross hematuria      NEW MEDICATIONS STARTED DURING THIS VISIT:  Discharge Medication List as of 10/01/2017  4:08 PM    START taking these medications   Details  cephALEXin (KEFLEX) 500  MG capsule Take 1 capsule (500 mg total) by mouth 2 (two) times daily., Starting Sun 10/01/2017, Print         Note:  This document was prepared using Dragon voice recognition software and may include unintentional dictation errors.     Delman Kitten, MD 10/02/17 [phone]

## 2017-10-01 NOTE — Discharge Instructions (Signed)
°  Go to your Urology doctor tomorrow for follow up on today?s ED visit, or return immediately to the ED if your pain worsens, you have decreased urine production, develop fever, persistent vomiting, or other symptoms that concern you.

## 2017-10-01 NOTE — ED Notes (Signed)
Bladder scanner 597. Md notified. Pt refusing foley. Md talked to pt. Pt is going to see urology tomorrow

## 2017-10-02 ENCOUNTER — Ambulatory Visit (INDEPENDENT_AMBULATORY_CARE_PROVIDER_SITE_OTHER): Payer: Medicare Other | Admitting: Urology

## 2017-10-02 ENCOUNTER — Encounter: Payer: Self-pay | Admitting: Urology

## 2017-10-02 VITALS — BP 166/94 | HR 60 | Ht 69.0 in

## 2017-10-02 DIAGNOSIS — R310 Gross hematuria: Secondary | ICD-10-CM

## 2017-10-02 DIAGNOSIS — R3915 Urgency of urination: Secondary | ICD-10-CM | POA: Diagnosis not present

## 2017-10-02 DIAGNOSIS — N401 Enlarged prostate with lower urinary tract symptoms: Secondary | ICD-10-CM

## 2017-10-02 LAB — BLADDER SCAN AMB NON-IMAGING: SCAN RESULT: 319

## 2017-10-02 MED ORDER — FINASTERIDE 5 MG PO TABS: 5.0000 mg | ORAL_TABLET | Freq: Every day | ORAL | 11 refills | Status: DC

## 2017-10-02 NOTE — Progress Notes (Signed)
10/02/2017 11:35 AM   Ricardo Erickson 1941/01/18 [id number]  Referring provider: Rusty Aus, MD [facility] [place], [place] [address]  Chief Complaint  Patient presents with  . Benign Prostatic Hypertrophy    HPI:  76 yo seen for a couple of issues:  1) BPH and LUTS. He c/o urgency and UUI. He tried tamsulosin. Diuretics made it worse. He went to ED last night for gross hematuria also. PVR was 597 ml. Prostate was 115 g on CT Jul 2018. He has a weak stream. This has been going on for a few months. He restarted tamsulosin. NG risk includes back surgery Jul 2018 with a slow recovery requiring PT. He walks slowly.   2) gross hematuria - pt went to ED 10/01/2017 c/o red urine. No clots. No bac seen but tntc wbc and tntc rbc. Bun 14, cr 1.43. (up from 1.06). He underwent CT 06/24/2017 which showed a normal GU tract and a 115 g prostate with a moderately distended bladder. He is a smoker.   3) PSA elevation. Associated with 115 g prostate on imaging.  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  4) 10 mm indeterminate right adrenal nodule - on CT Jul 2018. Rad rec CT or MR Jul 2019.    Today, patient is seen for the above.    PMH: Past Medical History:  Diagnosis Date  . Chronic back pain   . Depression   . Emphysema lung ([place])   . Hypertension   . Personal history of tobacco use, presenting hazards to health 08/06/2015  . Pollen allergies   . PTSD (post-traumatic stress disorder)     Surgical History: Past Surgical History:  Procedure Laterality Date  . BACK SURGERY    . KYPHOPLASTY N/A 06/27/2017   Procedure: KYPHOPLASTY -L5;  Surgeon: Hessie Knows, MD;  Location: [facility] ORS;  Service: Orthopedics;  Laterality: N/A;  . TONSILLECTOMY AND ADENOIDECTOMY  1980    Home Medications:  Allergies as of 10/02/2017   No Known Allergies     Medication List       Accurate as of  10/02/17 11:35 AM. Always use your most recent med list.          acetaminophen 650 MG CR tablet Commonly known as:  TYLENOL Take 650 mg by mouth every 8 (eight) hours as needed for pain.   ADVAIR DISKUS 250-50 MCG/DOSE Aepb Generic drug:  Fluticasone-Salmeterol Inhale 1 puff into the lungs every 12 (twelve) hours. Reported on 04/01/2016   albuterol 108 (90 Base) MCG/ACT inhaler Commonly known as:  PROVENTIL HFA;VENTOLIN HFA Inhale 2 puffs into the lungs as needed for wheezing or shortness of breath.   albuterol (2.5 MG/3ML) 0.083% nebulizer solution Commonly known as:  PROVENTIL Inhale 2.5 mg into the lungs every 6 (six) hours as needed.   amiodarone 200 MG tablet Commonly known as:  PACERONE Take 1 tablet (200 mg total) by mouth daily.   apixaban 5 MG Tabs tablet Commonly known as:  ELIQUIS Take 10 mg PO BID X 4 days then take 5 mg PO BID   busPIRone 7.5 MG tablet Commonly known as:  BUSPAR Take 1 tablet twice daily for anxiety.   cephALEXin 500 MG capsule Commonly known as:  KEFLEX Take 1 capsule (500 mg total) by mouth 2 (two) times daily.   cyclobenzaprine 5 MG tablet Commonly known as:  FLEXERIL Take 5 mg by mouth  2 (two) times daily as needed for muscle spasms.   digoxin 0.125 MG tablet Commonly known as:  LANOXIN Take 1 tablet (0.125 mg total) by mouth daily.   diltiazem 240 MG 24 hr capsule Commonly known as:  CARDIZEM CD Take 1 capsule (240 mg total) by mouth daily.   furosemide 20 MG tablet Commonly known as:  LASIX Take 20 mg by mouth daily.   lidocaine 5 % Commonly known as:  LIDODERM Place 1 patch onto the skin daily. Remove & Discard patch within 12 hours or as directed by MD   polyethylene glycol packet Commonly known as:  MIRALAX / GLYCOLAX Take 17 g by mouth daily as needed for mild constipation.   potassium chloride 10 MEQ tablet Commonly known as:  K-DUR,KLOR-CON Take 1 tablet (10 mEq total) by mouth daily.   pramipexole 1 MG  tablet Commonly known as:  MIRAPEX Take 1 mg by mouth at bedtime.   QUEtiapine 25 MG tablet Commonly known as:  SEROQUEL Take 1 tablet (25 mg total) by mouth at bedtime.   tamsulosin 0.4 MG Caps capsule Commonly known as:  FLOMAX Take 0.4 mg by mouth daily.   tiotropium 18 MCG inhalation capsule Commonly known as:  SPIRIVA Place 18 mcg into inhaler and inhale daily.   traMADol 50 MG tablet Commonly known as:  ULTRAM Take 1 tablet (50 mg total) by mouth every 12 (twelve) hours as needed.       Allergies: No Known Allergies  Family History: Family History  Problem Relation Age of Onset  . Alcohol abuse Father   . Arthritis Mother   . Heart disease Maternal Grandmother   . Prostate cancer Brother     Social History:  reports that he has been smoking.  He has a 60.00 pack-year smoking history. He has never used smokeless tobacco. He reports that he does not drink alcohol or use drugs.  ROS: UROLOGY Frequent Urination?: Yes Hard to postpone urination?: Yes Burning/pain with urination?: No Get up at night to urinate?: Yes Leakage of urine?: Yes Urine stream starts and stops?: Yes Trouble starting stream?: Yes Do you have to strain to urinate?: No Blood in urine?: Yes Urinary tract infection?: Yes Sexually transmitted disease?: No Injury to kidneys or bladder?: No Painful intercourse?: No Weak stream?: Yes Erection problems?: Yes Penile pain?: No  Gastrointestinal Nausea?: No Vomiting?: No Indigestion/heartburn?: No Diarrhea?: No Constipation?: No  Constitutional Fever: No Night sweats?: No Weight loss?: No Fatigue?: No  Skin Skin rash/lesions?: No Itching?: No  Eyes Blurred vision?: No Double vision?: No  Ears/Nose/Throat Sore throat?: No Sinus problems?: No  Hematologic/Lymphatic Swollen glands?: No Easy bruising?: No  Cardiovascular Leg swelling?: Yes Chest pain?: No  Respiratory Cough?: No Shortness of breath?:  No  Endocrine Excessive thirst?: Yes  Musculoskeletal Back pain?: Yes Joint pain?: No  Neurological Headaches?: No Dizziness?: No  Psychologic Depression?: No Anxiety?: Yes  Physical Exam: BP (!) 166/94 (BP Location: Left Arm, Patient Position: Sitting, Cuff Size: Normal)   Pulse 60   Ht 5\' 9"  (1.753 m)   BMI 26.58 kg/m   Constitutional:  Alert and oriented, No acute distress. HEENT: [place] AT, moist mucus membranes.  Trachea midline, no masses. Cardiovascular: No clubbing, cyanosis, or edema. Respiratory: Normal respiratory effort, no increased work of breathing. GI: Abdomen is soft, nontender, nondistended, no abdominal masses GU: No CVA tenderness.  DRE: 80 g prostate, smooth, no hard area or nodule  Skin: No rashes, bruises or suspicious lesions. Lymph: No  cervical or inguinal adenopathy. Neurologic: Grossly intact, no focal deficits, moving all 4 extremities. Psychiatric: Normal mood and affect.  Laboratory Data: Lab Results  Component Value Date   WBC 14.2 (H) 10/01/2017   HGB 13.2 10/01/2017   HCT 39.9 (L) 10/01/2017   MCV 90.7 10/01/2017   PLT 285 10/01/2017    Lab Results  Component Value Date   CREATININE 1.43 (H) 10/01/2017    No results found for: PSA1  No results found for: TESTOSTERONE  Lab Results  Component Value Date   HGBA1C 6.0 01/18/2017    Urinalysis Lab Results  Component Value Date   SPECGRAV >=1.030 (A) 06/19/2017   PHUR 6.0 06/19/2017   COLORU yellow 06/19/2017   APPEARANCEUR CLOUDY (A) 10/01/2017   LEUKOCYTESUR (A) 10/01/2017    TEST NOT REPORTED DUE TO COLOR INTERFERENCE OF URINE PIGMENT   PROTEINUR (A) 10/01/2017    TEST NOT REPORTED DUE TO COLOR INTERFERENCE OF URINE PIGMENT   GLUCOSEU (A) 10/01/2017    TEST NOT REPORTED DUE TO COLOR INTERFERENCE OF URINE PIGMENT   KETONESU negative 06/19/2017   RBCU TOO NUMEROUS TO COUNT 10/01/2017   BILIRUBINUR (A) 10/01/2017    TEST NOT REPORTED DUE TO COLOR INTERFERENCE OF URINE  PIGMENT   NITRITE (A) 10/01/2017    TEST NOT REPORTED DUE TO COLOR INTERFERENCE OF URINE PIGMENT    Lab Results  Component Value Date   BACTERIA NONE SEEN 10/01/2017   Pertinent imaging: CT  No results found for this or any previous visit. No results found for this or any previous visit. No results found for this or any previous visit. No results found for this or any previous visit. No results found for this or any previous visit. No results found for this or any previous visit. No results found for this or any previous visit. No results found for this or any previous visit.  Assessment & Plan:    1. Benign prostatic hyperplasia with lower urinary tract symptoms, symptom details unspecified PVR better today (300's). He's back on tamsulosin. Discussed with patient and son nature r/b of 5ari and he'll start finasteride (discussed fda warnings).   2. Gross hematuria - benign CT Jul 2018. He'll return for cystoscopy after renal US.   3. PSA - discussed with pt and son nature of elevated PSA - benign vs malignant causes.  We discussed the nature risks and benefits of continued surveillance, other lab tests, imaging as well as prostate biopsy. We discussed the management of prostate cancer might include active surveillance or treatment depending on biopsy findings. All questions answered. Given his age, PE/Eliquis and normal PSAD/stable PSA will monitor.  - Bladder Scan (Post Void Residual) in office   No Follow-up on file.  Festus Aloe, [facility] [address] [place], [place] [address] [phone]

## 2017-10-04 LAB — URINE CULTURE

## 2017-10-20 DIAGNOSIS — I5023 Acute on chronic systolic (congestive) heart failure: Secondary | ICD-10-CM | POA: Diagnosis not present

## 2017-10-20 DIAGNOSIS — I480 Paroxysmal atrial fibrillation: Secondary | ICD-10-CM | POA: Diagnosis not present

## 2017-10-20 DIAGNOSIS — Z23 Encounter for immunization: Secondary | ICD-10-CM | POA: Diagnosis not present

## 2017-10-24 ENCOUNTER — Telehealth: Payer: Self-pay | Admitting: Urology

## 2017-10-24 ENCOUNTER — Ambulatory Visit
Admission: RE | Admit: 2017-10-24 | Discharge: 2017-10-24 | Disposition: A | Discharge status: Home / Self Care | Payer: Medicare Other | Source: Ambulatory Visit | Attending: Urology | Admitting: Urology

## 2017-10-24 DIAGNOSIS — N400 Enlarged prostate without lower urinary tract symptoms: Secondary | ICD-10-CM | POA: Diagnosis not present

## 2017-10-24 DIAGNOSIS — R310 Gross hematuria: Secondary | ICD-10-CM | POA: Diagnosis present

## 2017-10-24 DIAGNOSIS — N1330 Unspecified hydronephrosis: Secondary | ICD-10-CM | POA: Diagnosis not present

## 2017-10-24 NOTE — Telephone Encounter (Signed)
App made  Patient is not bale to come until Thursday   Michelle

## 2017-10-26 ENCOUNTER — Ambulatory Visit (INDEPENDENT_AMBULATORY_CARE_PROVIDER_SITE_OTHER): Payer: Medicare Other | Admitting: Urology

## 2017-10-26 ENCOUNTER — Encounter: Payer: Self-pay | Admitting: Urology

## 2017-10-26 VITALS — BP 144/81 | HR 68 | Ht 69.0 in

## 2017-10-26 DIAGNOSIS — N1339 Other hydronephrosis: Secondary | ICD-10-CM

## 2017-10-26 DIAGNOSIS — N401 Enlarged prostate with lower urinary tract symptoms: Secondary | ICD-10-CM

## 2017-10-26 LAB — URINALYSIS, COMPLETE
Bilirubin, UA: NEGATIVE
Glucose, UA: NEGATIVE
KETONES UA: NEGATIVE
Leukocytes, UA: NEGATIVE
NITRITE UA: NEGATIVE
PH UA: 6 (ref 5.0–7.5)
Protein, UA: NEGATIVE
RBC UA: NEGATIVE
SPEC GRAV UA: 1.015 (ref 1.005–1.030)
UUROB: 0.2 mg/dL (ref 0.2–1.0)

## 2017-10-26 LAB — BLADDER SCAN AMB NON-IMAGING: Scan Result: 591

## 2017-10-26 MED ORDER — LIDOCAINE HCL 2 % EX GEL: 1.0000 "application " | Freq: Once | CUTANEOUS | Status: DC

## 2017-10-26 MED ORDER — CIPROFLOXACIN HCL 500 MG PO TABS: 500.0000 mg | ORAL_TABLET | Freq: Once | ORAL | Status: DC

## 2017-10-26 NOTE — Progress Notes (Signed)
   10/26/17  CC:  Chief Complaint  Patient presents with  . Benign Prostatic Hypertrophy    HPI:  The patient is a 76 year old gentleman presents today for follow-up as well as cystoscopy  1) BPH and LUTS. He c/o urgency and UUI. He tried tamsulosin.  PVR was 597 ml. Prostate was 115 g on CT Jul 2018. He has a weak stream. This has been going on for a few months. He restarted tamsulosin. NG risk includes back surgery Jul 2018 with a slow recovery requiring PT. He walks slowly.  Finasteride was started in October 2018.   2) gross hematuria - pt went to ED 10/01/2017 c/o red urine. No clots. No bac seen but tntc wbc and tntc rbc. Bun 14, cr 1.43. (up from 1.06). He underwent CT 06/24/2017 which showed a normal GU tract and a 115 g prostate with a moderately distended bladder. He is a smoker.  Repeat renal ultrasound shows no renal masses however he had bilateral hydronephrosis which is new from prior CT.  His circumferential bladder wall thickening and enlarged prostate.  Bladder also appears massively distended  3) PSA elevation. Associated with 115 g prostate on imaging.  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  4) 10 mm indeterminate right adrenal nodule - on CT Jul 2018. Rad rec CT or MR Jul 2019.    Blood pressure (!) 144/81, pulse 68, height 5\' 9"  (1.753 m). NED. A&Ox3.   No respiratory distress   Abd soft, NT, ND Normal phallus with bilateral descended testicles  Cystoscopy Procedure Note  Patient identification was confirmed, informed consent was obtained, and patient was prepped using Betadine solution.  Lidocaine jelly was administered per urethral meatus.    Preoperative abx where received prior to procedure.     Pre-Procedure: - Inspection reveals a normal caliber ureteral meatus.  Procedure: The flexible cystoscope was introduced without difficulty - No urethral strictures/lesions are present. - Enlarged  prostate significant lateral lobe hypertrophy.  At least 5 cm in length.  Significant visual obstruction with a high bladder neck due to significant hypertrophy. - Normal bladder neck - Bilateral ureteral orifices identified - Bladder mucosa  reveals no ulcers, tumors, or lesions - No bladder stones - No trabeculation  Retroflexion shows small intravesical lobe   Post-Procedure: - Patient tolerated the procedure well  Assessment/ Plan:  1. Benign prostatic hyperplasia  Continue finasteride. Will increase Flomax to 0.8 mg.  PVR 591 cc today.  Since his finasteride was started only 6 weeks ago, he does understand that it will take 6 months to have its full effect and may decrease his prostate size by up to 30%.  He currently is not the best surgical candidate due to his PE (06/2017) on Eliquis.  Would recommend urodynamic studies before any surgical intervention if he is ever a candidate.  For now, will temporize him with a Foley catheter.  He will follow-up early in the morning 1 month for trial of void.  I do not think he has capacity to perform CIC.  We will perform a renal ultrasound prior to this follow-up as well to ensure resolution of hydronephrosis.  2. Gross hematuria - benign CT Jul 2018. Benign renal u/s except for hydronephrosis 2/2 retention. Benign cystoscopy though significant visual obstruction of prostate.  3. PSA  Given his age, PE/Eliquis and normal PSAD/stable PSA will monitor as discussed at prior appointment

## 2017-10-27 ENCOUNTER — Telehealth: Payer: Self-pay

## 2017-10-27 NOTE — Telephone Encounter (Signed)
Pt son called stating pt has been bleeding since foley was placed. Reinforced with son to have pt increase fluid in take and bleeding is normal. Son also stated that pt is having bladder spasms and requested a medication to help. Per Dr. Jones Skene 25mg  was given and instructed to stop medication 3 days prior to V&T. Son voiced understanding of whole conversation.

## 2017-10-30 ENCOUNTER — Other Ambulatory Visit: Payer: Self-pay

## 2017-11-08 ENCOUNTER — Telehealth: Payer: Self-pay

## 2017-11-08 NOTE — Telephone Encounter (Signed)
Pt son, [name], called stating pt is very anxious and starting to have physchosis with foley in place. [name] stated that pt will sit in the chair for hours holding the foley as tight as he can and just moan about the pain. [name] also stated that he checks on pt daily, but as soon as he leaves the pt will call him talking about the catheter is falling apart and falling out of his bladder. [name] states that he will go back to pt house and see what is falling apart and actually itll be the leg strap has moved down. [name] requested to move pt OV up atleast 1 week. Appt was moved. [name] inquired about a TOV earlier than the OV. Please advise.

## 2017-11-09 NOTE — Telephone Encounter (Signed)
Spoke with pt in reference to [place]. Pt voiced understanding.

## 2017-11-16 ENCOUNTER — Encounter: Payer: Self-pay | Admitting: Urology

## 2017-11-16 ENCOUNTER — Ambulatory Visit (INDEPENDENT_AMBULATORY_CARE_PROVIDER_SITE_OTHER): Payer: Medicare Other | Admitting: Urology

## 2017-11-16 VITALS — BP 122/74 | HR 91 | Ht 69.0 in

## 2017-11-16 DIAGNOSIS — R339 Retention of urine, unspecified: Secondary | ICD-10-CM

## 2017-11-16 LAB — BLADDER SCAN AMB NON-IMAGING: SCAN RESULT: 170

## 2017-11-16 NOTE — Progress Notes (Signed)
Fill and Pull Catheter Removal  Patient is present today for a catheter removal.  Patient was cleaned and prepped in a sterile fashion 146ml of sterile water/ saline was instilled into the bladder when the patient felt the urge to urinate. 4ml of water was then drained from the balloon.  A 16FR foley cath was removed from the bladder no complications were noted .  Patient as then given some time to void on their own.  Patient cannot void on their own after some time.  Patient tolerated well.  Preformed by: Elberta Leatherwood, CMA  Follow up/ Additional notes: Return at 2:30 for PVR

## 2017-11-16 NOTE — Progress Notes (Signed)
Bladder Scan: 170 Patient can void:  Performed By: Toniann Fail, LPN

## 2017-11-16 NOTE — Progress Notes (Signed)
11/16/2017 9:22 AM   Ricardo Erickson Oct 05, 1941 [id number]  Referring provider: Rusty Aus, MD [place] [facility] [place], [place] [address]  Chief Complaint  Patient presents with  . Urinary Retention    HPI: The patient is a 76 year old gentleman presents today for follow-up as well as cystoscopy  1) BPH and LUTS. He c/o urgency and UUI. He is on flomax 0.8 mg and finasteride.  PVR was 597 ml. Prostate was 115 g on CT Jul 2018. He has a weak stream. This has been going on for a few months. He restarted tamsulosin. NG risk includes back surgery in Jul 2018 with a slow recovery requiring PT. He walks slowly.  Finasteride was started in October 2018.   Cystoscopy in November 2018 showed significant lateral lobe hypertrophy with at least 5 cm in length.  He had significant visual obstruction of the high bladder neck due to the significant hypertrophy.  He also has small intravesical lobe.  Currently is not a surgical candidate due to PE in July 2018 requiring Eliquis.  Eliquis has been stopped.  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  He is incredibly anxious about his catheter.  2) gross hematuria - pt went to ED 10/01/2017 c/o red urine. No clots. No bac seen but tntc wbc and tntc rbc. Bun 14, cr 1.43. (up from 1.06). He underwent CT 06/24/2017 which showed a normal GU tract and a 115 g prostate with a moderately distended bladder. He is a smoker.  Repeat renal ultrasound shows no renal masses however he had bilateral hydronephrosis which is new from prior CT.  His circumferential bladder wall thickening and enlarged prostate.  Bladder also appears massively distended  3) PSA elevation. Associated with 115 g prostate on imaging.  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  4) 10 mm indeterminate right adrenal nodule - on CT Jul 2018. Rad rec CT or MR Jul 2019.        PMH: Past Medical  History:  Diagnosis Date  . Chronic back pain   . Depression   . Emphysema lung ([place])   . Hypertension   . Personal history of tobacco use, presenting hazards to health 08/06/2015  . Pollen allergies   . PTSD (post-traumatic stress disorder)     Surgical History: Past Surgical History:  Procedure Laterality Date  . BACK SURGERY    . KYPHOPLASTY N/A 06/27/2017   Procedure: KYPHOPLASTY -L5;  Surgeon: Hessie Knows, MD;  Location: [facility] ORS;  Service: Orthopedics;  Laterality: N/A;  . TONSILLECTOMY AND ADENOIDECTOMY  1980    Home Medications:  Allergies as of 11/16/2017   No Known Allergies     Medication List        Accurate as of 11/16/17  9:22 AM. Always use your most recent med list.          acetaminophen 650 MG CR tablet Commonly known as:  TYLENOL Take 650 mg by mouth every 8 (eight) hours as needed for pain.   ADVAIR DISKUS 250-50 MCG/DOSE Aepb Generic drug:  Fluticasone-Salmeterol Inhale 1 puff into the lungs every 12 (twelve) hours. Reported on 04/01/2016   albuterol 108 (90 Base) MCG/ACT inhaler Commonly known as:  PROVENTIL HFA;VENTOLIN HFA Inhale 2 puffs into the lungs as needed for wheezing or shortness of breath.   albuterol (2.5 MG/3ML) 0.083% nebulizer solution Commonly known as:  PROVENTIL Inhale 2.5 mg into  the lungs every 6 (six) hours as needed.   amiodarone 200 MG tablet Commonly known as:  PACERONE Take 1 tablet (200 mg total) by mouth daily.   apixaban 5 MG Tabs tablet Commonly known as:  ELIQUIS Take 10 mg PO BID X 4 days then take 5 mg PO BID   busPIRone 7.5 MG tablet Commonly known as:  BUSPAR Take 1 tablet twice daily for anxiety.   cyclobenzaprine 5 MG tablet Commonly known as:  FLEXERIL Take 5 mg by mouth 2 (two) times daily as needed for muscle spasms.   digoxin 0.125 MG tablet Commonly known as:  LANOXIN Take 1 tablet (0.125 mg total) by mouth daily.   diltiazem 240 MG 24 hr capsule Commonly known as:  CARDIZEM CD Take 1  capsule (240 mg total) by mouth daily.   finasteride 5 MG tablet Commonly known as:  PROSCAR Take 1 tablet (5 mg total) by mouth daily.   furosemide 20 MG tablet Commonly known as:  LASIX Take 20 mg by mouth daily.   lidocaine 5 % Commonly known as:  LIDODERM Place 1 patch onto the skin daily. Remove & Discard patch within 12 hours or as directed by MD   polyethylene glycol packet Commonly known as:  MIRALAX / GLYCOLAX Take 17 g by mouth daily as needed for mild constipation.   potassium chloride 10 MEQ tablet Commonly known as:  K-DUR,KLOR-CON Take 1 tablet (10 mEq total) by mouth daily.   pramipexole 1 MG tablet Commonly known as:  MIRAPEX Take 1 mg by mouth at bedtime.   QUEtiapine 25 MG tablet Commonly known as:  SEROQUEL Take 1 tablet (25 mg total) by mouth at bedtime.   tamsulosin 0.4 MG Caps capsule Commonly known as:  FLOMAX Take 0.4 mg by mouth daily.   tiotropium 18 MCG inhalation capsule Commonly known as:  SPIRIVA Place 18 mcg into inhaler and inhale daily.   traMADol 50 MG tablet Commonly known as:  ULTRAM Take 1 tablet (50 mg total) by mouth every 12 (twelve) hours as needed.       Allergies: No Known Allergies  Family History: Family History  Problem Relation Age of Onset  . Alcohol abuse Father   . Arthritis Mother   . Heart disease Maternal Grandmother   . Prostate cancer Brother     Social History:  reports that he has been smoking.  He has a 60.00 pack-year smoking history. he has never used smokeless tobacco. He reports that he does not drink alcohol or use drugs.  ROS: UROLOGY Frequent Urination?: No Hard to postpone urination?: No Burning/pain with urination?: No Get up at night to urinate?: No Leakage of urine?: No Urine stream starts and stops?: No Trouble starting stream?: No Do you have to strain to urinate?: No Blood in urine?: No Urinary tract infection?: No Sexually transmitted disease?: No Injury to kidneys or  bladder?: No Painful intercourse?: No Weak stream?: No Erection problems?: No Penile pain?: No  Gastrointestinal Nausea?: No Vomiting?: No Indigestion/heartburn?: No Diarrhea?: No Constipation?: No  Constitutional Fever: No Night sweats?: No Weight loss?: No Fatigue?: No  Skin Skin rash/lesions?: No Itching?: No  Eyes Blurred vision?: No Double vision?: No  Ears/Nose/Throat Sore throat?: No Sinus problems?: No  Hematologic/Lymphatic Swollen glands?: No Easy bruising?: No  Cardiovascular Leg swelling?: Yes Chest pain?: No  Respiratory Cough?: Yes Shortness of breath?: No  Endocrine Excessive thirst?: No  Musculoskeletal Back pain?: No Joint pain?: No  Neurological Headaches?: No Dizziness?: No  Psychologic Depression?: No Anxiety?: No  Physical Exam: BP 122/74 (BP Location: Left Arm, Patient Position: Sitting, Cuff Size: Normal)   Pulse 91   Ht 5\' 9"  (1.753 m)   BMI 26.58 kg/m   Constitutional:  Alert and oriented, No acute distress. HEENT: [place] AT, moist mucus membranes.  Trachea midline, no masses. Cardiovascular: No clubbing, cyanosis, or edema. Respiratory: Normal respiratory effort, no increased work of breathing. GI: Abdomen is soft, nontender, nondistended, no abdominal masses GU: No CVA tenderness.  Skin: No rashes, bruises or suspicious lesions. Lymph: No cervical or inguinal adenopathy. Neurologic: Grossly intact, no focal deficits, moving all 4 extremities. Psychiatric: Normal mood and affect.  Laboratory Data: Lab Results  Component Value Date   WBC 14.2 (H) 10/01/2017   HGB 13.2 10/01/2017   HCT 39.9 (L) 10/01/2017   MCV 90.7 10/01/2017   PLT 285 10/01/2017    Lab Results  Component Value Date   CREATININE 1.43 (H) 10/01/2017    Lab Results  Component Value Date   PSA 10.33 (H) 06/08/2017   PSA 8.42 (H) 01/18/2017   PSA 10.69 (H) 07/25/2016    No results found for: TESTOSTERONE  Lab Results  Component Value  Date   HGBA1C 6.0 01/18/2017    Urinalysis    Component Value Date/Time   COLORURINE RED (A) 10/01/2017 1146   APPEARANCEUR Clear 10/26/2017 1013   LABSPEC 1.014 10/01/2017 1146   PHURINE  10/01/2017 1146    TEST NOT REPORTED DUE TO COLOR INTERFERENCE OF URINE PIGMENT   GLUCOSEU Negative 10/26/2017 1013   HGBUR (A) 10/01/2017 1146    TEST NOT REPORTED DUE TO COLOR INTERFERENCE OF URINE PIGMENT   BILIRUBINUR Negative 10/26/2017 1013   KETONESUR (A) 10/01/2017 1146    TEST NOT REPORTED DUE TO COLOR INTERFERENCE OF URINE PIGMENT   PROTEINUR Negative 10/26/2017 1013   PROTEINUR (A) 10/01/2017 1146    TEST NOT REPORTED DUE TO COLOR INTERFERENCE OF URINE PIGMENT   UROBILINOGEN 0.2 06/19/2017 1216   NITRITE Negative 10/26/2017 1013   NITRITE (A) 10/01/2017 1146    TEST NOT REPORTED DUE TO COLOR INTERFERENCE OF URINE PIGMENT   LEUKOCYTESUR Negative 10/26/2017 1013     Assessment & Plan:    1. Benign prostatic hyperplasia  Continue finasteride a Flomax to 0.8 mg.  Would recommend urodynamic studies before any surgical intervention if he is ever a candidate for a TURP/HoLEP.   I did discuss with the son that any procedure even an SP tube would require anesthesia.  He would need surgical clearance for either of these procedures.  Patient had bladder spasm during fill and pull.  Will return this afternoon for PVR.  2. Gross hematuria- benign CT Jul 2018. Benign renal u/s except for hydronephrosis 2/2 retention. Benign cystoscopy though significant visual obstruction of prostate.  3. PSA  Given his age, PE/Eliquis and normal PSAD/stable PSA will monitor as discussed at prior appointment     No Follow-up on file.  Nickie Retort, MD  [facility] [address] [place], [place] [address] [phone]

## 2017-11-18 ENCOUNTER — Other Ambulatory Visit: Payer: Self-pay

## 2017-11-18 ENCOUNTER — Inpatient Hospital Stay
Admission: EM | Admit: 2017-11-18 | Discharge: 2017-11-22 | DRG: 175 | Disposition: A | Discharge status: Skilled Nursing Facility | Payer: Medicare Other | Attending: Internal Medicine | Admitting: Internal Medicine

## 2017-11-18 ENCOUNTER — Inpatient Hospital Stay: Payer: Medicare Other

## 2017-11-18 ENCOUNTER — Emergency Department: Payer: Medicare Other

## 2017-11-18 DIAGNOSIS — M549 Dorsalgia, unspecified: Secondary | ICD-10-CM | POA: Diagnosis present

## 2017-11-18 DIAGNOSIS — Z86711 Personal history of pulmonary embolism: Secondary | ICD-10-CM | POA: Diagnosis not present

## 2017-11-18 DIAGNOSIS — I4891 Unspecified atrial fibrillation: Secondary | ICD-10-CM | POA: Diagnosis not present

## 2017-11-18 DIAGNOSIS — J9601 Acute respiratory failure with hypoxia: Secondary | ICD-10-CM

## 2017-11-18 DIAGNOSIS — Z7951 Long term (current) use of inhaled steroids: Secondary | ICD-10-CM

## 2017-11-18 DIAGNOSIS — G8929 Other chronic pain: Secondary | ICD-10-CM | POA: Diagnosis present

## 2017-11-18 DIAGNOSIS — I824Z1 Acute embolism and thrombosis of unspecified deep veins of right distal lower extremity: Secondary | ICD-10-CM | POA: Diagnosis not present

## 2017-11-18 DIAGNOSIS — Z79899 Other long term (current) drug therapy: Secondary | ICD-10-CM | POA: Diagnosis not present

## 2017-11-18 DIAGNOSIS — R0789 Other chest pain: Secondary | ICD-10-CM

## 2017-11-18 DIAGNOSIS — R296 Repeated falls: Secondary | ICD-10-CM | POA: Diagnosis present

## 2017-11-18 DIAGNOSIS — Z8042 Family history of malignant neoplasm of prostate: Secondary | ICD-10-CM

## 2017-11-18 DIAGNOSIS — Z7901 Long term (current) use of anticoagulants: Secondary | ICD-10-CM | POA: Diagnosis not present

## 2017-11-18 DIAGNOSIS — F17210 Nicotine dependence, cigarettes, uncomplicated: Secondary | ICD-10-CM | POA: Diagnosis present

## 2017-11-18 DIAGNOSIS — F4310 Post-traumatic stress disorder, unspecified: Secondary | ICD-10-CM | POA: Diagnosis present

## 2017-11-18 DIAGNOSIS — N182 Chronic kidney disease, stage 2 (mild): Secondary | ICD-10-CM | POA: Diagnosis present

## 2017-11-18 DIAGNOSIS — Z66 Do not resuscitate: Secondary | ICD-10-CM | POA: Diagnosis not present

## 2017-11-18 DIAGNOSIS — F329 Major depressive disorder, single episode, unspecified: Secondary | ICD-10-CM | POA: Diagnosis present

## 2017-11-18 DIAGNOSIS — M6281 Muscle weakness (generalized): Secondary | ICD-10-CM | POA: Diagnosis not present

## 2017-11-18 DIAGNOSIS — S2242XD Multiple fractures of ribs, left side, subsequent encounter for fracture with routine healing: Secondary | ICD-10-CM | POA: Diagnosis not present

## 2017-11-18 DIAGNOSIS — J449 Chronic obstructive pulmonary disease, unspecified: Secondary | ICD-10-CM

## 2017-11-18 DIAGNOSIS — J439 Emphysema, unspecified: Secondary | ICD-10-CM | POA: Diagnosis present

## 2017-11-18 DIAGNOSIS — Z91128 Patient's intentional underdosing of medication regimen for other reason: Secondary | ICD-10-CM

## 2017-11-18 DIAGNOSIS — R410 Disorientation, unspecified: Secondary | ICD-10-CM

## 2017-11-18 DIAGNOSIS — T45516A Underdosing of anticoagulants, initial encounter: Secondary | ICD-10-CM | POA: Diagnosis present

## 2017-11-18 DIAGNOSIS — I5032 Chronic diastolic (congestive) heart failure: Secondary | ICD-10-CM | POA: Diagnosis not present

## 2017-11-18 DIAGNOSIS — I700 Atherosclerosis of aorta: Secondary | ICD-10-CM | POA: Diagnosis not present

## 2017-11-18 DIAGNOSIS — Z720 Tobacco use: Secondary | ICD-10-CM | POA: Diagnosis not present

## 2017-11-18 DIAGNOSIS — W19XXXD Unspecified fall, subsequent encounter: Secondary | ICD-10-CM | POA: Diagnosis not present

## 2017-11-18 DIAGNOSIS — I82491 Acute embolism and thrombosis of other specified deep vein of right lower extremity: Secondary | ICD-10-CM | POA: Diagnosis present

## 2017-11-18 DIAGNOSIS — I2609 Other pulmonary embolism with acute cor pulmonale: Secondary | ICD-10-CM

## 2017-11-18 DIAGNOSIS — I5030 Unspecified diastolic (congestive) heart failure: Secondary | ICD-10-CM | POA: Diagnosis not present

## 2017-11-18 DIAGNOSIS — Z743 Need for continuous supervision: Secondary | ICD-10-CM | POA: Diagnosis not present

## 2017-11-18 DIAGNOSIS — R079 Chest pain, unspecified: Secondary | ICD-10-CM | POA: Diagnosis not present

## 2017-11-18 DIAGNOSIS — S2249XA Multiple fractures of ribs, unspecified side, initial encounter for closed fracture: Secondary | ICD-10-CM

## 2017-11-18 DIAGNOSIS — I2699 Other pulmonary embolism without acute cor pulmonale: Secondary | ICD-10-CM | POA: Diagnosis not present

## 2017-11-18 DIAGNOSIS — N400 Enlarged prostate without lower urinary tract symptoms: Secondary | ICD-10-CM | POA: Diagnosis present

## 2017-11-18 DIAGNOSIS — S2242XA Multiple fractures of ribs, left side, initial encounter for closed fracture: Secondary | ICD-10-CM | POA: Diagnosis not present

## 2017-11-18 DIAGNOSIS — N390 Urinary tract infection, site not specified: Secondary | ICD-10-CM | POA: Diagnosis not present

## 2017-11-18 DIAGNOSIS — R0602 Shortness of breath: Secondary | ICD-10-CM | POA: Diagnosis not present

## 2017-11-18 DIAGNOSIS — B9689 Other specified bacterial agents as the cause of diseases classified elsewhere: Secondary | ICD-10-CM | POA: Diagnosis present

## 2017-11-18 DIAGNOSIS — I130 Hypertensive heart and chronic kidney disease with heart failure and stage 1 through stage 4 chronic kidney disease, or unspecified chronic kidney disease: Secondary | ICD-10-CM | POA: Diagnosis present

## 2017-11-18 DIAGNOSIS — W19XXXA Unspecified fall, initial encounter: Secondary | ICD-10-CM | POA: Diagnosis present

## 2017-11-18 DIAGNOSIS — I2602 Saddle embolus of pulmonary artery with acute cor pulmonale: Secondary | ICD-10-CM | POA: Diagnosis not present

## 2017-11-18 DIAGNOSIS — I509 Heart failure, unspecified: Secondary | ICD-10-CM | POA: Diagnosis not present

## 2017-11-18 DIAGNOSIS — F05 Delirium due to known physiological condition: Secondary | ICD-10-CM | POA: Diagnosis present

## 2017-11-18 DIAGNOSIS — F0390 Unspecified dementia without behavioral disturbance: Secondary | ICD-10-CM | POA: Diagnosis present

## 2017-11-18 DIAGNOSIS — I110 Hypertensive heart disease with heart failure: Secondary | ICD-10-CM | POA: Diagnosis not present

## 2017-11-18 DIAGNOSIS — T83511A Infection and inflammatory reaction due to indwelling urethral catheter, initial encounter: Secondary | ICD-10-CM | POA: Diagnosis present

## 2017-11-18 HISTORY — DX: Heart failure, unspecified: I50.9

## 2017-11-18 HISTORY — DX: Other pulmonary embolism without acute cor pulmonale: I26.99

## 2017-11-18 HISTORY — DX: Unspecified dementia, unspecified severity, without behavioral disturbance, psychotic disturbance, mood disturbance, and anxiety: F03.90

## 2017-11-18 LAB — BASIC METABOLIC PANEL
Anion gap: 11 (ref 5–15)
BUN: 21 mg/dL — ABNORMAL HIGH (ref 6–20)
CALCIUM: 9.3 mg/dL (ref 8.9–10.3)
CO2: 28 mmol/L (ref 22–32)
CREATININE: 1.32 mg/dL — AB (ref 0.61–1.24)
Chloride: 96 mmol/L — ABNORMAL LOW (ref 101–111)
GFR, EST AFRICAN AMERICAN: 60 mL/min — AB (ref >60)
GFR, EST NON AFRICAN AMERICAN: 51 mL/min — AB (ref >60)
Glucose, Bld: 118 mg/dL — ABNORMAL HIGH (ref 65–99)
Potassium: 3.7 mmol/L (ref 3.5–5.1)
SODIUM: 135 mmol/L (ref 135–145)

## 2017-11-18 LAB — HEPATIC FUNCTION PANEL
ALT: 7 U/L — AB (ref 17–63)
AST: 22 U/L (ref 15–41)
Albumin: 3.2 g/dL — ABNORMAL LOW (ref 3.5–5.0)
Alkaline Phosphatase: 80 U/L (ref 38–126)
BILIRUBIN DIRECT: 0.2 mg/dL (ref 0.1–0.5)
BILIRUBIN TOTAL: 0.3 mg/dL (ref 0.3–1.2)
Indirect Bilirubin: 0.1 mg/dL — ABNORMAL LOW (ref 0.3–0.9)
Total Protein: 7 g/dL (ref 6.5–8.1)

## 2017-11-18 LAB — CBC
HCT: 41.3 % (ref 40.0–52.0)
Hemoglobin: 13.6 g/dL (ref 13.0–18.0)
MCH: 30.2 pg (ref 26.0–34.0)
MCHC: 33 g/dL (ref 32.0–36.0)
MCV: 91.3 fL (ref 80.0–100.0)
PLATELETS: 299 10*3/uL (ref 150–440)
RBC: 4.53 MIL/uL (ref 4.40–5.90)
RDW: 14.7 % — ABNORMAL HIGH (ref 11.5–14.5)
WBC: 15.2 10*3/uL — AB (ref 3.8–10.6)

## 2017-11-18 LAB — URINALYSIS, COMPLETE (UACMP) WITH MICROSCOPIC
Bilirubin Urine: NEGATIVE
GLUCOSE, UA: NEGATIVE mg/dL
Ketones, ur: NEGATIVE mg/dL
NITRITE: POSITIVE — AB
PH: 6 (ref 5.0–8.0)
Protein, ur: 100 mg/dL — AB
Specific Gravity, Urine: 1.027 (ref 1.005–1.030)
Squamous Epithelial / LPF: NONE SEEN

## 2017-11-18 LAB — PROTIME-INR
INR: 1.16
PROTHROMBIN TIME: 14.7 s (ref 11.4–15.2)

## 2017-11-18 LAB — BRAIN NATRIURETIC PEPTIDE: B NATRIURETIC PEPTIDE 5: 26 pg/mL (ref 0.0–100.0)

## 2017-11-18 LAB — TROPONIN I: Troponin I: <0.03 ng/mL (ref <0.03)

## 2017-11-18 LAB — HEPARIN LEVEL (UNFRACTIONATED): Heparin Unfractionated: 0.13 IU/mL — ABNORMAL LOW (ref 0.30–0.70)

## 2017-11-18 LAB — APTT: APTT: 47 s — AB (ref 24–36)

## 2017-11-18 MED ORDER — ONDANSETRON HCL 4 MG PO TABS: 4.0000 mg | ORAL_TABLET | Freq: Four times a day (QID) | ORAL | Status: DC | PRN

## 2017-11-18 MED ORDER — ACETAMINOPHEN 325 MG PO TABS: 650.0000 mg | ORAL_TABLET | Freq: Four times a day (QID) | ORAL | Status: DC | PRN

## 2017-11-18 MED ORDER — MORPHINE SULFATE (PF) 2 MG/ML IV SOLN
2.0000 mg | INTRAVENOUS | Status: DC | PRN
  Administered 2017-11-18 – 2017-11-19 (×3): 2 mg via INTRAVENOUS
  Filled 2017-11-18 (×3): qty 1

## 2017-11-18 MED ORDER — MORPHINE SULFATE (PF) 4 MG/ML IV SOLN
4.0000 mg | Freq: Once | INTRAVENOUS | Status: AC
  Administered 2017-11-18: 4 mg via INTRAVENOUS

## 2017-11-18 MED ORDER — QUETIAPINE FUMARATE 25 MG PO TABS
25.0000 mg | ORAL_TABLET | Freq: Every day | ORAL | Status: DC
  Administered 2017-11-18 – 2017-11-21 (×4): 25 mg via ORAL
  Filled 2017-11-18 (×4): qty 1

## 2017-11-18 MED ORDER — HEPARIN (PORCINE) IN NACL 100-0.45 UNIT/ML-% IJ SOLN
1650.0000 [IU]/h | INTRAMUSCULAR | Status: DC
  Administered 2017-11-18: 11:00:00 1300 [IU]/h via INTRAVENOUS
  Administered 2017-11-19: 03:00:00 1450 [IU]/h via INTRAVENOUS
  Administered 2017-11-19: 1650 [IU]/h via INTRAVENOUS
  Filled 2017-11-18 (×3): qty 250

## 2017-11-18 MED ORDER — ACETAMINOPHEN 650 MG RE SUPP: 650.0000 mg | Freq: Four times a day (QID) | RECTAL | Status: DC | PRN

## 2017-11-18 MED ORDER — ONDANSETRON HCL 4 MG/2ML IJ SOLN
INTRAMUSCULAR | Status: AC
  Administered 2017-11-18: 4 mg via INTRAVENOUS
  Filled 2017-11-18: qty 2

## 2017-11-18 MED ORDER — METOPROLOL SUCCINATE ER 50 MG PO TB24
50.0000 mg | ORAL_TABLET | Freq: Every day | ORAL | Status: DC
  Administered 2017-11-18 – 2017-11-21 (×4): 50 mg via ORAL
  Filled 2017-11-18 (×5): qty 1

## 2017-11-18 MED ORDER — DEXTROSE 5 % IV SOLN
1.0000 g | Freq: Every day | INTRAVENOUS | Status: DC
  Administered 2017-11-18 – 2017-11-21 (×4): 1 g via INTRAVENOUS
  Filled 2017-11-18 (×4): qty 10

## 2017-11-18 MED ORDER — FENTANYL CITRATE (PF) 100 MCG/2ML IJ SOLN
50.0000 ug | Freq: Once | INTRAMUSCULAR | Status: AC
  Administered 2017-11-18: 50 ug via INTRAVENOUS
  Filled 2017-11-18: qty 2

## 2017-11-18 MED ORDER — DOCUSATE SODIUM 100 MG PO CAPS
100.0000 mg | ORAL_CAPSULE | Freq: Two times a day (BID) | ORAL | Status: DC
  Administered 2017-11-18 – 2017-11-22 (×9): 100 mg via ORAL
  Filled 2017-11-18 (×9): qty 1

## 2017-11-18 MED ORDER — PRAMIPEXOLE DIHYDROCHLORIDE 1 MG PO TABS
1.0000 mg | ORAL_TABLET | Freq: Every day | ORAL | Status: DC
  Administered 2017-11-18 – 2017-11-21 (×4): 1 mg via ORAL
  Filled 2017-11-18 (×4): qty 1

## 2017-11-18 MED ORDER — TAMSULOSIN HCL 0.4 MG PO CAPS
0.4000 mg | ORAL_CAPSULE | Freq: Every day | ORAL | Status: DC
  Administered 2017-11-18 – 2017-11-21 (×4): 0.4 mg via ORAL
  Filled 2017-11-18 (×3): qty 1

## 2017-11-18 MED ORDER — MORPHINE SULFATE (PF) 4 MG/ML IV SOLN
INTRAVENOUS | Status: AC
  Administered 2017-11-18: 4 mg via INTRAVENOUS
  Filled 2017-11-18: qty 1

## 2017-11-18 MED ORDER — BUSPIRONE HCL 15 MG PO TABS
7.5000 mg | ORAL_TABLET | Freq: Every day | ORAL | Status: DC
  Administered 2017-11-18 – 2017-11-22 (×5): 7.5 mg via ORAL
  Filled 2017-11-18 (×5): qty 1

## 2017-11-18 MED ORDER — HEPARIN BOLUS VIA INFUSION
4900.0000 [IU] | Freq: Once | INTRAVENOUS | Status: AC
  Administered 2017-11-18: 4900 [IU] via INTRAVENOUS
  Filled 2017-11-18: qty 4900

## 2017-11-18 MED ORDER — ALBUTEROL SULFATE (2.5 MG/3ML) 0.083% IN NEBU
2.5000 mg | INHALATION_SOLUTION | RESPIRATORY_TRACT | Status: DC | PRN
  Administered 2017-11-19: 2.5 mg via RESPIRATORY_TRACT
  Filled 2017-11-18: qty 3

## 2017-11-18 MED ORDER — OXYCODONE HCL 5 MG PO TABS
5.0000 mg | ORAL_TABLET | ORAL | Status: DC | PRN
  Administered 2017-11-18 – 2017-11-19 (×4): 5 mg via ORAL
  Filled 2017-11-18 (×5): qty 1

## 2017-11-18 MED ORDER — POTASSIUM CHLORIDE CRYS ER 20 MEQ PO TBCR
20.0000 meq | EXTENDED_RELEASE_TABLET | Freq: Every day | ORAL | Status: DC
  Administered 2017-11-18 – 2017-11-21 (×4): 20 meq via ORAL
  Filled 2017-11-18 (×4): qty 1

## 2017-11-18 MED ORDER — SODIUM CHLORIDE 0.9% FLUSH
3.0000 mL | Freq: Two times a day (BID) | INTRAVENOUS | Status: DC
  Administered 2017-11-18 – 2017-11-22 (×9): 3 mL via INTRAVENOUS

## 2017-11-18 MED ORDER — FINASTERIDE 5 MG PO TABS
5.0000 mg | ORAL_TABLET | Freq: Every day | ORAL | Status: DC
  Administered 2017-11-18 – 2017-11-22 (×5): 5 mg via ORAL
  Filled 2017-11-18 (×5): qty 1

## 2017-11-18 MED ORDER — MORPHINE SULFATE (PF) 4 MG/ML IV SOLN
INTRAVENOUS | Status: AC
  Filled 2017-11-18: qty 1

## 2017-11-18 MED ORDER — ONDANSETRON HCL 4 MG/2ML IJ SOLN
4.0000 mg | Freq: Once | INTRAMUSCULAR | Status: AC
  Administered 2017-11-18: 4 mg via INTRAVENOUS

## 2017-11-18 MED ORDER — TORSEMIDE 10 MG PO TABS
40.0000 mg | ORAL_TABLET | Freq: Every day | ORAL | Status: DC
  Administered 2017-11-18 – 2017-11-21 (×4): 40 mg via ORAL
  Filled 2017-11-18: qty 2
  Filled 2017-11-18: qty 4
  Filled 2017-11-18 (×4): qty 2

## 2017-11-18 MED ORDER — POLYETHYLENE GLYCOL 3350 17 G PO PACK
17.0000 g | PACK | Freq: Every day | ORAL | Status: DC | PRN
  Administered 2017-11-19: 17 g via ORAL
  Filled 2017-11-18: qty 1

## 2017-11-18 MED ORDER — SODIUM CHLORIDE 0.9 % IV SOLN
INTRAVENOUS | Status: DC
  Administered 2017-11-18: 14:00:00 via INTRAVENOUS

## 2017-11-18 MED ORDER — IOPAMIDOL (ISOVUE-370) INJECTION 76%
75.0000 mL | Freq: Once | INTRAVENOUS | Status: AC | PRN
  Administered 2017-11-18: 75 mL via INTRAVENOUS

## 2017-11-18 MED ORDER — ONDANSETRON HCL 4 MG/2ML IJ SOLN: 4.0000 mg | Freq: Four times a day (QID) | INTRAMUSCULAR | Status: DC | PRN

## 2017-11-18 MED ORDER — DIGOXIN 125 MCG PO TABS
0.1250 mg | ORAL_TABLET | Freq: Every day | ORAL | Status: DC
  Filled 2017-11-18: qty 1

## 2017-11-18 NOTE — Progress Notes (Signed)
PT Cancellation Note  Patient Details Name: Ricardo Erickson MRN: [id number] DOB: May 23, 1941   Cancelled Treatment:    Reason Eval/Treat Not Completed: Medical issues which prohibited therapy(Consult received and chart reviewed.  Per chart, patient noted with multiple acute PE bilat, with associated R heart strain.  Anticoag initiated 12/8 at 1052.  Per policy, patient to complete 48 hours of anticoagulation prior to initiation of exertional activity.  Will hold at this time and re-attempt at later time/date (after 12/10 at 1052) as medically appropriate. )   Allana Shrestha H. Owens Shark, PT, DPT, NCS 11/18/17, 11:20 PM [phone]

## 2017-11-18 NOTE — ED Provider Notes (Signed)
Care signed over from Dr. Karma Greaser pending results of CT scan of the chest.  CT back showing multiple acute pulmonary embolism with right heart strain.  Radiologist recommends code PE which we have activated at this point.  Also shows acute rib fractures 5 through 10 on the left.  The patient is in some discomfort still so we will given 50 mcg of fentanyl.  ----------------------------------------- 8:20 AM on 11/18/2017 -----------------------------------------  I discussed with the intensivist at [facility] Dr. Nelda Marseille who recommends against catheter directed thrombolysis.  I then discussed with our hospitalist who is graciously agreed to admit the patient to his service.  CRITICAL CARE Performed by: Darel Hong   Total critical care time: 30 minutes  Critical care time was exclusive of separately billable procedures and treating other patients.  Critical care was necessary to treat or prevent imminent or life-threatening deterioration.  Critical care was time spent personally by me on the following activities: development of treatment plan with patient and/or surrogate as well as nursing, discussions with consultants, evaluation of patient's response to treatment, examination of patient, obtaining history from patient or surrogate, ordering and performing treatments and interventions, ordering and review of laboratory studies, ordering and review of radiographic studies, pulse oximetry and re-evaluation of patient's condition.    Darel Hong, MD 11/18/17 [phone]

## 2017-11-18 NOTE — ED Triage Notes (Signed)
Per EMS: patient has fallen twice in the last two days. Patient on floor at EMS arrival. Patient c/o increased back pain since fall. Patient reports hx of back surgery. Patient c/o increasing weakness in the last few days.

## 2017-11-18 NOTE — H&P (Signed)
[place] [facility] NAME: Ricardo Erickson    MR#:  [id number]  DATE OF BIRTH:  1941-01-17  DATE OF ADMISSION:  11/18/2017  PRIMARY CARE PHYSICIAN: Rusty Aus, MD   REQUESTING/REFERRING PHYSICIAN: Dr. Mable Paris  CHIEF COMPLAINT:   Chief Complaint  Patient presents with  . Weakness  . Fall   HISTORY OF PRESENT ILLNESS:  Ricardo Erickson  is a 76 y.o. male with a known history of pulmonary embolism, mild cognitive impairment, hypertension, COPD, congestive heart failure, chronic back pain presents to the emergency room due to weakness and falls.  He also complains of shortness of breath and chest pain.  A CTA of the chest showed multiple bilateral pulmonary emboli with right heart strain and rib fractures.  Patient mentions that he is compliant with his medications.  Although he is a poor historian compliance is questionable. No bleeding.  PAST MEDICAL HISTORY:   Past Medical History:  Diagnosis Date  . CHF (congestive heart failure) ([place])   . Chronic back pain   . Dementia   . Depression   . Emphysema lung ([place])   . Hypertension   . Personal history of tobacco use, presenting hazards to health 08/06/2015  . Pollen allergies   . PTSD (post-traumatic stress disorder)   . Pulmonary embolism ([place])     PAST SURGICAL HISTORY:   Past Surgical History:  Procedure Laterality Date  . BACK SURGERY    . KYPHOPLASTY N/A 06/27/2017   Procedure: KYPHOPLASTY -L5;  Surgeon: Hessie Knows, MD;  Location: [facility] ORS;  Service: Orthopedics;  Laterality: N/A;  . TONSILLECTOMY AND ADENOIDECTOMY  1980    SOCIAL HISTORY:   Social History   Tobacco Use  . Smoking status: Current Every Day Smoker    Packs/day: 1.00    Years: 60.00    Pack years: 60.00  . Smokeless tobacco: Never Used  Substance Use Topics  . Alcohol use: No    Alcohol/week: 0.0 oz    FAMILY HISTORY:   Family History  Problem Relation Age of Onset  . Alcohol abuse Father   .  Arthritis Mother   . Heart disease Maternal Grandmother   . Prostate cancer Brother     DRUG ALLERGIES:  No Known Allergies  REVIEW OF SYSTEMS:   Review of Systems  Constitutional: Positive for malaise/fatigue. Negative for chills and fever.  HENT: Negative for sore throat.   Eyes: Negative for blurred vision, double vision and pain.  Respiratory: Positive for cough and shortness of breath. Negative for hemoptysis and wheezing.   Cardiovascular: Positive for chest pain. Negative for palpitations, orthopnea and leg swelling.  Gastrointestinal: Negative for abdominal pain, constipation, diarrhea, heartburn, nausea and vomiting.  Genitourinary: Negative for dysuria and hematuria.  Musculoskeletal: Positive for falls. Negative for back pain and joint pain.  Skin: Negative for rash.  Neurological: Positive for weakness. Negative for sensory change, speech change, focal weakness and headaches.  Endo/Heme/Allergies: Does not bruise/bleed easily.  Psychiatric/Behavioral: Negative for depression. The patient is not nervous/anxious.     MEDICATIONS AT HOME:   Prior to Admission medications   Medication Sig Start Date End Date Taking? Authorizing Provider  acetaminophen (TYLENOL) 650 MG CR tablet Take 650 mg by mouth every 8 (eight) hours as needed for pain.    [provider]  albuterol (PROVENTIL HFA;VENTOLIN HFA) 108 (90 Base) MCG/ACT inhaler Inhale 2 puffs into the lungs as needed for wheezing or shortness of breath.    [provider]  albuterol (PROVENTIL) (2.5 MG/3ML) 0.083% nebulizer solution Inhale 2.5 mg into the lungs every 6 (six) hours as needed.  01/15/15   [provider]  amiodarone (PACERONE) 200 MG tablet Take 1 tablet (200 mg total) by mouth daily. 07/28/17   Henreitta Leber, MD  apixaban (ELIQUIS) 5 MG TABS tablet Take 10 mg PO BID X 4 days then take 5 mg PO BID 07/27/17   Sainani, Belia Heman, MD  busPIRone (BUSPAR) 7.5 MG tablet Take 1 tablet twice  daily for anxiety. 06/19/17   Pleas Koch, NP  cyclobenzaprine (FLEXERIL) 5 MG tablet Take 5 mg by mouth 2 (two) times daily as needed for muscle spasms. 07/18/17   [provider]  digoxin (LANOXIN) 0.125 MG tablet Take 1 tablet (0.125 mg total) by mouth daily. 06/30/17   Dustin Flock, MD  diltiazem (CARDIZEM CD) 240 MG 24 hr capsule Take 1 capsule (240 mg total) by mouth daily. 06/30/17   Dustin Flock, MD  finasteride (PROSCAR) 5 MG tablet Take 1 tablet (5 mg total) by mouth daily. 10/02/17   Festus Aloe, MD  Fluticasone-Salmeterol (ADVAIR DISKUS) 250-50 MCG/DOSE AEPB Inhale 1 puff into the lungs every 12 (twelve) hours. Reported on 04/01/2016    [provider]  furosemide (LASIX) 20 MG tablet Take 20 mg by mouth daily. 07/17/17 07/17/18  [provider]  lidocaine (LIDODERM) 5 % Place 1 patch onto the skin daily. Remove & Discard patch within 12 hours or as directed by MD 07/27/17   Henreitta Leber, MD  potassium chloride SA (K-DUR,KLOR-CON) 10 MEQ tablet Take 1 tablet (10 mEq total) by mouth daily. 07/28/17   Henreitta Leber, MD  pramipexole (MIRAPEX) 1 MG tablet Take 1 mg by mouth at bedtime.    [provider]  QUEtiapine (SEROQUEL) 25 MG tablet Take 1 tablet (25 mg total) by mouth at bedtime. 07/27/17   Henreitta Leber, MD  tamsulosin (FLOMAX) 0.4 MG CAPS capsule Take 0.4 mg by mouth daily.    [provider]  tiotropium (SPIRIVA) 18 MCG inhalation capsule Place 18 mcg into inhaler and inhale daily.    [provider]  traMADol (ULTRAM) 50 MG tablet Take 1 tablet (50 mg total) by mouth every 12 (twelve) hours as needed. 07/27/17   Henreitta Leber, MD     VITAL SIGNS:  Blood pressure (!) 120/97, pulse 83, temperature 97.7 F (36.5 C), temperature source Oral, resp. rate (!) 26, weight 81.6 kg (180 lb), SpO2 92 %.  PHYSICAL EXAMINATION:  Physical Exam  GENERAL:  76 y.o.-year-old patient lying in the bed with no acute distress.   EYES: Pupils equal, round, reactive to light and accommodation. No scleral icterus. Extraocular muscles intact.  HEENT: Head atraumatic, normocephalic. Oropharynx and nasopharynx clear. No oropharyngeal erythema, moist oral mucosa  NECK:  Supple, no jugular venous distention. No thyroid enlargement, no tenderness.  LUNGS: Normal breath sounds bilaterally, no wheezing, rales, rhonchi. No use of accessory muscles of respiration.  CARDIOVASCULAR: S1, S2 normal. No murmurs, rubs, or gallops.  ABDOMEN: Soft, nontender, nondistended. Bowel sounds present. No organomegaly or mass.  EXTREMITIES: No pedal edema, cyanosis, or clubbing. + 2 pedal & radial pulses b/l.   NEUROLOGIC: Cranial nerves II through XII are intact. No focal Motor or sensory deficits appreciated b/l PSYCHIATRIC: The patient is alert and awake. SKIN: No obvious rash, lesion, or ulcer.   LABORATORY PANEL:   CBC Recent Labs  Lab 11/18/17 0445  WBC 15.2*  HGB 13.6  HCT 41.3  PLT 299   ------------------------------------------------------------------------------------------------------------------  Chemistries  Recent Labs  Lab 11/18/17 0445  NA 135  K 3.7  CL 96*  CO2 28  GLUCOSE 118*  BUN 21*  CREATININE 1.32*  CALCIUM 9.3  AST 22  ALT 7*  ALKPHOS 80  BILITOT 0.3   ------------------------------------------------------------------------------------------------------------------  Cardiac Enzymes Recent Labs  Lab 11/18/17 0445  TROPONINI <0.03   ------------------------------------------------------------------------------------------------------------------  RADIOLOGY:  Dg Chest 2 View  Result Date: 11/18/2017 CLINICAL DATA:  76 year old male with shortness of breath. EXAM: CHEST  2 VIEW COMPARISON:  Chest radiograph dated 07/28/2017 FINDINGS: There is shallow inspiration with bibasilar atelectatic changes/ scarring. There is diffuse interstitial coarsening. No focal consolidation, pleural effusion, or  pneumothorax. The cardiac silhouette is within normal limits. There is atherosclerotic calcification of the the aortic arch. Osteopenia with degenerative changes of the spine. No acute osseous pathology. IMPRESSION: No active cardiopulmonary disease. Electronically Signed   By: Anner Crete M.D.   On: 11/18/2017 06:22   Ct Angio Chest Pe W/cm &/or Wo Cm  Result Date: 11/18/2017 CLINICAL DATA:  Per EMS: patient has fallen twice in the last two days. Patient on floor at EMS arrival. Patient c/o increased back pain since fall. Patient reports hx of back surgery. Patient c/o increasing weakness in the last few days. Hx: Emphysema, CHF. EXAM: CT ANGIOGRAPHY CHEST WITH CONTRAST TECHNIQUE: Multidetector CT imaging of the chest was performed using the standard protocol during bolus administration of intravenous contrast. Multiplanar CT image reconstructions and MIPs were obtained to evaluate the vascular anatomy. CONTRAST:  41mL ISOVUE-370 IOPAMIDOL (ISOVUE-370) INJECTION 76% COMPARISON:  Current chest radiograph.  Chest CTA, 07/22/2017 FINDINGS: Cardiovascular: There is satisfactory opacification of the central pulmonary arteries. Some mixed enhancement of the more peripheral pulmonary arteries, along with respiratory motion, limits assessment lower lung segmental pulmonary arteries. Allowing for this, there are not definitive pulmonary emboli. A nonocclusive pulmonary embolus is noted in the anterior segmental branch to the left upper lobe. Apparent pulmonary embolus is noted in the anterior signal branch to the right upper lobe. Pulmonary embolus is noted at the bifurcation of the right middle lobe pulmonary arteries. There are probable pulmonary emboli in the both lower lobe segmental pulmonary arteries. No evidence of a main pulmonary artery embolus. RV/LV ratio:  1.26 consistent with right heart strain. No pericardial effusion. Heart is borderline enlarged. There are stable three-vessel coronary artery  calcifications. Mild aortic atherosclerosis. No aortic dissection or aneurysm. Mediastinum/Nodes: No enlarged mediastinal, hilar, or axillary lymph nodes. Thyroid gland, trachea, and esophagus demonstrate no significant findings. Lungs/Pleura: Trace left pleural effusion. Patchy and linear type opacities are noted in the lower lobes and left upper lobe lingula consistent with atelectasis. No convincing pneumonia. No pulmonary edema. Moderate changes of centrilobular emphysema. No pneumothorax. Upper Abdomen: No acute findings. Musculoskeletal: There are multiple left lateral rib fractures, without significant displacement. There are fractures of the left lateral fifth through tenth ribs. No other acute fractures. Minor endplate depressions along the midthoracic spine stable from the prior CT angiogram. No osteoblastic or osteolytic lesions. Review of the MIP images confirms the above findings. IMPRESSION: 1. There are several segmental pulmonary emboli in both lungs, which are presumably acute. The pulmonary embolus noted in the left upper lobe apical segmental branch is clearly new from the prior CT angiogram dated 07/22/2017. 2. Positive for acute PE with CT evidence of right heart strain (RV/LV Ratio = 1.26) consistent with at least submassive (intermediate risk) PE.  The presence of right heart strain has been associated with an increased risk of morbidity and mortality. Please activate Code PE by paging [phone]. 3. Acute fractures of the left lateral fifth through tenth ribs. No lung contusion or laceration. No pneumothorax. Trace left pleural effusion. 4. Lung base atelectasis, but no evidence of pneumonia or pulmonary edema. 5. Emphysema and aortic atherosclerosis. Coronary artery calcifications. Aortic Atherosclerosis (ICD10-I70.0) and Emphysema (ICD10-J43.9). Electronically Signed   By: Lajean Manes M.D.   On: 11/18/2017 08:01     IMPRESSION AND PLAN:   * Bilateral pulmonary emboli while being on  Eliquis.  Will start heparin drip.  Consult oncology.  Check lower extremity venous Dopplers. Check echocardiogram.  *UTI.  Start IV ceftriaxone.  Culture sent and pending.  *Left 5-10 rib fractures No pneumothorax.  Symptomatic treatment.  Incentive spirometer.  Pain medications added.  *COPD.  No wheezing.  Nebulizers as needed.   All the records are reviewed and case discussed with ED provider. Management plans discussed with the patient, family and they are in agreement.  CODE STATUS: FULL CODE  TOTAL TIME TAKING CARE OF THIS PATIENT: 40 minutes.   Leia Alf Sumayya Muha M.D on 11/18/2017 at 8:55 AM  Between 7am to 6pm - Pager - [phone]  After 6pm go to [email or web address] - password [email or web address] [facility]  Office  [phone]  CC: Primary care physician; Rusty Aus, MD  Note: This dictation was prepared with Dragon dictation along with smaller phrase technology. Any transcriptional errors that result from this process are unintentional.

## 2017-11-18 NOTE — ED Notes (Signed)
Patient has lidocaine patch on left lateral back. Patient reports he has been wearing same patch for 2 days.

## 2017-11-18 NOTE — ED Notes (Signed)
Patient transported to [place]. Son at bedside.

## 2017-11-18 NOTE — ED Notes (Signed)
Patient's oxygen saturation decreased to 87% on RA. Patient placed on 2L [place] and instructed to breath in through nose. Patient verbalized understanding of instructions. patient currently 92% oxygen saturation on 2L [place]. RN will continue to monitor.

## 2017-11-18 NOTE — Consult Note (Signed)
[facility] CONSULT NOTE  Patient Care Team: Rusty Aus, MD as PCP - General (Internal Medicine)  CHIEF COMPLAINTS/PURPOSE OF CONSULTATION:  Recurrent PE  HISTORY OF PRESENTING ILLNESS: Patient is currently in pain; limited history; son by the bedside-good historian.  Ricardo Erickson 76 y.o.  male history of multiple medical problems including COPD/active smoking and a history of provoked PE in July 2018 after surgery.  Patient was adequately anticoagulated for approximately 6 months with Eliquis when he was taken off anticoagulation recently.  In general no history of falls as per family.   Patient lives by himself; while the family visits him on a daily basis.  Patient has been disoriented; no fevers reported.  He fell; brought him to the hospital.  CTA-showed bilateral PE; also rib fractures.  Patient is currently admitted to the hospital with IV heparin; pain control.  Patient currently has a Foley catheter-foul-smelling cloudy urine.  Currently patient does not have any provoking factors-for his new PE.  Also of note patient had a Foley catheter for retention of urine secondary to BPH that was taken out recently.   ROS: Unable to get full review of systems because of patient's pain.   MEDICAL HISTORY:  Past Medical History:  Diagnosis Date  . CHF (congestive heart failure) ([place])   . Chronic back pain   . Dementia   . Depression   . Emphysema lung ([place])   . Hypertension   . Personal history of tobacco use, presenting hazards to health 08/06/2015  . Pollen allergies   . PTSD (post-traumatic stress disorder)   . Pulmonary embolism ([place])     SURGICAL HISTORY: Past Surgical History:  Procedure Laterality Date  . BACK SURGERY    . KYPHOPLASTY N/A 06/27/2017   Procedure: KYPHOPLASTY -L5;  Surgeon: Hessie Knows, MD;  Location: [facility] ORS;  Service: Orthopedics;  Laterality: N/A;  . TONSILLECTOMY AND ADENOIDECTOMY  1980    SOCIAL HISTORY: Social History    Socioeconomic History  . Marital status: Widowed    Spouse name: Not on file  . Number of children: Not on file  . Years of education: Not on file  . Highest education level: Not on file  Social Needs  . Financial resource strain: Not on file  . Food insecurity - worry: Not on file  . Food insecurity - inability: Not on file  . Transportation needs - medical: Not on file  . Transportation needs - non-medical: Not on file  Occupational History  . Not on file  Tobacco Use  . Smoking status: Current Every Day Smoker    Packs/day: 1.00    Years: 60.00    Pack years: 60.00    Types: Cigarettes  . Smokeless tobacco: Never Used  Substance and Sexual Activity  . Alcohol use: No    Alcohol/week: 0.0 oz  . Drug use: No  . Sexual activity: Not Currently  Other Topics Concern  . Not on file  Social History Narrative   Widower.   2 children, 3 grandchildren, 1 great grandchild.   Retired. Now works as a Gaffer.   Enjoys relaxing, spending time with family, watching basketball.    FAMILY HISTORY: Family History  Problem Relation Age of Onset  . Alcohol abuse Father   . Arthritis Mother   . Heart disease Maternal Grandmother   . Prostate cancer Brother     ALLERGIES:  has No Known Allergies.  MEDICATIONS:  Current Facility-Administered Medications  Medication Dose Route Frequency Provider  Last Rate Last Dose  . 0.9 %  sodium chloride infusion   Intravenous Continuous Hillary Bow, MD 75 mL/hr at 11/18/17 1404    . acetaminophen (TYLENOL) tablet 650 mg  650 mg Oral Q6H PRN Hillary Bow, MD       Or  . acetaminophen (TYLENOL) suppository 650 mg  650 mg Rectal Q6H PRN Sudini, Srikar, MD      . albuterol (PROVENTIL) (2.5 MG/3ML) 0.083% nebulizer solution 2.5 mg  2.5 mg Nebulization Q2H PRN Sudini, Srikar, MD      . busPIRone (BUSPAR) tablet 7.5 mg  7.5 mg Oral Daily Sudini, Srikar, MD   7.5 mg at 11/18/17 1309  . cefTRIAXone (ROCEPHIN) 1 g in dextrose 5 % 50 mL IVPB  1 g  Intravenous Daily Sudini, Alveta Heimlich, MD 100 mL/hr at 11/18/17 1405 1 g at 11/18/17 1405  . docusate sodium (COLACE) capsule 100 mg  100 mg Oral BID Hillary Bow, MD   100 mg at 11/18/17 1146  . finasteride (PROSCAR) tablet 5 mg  5 mg Oral Daily Hillary Bow, MD   5 mg at 11/18/17 1308  . heparin ADULT infusion 100 units/mL (25000 units/247mL sodium chloride 0.45%)  1,300 Units/hr Intravenous Continuous Coffee, Donna Christen, RPH 13 mL/hr at 11/18/17 1053 1,300 Units/hr at 11/18/17 1053  . metoprolol succinate (TOPROL-XL) 24 hr tablet 50 mg  50 mg Oral Daily Hillary Bow, MD   50 mg at 11/18/17 1307  . morphine 2 MG/ML injection 2 mg  2 mg Intravenous Q4H PRN Sudini, Alveta Heimlich, MD      . ondansetron (ZOFRAN) tablet 4 mg  4 mg Oral Q6H PRN Hillary Bow, MD       Or  . ondansetron (ZOFRAN) injection 4 mg  4 mg Intravenous Q6H PRN Sudini, Alveta Heimlich, MD      . oxyCODONE (Oxy IR/ROXICODONE) immediate release tablet 5 mg  5 mg Oral Q4H PRN Hillary Bow, MD   5 mg at 11/18/17 1147  . polyethylene glycol (MIRALAX / GLYCOLAX) packet 17 g  17 g Oral Daily PRN Sudini, Srikar, MD      . potassium chloride SA (K-DUR,KLOR-CON) CR tablet 20 mEq  20 mEq Oral Daily Hillary Bow, MD   20 mEq at 11/18/17 1308  . pramipexole (MIRAPEX) tablet 1 mg  1 mg Oral QHS Sudini, Srikar, MD      . QUEtiapine (SEROQUEL) tablet 25 mg  25 mg Oral QHS Sudini, Srikar, MD      . sodium chloride flush (NS) 0.9 % injection 3 mL  3 mL Intravenous Q12H Sudini, Srikar, MD   3 mL at 11/18/17 1318  . tamsulosin (FLOMAX) capsule 0.4 mg  0.4 mg Oral QPC supper Sudini, Alveta Heimlich, MD      . torsemide [facility]) tablet 40 mg  40 mg Oral Daily Hillary Bow, MD   40 mg at 11/18/17 1308      .  PHYSICAL EXAMINATION:  Vitals:   11/18/17 1048 11/18/17 1219  BP: (!) 157/80 132/72  Pulse: 86 78  Resp:  (!) 22  Temp: 98.5 F (36.9 C) 98.6 F (37 C)  SpO2: 97% 93%   Filed Weights   11/18/17 0425 11/18/17 1048  Weight: 180 lb (81.6 kg) 205 lb  11.2 oz (93.3 kg)    GENERAL: Well-nourished well-developed; Alert, moderate distress and comfortable.   Accompanied by family. EYES: no pallor or icterus OROPHARYNX: no thrush or ulceration. NECK: supple, no masses felt LYMPH:  no palpable lymphadenopathy in the cervical, axillary or  inguinal regions LUNGS: decreased breath sounds to auscultation at bases and  No wheeze or crackles HEART/CVS: regular rate & rhythm and no murmurs; No lower extremity edema ABDOMEN: abdomen soft, non-tender and normal bowel sounds; Foley catheter in place. Musculoskeletal:no cyanosis of digits and no clubbing  PSYCH: alert & oriented x 3 with fluent speech NEURO: no focal motor/sensory deficits SKIN:  no rashes or significant lesions  LABORATORY DATA:  I have reviewed the data as listed Lab Results  Component Value Date   WBC 15.2 (H) 11/18/2017   HGB 13.6 11/18/2017   HCT 41.3 11/18/2017   MCV 91.3 11/18/2017   PLT 299 11/18/2017   Recent Labs    06/26/17 0906  07/19/17 1734  07/28/17 0354 10/01/17 1432 11/18/17 0445  NA  --   --  139   < > 140 138 135  K  --   --  3.4*   < > 3.2* 3.4* 3.7  CL  --   --  98*   < > 106 104 96*  CO2  --   --  30   < > 28 29 28   GLUCOSE  --   --  102*   < > 118* 102* 118*  BUN  --   --  15   < > 17 14 21*  CREATININE  --    < > 1.31*   < > 1.06 1.43* 1.32*  CALCIUM  --   --  9.4   < > 8.1* 9.0 9.3  GFRNONAA  --    < > 52*   < > >60 47* 51*  GFRAA  --    < > >60   < > >60 54* 60*  PROT 6.5  --  7.0  --   --   --  7.0  ALBUMIN 3.7  --  3.6  --   --   --  3.2*  AST 38  --  23  --   --   --  22  ALT 12*  --  11*  --   --   --  7*  ALKPHOS 79  --  94  --   --   --  80  BILITOT 1.3*  --  1.7*  --   --   --  0.3  BILIDIR 0.2  --   --   --   --   --  0.2  IBILI 1.1*  --   --   --   --   --  0.1*   < > = values in this interval not displayed.    RADIOGRAPHIC STUDIES: I have personally reviewed the radiological images as listed and agreed with the findings in  the report. Dg Chest 2 View  Result Date: 11/18/2017 CLINICAL DATA:  76 year old male with shortness of breath. EXAM: CHEST  2 VIEW COMPARISON:  Chest radiograph dated 07/28/2017 FINDINGS: There is shallow inspiration with bibasilar atelectatic changes/ scarring. There is diffuse interstitial coarsening. No focal consolidation, pleural effusion, or pneumothorax. The cardiac silhouette is within normal limits. There is atherosclerotic calcification of the the aortic arch. Osteopenia with degenerative changes of the spine. No acute osseous pathology. IMPRESSION: No active cardiopulmonary disease. Electronically Signed   By: Anner Crete M.D.   On: 11/18/2017 06:22   Ct Angio Chest Pe W/cm &/or Wo Cm  Result Date: 11/18/2017 CLINICAL DATA:  Per EMS: patient has fallen twice in the last two days. Patient on floor at EMS arrival. Patient c/o  increased back pain since fall. Patient reports hx of back surgery. Patient c/o increasing weakness in the last few days. Hx: Emphysema, CHF. EXAM: CT ANGIOGRAPHY CHEST WITH CONTRAST TECHNIQUE: Multidetector CT imaging of the chest was performed using the standard protocol during bolus administration of intravenous contrast. Multiplanar CT image reconstructions and MIPs were obtained to evaluate the vascular anatomy. CONTRAST:  87mL ISOVUE-370 IOPAMIDOL (ISOVUE-370) INJECTION 76% COMPARISON:  Current chest radiograph.  Chest CTA, 07/22/2017 FINDINGS: Cardiovascular: There is satisfactory opacification of the central pulmonary arteries. Some mixed enhancement of the more peripheral pulmonary arteries, along with respiratory motion, limits assessment lower lung segmental pulmonary arteries. Allowing for this, there are not definitive pulmonary emboli. A nonocclusive pulmonary embolus is noted in the anterior segmental branch to the left upper lobe. Apparent pulmonary embolus is noted in the anterior signal branch to the right upper lobe. Pulmonary embolus is noted at the  bifurcation of the right middle lobe pulmonary arteries. There are probable pulmonary emboli in the both lower lobe segmental pulmonary arteries. No evidence of a main pulmonary artery embolus. RV/LV ratio:  1.26 consistent with right heart strain. No pericardial effusion. Heart is borderline enlarged. There are stable three-vessel coronary artery calcifications. Mild aortic atherosclerosis. No aortic dissection or aneurysm. Mediastinum/Nodes: No enlarged mediastinal, hilar, or axillary lymph nodes. Thyroid gland, trachea, and esophagus demonstrate no significant findings. Lungs/Pleura: Trace left pleural effusion. Patchy and linear type opacities are noted in the lower lobes and left upper lobe lingula consistent with atelectasis. No convincing pneumonia. No pulmonary edema. Moderate changes of centrilobular emphysema. No pneumothorax. Upper Abdomen: No acute findings. Musculoskeletal: There are multiple left lateral rib fractures, without significant displacement. There are fractures of the left lateral fifth through tenth ribs. No other acute fractures. Minor endplate depressions along the midthoracic spine stable from the prior CT angiogram. No osteoblastic or osteolytic lesions. Review of the MIP images confirms the above findings. IMPRESSION: 1. There are several segmental pulmonary emboli in both lungs, which are presumably acute. The pulmonary embolus noted in the left upper lobe apical segmental branch is clearly new from the prior CT angiogram dated 07/22/2017. 2. Positive for acute PE with CT evidence of right heart strain (RV/LV Ratio = 1.26) consistent with at least submassive (intermediate risk) PE. The presence of right heart strain has been associated with an increased risk of morbidity and mortality. Please activate Code PE by paging [phone]. 3. Acute fractures of the left lateral fifth through tenth ribs. No lung contusion or laceration. No pneumothorax. Trace left pleural effusion. 4. Lung  base atelectasis, but no evidence of pneumonia or pulmonary edema. 5. Emphysema and aortic atherosclerosis. Coronary artery calcifications. Aortic Atherosclerosis (ICD10-I70.0) and Emphysema (ICD10-J43.9). Electronically Signed   By: Lajean Manes M.D.   On: 11/18/2017 08:01   US Renal  Result Date: 10/24/2017 CLINICAL DATA:  76 year old male with gross hematuria. Initial encounter. EXAM: RENAL / URINARY TRACT ULTRASOUND COMPLETE COMPARISON:  06/24/2017 CT. FINDINGS: Right Kidney: Length: 13.2 cm.  Moderate hydronephrosis.  No mass identified. Left Kidney: Length: 13.3 cm.  Moderate hydronephrosis.  No mass identified. Bladder: Enlarged prostate gland impresses upon the bladder base. Mild circumferential bladder wall thickening. Bilateral ureteral jets are noted. IMPRESSION: Moderate bilateral hydronephrosis new from prior CT and of indeterminate etiology. Enlarged prostate gland impresses upon the bladder base. Clinical and laboratory correlation recommended. Mild circumferential bladder wall thickening. Bilateral ureteral jets are noted. These results will be called to the ordering clinician or representative by the Radiologist Assistant,  and communication documented in the PACS or zVision Dashboard. Electronically Signed   By: Genia Del M.D.   On: 10/24/2017 13:50   US Venous Img Lower Bilateral  Result Date: 11/18/2017 CLINICAL DATA:  Pulmonary emboli EXAM: BILATERAL LOWER EXTREMITY VENOUS DOPPLER ULTRASOUND TECHNIQUE: Gray-scale sonography with compression, as well as color and duplex ultrasound, were performed to evaluate the deep venous system from the level of the common femoral vein through the popliteal and proximal calf veins. COMPARISON:  None FINDINGS: On the left, Normal compressibility of the common femoral, superficial femoral, and popliteal veins, as well as the proximal calf veins. No filling defects to suggest DVT on grayscale or color Doppler imaging. Doppler waveforms show normal  direction of venous flow, normal respiratory phasicity and response to augmentation. Visualized segments of the saphenous venous system normal in caliber and compressibility. On the right, the common femoral, deep femoral, femoral, and popliteal veins are unremarkable. The peroneal vein is noncompressible, with no color Doppler signal to indicate flow. IMPRESSION: 1. Isolated Right peroneal occlusive DVT. 2. Negative for left lower extremity DVT. No evidence of  lower extremity deep vein thrombosis. Electronically Signed   By: Lucrezia Europe M.D.   On: 11/18/2017 10:25    ASSESSMENT & PLAN:   # 76 year old male patient with COPD/active smoker currently admitted the hospital for fall/acute PE-on IV heparin  # Bilateral acute PE with right heart strain-with right lower extremity DVT-on IV heparin-unprovoked.  This is a second episode; although previous episode was thought to be provoked/post surgery.  Given the significant clot burden/right heart strain-recommend long-term anticoagulation.  Patient could  be transitioned to oral anticoagulation when acute issues resolve.   #History of hematuria-secondary to BPH/Foley catheter; currently resolved.   #Urinary tract infection-possibly leading to delirium/fall.  On ceftriaxone.  #Rib fractures-secondary to fall-pain control.  Thank you Dr. Darvin Neighbours for allowing me to participate in the care of your pleasant patient. Please do not hesitate to contact me with questions or concerns in the interim.  Discussed with the patient's son regarding the above plan in detail.  All questions were answered. The patient knows to call the clinic with any problems, questions or concerns.    Cammie Sickle, MD 11/18/2017 2:25 PM

## 2017-11-18 NOTE — ED Notes (Signed)
Attempted to call report x 1  

## 2017-11-18 NOTE — Progress Notes (Signed)
Recurrent PE. Questionable compliance with eliquis Rib fractures heparin drip and oncology consult. Echo. LE dopplers.

## 2017-11-18 NOTE — ED Notes (Signed)
Attempted to call report x2. Patient remains in [place] at this time.

## 2017-11-18 NOTE — Progress Notes (Signed)
ANTICOAGULATION CONSULT NOTE - Initial Consult  Pharmacy Consult for heparin gtt Indication: pulmonary embolus  No Known Allergies  Patient Measurements: Weight: 180 lb (81.6 kg) Heparin Dosing Weight: 81.6kg   Vital Signs: Temp: 97.7 F (36.5 C) (12/08 0425) Temp Source: Oral (12/08 0425) BP: 120/97 (12/08 0830) Pulse Rate: 83 (12/08 0830)  Labs: Recent Labs    11/18/17 0445  HGB 13.6  HCT 41.3  PLT 299  CREATININE 1.32*  TROPONINI <0.03    Estimated Creatinine Clearance: 49.1 mL/min (A) (by C-G formula based on SCr of 1.32 mg/dL (H)).   Medical History: Past Medical History:  Diagnosis Date  . CHF (congestive heart failure) ([place])   . Chronic back pain   . Dementia   . Depression   . Emphysema lung ([place])   . Hypertension   . Personal history of tobacco use, presenting hazards to health 08/06/2015  . Pollen allergies   . PTSD (post-traumatic stress disorder)   . Pulmonary embolism ([facility])     Medications:   (Not in a hospital admission) Scheduled:  . docusate sodium  100 mg Oral BID  . heparin  4,900 Units Intravenous Once  . sodium chloride flush  3 mL Intravenous Q12H   Infusions:  . heparin     PRN: acetaminophen **OR** acetaminophen, albuterol, morphine injection, ondansetron **OR** ondansetron (ZOFRAN) IV, oxyCODONE, polyethylene glycol Anti-infectives (From admission, onward)   None      Assessment: 76 year old male requiring heparin anticoagulation for the treatment of PE.  Goal of Therapy:  Heparin level 0.3-0.7 units/ml Monitor platelets by anticoagulation protocol: Yes   Plan:  Give 4900 units bolus x 1 Start heparin infusion at 1300 units/hr Check anti-Xa level in 6 hours and daily while on heparin Continue to monitor H&H and platelets  Donna Christen Lynn Sissel 11/18/2017,10:06 AM

## 2017-11-18 NOTE — Clinical Social Work Note (Signed)
CSW received consult for "[facility] or rehab". CSW will follow the patient pending PT recommendations.   Santiago Bumpers, MSW, Latanya Presser [phone]

## 2017-11-18 NOTE — ED Notes (Signed)
Attempted second IV placement x3. Unsuccessful.

## 2017-11-18 NOTE — ED Notes (Signed)
Lidocaine patch removed.

## 2017-11-18 NOTE — Progress Notes (Signed)
Chaplain received a consult for this pt who had requested for prayer. [place] met pt and son at bedside. Pt appeared confused and in pain. However, pt talked about things that are important to him. Pt spoke about his recurring health challenges and fracturing of his 6 rids,  the loss of his wife who passed about 74 y.o ago. Pt also talked about his son, his daughter, his grandchildren and greatgrandchildren. Pt said he is afraid of being alone. Pt wants someone to be present in the room all time. Pt is reminiscing on the good time he spent with his wife whom he described as a "perfect Christian women who loved all people the same." Pt asked [place] to offer pray for his health, which the [facility] provided with a ministry of presence. Before the [facility] left the Rm, pt asked [place] to come and see him again soon. [place] agreed and will follow up pt as needed.    11/18/17 1406  Clinical Encounter Type  Visited With Patient;Patient and family together  Visit Type Initial;Spiritual support  Referral From Nurse  Consult/Referral To Chaplain  Spiritual Encounters  Spiritual Needs Prayer  Advance Directives (For [place] and Now in Chart Copy in chart

## 2017-11-18 NOTE — ED Notes (Signed)
Patient transported to X-ray 

## 2017-11-18 NOTE — ED Notes (Signed)
ED Provider at bedside. 

## 2017-11-18 NOTE — ED Provider Notes (Signed)
[facility] Emergency Department Provider Note  ____________________________________________   First MD Initiated Contact with Patient 11/18/17 [phone]     (approximate)  I have reviewed the triage vital signs and the nursing notes.   HISTORY  Chief Complaint Weakness and Fall  Level 5 caveat:  history/ROS limited by acute/critical illness  HPI Ricardo Erickson is a 76 y.o. male with an extensive past medical history as listed below who presents by EMS for chest pain, generalized weakness, and recent falls.  He is unable to provide much history and is a vague historian, but he reports that he has had several recent falls including a bad fall 2 days ago in which he landed on his left side including his rib cage.  Since that time he has had more falls and has been increasingly weak all over and having severe spasms resulting in pain that shoots from the left side of his rib cage around to his back.  Moving around makes the pain worse and nothing makes it better.  He states he lives alone and his family does not check on him and he needs to be living with someone to help take care of him.  He reports feeling short of breath particularly with the pain.  Of note EMS found that the patient was hypoxemic at rest on room air and he does not have oxygen at home or a history of an oxygen requirement.  He denies fever/chills, nausea, vomiting, and abdominal pain.  He has had no numbness or weakness specifically in any one extremity, just generally feeling ill.  Past Medical History:  Diagnosis Date  . CHF (congestive heart failure) ([place])   . Chronic back pain   . Depression   . Emphysema lung ([place])   . Hypertension   . Personal history of tobacco use, presenting hazards to health 08/06/2015  . Pollen allergies   . PTSD (post-traumatic stress disorder)     Patient Active Problem List   Diagnosis Date Noted  . Tobacco use disorder 07/24/2017  . Encephalopathy   . Palliative  care encounter   . Goals of care, counseling/discussion   . Pulmonary embolus ([place])   . UTI (urinary tract infection) 07/19/2017  . PTSD (post-traumatic stress disorder) 06/28/2017  . Adjustment disorder with anxiety 06/28/2017  . Closed compression fracture of L5 lumbar vertebra ([place])   . Left low back pain   . Weakness   . A-fib ([place]) 06/24/2017  . Prediabetes 01/18/2017  . Prostate cancer ([place]) 01/18/2017  . Anxiety 01/18/2017  . Preventative health care 04/15/2016  . Restless leg syndrome 04/03/2016  . COPD (chronic obstructive pulmonary disease) ([place]) 04/03/2016  . BPH (benign prostatic hyperplasia) 04/03/2016  . Personal history of tobacco use, presenting hazards to health 08/06/2015    Past Surgical History:  Procedure Laterality Date  . BACK SURGERY    . KYPHOPLASTY N/A 06/27/2017   Procedure: KYPHOPLASTY -L5;  Surgeon: Hessie Knows, MD;  Location: [facility] ORS;  Service: Orthopedics;  Laterality: N/A;  . [place]    Prior to Admission medications   Medication Sig Start Date End Date Taking? Authorizing Provider  acetaminophen (TYLENOL) 650 MG CR tablet Take 650 mg by mouth every 8 (eight) hours as needed for pain.    [provider]  albuterol (PROVENTIL HFA;VENTOLIN HFA) 108 (90 Base) MCG/ACT inhaler Inhale 2 puffs into the lungs as needed for wheezing or shortness of breath.    [provider]  albuterol (PROVENTIL) (2.5 MG/3ML) 0.083% nebulizer solution Inhale 2.5 mg into the lungs every 6 (six) hours as needed.  01/15/15   [provider]  amiodarone (PACERONE) 200 MG tablet Take 1 tablet (200 mg total) by mouth daily. 07/28/17   Henreitta Leber, MD  apixaban (ELIQUIS) 5 MG TABS tablet Take 10 mg PO BID X 4 days then take 5 mg PO BID 07/27/17   Sainani, Belia Heman, MD  busPIRone (BUSPAR) 7.5 MG tablet Take 1 tablet twice daily for anxiety. 06/19/17   Pleas Koch, NP  cyclobenzaprine (FLEXERIL) 5 MG tablet Take 5 mg by  mouth 2 (two) times daily as needed for muscle spasms. 07/18/17   [provider]  digoxin (LANOXIN) 0.125 MG tablet Take 1 tablet (0.125 mg total) by mouth daily. 06/30/17   Dustin Flock, MD  diltiazem (CARDIZEM CD) 240 MG 24 hr capsule Take 1 capsule (240 mg total) by mouth daily. 06/30/17   Dustin Flock, MD  finasteride (PROSCAR) 5 MG tablet Take 1 tablet (5 mg total) by mouth daily. 10/02/17   Festus Aloe, MD  Fluticasone-Salmeterol (ADVAIR DISKUS) 250-50 MCG/DOSE AEPB Inhale 1 puff into the lungs every 12 (twelve) hours. Reported on 04/01/2016    [provider]  furosemide (LASIX) 20 MG tablet Take 20 mg by mouth daily. 07/17/17 07/17/18  [provider]  lidocaine (LIDODERM) 5 % Place 1 patch onto the skin daily. Remove & Discard patch within 12 hours or as directed by MD 07/27/17   Henreitta Leber, MD  polyethylene glycol (MIRALAX / GLYCOLAX) packet Take 17 g by mouth daily as needed for mild constipation. Patient not taking: Reported on 07/19/2017 06/29/17   Dustin Flock, MD  potassium chloride SA (K-DUR,KLOR-CON) 10 MEQ tablet Take 1 tablet (10 mEq total) by mouth daily. 07/28/17   Henreitta Leber, MD  pramipexole (MIRAPEX) 1 MG tablet Take 1 mg by mouth at bedtime.    [provider]  QUEtiapine (SEROQUEL) 25 MG tablet Take 1 tablet (25 mg total) by mouth at bedtime. 07/27/17   Henreitta Leber, MD  tamsulosin (FLOMAX) 0.4 MG CAPS capsule Take 0.4 mg by mouth daily.    [provider]  tiotropium (SPIRIVA) 18 MCG inhalation capsule Place 18 mcg into inhaler and inhale daily.    [provider]  traMADol (ULTRAM) 50 MG tablet Take 1 tablet (50 mg total) by mouth every 12 (twelve) hours as needed. 07/27/17   Henreitta Leber, MD    Allergies Patient has no known allergies.  Family History  Problem Relation Age of Onset  . Alcohol abuse Father   . Arthritis Mother   . Heart disease Maternal Grandmother   . Prostate cancer Brother      Social History Social History   Tobacco Use  . Smoking status: Current Every Day Smoker    Packs/day: 1.00    Years: 60.00    Pack years: 60.00  . Smokeless tobacco: Never Used  Substance Use Topics  . Alcohol use: No    Alcohol/week: 0.0 oz  . Drug use: No    Review of Systems Level 5 caveat secondary to acute illness and the patient being a vague historian, but in short the patient reports generalized weakness and left-sided chest wall pain as well as shortness of breath ____________________________________________   PHYSICAL EXAM:  VITAL SIGNS: ED Triage Vitals  Enc Vitals Group     BP 11/18/17 0425 (!) 145/93     Pulse Rate  11/18/17 0425 83     Resp 11/18/17 0425 18     Temp 11/18/17 0425 97.7 F (36.5 C)     Temp Source 11/18/17 0425 Oral     SpO2 11/18/17 0425 97 %     Weight 11/18/17 0425 81.6 kg (180 lb)     Height --      Head Circumference --      Peak Flow --      Pain Score 11/18/17 0424 10     Pain Loc --      Pain Edu? --      Excl. in [place]? --     Constitutional: Alert and oriented.  Appears chronically ill and is reporting acute pain and difficulty breathing when not on oxygen by nasal cannula Eyes: Conjunctivae are normal.  Head: Atraumatic. Nose: No congestion/rhinnorhea. Mouth/Throat: Mucous membranes are moist. Neck: No stridor.  No meningeal signs.   Cardiovascular: Normal rate, regular rhythm. Good peripheral circulation. Grossly normal heart sounds.  Severe tenderness to palpation of the left lower rib cage Respiratory: Increased respiratory effort, possibly due to pain and muscle spasms.  No retractions. Lungs CTAB. Gastrointestinal: Soft and nontender. No distention.  Musculoskeletal: No lower extremity tenderness nor edema. No gross deformities of extremities. Neurologic:  Normal speech and language. No gross focal neurologic deficits are appreciated.  Skin:  Skin is warm, dry and intact. No rash noted. Psychiatric: Mood is anxious  with an odd affect but no indication for psychiatric admission or further psychiatric evaluation  ____________________________________________   LABS (all labs ordered are listed, but only abnormal results are displayed)  Labs Reviewed  BASIC METABOLIC PANEL - Abnormal; Notable for the following components:      Result Value   Chloride 96 (*)    Glucose, Bld 118 (*)    BUN 21 (*)    Creatinine, Ser 1.32 (*)    GFR calc non Af Amer 51 (*)    GFR calc Af Amer 60 (*)    All other components within normal limits  CBC - Abnormal; Notable for the following components:   WBC 15.2 (*)    RDW 14.7 (*)    All other components within normal limits  HEPATIC FUNCTION PANEL - Abnormal; Notable for the following components:   Albumin 3.2 (*)    ALT 7 (*)    Indirect Bilirubin 0.1 (*)    All other components within normal limits  TROPONIN I  BRAIN NATRIURETIC PEPTIDE  URINALYSIS, COMPLETE (UACMP) WITH MICROSCOPIC   ____________________________________________  EKG  ED ECG REPORT I, Hinda Kehr, the attending physician, personally viewed and interpreted this ECG.  Date: 11/18/2017 EKG Time: 4:30 Rate: 77 Rhythm: sinus rhythm QRS Axis: normal Intervals: RBBB ST/T Wave abnormalities: Non-specific ST segment / T-wave changes, but no evidence of acute ischemia. Narrative Interpretation: no evidence of acute ischemia   ____________________________________________  RADIOLOGY   Dg Chest 2 View  Result Date: 11/18/2017 CLINICAL DATA:  76 year old male with shortness of breath. EXAM: CHEST  2 VIEW COMPARISON:  Chest radiograph dated 07/28/2017 FINDINGS: There is shallow inspiration with bibasilar atelectatic changes/ scarring. There is diffuse interstitial coarsening. No focal consolidation, pleural effusion, or pneumothorax. The cardiac silhouette is within normal limits. There is atherosclerotic calcification of the the aortic arch. Osteopenia with degenerative changes of the spine. No  acute osseous pathology. IMPRESSION: No active cardiopulmonary disease. Electronically Signed   By: Anner Crete M.D.   On: 11/18/2017 06:22   ____________________________________________   PROCEDURES  Critical Care performed: No   Procedure(s) performed:   Procedures   ____________________________________________   INITIAL IMPRESSION / ASSESSMENT AND PLAN / ED COURSE  As part of my medical decision making, I reviewed the following data within the [place] notes reviewed and incorporated, Labs reviewed , EKG interpreted , Old chart reviewed and Patient signed out to Dr. Mable Paris    Differential diagnosis includes, but is not limited to, ACS, aortic dissection, pulmonary embolism, cardiac tamponade, pneumothorax, pneumonia, pericarditis, myocarditis, GI-related causes including esophagitis/gastritis, and musculoskeletal chest wall pain.    I reviewed the patient's extensive medical record.  In short, about 5 months ago he had a kyphoplasty.  The month after that he was admitted to this hospital with bilateral pulmonary emboli and bilateral pneumonia.  He had a significant clinical decline at that point and required additional care.  However he does not use supplemental oxygen chronically but today he is hypoxemic.  While I was at bedside verifying the patient his nasal cannula was off and he was satting 87% with a good waveform.  I am concerned about the possibility of acute rib fractures and possibly the development of interstitial pneumonia in spite of the fact that the radiologist did not read any acute findings on the chest x-ray.  Additionally given that he had severe pulmonary emboli in the past I think it is also worth ruling this out.  I have ordered a CTA of the chest for bony traumatic injuries as well as to rule out pneumonia and PE.  The patient does have a leukocytosis of about 15 and his lab work is otherwise unremarkable with no evidence of CHF  exacerbation to explain the hypoxemia.  Clinical Course as of Nov 18 806  Sat Nov 18, 2017  0627 No acute abnormalities DG Chest 2 View [CF]  [phone] Given the patient's acute respiratory failure with hypoxemia, I anticipate the patient will need admission regardless of CTA findings.  Transferring ED care to Dr. Mable Paris to follow up CTA and disposition appropriately.  [CF]    Clinical Course User Index [CF] Hinda Kehr, MD    ____________________________________________  FINAL CLINICAL IMPRESSION(S) / ED DIAGNOSES  Final diagnoses:  Acute respiratory failure with hypoxemia ([place])  Left-sided chest wall pain  Fall, initial encounter     MEDICATIONS GIVEN DURING THIS VISIT:  Medications  morphine 4 MG/ML injection 4 mg (4 mg Intravenous Given 11/18/17 0449)  ondansetron (ZOFRAN) injection 4 mg (4 mg Intravenous Given 11/18/17 0448)  morphine 4 MG/ML injection 4 mg (4 mg Intravenous Given 11/18/17 0651)  iopamidol (ISOVUE-370) 76 % injection 75 mL (75 mLs Intravenous Contrast Given 11/18/17 0715)     ED Discharge Orders    None       Note:  This document was prepared using Dragon voice recognition software and may include unintentional dictation errors.    Hinda Kehr, MD 11/18/17 [phone]

## 2017-11-18 NOTE — ED Notes (Signed)
Patient transported to CT 

## 2017-11-18 NOTE — ED Notes (Signed)
Per patient request patients son called and updated on plan of care. Son states he will be here in about an hour.

## 2017-11-19 ENCOUNTER — Inpatient Hospital Stay (HOSPITAL_COMMUNITY)
Admit: 2017-11-19 | Discharge: 2017-11-19 | Disposition: A | Discharge status: Home / Self Care | Payer: Medicare Other | Attending: Internal Medicine | Admitting: Internal Medicine

## 2017-11-19 DIAGNOSIS — I5030 Unspecified diastolic (congestive) heart failure: Secondary | ICD-10-CM

## 2017-11-19 LAB — CBC
HEMATOCRIT: 38.8 % — AB (ref 40.0–52.0)
HEMOGLOBIN: 13.2 g/dL (ref 13.0–18.0)
MCH: 31 pg (ref 26.0–34.0)
MCHC: 34.1 g/dL (ref 32.0–36.0)
MCV: 90.8 fL (ref 80.0–100.0)
Platelets: 315 10*3/uL (ref 150–440)
RBC: 4.27 MIL/uL — ABNORMAL LOW (ref 4.40–5.90)
RDW: 14.6 % — ABNORMAL HIGH (ref 11.5–14.5)
WBC: 15.6 10*3/uL — ABNORMAL HIGH (ref 3.8–10.6)

## 2017-11-19 LAB — ECHOCARDIOGRAM COMPLETE
Height: 69 in
Weight: 3266 oz

## 2017-11-19 LAB — HEPARIN LEVEL (UNFRACTIONATED)
HEPARIN UNFRACTIONATED: 0.34 [IU]/mL (ref 0.30–0.70)
Heparin Unfractionated: 0.29 IU/mL — ABNORMAL LOW (ref 0.30–0.70)

## 2017-11-19 MED ORDER — HEPARIN BOLUS VIA INFUSION
2700.0000 [IU] | Freq: Once | INTRAVENOUS | Status: AC
  Administered 2017-11-19: 03:00:00 2700 [IU] via INTRAVENOUS
  Filled 2017-11-19: qty 2700

## 2017-11-19 MED ORDER — OXYCODONE HCL 5 MG PO TABS
10.0000 mg | ORAL_TABLET | ORAL | Status: DC | PRN
  Administered 2017-11-19 – 2017-11-22 (×10): 10 mg via ORAL
  Filled 2017-11-19 (×10): qty 2

## 2017-11-19 MED ORDER — HEPARIN BOLUS VIA INFUSION
1350.0000 [IU] | Freq: Once | INTRAVENOUS | Status: AC
  Administered 2017-11-19: 1350 [IU] via INTRAVENOUS
  Filled 2017-11-19: qty 1350

## 2017-11-19 MED ORDER — CYCLOBENZAPRINE HCL 10 MG PO TABS
5.0000 mg | ORAL_TABLET | Freq: Three times a day (TID) | ORAL | Status: DC | PRN
  Administered 2017-11-19 – 2017-11-20 (×3): 5 mg via ORAL
  Filled 2017-11-19 (×3): qty 1

## 2017-11-19 MED ORDER — MORPHINE SULFATE (PF) 4 MG/ML IV SOLN
4.0000 mg | INTRAVENOUS | Status: DC | PRN
  Administered 2017-11-19 – 2017-11-22 (×5): 4 mg via INTRAVENOUS
  Filled 2017-11-19 (×5): qty 1

## 2017-11-19 NOTE — Progress Notes (Signed)
ANTICOAGULATION CONSULT NOTE - Follow Up Consult  Pharmacy Consult for heparin gtt Indication: pulmonary embolus  No Known Allergies  Patient Measurements: Height: 5\' 9"  (175.3 cm) Weight: 204 lb 2 oz (92.6 kg)(bedscale) IBW/kg (Calculated) : 70.7 Heparin Dosing Weight: 81.6kg   Vital Signs: Temp: 98.7 F (37.1 C) (12/09 1449) Temp Source: Oral (12/09 1449) BP: 114/57 (12/09 1449) Pulse Rate: 71 (12/09 1449)  Labs: Recent Labs    11/18/17 0445 11/18/17 1056 11/18/17 1613 11/19/17 1052 11/19/17 1854  HGB 13.6  --   --  13.2  --   HCT 41.3  --   --  38.8*  --   PLT 299  --   --  315  --   APTT  --  47*  --   --   --   LABPROT  --  14.7  --   --   --   INR  --  1.16  --   --   --   HEPARINUNFRC  --   --  0.13* 0.34 0.29*  CREATININE 1.32*  --   --   --   --   TROPONINI <0.03  --   --   --   --     Estimated Creatinine Clearance: 55.2 mL/min (A) (by C-G formula based on SCr of 1.32 mg/dL (H)).   Medical History: Past Medical History:  Diagnosis Date  . CHF (congestive heart failure) ([place])   . Chronic back pain   . Dementia   . Depression   . Emphysema lung ([place])   . Hypertension   . Personal history of tobacco use, presenting hazards to health 08/06/2015  . Pollen allergies   . PTSD (post-traumatic stress disorder)   . Pulmonary embolism ([facility])     Medications:  Facility-Administered Medications Prior to Admission  Medication Dose Route Frequency Provider Last Rate Last Dose  . ciprofloxacin (CIPRO) tablet 500 mg  500 mg Oral Once Nickie Retort, MD      . lidocaine (XYLOCAINE) 2 % jelly 1 application  1 application Urethral Once Nickie Retort, MD       Medications Prior to Admission  Medication Sig Dispense Refill Last Dose  . apixaban (ELIQUIS) 5 MG TABS tablet Take 10 mg PO BID X 4 days then take 5 mg PO BID (Patient taking differently: Take 10 mg by mouth 2 (two) times daily. ) 60 tablet  N/A at N/A  . aspirin 81 MG EC tablet Take 81 mg by  mouth daily. Swallow whole.   11/17/2017 at Unknown time  . busPIRone (BUSPAR) 7.5 MG tablet Take 1 tablet twice daily for anxiety. (Patient taking differently: Take 7.5 mg by mouth daily. Take 1 tablet twice daily for anxiety.) 180 tablet 0 11/17/2017 at Unknown time  . finasteride (PROSCAR) 5 MG tablet Take 1 tablet (5 mg total) by mouth daily. 30 tablet 11 11/17/2017 at N/A  . lidocaine (LIDODERM) 5 % Place 1 patch onto the skin daily. Remove & Discard patch within 12 hours or as directed by MD 30 patch 0 11/17/2017 at AM  . metoprolol succinate (TOPROL-XL) 50 MG 24 hr tablet Take 50 mg by mouth daily. Take with or immediately following a meal.   11/17/2017 at N/A  . potassium chloride SA (K-DUR,KLOR-CON) 10 MEQ tablet Take 1 tablet (10 mEq total) by mouth daily. (Patient taking differently: Take 20 mEq by mouth daily. )   11/17/2017 at N/A  . QUEtiapine (SEROQUEL) 25 MG tablet Take 1 tablet (  25 mg total) by mouth at bedtime.   11/17/2017 at N/A  . tamsulosin (FLOMAX) 0.4 MG CAPS capsule Take 0.4 mg by mouth daily after supper.    11/17/2017 at N/A  . torsemide (DEMADEX) 20 MG tablet Take 40 mg by mouth daily.   11/17/2017 at N/A  . traMADol (ULTRAM) 50 MG tablet Take 1 tablet (50 mg total) by mouth every 12 (twelve) hours as needed. 30 tablet 0 11/17/2017 at N/A  . acetaminophen (TYLENOL) 650 MG CR tablet Take 650 mg by mouth every 8 (eight) hours as needed for pain.   PRN at PRN  . albuterol (PROVENTIL HFA;VENTOLIN HFA) 108 (90 Base) MCG/ACT inhaler Inhale 2 puffs into the lungs as needed for wheezing or shortness of breath.   PRN at PRN  . albuterol (PROVENTIL) (2.5 MG/3ML) 0.083% nebulizer solution Inhale 2.5 mg into the lungs every 6 (six) hours as needed.    PRN at PRN  . digoxin (LANOXIN) 0.125 MG tablet Take 1 tablet (0.125 mg total) by mouth daily. (Patient not taking: Reported on 11/18/2017)   Not Taking at Unknown time  . diltiazem (CARDIZEM CD) 240 MG 24 hr capsule Take 1 capsule (240 mg total) by  mouth daily. (Patient not taking: Reported on 11/18/2017)   Not Taking at Unknown time   Scheduled:  . busPIRone  7.5 mg Oral Daily  . docusate sodium  100 mg Oral BID  . finasteride  5 mg Oral Daily  . metoprolol succinate  50 mg Oral Daily  . potassium chloride  20 mEq Oral Daily  . pramipexole  1 mg Oral QHS  . QUEtiapine  25 mg Oral QHS  . sodium chloride flush  3 mL Intravenous Q12H  . tamsulosin  0.4 mg Oral QPC supper  . torsemide  40 mg Oral Daily   Infusions:  . cefTRIAXone (ROCEPHIN)  IV Stopped (11/19/17 0851)  . heparin 1,450 Units/hr (11/19/17 0252)   PRN: acetaminophen **OR** acetaminophen, albuterol, cyclobenzaprine, morphine injection, ondansetron **OR** ondansetron (ZOFRAN) IV, oxyCODONE, polyethylene glycol Anti-infectives (From admission, onward)   Start     Dose/Rate Route Frequency Ordered Stop   11/18/17 1400  cefTRIAXone (ROCEPHIN) 1 g in dextrose 5 % 50 mL IVPB     1 g 100 mL/hr over 30 Minutes Intravenous Daily 11/18/17 1227        Assessment: 76 year old male requiring heparin anticoagulation for the treatment of PE.  Goal of Therapy:  Heparin level 0.3-0.7 units/ml Monitor platelets by anticoagulation protocol: Yes   Plan:  Give 4900 units bolus x 1 Start heparin infusion at 1300 units/hr Check anti-Xa level in 6 hours and daily while on heparin Continue to monitor H&H and platelets   12/8 @ 1600 HL 0.13 subtherapeutic. This level wasn't addressed. Rebolusing w/ heparin 2700 units IV x 1 and increasing the rate to 1450 units/hr and rechecking @ 1100.   12/9 @ 1052 HL = 0.34. Will continue current drip rate and recheck HL tonight at 19:00.   12/9 1854 HL subtherapeutic. 1350 units IV x 1 bolus and increase rate to 1650 units/hr. Will recheck HL in 8 hours.  Laural Benes, [facility] Clinical Pharmacist 11/19/2017, 2:19 PM

## 2017-11-19 NOTE — Progress Notes (Signed)
ANTICOAGULATION CONSULT NOTE - Follow Up Consult  Pharmacy Consult for heparin gtt Indication: pulmonary embolus  No Known Allergies  Patient Measurements: Height: 5\' 9"  (175.3 cm) Weight: 204 lb 2 oz (92.6 kg)(bedscale) IBW/kg (Calculated) : 70.7 Heparin Dosing Weight: 81.6kg   Vital Signs: BP: 127/66 (12/09 0621) Pulse Rate: 64 (12/09 0621)  Labs: Recent Labs    11/18/17 0445 11/18/17 1056 11/18/17 1613 11/19/17 1052  HGB 13.6  --   --  13.2  HCT 41.3  --   --  38.8*  PLT 299  --   --  315  APTT  --  47*  --   --   LABPROT  --  14.7  --   --   INR  --  1.16  --   --   HEPARINUNFRC  --   --  0.13* 0.34  CREATININE 1.32*  --   --   --   TROPONINI <0.03  --   --   --     Estimated Creatinine Clearance: 55.2 mL/min (A) (by C-G formula based on SCr of 1.32 mg/dL (H)).   Medical History: Past Medical History:  Diagnosis Date  . CHF (congestive heart failure) ([place])   . Chronic back pain   . Dementia   . Depression   . Emphysema lung ([place])   . Hypertension   . Personal history of tobacco use, presenting hazards to health 08/06/2015  . Pollen allergies   . PTSD (post-traumatic stress disorder)   . Pulmonary embolism ([facility])     Medications:  Facility-Administered Medications Prior to Admission  Medication Dose Route Frequency Provider Last Rate Last Dose  . ciprofloxacin (CIPRO) tablet 500 mg  500 mg Oral Once Nickie Retort, MD      . lidocaine (XYLOCAINE) 2 % jelly 1 application  1 application Urethral Once Nickie Retort, MD       Medications Prior to Admission  Medication Sig Dispense Refill Last Dose  . apixaban (ELIQUIS) 5 MG TABS tablet Take 10 mg PO BID X 4 days then take 5 mg PO BID (Patient taking differently: Take 10 mg by mouth 2 (two) times daily. ) 60 tablet  N/A at N/A  . aspirin 81 MG EC tablet Take 81 mg by mouth daily. Swallow whole.   11/17/2017 at Unknown time  . busPIRone (BUSPAR) 7.5 MG tablet Take 1 tablet twice daily for anxiety.  (Patient taking differently: Take 7.5 mg by mouth daily. Take 1 tablet twice daily for anxiety.) 180 tablet 0 11/17/2017 at Unknown time  . finasteride (PROSCAR) 5 MG tablet Take 1 tablet (5 mg total) by mouth daily. 30 tablet 11 11/17/2017 at N/A  . lidocaine (LIDODERM) 5 % Place 1 patch onto the skin daily. Remove & Discard patch within 12 hours or as directed by MD 30 patch 0 11/17/2017 at AM  . metoprolol succinate (TOPROL-XL) 50 MG 24 hr tablet Take 50 mg by mouth daily. Take with or immediately following a meal.   11/17/2017 at N/A  . potassium chloride SA (K-DUR,KLOR-CON) 10 MEQ tablet Take 1 tablet (10 mEq total) by mouth daily. (Patient taking differently: Take 20 mEq by mouth daily. )   11/17/2017 at N/A  . QUEtiapine (SEROQUEL) 25 MG tablet Take 1 tablet (25 mg total) by mouth at bedtime.   11/17/2017 at N/A  . tamsulosin (FLOMAX) 0.4 MG CAPS capsule Take 0.4 mg by mouth daily after supper.    11/17/2017 at N/A  . torsemide (DEMADEX) 20  MG tablet Take 40 mg by mouth daily.   11/17/2017 at N/A  . traMADol (ULTRAM) 50 MG tablet Take 1 tablet (50 mg total) by mouth every 12 (twelve) hours as needed. 30 tablet 0 11/17/2017 at N/A  . acetaminophen (TYLENOL) 650 MG CR tablet Take 650 mg by mouth every 8 (eight) hours as needed for pain.   PRN at PRN  . albuterol (PROVENTIL HFA;VENTOLIN HFA) 108 (90 Base) MCG/ACT inhaler Inhale 2 puffs into the lungs as needed for wheezing or shortness of breath.   PRN at PRN  . albuterol (PROVENTIL) (2.5 MG/3ML) 0.083% nebulizer solution Inhale 2.5 mg into the lungs every 6 (six) hours as needed.    PRN at PRN  . digoxin (LANOXIN) 0.125 MG tablet Take 1 tablet (0.125 mg total) by mouth daily. (Patient not taking: Reported on 11/18/2017)   Not Taking at Unknown time  . diltiazem (CARDIZEM CD) 240 MG 24 hr capsule Take 1 capsule (240 mg total) by mouth daily. (Patient not taking: Reported on 11/18/2017)   Not Taking at Unknown time   Scheduled:  . busPIRone  7.5 mg Oral  Daily  . docusate sodium  100 mg Oral BID  . finasteride  5 mg Oral Daily  . metoprolol succinate  50 mg Oral Daily  . potassium chloride  20 mEq Oral Daily  . pramipexole  1 mg Oral QHS  . QUEtiapine  25 mg Oral QHS  . sodium chloride flush  3 mL Intravenous Q12H  . tamsulosin  0.4 mg Oral QPC supper  . torsemide  40 mg Oral Daily   Infusions:  . cefTRIAXone (ROCEPHIN)  IV Stopped (11/19/17 0851)  . heparin 1,450 Units/hr (11/19/17 0252)   PRN: acetaminophen **OR** acetaminophen, albuterol, cyclobenzaprine, morphine injection, ondansetron **OR** ondansetron (ZOFRAN) IV, oxyCODONE, polyethylene glycol Anti-infectives (From admission, onward)   Start     Dose/Rate Route Frequency Ordered Stop   11/18/17 1400  cefTRIAXone (ROCEPHIN) 1 g in dextrose 5 % 50 mL IVPB     1 g 100 mL/hr over 30 Minutes Intravenous Daily 11/18/17 1227        Assessment: 76 year old male requiring heparin anticoagulation for the treatment of PE.  Goal of Therapy:  Heparin level 0.3-0.7 units/ml Monitor platelets by anticoagulation protocol: Yes   Plan:  Give 4900 units bolus x 1 Start heparin infusion at 1300 units/hr Check anti-Xa level in 6 hours and daily while on heparin Continue to monitor H&H and platelets   12/8 @ 1600 HL 0.13 subtherapeutic. This level wasn't addressed. Rebolusing w/ heparin 2700 units IV x 1 and increasing the rate to 1450 units/hr and rechecking @ 1100.   12/9 @ 1052 HL = 0.34. Will continue current drip rate and recheck HL tonight at 19:00.   Olivia Canter, [facility] Clinical Pharmacist 11/19/2017, 2:19 PM

## 2017-11-19 NOTE — Consult Note (Signed)
SURGICAL CONSULTATION NOTE   HISTORY OF PRESENT ILLNESS (HPI):  76 y.o. male who was admitted due to pulmonary embolims. Unable to take history from patient due to mental status. All information was taken from chart.  There is an history of fall. Unable to assess area of trauma or pain.   Surgery is consulted by Dr. Darvin Neighbours in this context for evaluation and management of ribs fracture.  PAST MEDICAL HISTORY (PMH):  Past Medical History:  Diagnosis Date  . CHF (congestive heart failure) ([place])   . Chronic back pain   . Dementia   . Depression   . Emphysema lung ([place])   . Hypertension   . Personal history of tobacco use, presenting hazards to health 08/06/2015  . Pollen allergies   . PTSD (post-traumatic stress disorder)   . Pulmonary embolism ([place])     PAST SURGICAL HISTORY ([place]):  Past Surgical History:  Procedure Laterality Date  . BACK SURGERY    . KYPHOPLASTY N/A 06/27/2017   Procedure: KYPHOPLASTY -L5;  Surgeon: Hessie Knows, MD;  Location: [facility] ORS;  Service: Orthopedics;  Laterality: N/A;  . TONSILLECTOMY AND ADENOIDECTOMY  1980     MEDICATIONS:  Prior to Admission medications   Medication Sig Start Date End Date Taking? Authorizing Provider  apixaban (ELIQUIS) 5 MG TABS tablet Take 10 mg PO BID X 4 days then take 5 mg PO BID Patient taking differently: Take 10 mg by mouth 2 (two) times daily.  07/27/17  Yes Henreitta Leber, MD  aspirin 81 MG EC tablet Take 81 mg by mouth daily. Swallow whole.   Yes [provider]  busPIRone (BUSPAR) 7.5 MG tablet Take 1 tablet twice daily for anxiety. Patient taking differently: Take 7.5 mg by mouth daily. Take 1 tablet twice daily for anxiety. 06/19/17  Yes Pleas Koch, NP  finasteride (PROSCAR) 5 MG tablet Take 1 tablet (5 mg total) by mouth daily. 10/02/17  Yes Festus Aloe, MD  lidocaine (LIDODERM) 5 % Place 1 patch onto the skin daily. Remove & Discard patch within 12 hours or as directed by MD 07/27/17  Yes  Henreitta Leber, MD  metoprolol succinate (TOPROL-XL) 50 MG 24 hr tablet Take 50 mg by mouth daily. Take with or immediately following a meal.   Yes [provider]  potassium chloride SA (K-DUR,KLOR-CON) 10 MEQ tablet Take 1 tablet (10 mEq total) by mouth daily. Patient taking differently: Take 20 mEq by mouth daily.  07/28/17  Yes Henreitta Leber, MD  QUEtiapine (SEROQUEL) 25 MG tablet Take 1 tablet (25 mg total) by mouth at bedtime. 07/27/17  Yes Henreitta Leber, MD  tamsulosin (FLOMAX) 0.4 MG CAPS capsule Take 0.4 mg by mouth daily after supper.    Yes [provider]  torsemide (DEMADEX) 20 MG tablet Take 40 mg by mouth daily.   Yes [provider]  traMADol (ULTRAM) 50 MG tablet Take 1 tablet (50 mg total) by mouth every 12 (twelve) hours as needed. 07/27/17  Yes Henreitta Leber, MD  acetaminophen (TYLENOL) 650 MG CR tablet Take 650 mg by mouth every 8 (eight) hours as needed for pain.    [provider]  albuterol (PROVENTIL HFA;VENTOLIN HFA) 108 (90 Base) MCG/ACT inhaler Inhale 2 puffs into the lungs as needed for wheezing or shortness of breath.    [provider]  albuterol (PROVENTIL) (2.5 MG/3ML) 0.083% nebulizer solution Inhale 2.5 mg into the lungs every 6 (six) hours as needed.  01/15/15  [provider]  digoxin (LANOXIN) 0.125 MG tablet Take 1 tablet (0.125 mg total) by mouth daily. Patient not taking: Reported on 11/18/2017 06/30/17   Dustin Flock, MD  diltiazem (CARDIZEM CD) 240 MG 24 hr capsule Take 1 capsule (240 mg total) by mouth daily. Patient not taking: Reported on 11/18/2017 06/30/17   Dustin Flock, MD    ALLERGIES:  No Known Allergies   SOCIAL HISTORY:  Social History   Socioeconomic History  . Marital status: Widowed    Spouse name: Not on file  . Number of children: Not on file  . Years of education: Not on file  . Highest education level: Not on file  Social Needs  . Financial resource strain: Not on  file  . Food insecurity - worry: Not on file  . Food insecurity - inability: Not on file  . Transportation needs - medical: Not on file  . Transportation needs - non-medical: Not on file  Occupational History  . Not on file  Tobacco Use  . Smoking status: Current Every Day Smoker    Packs/day: 1.00    Years: 60.00    Pack years: 60.00    Types: Cigarettes  . Smokeless tobacco: Never Used  Substance and Sexual Activity  . Alcohol use: No    Alcohol/week: 0.0 oz  . Drug use: No  . Sexual activity: Not Currently  Other Topics Concern  . Not on file  Social History Narrative   Widower.   2 children, 3 grandchildren, 1 great grandchild.   Retired. Now works as a Gaffer.   Enjoys relaxing, spending time with family, watching basketball.     FAMILY HISTORY:  Family History  Problem Relation Age of Onset  . Alcohol abuse Father   . Arthritis Mother   . Heart disease Maternal Grandmother   . Prostate cancer Brother      REVIEW OF SYSTEMS:  Unable to evaluate due to mental status.   VITAL SIGNS:  Temp:  [98.1 F (36.7 C)-98.6 F (37 C)] 98.1 F (36.7 C) (12/08 2151) Pulse Rate:  [64-86] 64 (12/09 0621) Resp:  [20-22] 20 (12/08 2151) BP: (113-157)/(65-81) 127/66 (12/09 0621) SpO2:  [93 %-97 %] 97 % (12/09 0621) Weight:  [92.6 kg (204 lb 2 oz)-93.3 kg (205 lb 11.2 oz)] 92.6 kg (204 lb 2 oz) (12/09 0254)     Height: 5\' 9"  (175.3 cm) Weight: 92.6 kg (204 lb 2 oz)(bedscale) BMI (Calculated): 30.13    PHYSICAL EXAM:  Constitutional:  -- Normal body habitus  -- Sleeping, not oriented in time and place. Responded to name properly  Eyes:  -- Pupils equally round and reactive to light  -- No scleral icterus  Ear, nose, and throat:  -- No jugular venous distension  Pulmonary:  -- No crackles  -- Equal breath sounds bilaterally -- Breathing non-labored at rest Cardiovascular:  -- Regular rate Gastrointestinal:  -- Abdomen soft, nontender, non-distended, no guarding or  rebound tenderness -- No abdominal masses appreciated, pulsatile or otherwise  Musculoskeletal and Integumentary:  -- Extremities: no cyanosis Neurologic:  -- Motor function: intact and symmetric -- Sensation: unable to assess   Labs:  CBC Latest Ref Rng & Units 11/18/2017 10/01/2017 07/28/2017  WBC 3.8 - 10.6 K/uL 15.2(H) 14.2(H) 13.6(H)  Hemoglobin 13.0 - 18.0 g/dL 13.6 13.2 13.4  Hematocrit 40.0 - 52.0 % 41.3 39.9(L) 40.2  Platelets 150 - 440 K/uL 299 285 277   CMP Latest Ref Rng & Units 11/18/2017 10/01/2017 07/28/2017  Glucose  65 - 99 mg/dL 118(H) 102(H) 118(H)  BUN 6 - 20 mg/dL 21(H) 14 17  Creatinine 0.61 - 1.24 mg/dL 1.32(H) 1.43(H) 1.06  Sodium 135 - 145 mmol/L 135 138 140  Potassium 3.5 - 5.1 mmol/L 3.7 3.4(L) 3.2(L)  Chloride 101 - 111 mmol/L 96(L) 104 106  CO2 22 - 32 mmol/L 28 29 28   Calcium 8.9 - 10.3 mg/dL 9.3 9.0 8.1(L)  Total Protein 6.5 - 8.1 g/dL 7.0 - -  Total Bilirubin 0.3 - 1.2 mg/dL 0.3 - -  Alkaline Phos 38 - 126 U/L 80 - -  AST 15 - 41 U/L 22 - -  ALT 17 - 63 U/L 7(L) - -   Imaging studies:  Chest CT scan: multiple non displaced left chest ribs fracture. No pneumo or hemo thorax. Positive for pulmonary embolism  Assessment/Plan:  76 y.o. male with history of CHF, Pulmonary embolism on Eliquis, COPD moderate, Afib that has an history of recent fall. Patient was diagnosed with new PE and due to the fall left sided ribs fracture. Regarding the ribs fracture, current orders of pain medication are adequate pain management and the treatment for ribs fracture. Upon review of the CT scan, the fracture are non displaced, so no surgical management is indicated. When I got into the room, the patient was resting without any distress. When patient woke up was confused and unable to give history but no sign of hypoxia or difficulty breathing. Even though, patient most likely in pain that should be controlled with current pain medications. I recommend to continue current  management. No contraindication for anticoagulation therapy for PE.

## 2017-11-19 NOTE — Progress Notes (Signed)
*  PRELIMINARY RESULTS* Echocardiogram 2D Echocardiogram has been performed.  Sherrie Sport 11/19/2017, 9:07 AM

## 2017-11-19 NOTE — Progress Notes (Signed)
Pt has c/o of pain multiple times throughout the night, medicated each time with some relief. Pt. Continues to be incontinent, have a non-productive cough and guards left side. Pt. Has catnapped through out the night. O2 continues as well as heparin gtt which is running at 14.5 ml/hr.

## 2017-11-19 NOTE — Progress Notes (Signed)
ANTICOAGULATION CONSULT NOTE - Initial Consult  Pharmacy Consult for heparin gtt Indication: pulmonary embolus  No Known Allergies  Patient Measurements: Height: 5\' 9"  (175.3 cm) Weight: 205 lb 11.2 oz (93.3 kg) IBW/kg (Calculated) : 70.7 Heparin Dosing Weight: 81.6kg   Vital Signs: Temp: 98.1 F (36.7 C) (12/08 2151) Temp Source: Oral (12/08 2151) BP: 113/65 (12/08 2151) Pulse Rate: 86 (12/08 2151)  Labs: Recent Labs    11/18/17 0445 11/18/17 1056 11/18/17 1613  HGB 13.6  --   --   HCT 41.3  --   --   PLT 299  --   --   APTT  --  47*  --   LABPROT  --  14.7  --   INR  --  1.16  --   HEPARINUNFRC  --   --  0.13*  CREATININE 1.32*  --   --   TROPONINI <0.03  --   --     Estimated Creatinine Clearance: 55.3 mL/min (A) (by C-G formula based on SCr of 1.32 mg/dL (H)).   Medical History: Past Medical History:  Diagnosis Date  . CHF (congestive heart failure) ([place])   . Chronic back pain   . Dementia   . Depression   . Emphysema lung ([place])   . Hypertension   . Personal history of tobacco use, presenting hazards to health 08/06/2015  . Pollen allergies   . PTSD (post-traumatic stress disorder)   . Pulmonary embolism ([facility])     Medications:  Facility-Administered Medications Prior to Admission  Medication Dose Route Frequency Provider Last Rate Last Dose  . ciprofloxacin (CIPRO) tablet 500 mg  500 mg Oral Once Nickie Retort, MD      . lidocaine (XYLOCAINE) 2 % jelly 1 application  1 application Urethral Once Nickie Retort, MD       Medications Prior to Admission  Medication Sig Dispense Refill Last Dose  . apixaban (ELIQUIS) 5 MG TABS tablet Take 10 mg PO BID X 4 days then take 5 mg PO BID (Patient taking differently: Take 10 mg by mouth 2 (two) times daily. ) 60 tablet  N/A at N/A  . aspirin 81 MG EC tablet Take 81 mg by mouth daily. Swallow whole.   11/17/2017 at Unknown time  . busPIRone (BUSPAR) 7.5 MG tablet Take 1 tablet twice daily for anxiety.  (Patient taking differently: Take 7.5 mg by mouth daily. Take 1 tablet twice daily for anxiety.) 180 tablet 0 11/17/2017 at Unknown time  . finasteride (PROSCAR) 5 MG tablet Take 1 tablet (5 mg total) by mouth daily. 30 tablet 11 11/17/2017 at N/A  . lidocaine (LIDODERM) 5 % Place 1 patch onto the skin daily. Remove & Discard patch within 12 hours or as directed by MD 30 patch 0 11/17/2017 at AM  . metoprolol succinate (TOPROL-XL) 50 MG 24 hr tablet Take 50 mg by mouth daily. Take with or immediately following a meal.   11/17/2017 at N/A  . potassium chloride SA (K-DUR,KLOR-CON) 10 MEQ tablet Take 1 tablet (10 mEq total) by mouth daily. (Patient taking differently: Take 20 mEq by mouth daily. )   11/17/2017 at N/A  . QUEtiapine (SEROQUEL) 25 MG tablet Take 1 tablet (25 mg total) by mouth at bedtime.   11/17/2017 at N/A  . tamsulosin (FLOMAX) 0.4 MG CAPS capsule Take 0.4 mg by mouth daily after supper.    11/17/2017 at N/A  . torsemide (DEMADEX) 20 MG tablet Take 40 mg by mouth daily.  11/17/2017 at N/A  . traMADol (ULTRAM) 50 MG tablet Take 1 tablet (50 mg total) by mouth every 12 (twelve) hours as needed. 30 tablet 0 11/17/2017 at N/A  . acetaminophen (TYLENOL) 650 MG CR tablet Take 650 mg by mouth every 8 (eight) hours as needed for pain.   PRN at PRN  . albuterol (PROVENTIL HFA;VENTOLIN HFA) 108 (90 Base) MCG/ACT inhaler Inhale 2 puffs into the lungs as needed for wheezing or shortness of breath.   PRN at PRN  . albuterol (PROVENTIL) (2.5 MG/3ML) 0.083% nebulizer solution Inhale 2.5 mg into the lungs every 6 (six) hours as needed.    PRN at PRN  . digoxin (LANOXIN) 0.125 MG tablet Take 1 tablet (0.125 mg total) by mouth daily. (Patient not taking: Reported on 11/18/2017)   Not Taking at Unknown time  . diltiazem (CARDIZEM CD) 240 MG 24 hr capsule Take 1 capsule (240 mg total) by mouth daily. (Patient not taking: Reported on 11/18/2017)   Not Taking at Unknown time   Scheduled:  . busPIRone  7.5 mg Oral  Daily  . docusate sodium  100 mg Oral BID  . finasteride  5 mg Oral Daily  . heparin  2,700 Units Intravenous Once  . metoprolol succinate  50 mg Oral Daily  . potassium chloride  20 mEq Oral Daily  . pramipexole  1 mg Oral QHS  . QUEtiapine  25 mg Oral QHS  . sodium chloride flush  3 mL Intravenous Q12H  . tamsulosin  0.4 mg Oral QPC supper  . torsemide  40 mg Oral Daily   Infusions:  . cefTRIAXone (ROCEPHIN)  IV Stopped (11/18/17 1435)  . heparin 1,300 Units/hr (11/18/17 1405)   PRN: acetaminophen **OR** acetaminophen, albuterol, morphine injection, ondansetron **OR** ondansetron (ZOFRAN) IV, oxyCODONE, polyethylene glycol Anti-infectives (From admission, onward)   Start     Dose/Rate Route Frequency Ordered Stop   11/18/17 1400  cefTRIAXone (ROCEPHIN) 1 g in dextrose 5 % 50 mL IVPB     1 g 100 mL/hr over 30 Minutes Intravenous Daily 11/18/17 1227        Assessment: 76 year old male requiring heparin anticoagulation for the treatment of PE.  Goal of Therapy:  Heparin level 0.3-0.7 units/ml Monitor platelets by anticoagulation protocol: Yes   Plan:  Give 4900 units bolus x 1 Start heparin infusion at 1300 units/hr Check anti-Xa level in 6 hours and daily while on heparin Continue to monitor H&H and platelets   12/8 @ 1600 HL 0.13 subtherapeutic. This level wasn't addressed. Rebolusing w/ heparin 2700 units IV x 1 and increasing the rate to 1450 units/hr and rechecking @ 1100.  Tobie Lords, PharmD, BCPS Clinical Pharmacist 11/19/2017

## 2017-11-19 NOTE — Progress Notes (Signed)
[facility] [place] NAME: Bliss Tsang    MR#:  [id number]  DATE OF BIRTH:  April 08, 1941  SUBJECTIVE:  CHIEF COMPLAINT:   Chief Complaint  Patient presents with  . Weakness  . Fall   Severe pain left chest Some SOB  REVIEW OF SYSTEMS:    Review of Systems  Unable to perform ROS: Mental status change    DRUG ALLERGIES:  No Known Allergies  VITALS:  Blood pressure 127/66, pulse 64, temperature 98.1 F (36.7 C), temperature source Oral, resp. rate 20, height 5\' 9"  (1.753 m), weight 92.6 kg (204 lb 2 oz), SpO2 97 %.  PHYSICAL EXAMINATION:   Physical Exam  GENERAL:  76 y.o.-year-old patient lying in the bed with no acute distress.  EYES: Pupils equal, round, reactive to light and accommodation. No scleral icterus. Extraocular muscles intact.  HEENT: Head atraumatic, normocephalic. Oropharynx and nasopharynx clear.  NECK:  Supple, no jugular venous distention. No thyroid enlargement, no tenderness.  LUNGS: Normal breath sounds bilaterally, no wheezing, rales, rhonchi. No use of accessory muscles of respiration.  CARDIOVASCULAR: S1, S2 normal. No murmurs, rubs, or gallops.  ABDOMEN: Soft, nontender, nondistended. Bowel sounds present. No organomegaly or mass.  EXTREMITIES: No cyanosis, clubbing or edema b/l.    NEUROLOGIC: Cranial nerves II through XII are intact. No focal Motor or sensory deficits b/l.   PSYCHIATRIC: The patient is awake and confused SKIN: No obvious rash, lesion, or ulcer.   LABORATORY PANEL:   CBC Recent Labs  Lab 11/18/17 0445  WBC 15.2*  HGB 13.6  HCT 41.3  PLT 299   ------------------------------------------------------------------------------------------------------------------ Chemistries  Recent Labs  Lab 11/18/17 0445  NA 135  K 3.7  CL 96*  CO2 28  GLUCOSE 118*  BUN 21*  CREATININE 1.32*  CALCIUM 9.3  AST 22  ALT 7*  ALKPHOS 80  BILITOT 0.3    ------------------------------------------------------------------------------------------------------------------  Cardiac Enzymes Recent Labs  Lab 11/18/17 0445  TROPONINI <0.03   ------------------------------------------------------------------------------------------------------------------  RADIOLOGY:  Dg Chest 2 View  Result Date: 11/18/2017 CLINICAL DATA:  76 year old male with shortness of breath. EXAM: CHEST  2 VIEW COMPARISON:  Chest radiograph dated 07/28/2017 FINDINGS: There is shallow inspiration with bibasilar atelectatic changes/ scarring. There is diffuse interstitial coarsening. No focal consolidation, pleural effusion, or pneumothorax. The cardiac silhouette is within normal limits. There is atherosclerotic calcification of the the aortic arch. Osteopenia with degenerative changes of the spine. No acute osseous pathology. IMPRESSION: No active cardiopulmonary disease. Electronically Signed   By: Anner Crete M.D.   On: 11/18/2017 06:22   Ct Angio Chest Pe W/cm &/or Wo Cm  Result Date: 11/18/2017 CLINICAL DATA:  Per EMS: patient has fallen twice in the last two days. Patient on floor at EMS arrival. Patient c/o increased back pain since fall. Patient reports hx of back surgery. Patient c/o increasing weakness in the last few days. Hx: Emphysema, CHF. EXAM: CT ANGIOGRAPHY CHEST WITH CONTRAST TECHNIQUE: Multidetector CT imaging of the chest was performed using the standard protocol during bolus administration of intravenous contrast. Multiplanar CT image reconstructions and MIPs were obtained to evaluate the vascular anatomy. CONTRAST:  34mL ISOVUE-370 IOPAMIDOL (ISOVUE-370) INJECTION 76% COMPARISON:  Current chest radiograph.  Chest CTA, 07/22/2017 FINDINGS: Cardiovascular: There is satisfactory opacification of the central pulmonary arteries. Some mixed enhancement of the more peripheral pulmonary arteries, along with respiratory motion, limits assessment lower lung  segmental pulmonary arteries. Allowing for this, there are not definitive pulmonary emboli. A  nonocclusive pulmonary embolus is noted in the anterior segmental branch to the left upper lobe. Apparent pulmonary embolus is noted in the anterior signal branch to the right upper lobe. Pulmonary embolus is noted at the bifurcation of the right middle lobe pulmonary arteries. There are probable pulmonary emboli in the both lower lobe segmental pulmonary arteries. No evidence of a main pulmonary artery embolus. RV/LV ratio:  1.26 consistent with right heart strain. No pericardial effusion. Heart is borderline enlarged. There are stable three-vessel coronary artery calcifications. Mild aortic atherosclerosis. No aortic dissection or aneurysm. Mediastinum/Nodes: No enlarged mediastinal, hilar, or axillary lymph nodes. Thyroid gland, trachea, and esophagus demonstrate no significant findings. Lungs/Pleura: Trace left pleural effusion. Patchy and linear type opacities are noted in the lower lobes and left upper lobe lingula consistent with atelectasis. No convincing pneumonia. No pulmonary edema. Moderate changes of centrilobular emphysema. No pneumothorax. Upper Abdomen: No acute findings. Musculoskeletal: There are multiple left lateral rib fractures, without significant displacement. There are fractures of the left lateral fifth through tenth ribs. No other acute fractures. Minor endplate depressions along the midthoracic spine stable from the prior CT angiogram. No osteoblastic or osteolytic lesions. Review of the MIP images confirms the above findings. IMPRESSION: 1. There are several segmental pulmonary emboli in both lungs, which are presumably acute. The pulmonary embolus noted in the left upper lobe apical segmental branch is clearly new from the prior CT angiogram dated 07/22/2017. 2. Positive for acute PE with CT evidence of right heart strain (RV/LV Ratio = 1.26) consistent with at least submassive (intermediate  risk) PE. The presence of right heart strain has been associated with an increased risk of morbidity and mortality. Please activate Code PE by paging [phone]. 3. Acute fractures of the left lateral fifth through tenth ribs. No lung contusion or laceration. No pneumothorax. Trace left pleural effusion. 4. Lung base atelectasis, but no evidence of pneumonia or pulmonary edema. 5. Emphysema and aortic atherosclerosis. Coronary artery calcifications. Aortic Atherosclerosis (ICD10-I70.0) and Emphysema (ICD10-J43.9). Electronically Signed   By: Lajean Manes M.D.   On: 11/18/2017 08:01   US Venous Img Lower Bilateral  Result Date: 11/18/2017 CLINICAL DATA:  Pulmonary emboli EXAM: BILATERAL LOWER EXTREMITY VENOUS DOPPLER ULTRASOUND TECHNIQUE: Gray-scale sonography with compression, as well as color and duplex ultrasound, were performed to evaluate the deep venous system from the level of the common femoral vein through the popliteal and proximal calf veins. COMPARISON:  None FINDINGS: On the left, Normal compressibility of the common femoral, superficial femoral, and popliteal veins, as well as the proximal calf veins. No filling defects to suggest DVT on grayscale or color Doppler imaging. Doppler waveforms show normal direction of venous flow, normal respiratory phasicity and response to augmentation. Visualized segments of the saphenous venous system normal in caliber and compressibility. On the right, the common femoral, deep femoral, femoral, and popliteal veins are unremarkable. The peroneal vein is noncompressible, with no color Doppler signal to indicate flow. IMPRESSION: 1. Isolated Right peroneal occlusive DVT. 2. Negative for left lower extremity DVT. No evidence of  lower extremity deep vein thrombosis. Electronically Signed   By: Lucrezia Europe M.D.   On: 11/18/2017 10:25     ASSESSMENT AND PLAN:   * Bilateral pulmonary emboli while being on Eliquis.  Will start heparin drip.   Likely not taking  eliquis regularly Appreciate oncology input Venous dopplers showed right peroneal DVT Echocardiogram pending Eliquis at discharge for life  * Acute hypoxic resp failure POA Wean O2  *UTI.  Started IV ceftriaxone.  Culture sent and pending.  *Left 5 to10 rib fractures No pneumothorax.  Symptomatic treatment.  Incentive spirometer.  Pain medications added. Increase morphine and oxycodone dose. Added flexeril Will consult surgery  *COPD.  No wheezing.  Nebulizers as needed.  * Acute inpatient delirium over mild dementia  All the records are reviewed and case discussed with Care Management/Social Worker Management plans discussed with the patient, family and they are in agreement.  CODE STATUS: DNR  DVT Prophylaxis: SCDs  TOTAL TIME TAKING CARE OF THIS PATIENT: 35 minutes.   POSSIBLE D/C IN 1-2 DAYS, DEPENDING ON CLINICAL CONDITION.  Leia Alf Kail Fraley M.D on 11/19/2017 at 9:11 AM  Between 7am to 6pm - Pager - [phone]  After 6pm go to [email or web address] - password [email or web address] [facility]  Office  [phone]  CC: Primary care physician; Rusty Aus, MD  Note: This dictation was prepared with Dragon dictation along with smaller phrase technology. Any transcriptional errors that result from this process are unintentional.

## 2017-11-19 NOTE — Progress Notes (Signed)
Pt. Removed oxygen, IV from right wrist and took off tele box along with gown sitting on side of bed. Pt. Redirected, tele re-apply along with gown. Bed alarm set on more sensitive setting. Will continue to monitor pt.

## 2017-11-19 NOTE — Plan of Care (Signed)
Pt remains confused and calls out with pain upon minimal movement. Turns self back to right in slanting position for comfort. Flexeril started with pt napping at intervals. Continued pain control with oxycodone currently. WBC's trending upward with MD made aware. Urine culture positive and awaiting sensitivities. Appetite has decreased; encouraging po's with dietitian referral submitted.  Pt needs assistance with feeding with movement causing pain. Awaiting surgical consult for management of fractured rib pain. Decreased/coarse sound left lateral chest with MD aware. VSS.

## 2017-11-20 LAB — BASIC METABOLIC PANEL
Anion gap: 9 (ref 5–15)
BUN: 18 mg/dL (ref 6–20)
CALCIUM: 9.1 mg/dL (ref 8.9–10.3)
CHLORIDE: 102 mmol/L (ref 101–111)
CO2: 27 mmol/L (ref 22–32)
CREATININE: 1.19 mg/dL (ref 0.61–1.24)
GFR, EST NON AFRICAN AMERICAN: 58 mL/min — AB (ref >60)
Glucose, Bld: 120 mg/dL — ABNORMAL HIGH (ref 65–99)
Potassium: 3.5 mmol/L (ref 3.5–5.1)
SODIUM: 138 mmol/L (ref 135–145)

## 2017-11-20 LAB — CBC WITH DIFFERENTIAL/PLATELET
BASOS ABS: 0.2 10*3/uL — AB (ref 0–0.1)
BASOS PCT: 1 %
EOS ABS: 0.2 10*3/uL (ref 0–0.7)
EOS PCT: 1 %
HCT: 39.7 % — ABNORMAL LOW (ref 40.0–52.0)
HEMOGLOBIN: 13.5 g/dL (ref 13.0–18.0)
LYMPHS ABS: 2.4 10*3/uL (ref 1.0–3.6)
Lymphocytes Relative: 16 %
MCH: 30.8 pg (ref 26.0–34.0)
MCHC: 33.9 g/dL (ref 32.0–36.0)
MCV: 90.9 fL (ref 80.0–100.0)
Monocytes Absolute: 1.5 10*3/uL — ABNORMAL HIGH (ref 0.2–1.0)
Monocytes Relative: 10 %
NEUTROS PCT: 72 %
Neutro Abs: 10.6 10*3/uL — ABNORMAL HIGH (ref 1.4–6.5)
PLATELETS: 307 10*3/uL (ref 150–440)
RBC: 4.37 MIL/uL — AB (ref 4.40–5.90)
RDW: 14.5 % (ref 11.5–14.5)
WBC: 14.9 10*3/uL — AB (ref 3.8–10.6)

## 2017-11-20 LAB — URINE CULTURE: Culture: >=100000 — AB

## 2017-11-20 LAB — HEPARIN LEVEL (UNFRACTIONATED): HEPARIN UNFRACTIONATED: 0.34 [IU]/mL (ref 0.30–0.70)

## 2017-11-20 MED ORDER — ENSURE ENLIVE PO LIQD
237.0000 mL | Freq: Three times a day (TID) | ORAL | Status: DC
  Administered 2017-11-20 – 2017-11-22 (×6): 237 mL via ORAL

## 2017-11-20 MED ORDER — APIXABAN 5 MG PO TABS
10.0000 mg | ORAL_TABLET | Freq: Two times a day (BID) | ORAL | Status: DC
  Administered 2017-11-20 – 2017-11-22 (×5): 10 mg via ORAL
  Filled 2017-11-20 (×5): qty 2

## 2017-11-20 MED ORDER — LIDOCAINE 5 % EX PTCH
1.0000 | MEDICATED_PATCH | CUTANEOUS | Status: DC
  Administered 2017-11-20 – 2017-11-21 (×2): 1 via TRANSDERMAL
  Filled 2017-11-20 (×3): qty 1

## 2017-11-20 MED ORDER — CYCLOBENZAPRINE HCL 10 MG PO TABS
5.0000 mg | ORAL_TABLET | Freq: Three times a day (TID) | ORAL | Status: DC
  Administered 2017-11-20 – 2017-11-22 (×7): 5 mg via ORAL
  Filled 2017-11-20 (×7): qty 1

## 2017-11-20 MED ORDER — ALPRAZOLAM 0.25 MG PO TABS
0.1250 mg | ORAL_TABLET | Freq: Two times a day (BID) | ORAL | Status: DC | PRN
  Administered 2017-11-20 – 2017-11-22 (×3): 0.125 mg via ORAL
  Filled 2017-11-20 (×3): qty 1

## 2017-11-20 MED ORDER — ADULT MULTIVITAMIN W/MINERALS CH
1.0000 | ORAL_TABLET | Freq: Every day | ORAL | Status: DC
  Administered 2017-11-21 – 2017-11-22 (×2): 1 via ORAL
  Filled 2017-11-20 (×2): qty 1

## 2017-11-20 MED ORDER — APIXABAN 5 MG PO TABS: 5.0000 mg | ORAL_TABLET | Freq: Two times a day (BID) | ORAL | Status: DC

## 2017-11-20 NOTE — Progress Notes (Signed)
[facility] NAME: Ricardo Erickson    MR#:  [id number]  DATE OF BIRTH:  1941-12-12  SUBJECTIVE:  CHIEF COMPLAINT:   Chief Complaint  Patient presents with  . Weakness  . Fall   - came in after fall and left sided rib fractures - significant pain on slightest movement  REVIEW OF SYSTEMS:  Review of Systems  Constitutional: Negative for chills, fever and malaise/fatigue.  HENT: Negative for ear discharge, hearing loss and nosebleeds.   Eyes: Negative for blurred vision.  Respiratory: Positive for shortness of breath. Negative for cough and wheezing.   Cardiovascular: Positive for chest pain. Negative for palpitations.  Gastrointestinal: Negative for abdominal pain, constipation, diarrhea, nausea and vomiting.  Genitourinary: Negative for dysuria.  Musculoskeletal: Positive for myalgias.  Neurological: Negative for dizziness, speech change, focal weakness, seizures and headaches.  Psychiatric/Behavioral: Negative for depression.    DRUG ALLERGIES:  No Known Allergies  VITALS:  Blood pressure 128/72, pulse 86, temperature 98.6 F (37 C), temperature source Oral, resp. rate 19, height 5\' 9"  (1.753 m), weight 92.6 kg (204 lb 2 oz), SpO2 92 %.  PHYSICAL EXAMINATION:  Physical Exam  GENERAL:  76 y.o.-year-old elderly patient lying in the bed with no acute distress.  EYES: Pupils equal, round, reactive to light and accommodation. No scleral icterus. Extraocular muscles intact.  HEENT: Head atraumatic, normocephalic. Oropharynx and nasopharynx clear.  NECK:  Supple, no jugular venous distention. No thyroid enlargement, no tenderness.  LUNGS: Normal breath sounds bilaterally, no wheezing, rales,rhonchi or crepitation. No use of accessory muscles of respiration. Decreased basilar breath sounds CARDIOVASCULAR: S1, S2 normal. No rubs, or gallops. 3/6 systolic murmur present ABDOMEN: Soft, nontender, nondistended. Bowel sounds present.  No organomegaly or mass.  EXTREMITIES: No pedal edema, cyanosis, or clubbing.  NEUROLOGIC: Cranial nerves II through XII are intact. Muscle strength 5/5 in all extremities. Sensation intact. Gait not checked.  PSYCHIATRIC: The patient is alert and oriented to self.  SKIN: No obvious rash, lesion, or ulcer.    LABORATORY PANEL:   CBC Recent Labs  Lab 11/20/17 0547  WBC 14.9*  HGB 13.5  HCT 39.7*  PLT 307   ------------------------------------------------------------------------------------------------------------------  Chemistries  Recent Labs  Lab 11/18/17 0445 11/20/17 0547  NA 135 138  K 3.7 3.5  CL 96* 102  CO2 28 27  GLUCOSE 118* 120*  BUN 21* 18  CREATININE 1.32* 1.19  CALCIUM 9.3 9.1  AST 22  --   ALT 7*  --   ALKPHOS 80  --   BILITOT 0.3  --    ------------------------------------------------------------------------------------------------------------------  Cardiac Enzymes Recent Labs  Lab 11/18/17 0445  TROPONINI <0.03   ------------------------------------------------------------------------------------------------------------------  RADIOLOGY:  No results found.  EKG:   Orders placed or performed during the hospital encounter of 11/18/17  . ED EKG  . ED EKG  . EKG 12-Lead  . EKG 12-Lead    ASSESSMENT AND PLAN:   76 year old male with past medical history significant for congestive heart failure, early dementia, depression, COPD not on home oxygen, history of PE after back surgery presents to hospital secondary to a fall and noted to have bilateral pulmonary emboli.  1. Multiple bilateral pulmonary emboli-noted on CT chest. Patient was treated with eliquis for PE after his back surgery for 6 months and he was just recently stopped about 3 weeks ago, according to son. -CT with still bilateral emboli and Doppler's with right peroneal DVT -Echocardiogram with no right heart  strain. -Received IV heparin, change to oral eliquis and patient may  need lifelong anticoagulation at this point. -Discussed the risks with son at bedside  2. Multiple liver rib fractures-left-sided rib fracture secondary to fall. -Complains of significant pain. Added Lidoderm patch, scheduled Flexeril and still use narcotic when necessary pain medications for now. - if no improvement- consider nerve block?  3. Acute delirium-secondary to being in the hospital, on top of dementia -Worsened with pain medications as well. Continue to monitor closely.  4. UTI-recent BPH and two-month Foley catheter which was recently removed. Likely could have been the source. -Follow up urine cultures. Continue Rocephin  5. DVT prophylaxis-already on eliquis  Physical therapy consult today. Likely will need rehabilitation at discharge   All the records are reviewed and case discussed with Care Management/Social Workerr. Management plans discussed with the patient, family and they are in agreement.  CODE STATUS: DNR  TOTAL TIME TAKING CARE OF THIS PATIENT: 38 minutes.   POSSIBLE D/C IN 2-3 DAYS, DEPENDING ON CLINICAL CONDITION.   Vail Basista M.D on 11/20/2017 at 2:02 PM  Between 7am to 6pm - Pager - [phone]  After 6pm go to [email or web address] - password [email or web address] [facility]  Office  [phone]  CC: Primary care physician; Rusty Aus, MD

## 2017-11-20 NOTE — Progress Notes (Signed)
ANTICOAGULATION CONSULT NOTE - Follow Up Consult  Pharmacy Consult for heparin gtt Indication: pulmonary embolus  No Known Allergies  Patient Measurements: Height: 5\' 9"  (175.3 cm) Weight: 204 lb 2 oz (92.6 kg)(bedscale) IBW/kg (Calculated) : 70.7 Heparin Dosing Weight: 81.6kg   Vital Signs: Temp: 99.1 F (37.3 C) (12/09 1957) Temp Source: Oral (12/09 1957) BP: 110/62 (12/09 1957) Pulse Rate: 83 (12/09 1957)  Labs: Recent Labs    11/18/17 0445 11/18/17 1056  11/19/17 1052 11/19/17 1854 11/20/17 0547  HGB 13.6  --   --  13.2  --  13.5  HCT 41.3  --   --  38.8*  --  39.7*  PLT 299  --   --  315  --  307  APTT  --  47*  --   --   --   --   LABPROT  --  14.7  --   --   --   --   INR  --  1.16  --   --   --   --   HEPARINUNFRC  --   --    < > 0.34 0.29* 0.34  CREATININE 1.32*  --   --   --   --  1.19  TROPONINI <0.03  --   --   --   --   --    < > = values in this interval not displayed.    Estimated Creatinine Clearance: 61.2 mL/min (by C-G formula based on SCr of 1.19 mg/dL).   Medical History: Past Medical History:  Diagnosis Date  . CHF (congestive heart failure) ([place])   . Chronic back pain   . Dementia   . Depression   . Emphysema lung ([place])   . Hypertension   . Personal history of tobacco use, presenting hazards to health 08/06/2015  . Pollen allergies   . PTSD (post-traumatic stress disorder)   . Pulmonary embolism ([facility])     Medications:  Facility-Administered Medications Prior to Admission  Medication Dose Route Frequency Provider Last Rate Last Dose  . ciprofloxacin (CIPRO) tablet 500 mg  500 mg Oral Once Nickie Retort, MD      . lidocaine (XYLOCAINE) 2 % jelly 1 application  1 application Urethral Once Nickie Retort, MD       Medications Prior to Admission  Medication Sig Dispense Refill Last Dose  . apixaban (ELIQUIS) 5 MG TABS tablet Take 10 mg PO BID X 4 days then take 5 mg PO BID (Patient taking differently: Take 10 mg by mouth 2  (two) times daily. ) 60 tablet  N/A at N/A  . aspirin 81 MG EC tablet Take 81 mg by mouth daily. Swallow whole.   11/17/2017 at Unknown time  . busPIRone (BUSPAR) 7.5 MG tablet Take 1 tablet twice daily for anxiety. (Patient taking differently: Take 7.5 mg by mouth daily. Take 1 tablet twice daily for anxiety.) 180 tablet 0 11/17/2017 at Unknown time  . finasteride (PROSCAR) 5 MG tablet Take 1 tablet (5 mg total) by mouth daily. 30 tablet 11 11/17/2017 at N/A  . lidocaine (LIDODERM) 5 % Place 1 patch onto the skin daily. Remove & Discard patch within 12 hours or as directed by MD 30 patch 0 11/17/2017 at AM  . metoprolol succinate (TOPROL-XL) 50 MG 24 hr tablet Take 50 mg by mouth daily. Take with or immediately following a meal.   11/17/2017 at N/A  . potassium chloride SA (K-DUR,KLOR-CON) 10 MEQ tablet Take 1 tablet (10  mEq total) by mouth daily. (Patient taking differently: Take 20 mEq by mouth daily. )   11/17/2017 at N/A  . QUEtiapine (SEROQUEL) 25 MG tablet Take 1 tablet (25 mg total) by mouth at bedtime.   11/17/2017 at N/A  . tamsulosin (FLOMAX) 0.4 MG CAPS capsule Take 0.4 mg by mouth daily after supper.    11/17/2017 at N/A  . torsemide (DEMADEX) 20 MG tablet Take 40 mg by mouth daily.   11/17/2017 at N/A  . traMADol (ULTRAM) 50 MG tablet Take 1 tablet (50 mg total) by mouth every 12 (twelve) hours as needed. 30 tablet 0 11/17/2017 at N/A  . acetaminophen (TYLENOL) 650 MG CR tablet Take 650 mg by mouth every 8 (eight) hours as needed for pain.   PRN at PRN  . albuterol (PROVENTIL HFA;VENTOLIN HFA) 108 (90 Base) MCG/ACT inhaler Inhale 2 puffs into the lungs as needed for wheezing or shortness of breath.   PRN at PRN  . albuterol (PROVENTIL) (2.5 MG/3ML) 0.083% nebulizer solution Inhale 2.5 mg into the lungs every 6 (six) hours as needed.    PRN at PRN  . digoxin (LANOXIN) 0.125 MG tablet Take 1 tablet (0.125 mg total) by mouth daily. (Patient not taking: Reported on 11/18/2017)   Not Taking at Unknown  time  . diltiazem (CARDIZEM CD) 240 MG 24 hr capsule Take 1 capsule (240 mg total) by mouth daily. (Patient not taking: Reported on 11/18/2017)   Not Taking at Unknown time   Scheduled:  . busPIRone  7.5 mg Oral Daily  . docusate sodium  100 mg Oral BID  . finasteride  5 mg Oral Daily  . metoprolol succinate  50 mg Oral Daily  . potassium chloride  20 mEq Oral Daily  . pramipexole  1 mg Oral QHS  . QUEtiapine  25 mg Oral QHS  . sodium chloride flush  3 mL Intravenous Q12H  . tamsulosin  0.4 mg Oral QPC supper  . torsemide  40 mg Oral Daily   Infusions:  . cefTRIAXone (ROCEPHIN)  IV Stopped (11/19/17 0851)  . heparin 1,650 Units/hr (11/19/17 2119)   PRN: acetaminophen **OR** acetaminophen, albuterol, cyclobenzaprine, morphine injection, ondansetron **OR** ondansetron (ZOFRAN) IV, oxyCODONE, polyethylene glycol Anti-infectives (From admission, onward)   Start     Dose/Rate Route Frequency Ordered Stop   11/18/17 1400  cefTRIAXone (ROCEPHIN) 1 g in dextrose 5 % 50 mL IVPB     1 g 100 mL/hr over 30 Minutes Intravenous Daily 11/18/17 1227        Assessment: 76 year old male requiring heparin anticoagulation for the treatment of PE.  Goal of Therapy:  Heparin level 0.3-0.7 units/ml Monitor platelets by anticoagulation protocol: Yes   Plan:  Give 4900 units bolus x 1 Start heparin infusion at 1300 units/hr Check anti-Xa level in 6 hours and daily while on heparin Continue to monitor H&H and platelets   12/8 @ 1600 HL 0.13 subtherapeutic. This level wasn't addressed. Rebolusing w/ heparin 2700 units IV x 1 and increasing the rate to 1450 units/hr and rechecking @ 1100.   12/9 @ 1052 HL = 0.34. Will continue current drip rate and recheck HL tonight at 19:00.   12/9 1854 HL subtherapeutic. 1350 units IV x 1 bolus and increase rate to 1650 units/hr. Will recheck HL in 8 hours.  12/10 @ 0545 HL 0.34 therapeutic. Will continue current rate and will recheck HL @ 1400. CBC  stable  Tobie Lords, [facility] Clinical Pharmacist 11/20/2017, 2:19 PM

## 2017-11-20 NOTE — Evaluation (Signed)
Physical Therapy Evaluation Patient Details Name: Ricardo Erickson MRN: [id number] DOB: 12-Oct-1941 Today's Date: 11/20/2017   History of Present Illness  Pt is a81 y.o.malewith a known history of pulmonary embolism, mild cognitive impairment, hypertension, COPD, congestive heart failure, chronic back pain presents to the emergency room due to weakness and falls. He also complains of shortness of breath and chest pain. A CTA of the chest showed multiple bilateral pulmonary emboli with right heart strain and rib fractures. Patient mentions that he is compliant with his medications. Although he is a poor historian compliance is questionable.  Assessment includes: Multiple bilateral pulmonary emboli noted on CT chest with IV heparin received now on oral eliquis, multiple L rib fractures, acute delirium, dementia, and UTI.       Clinical Impression  Pt presents with deficits in strength, transfers, mobility, gait, balance, and activity tolerance.  Of note, pt on IV heparin greater than the 48 hour window required to participate with exertional activities after diagnosis of PE.  Pt limited with bed mobility by L sided pain secondary to multiple L rib fractures and required encouragement and +2 Max A for sup to/from sit.  Once in sitting at EOB pt reported feeling better and verbalized appreciation for assist with getting up to sitting.  Pt required Mod A for sit to/from stand transfers for EOB again with encouragement and cues for proper sequencing.  Pt presented with significant trunk flexion in standing with RW that did not improve with verbal cues and was unable to take a step forward.  Pt was able, however, to remain in standing for several minutes with no adverse symptoms reported.  Pt will benefit from PT services in a SNF setting upon discharge to safely address above deficits for decreased caregiver assistance and eventual return to PLOF.      Follow Up Recommendations SNF    Equipment  Recommendations  None recommended by PT    Recommendations for Other Services       Precautions / Restrictions Precautions Precautions: Fall Restrictions Weight Bearing Restrictions: No      Mobility  Bed Mobility Overal bed mobility: Needs Assistance Bed Mobility: Supine to Sit;Sit to Supine     Supine to sit: +2 for physical assistance;Max assist Sit to supine: +2 for physical assistance;Max assist   General bed mobility comments: Max verbal and tactile cues for sequencing; pt very guarded with bed mobility secondary to L rib pain but felt better once sitting up at EOB and was happy to be up  Transfers Overall transfer level: Needs assistance Equipment used: Rolling walker (2 wheeled) Transfers: Sit to/from Stand Sit to Stand: Mod assist         General transfer comment: Mod verbal and tactile cues for sequencing and hand placement with heavy trunk flexion in standing that did not improve with cues  Ambulation/Gait             General Gait Details: Pt unable to take a step once in standing  Stairs            Wheelchair Mobility    Modified Rankin (Stroke Patients Only)       Balance Overall balance assessment: Needs assistance Sitting-balance support: Feet unsupported;Feet supported;Single extremity supported Sitting balance-Leahy Scale: Good     Standing balance support: Bilateral upper extremity supported Standing balance-Leahy Scale: Fair Standing balance comment: Heavy trunk flexion in standing but no LOB  Pertinent Vitals/Pain Pain Assessment: 0-10 Pain Score: 7  Pain Location: L side with multiple L rib fractures Pain Descriptors / Indicators: Aching;Sore Pain Intervention(s): Limited activity within patient's tolerance;Monitored during session;Premedicated before session    Home Living Family/patient expects to be discharged to:: Private residence(No family present during history; history from  combination of patient and chart review) Living Arrangements: Alone Available Help at Discharge: Family;Available PRN/intermittently(Pt no longer has HH aid ) Type of Home: House Home Access: Stairs to enter Entrance Stairs-Rails: Psychiatric nurse of Steps: 2 Home Layout: One level Home Equipment: Cane - single point;Walker - 2 wheels;Tub bench;Shower seat;Grab bars - tub/shower;Other (comment);Walker - 4 wheels      Prior Function Level of Independence: Needs assistance   Gait / Transfers Assistance Needed: Mod Ind amb with rollator, Ind with transfers and bed mobility  ADL's / Homemaking Assistance Needed: Family assists with ADLs on occasion, IADLs, and meds        Hand Dominance   Dominant Hand: Right    Extremity/Trunk Assessment   Upper Extremity Assessment Upper Extremity Assessment: Overall WFL for tasks assessed(Somewhat limited on LUE secondary to L rib fractures and pain)    Lower Extremity Assessment Lower Extremity Assessment: Generalized weakness    Cervical / Trunk Assessment Cervical / Trunk Assessment: Kyphotic  Communication   Communication: HOH  Cognition Arousal/Alertness: Awake/alert Behavior During Therapy: WFL for tasks assessed/performed Overall Cognitive Status: No family/caregiver present to determine baseline cognitive functioning                                        General Comments      Exercises Total Joint Exercises Quad Sets: Strengthening;Both;10 reps Gluteal Sets: Strengthening;Both;10 reps Heel Slides: AAROM;Both;10 reps Hip ABduction/ADduction: AAROM;Both;10 reps Straight Leg Raises: AAROM;Both;10 reps Long Arc Quad: AROM;Both;10 reps Knee Flexion: AROM;Both;10 reps   Assessment/Plan    PT Assessment Patient needs continued PT services  PT Problem List Decreased strength;Decreased activity tolerance;Decreased balance;Decreased knowledge of use of DME;Decreased mobility       PT  Treatment Interventions DME instruction;Gait training;Stair training;Functional mobility training;Neuromuscular re-education;Balance training;Therapeutic exercise;Therapeutic activities;Patient/family education    PT Goals (Current goals can be found in the Care Plan section)  Acute Rehab PT Goals Patient Stated Goal: To get stronger PT Goal Formulation: With patient Time For Goal Achievement: 12/03/17 Potential to Achieve Goals: Good    Frequency Min 2X/week   Barriers to discharge Inaccessible home environment;Decreased caregiver support      Co-evaluation               AM-PAC PT "6 Clicks" Daily Activity  Outcome Measure Difficulty turning over in bed (including adjusting bedclothes, sheets and blankets)?: Unable Difficulty moving from lying on back to sitting on the side of the bed? : Unable Difficulty sitting down on and standing up from a chair with arms (e.g., wheelchair, bedside commode, etc,.)?: Unable Help needed moving to and from a bed to chair (including a wheelchair)?: A Lot Help needed walking in hospital room?: Total Help needed climbing 3-5 steps with a railing? : Total 6 Click Score: 7    End of Session Equipment Utilized During Treatment: Gait belt;Oxygen Activity Tolerance: Patient limited by fatigue;Patient limited by pain Patient left: in bed;with call bell/phone within reach;with nursing/sitter in room;with bed alarm set;Other (comment)(Heating pad to L side) Nurse Communication: Mobility status PT Visit Diagnosis: Muscle weakness (generalized) (M62.81);Difficulty  in walking, not elsewhere classified (R26.2)    Time: 1430-1510 PT Time Calculation (min) (ACUTE ONLY): 40 min   Charges:   PT Evaluation $PT Eval Low Complexity: 1 Low PT Treatments $Therapeutic Exercise: 8-22 mins   PT G Codes:   PT G-Codes **NOT FOR INPATIENT CLASS** Functional Assessment Tool Used: AM-PAC 6 Clicks Basic Mobility Functional Limitation: Mobility: Walking and  moving around Mobility: Walking and Moving Around Current Status (M4268): At least 80 percent but less than 100 percent impaired, limited or restricted Mobility: Walking and Moving Around Goal Status [id number]): At least 20 percent but less than 40 percent impaired, limited or restricted    D. Royetta Asal PT, DPT 11/20/17, 3:40 PM

## 2017-11-20 NOTE — NC FL2 (Addendum)
[place] LEVEL OF CARE SCREENING TOOL     IDENTIFICATION  Patient Name: Ricardo Erickson Birthdate: [birth year] Sex: male Admission Date (Current Location): 11/18/2017  [place] and [place] Number:  Engineering geologist and Address:  [facility] : [facility], [address] [place], [place] [address]      Provider Number: [id number]  Attending Physician Name and Address:  Gladstone Lighter, MD  Relative Name and Phone Number:       Current Level of Care: Hospital Recommended Level of Care: [place] Prior Approval Number:    Date Approved/Denied:   PASRR Number:  [id number]   Discharge Plan: SNF    Current Diagnoses: Patient Active Problem List   Diagnosis Date Noted  . Pulmonary emboli ([place]) 11/18/2017  . Tobacco use disorder 07/24/2017  . Encephalopathy   . Palliative care encounter   . Goals of care, counseling/discussion   . Pulmonary embolus ([place])   . UTI (urinary tract infection) 07/19/2017  . PTSD (post-traumatic stress disorder) 06/28/2017  . Adjustment disorder with anxiety 06/28/2017  . Closed compression fracture of L5 lumbar vertebra ([place])   . Left low back pain   . Weakness   . A-fib ([place]) 06/24/2017  . Prediabetes 01/18/2017  . Prostate cancer ([place]) 01/18/2017  . Anxiety 01/18/2017  . Preventative health care 04/15/2016  . Restless leg syndrome 04/03/2016  . COPD (chronic obstructive pulmonary disease) ([place]) 04/03/2016  . BPH (benign prostatic hyperplasia) 04/03/2016  . Personal history of tobacco use, presenting hazards to health 08/06/2015    Orientation RESPIRATION BLADDER Height & Weight     Self, Time, Situation, Place  O2(3 Liters Oxygen. ) Continent Weight: 204 lb 2 oz (92.6 kg)(bedscale) Height:  5\' 9"  (175.3 cm)  BEHAVIORAL SYMPTOMS/MOOD NEUROLOGICAL BOWEL NUTRITION STATUS      Continent Diet(Diet: Heart Healthy )  AMBULATORY STATUS COMMUNICATION OF NEEDS Skin   Extensive Assist Verbally  Normal                       Personal Care Assistance Level of Assistance  Bathing, Feeding, Dressing Bathing Assistance: Limited assistance Feeding assistance: Independent Dressing Assistance: Limited assistance     Functional Limitations Info  Sight, Hearing, Speech Sight Info: Adequate Hearing Info: Adequate Speech Info: Adequate    SPECIAL CARE FACTORS FREQUENCY  PT (By licensed PT), OT (By licensed OT)     PT Frequency: (5) OT Frequency: (5)            Contractures      Additional Factors Info  Code Status, Allergies Code Status Info: (DNR ) Allergies Info: (No Known Allergies. )           Current Medications (11/20/2017):  This is the current hospital active medication list Current Facility-Administered Medications  Medication Dose Route Frequency Provider Last Rate Last Dose  . acetaminophen (TYLENOL) tablet 650 mg  650 mg Oral Q6H PRN Hillary Bow, MD       Or  . acetaminophen (TYLENOL) suppository 650 mg  650 mg Rectal Q6H PRN Sudini, Alveta Heimlich, MD      . albuterol (PROVENTIL) (2.5 MG/3ML) 0.083% nebulizer solution 2.5 mg  2.5 mg Nebulization Q2H PRN Hillary Bow, MD   2.5 mg at 11/19/17 0043  . ALPRAZolam Duanne Moron) tablet 0.125 mg  0.125 mg Oral BID PRN Gladstone Lighter, MD   0.125 mg at 11/20/17 1629  . apixaban (ELIQUIS) tablet 10 mg  10 mg Oral BID Gladstone Lighter, MD  10 mg at 11/20/17 1507   Followed by  . [START ON 11/27/2017] apixaban (ELIQUIS) tablet 5 mg  5 mg Oral BID Gladstone Lighter, MD      . busPIRone (BUSPAR) tablet 7.5 mg  7.5 mg Oral Daily Hillary Bow, MD   7.5 mg at 11/20/17 0943  . cefTRIAXone (ROCEPHIN) 1 g in dextrose 5 % 50 mL IVPB  1 g Intravenous Daily Hillary Bow, MD   Stopped at 11/20/17 1204  . cyclobenzaprine (FLEXERIL) tablet 5 mg  5 mg Oral TID Gladstone Lighter, MD   5 mg at 11/20/17 1507  . docusate sodium (COLACE) capsule 100 mg  100 mg Oral BID Hillary Bow, MD   100 mg at 11/20/17 0943  . feeding  supplement (ENSURE ENLIVE) (ENSURE ENLIVE) liquid 237 mL  237 mL Oral TID BM Gladstone Lighter, MD   237 mL at 11/20/17 1629  . finasteride (PROSCAR) tablet 5 mg  5 mg Oral Daily Hillary Bow, MD   5 mg at 11/20/17 0942  . lidocaine (LIDODERM) 5 % 1 patch  1 patch Transdermal Q24H Gladstone Lighter, MD   1 patch at 11/20/17 1508  . metoprolol succinate (TOPROL-XL) 24 hr tablet 50 mg  50 mg Oral Daily Hillary Bow, MD   50 mg at 11/20/17 0943  . morphine 4 MG/ML injection 4 mg  4 mg Intravenous Q4H PRN Hillary Bow, MD   4 mg at 11/20/17 0723  . [START ON 11/21/2017] multivitamin with minerals tablet 1 tablet  1 tablet Oral Daily Gladstone Lighter, MD      . ondansetron (ZOFRAN) tablet 4 mg  4 mg Oral Q6H PRN Hillary Bow, MD       Or  . ondansetron (ZOFRAN) injection 4 mg  4 mg Intravenous Q6H PRN Sudini, Alveta Heimlich, MD      . oxyCODONE (Oxy IR/ROXICODONE) immediate release tablet 10 mg  10 mg Oral Q4H PRN Hillary Bow, MD   10 mg at 11/20/17 1242  . polyethylene glycol (MIRALAX / GLYCOLAX) packet 17 g  17 g Oral Daily PRN Hillary Bow, MD   17 g at 11/19/17 0815  . potassium chloride SA (K-DUR,KLOR-CON) CR tablet 20 mEq  20 mEq Oral Daily Hillary Bow, MD   20 mEq at 11/20/17 0943  . pramipexole (MIRAPEX) tablet 1 mg  1 mg Oral QHS Hillary Bow, MD   1 mg at 11/19/17 2203  . QUEtiapine (SEROQUEL) tablet 25 mg  25 mg Oral QHS Hillary Bow, MD   25 mg at 11/19/17 2203  . sodium chloride flush (NS) 0.9 % injection 3 mL  3 mL Intravenous Q12H Hillary Bow, MD   3 mL at 11/20/17 0944  . tamsulosin (FLOMAX) capsule 0.4 mg  0.4 mg Oral QPC supper Hillary Bow, MD   0.4 mg at 11/19/17 1718  . torsemide (DEMADEX) tablet 40 mg  40 mg Oral Daily Hillary Bow, MD   40 mg at 11/20/17 6468     Discharge Medications: Please see discharge summary for a list of discharge medications.  Relevant Imaging Results:  Relevant Lab Results:   Additional Information (SSN:  [id number])  Ezell Melikian, Veronia Beets, LCSW

## 2017-11-20 NOTE — Care Management (Addendum)
Patient admitted for bilateral PE, UTI, and Multiple rib fractures.  PCP Emily Filbert. Pharmacy [facility].  Patient was previously on Eliquis, however it is reported that it was stopped 3 weeks ago. Reported that patient lives at home alone.  Has ca, rw, rollator, and shower seat in the home.  RNCM consult placed for HH vs placement.  PT has assessed patient and recommends SNF.  Patient has previously discharged to [facility].  RNCM following for discharge planning needs.

## 2017-11-20 NOTE — Progress Notes (Signed)
Initial Nutrition Assessment  DOCUMENTATION CODES:   Obesity unspecified  INTERVENTION:  Provide Ensure Enlive po TID, each supplement provides 350 kcal and 20 grams of protein.  Provide multivitamin with minerals daily.  Encouraged adequate intake of meals, but patient very confused.  NUTRITION DIAGNOSIS:   Inadequate oral intake related to lethargy/confusion as evidenced by meal completion < 50%.  GOAL:   Patient will meet greater than or equal to 90% of their needs  MONITOR:   PO intake, Supplement acceptance, Labs, Weight trends, Skin, I & O's  REASON FOR ASSESSMENT:   Consult Assessment of nutrition requirement/status  ASSESSMENT:   76 year old male with PMHx of dementia, depression, emphysema of lung, PTSD, HTN, CHF, hx pulmonary embolism who is admitted with left rib fractures, bilateral pulmonary emboli, acute hypoxic respiratory failure.   Met with patient at bedside. RD was unable to wake patient up, but RN woke him up. Patient is sleepy and confused, so unable to provide a good history. He reports his appetite is "fine" and he is eating well. He is unable to describe how he typically eats at home. He is unsure of his weight history.  Per chart patient was 223.5 lbs on 01/18/2017 and 221.4 lbs on 06/19/2017. He has lost 17.3 lbs (7.8% body weight) over the past 5 months, which is not significant for time frame.  Meal Completion: bites to 30%  Medications reviewed and include: Colace, potassium chloride 20 mEq daily, torsemide 40 mg daily, ceftriaxone, heparin gtt.  Labs reviewed: Glucose 120.  Patient does not meet criteria for malnutrition at this time.  Discussed with RN.  NUTRITION - FOCUSED PHYSICAL EXAM:    Most Recent Value  Orbital Region  No depletion  Upper Arm Region  No depletion  Thoracic and Lumbar Region  No depletion  Buccal Region  No depletion  Temple Region  No depletion  Clavicle Bone Region  No depletion  Clavicle and Acromion Bone  Region  No depletion  Scapular Bone Region  No depletion  Dorsal Hand  No depletion  Patellar Region  No depletion  Anterior Thigh Region  No depletion  Posterior Calf Region  No depletion  Edema (RD Assessment)  Mild  Hair  Reviewed  Eyes  Unable to assess  Mouth  Unable to assess  Skin  Reviewed  Nails  Reviewed     Diet Order:  Diet Heart Room service appropriate? Yes; Fluid consistency: Thin  EDUCATION NEEDS:   Not appropriate for education at this time  Skin:  Skin Assessment: Skin Integrity Issues: Skin Integrity Issues:: Other (Comment) Other: MSAD to groin and perineum  Last BM:  11/18/2017  Height:   Ht Readings from Last 1 Encounters:  11/18/17 '5\' 9"'  (1.753 m)    Weight:   Wt Readings from Last 1 Encounters:  11/19/17 204 lb 2 oz (92.6 kg)    Ideal Body Weight:  72.7 kg  BMI:  Body mass index is 30.14 kg/m.  Estimated Nutritional Needs:   Kcal:  3154-0086 (MSJ x 1.2-1.4)  Protein:  100-110 grams (1.1-1.2 grams/kg)  Fluid:  2.2 L/day (30 mL/kg IBW)  Willey Blade, MS, RD, LDN Office: [phone] Pager: [phone] After Hours/Weekend Pager: [phone]

## 2017-11-20 NOTE — Progress Notes (Signed)
ANTICOAGULATION CONSULT NOTE - Initial Consult  Pharmacy Consult for Eliquis Indication: pulmonary embolus and DVT  No Known Allergies  Patient Measurements: Height: 5\' 9"  (175.3 cm) Weight: 204 lb 2 oz (92.6 kg)(bedscale) IBW/kg (Calculated) : 70.7 Heparin Dosing Weight:   Vital Signs: Temp: 98.6 F (37 C) (12/10 0900) Temp Source: Oral (12/10 0900) BP: 128/72 (12/10 0900) Pulse Rate: 86 (12/10 0900)  Labs: Recent Labs    11/18/17 0445 11/18/17 1056  11/19/17 1052 11/19/17 1854 11/20/17 0547  HGB 13.6  --   --  13.2  --  13.5  HCT 41.3  --   --  38.8*  --  39.7*  PLT 299  --   --  315  --  307  APTT  --  47*  --   --   --   --   LABPROT  --  14.7  --   --   --   --   INR  --  1.16  --   --   --   --   HEPARINUNFRC  --   --    < > 0.34 0.29* 0.34  CREATININE 1.32*  --   --   --   --  1.19  TROPONINI <0.03  --   --   --   --   --    < > = values in this interval not displayed.    Estimated Creatinine Clearance: 61.2 mL/min (by C-G formula based on SCr of 1.19 mg/dL).   Medical History: Past Medical History:  Diagnosis Date  . CHF (congestive heart failure) ([place])   . Chronic back pain   . Dementia   . Depression   . Emphysema lung ([place])   . Hypertension   . Personal history of tobacco use, presenting hazards to health 08/06/2015  . Pollen allergies   . PTSD (post-traumatic stress disorder)   . Pulmonary embolism ([facility])     Medications:  Infusions:  . cefTRIAXone (ROCEPHIN)  IV Stopped (11/20/17 1204)    Assessment: 76 yom requiring anticoagulation for the treatment of PE.  Goal of Therapy:  Resolve PE, prevent ADE Monitor platelets by anticoagulation protocol: Yes   Plan:  Stop heparin infusion and start Eliquis 10 mg po BID x 7 days followed by Eliquis 5 mg po BID.  Laural Benes, Pharm.D., BCPS Clinical Pharmacist 11/20/2017,1:36 PM

## 2017-11-21 LAB — BASIC METABOLIC PANEL
Anion gap: 12 (ref 5–15)
BUN: 25 mg/dL — AB (ref 6–20)
CALCIUM: 9.1 mg/dL (ref 8.9–10.3)
CO2: 27 mmol/L (ref 22–32)
Chloride: 100 mmol/L — ABNORMAL LOW (ref 101–111)
Creatinine, Ser: 1.43 mg/dL — ABNORMAL HIGH (ref 0.61–1.24)
GFR, EST AFRICAN AMERICAN: 54 mL/min — AB (ref >60)
GFR, EST NON AFRICAN AMERICAN: 47 mL/min — AB (ref >60)
GLUCOSE: 105 mg/dL — AB (ref 65–99)
POTASSIUM: 3.6 mmol/L (ref 3.5–5.1)
SODIUM: 139 mmol/L (ref 135–145)

## 2017-11-21 MED ORDER — POLYETHYLENE GLYCOL 3350 17 G PO PACK
17.0000 g | PACK | Freq: Every day | ORAL | Status: DC
  Administered 2017-11-21 – 2017-11-22 (×2): 17 g via ORAL
  Filled 2017-11-21 (×2): qty 1

## 2017-11-21 MED ORDER — SODIUM CHLORIDE 0.9 % IV SOLN
INTRAVENOUS | Status: DC
  Administered 2017-11-21 – 2017-11-22 (×2): via INTRAVENOUS

## 2017-11-21 MED ORDER — CIPROFLOXACIN HCL 500 MG PO TABS
500.0000 mg | ORAL_TABLET | Freq: Two times a day (BID) | ORAL | Status: DC
  Administered 2017-11-21 – 2017-11-22 (×2): 500 mg via ORAL
  Filled 2017-11-21 (×2): qty 1

## 2017-11-21 NOTE — Clinical Social Work Note (Signed)
Clinical Social Work Assessment  Patient Details  Name: Ricardo Erickson MRN: [id number] Date of Birth: 1941-08-26  Date of referral:  11/21/17               Reason for consult:  Facility Placement                Permission sought to share information with:  Chartered certified accountant granted to share information::  Yes, Verbal Permission Granted  Name::      [place]::   [place]   Relationship::     Contact Information:     Housing/Transportation Living arrangements for the past 2 months:  [place] of Information:  Adult Children, Power of Attorney Patient Interpreter Needed:  None Criminal Activity/Legal Involvement Pertinent to Current Situation/Hospitalization:  No - Comment as needed Significant Relationships:  Adult Children Lives with:  Self Do you feel safe going back to the place where you live?  No Need for family participation in patient care:  Yes (Comment)  Care giving concerns:  Patient lives alone in [place].    Social Worker assessment / plan:  Holiday representative (CSW) reviewed chart and noted that PT is recommending SNF. Per chart patient is not alert and oriented. CSW contacted patient's son [name] to complete assessment. Per son patient lives alone in [place] and him and his sister Keane Police share HPOA for patient. Per son he lives in [place] and [place] lives in [place]. Per son patient was recently at [facility] for rehab and discharged home from there in Nov. 2018. CSW explained that PT is recommending SNF again and that [facility] will have to approve SNF. CSW also discussed long term care options including private pay and long term care medicaid. Son verbalized his understanding and is agreeable to SNF search in [place]. Son prefers [facility]. FL2 complete and faxed out. CSW started [facility] SNF authorization through [place] [facility]. CSW will continue to follow and assist  as needed.   Employment status:  Disabled (Comment on whether or not currently receiving Disability), Retired Nurse, adult PT Recommendations:  [facility] / Referral to community resources:  [place]  Patient/Family's Response to care:  Patient's son is agreeable to [facility] in [place].   Patient/Family's Understanding of and Emotional Response to Diagnosis, Current Treatment, and Prognosis:  Patient's son was very pleasant and thanked CSW for assistance.   Emotional Assessment Appearance:  Appears stated age Attitude/Demeanor/Rapport:  Unable to Assess Affect (typically observed):  Unable to Assess Orientation:  Oriented to Self, Oriented to Place, Oriented to  Time, Fluctuating Orientation (Suspected and/or reported [place]) Alcohol / Substance use:  Not Applicable Psych involvement (Current and /or in the community):  No (Comment)  Discharge Needs  Concerns to be addressed:  Discharge Planning Concerns Readmission within the last 30 days:  No Current discharge risk:  Dependent with Mobility Barriers to Discharge:  Continued Medical Work up   [employer], Veronia Beets, LCSW 11/21/2017, 9:14 AM

## 2017-11-21 NOTE — Progress Notes (Signed)
Physical Therapy Treatment Patient Details Name: Ricardo Erickson MRN: [id number] DOB: September 14, 1941 Today's Date: 11/21/2017    History of Present Illness Pt is a70 y.o.malewith a known history of pulmonary embolism, mild cognitive impairment, hypertension, COPD, congestive heart failure, chronic back pain presents to the emergency room due to weakness and falls. He also complains of shortness of breath and chest pain. A CTA of the chest showed multiple bilateral pulmonary emboli with right heart strain and rib fractures. Patient mentions that he is compliant with his medications. Although he is a poor historian compliance is questionable.  Assessment includes: Multiple bilateral pulmonary emboli noted on CT chest with IV heparin received now on oral eliquis, multiple L rib fractures, acute delirium, dementia, and UTI.    PT Comments    Pt in bed, unable to recall session yesterday.  Fearful of movement due to pain.  After encouragement, he agreed to attempt mobility skills.  To edge of bed with mod/max a x 2 with very slow movements giving pt time to attempt on his own.  Once sitting, he remained upright for 6 minutes while letting pain subside and discussion with MD.  He was able to stand with walker and min/mod a x 2 and progressed to taking small cautious steps to the recliner at bedside.  He remained in recliner at end of session.  Pt overall did well despite pain and fear.   Follow Up Recommendations  SNF     Equipment Recommendations  None recommended by PT    Recommendations for Other Services       Precautions / Restrictions Precautions Precautions: Fall Restrictions Weight Bearing Restrictions: No    Mobility  Bed Mobility Overal bed mobility: Needs Assistance Bed Mobility: Supine to Sit     Supine to sit: +2 for physical assistance;Max assist;Mod assist     General bed mobility comments: very slow with encouragement and cues.  very fearful of initiating  movement/pain but dows well once moving  Transfers Overall transfer level: Needs assistance Equipment used: Rolling walker (2 wheeled) Transfers: Sit to/from Stand Sit to Stand: Mod assist;+2 physical assistance         General transfer comment: trunk remains flexed while standing but more upright today  Ambulation/Gait Ambulation/Gait assistance: +2 physical assistance;Min assist;Mod assist   Assistive device: Rolling walker (2 wheeled) Gait Pattern/deviations: Step-to pattern   Gait velocity interpretation: <1.8 ft/sec, indicative of risk for recurrent falls General Gait Details: Able to take small cautious steps today to turn to chair.   Stairs            Wheelchair Mobility    Modified Rankin (Stroke Patients Only)       Balance Overall balance assessment: Needs assistance Sitting-balance support: Feet unsupported;Feet supported;Single extremity supported Sitting balance-Leahy Scale: Good     Standing balance support: Bilateral upper extremity supported Standing balance-Leahy Scale: Fair                              Cognition Arousal/Alertness: Awake/alert Behavior During Therapy: WFL for tasks assessed/performed Overall Cognitive Status: No family/caregiver present to determine baseline cognitive functioning                                        Exercises Other Exercises Other Exercises: sitting edge of bed approx 5 minutes to let pain subside and for  discussion with MD.    General Comments        Pertinent Vitals/Pain Pain Assessment: Faces Faces Pain Scale: Hurts whole lot Pain Location: L side with multiple L rib fractures Pain Descriptors / Indicators: Aching;Sore Pain Intervention(s): Limited activity within patient's tolerance;Monitored during session;Heat applied    Home Living                      Prior Function            PT Goals (current goals can now be found in the care plan section)  Progress towards PT goals: Progressing toward goals    Frequency    Min 2X/week      PT Plan Current plan remains appropriate    Co-evaluation              AM-PAC PT "6 Clicks" Daily Activity  Outcome Measure  Difficulty turning over in bed (including adjusting bedclothes, sheets and blankets)?: Unable Difficulty moving from lying on back to sitting on the side of the bed? : Unable Difficulty sitting down on and standing up from a chair with arms (e.g., wheelchair, bedside commode, etc,.)?: Unable Help needed moving to and from a bed to chair (including a wheelchair)?: A Lot Help needed walking in hospital room?: A Lot Help needed climbing 3-5 steps with a railing? : Total 6 Click Score: 8    End of Session Equipment Utilized During Treatment: Gait belt;Oxygen Activity Tolerance: Patient limited by pain Patient left: in chair;with chair alarm set;with call bell/phone within reach;with nursing/sitter in room Nurse Communication: Mobility status       Time: 1024-1050 PT Time Calculation (min) (ACUTE ONLY): 26 min  Charges:  $Gait Training: 8-22 mins $Therapeutic Activity: 8-22 mins                    G Codes:       Chesley Noon, PTA 11/21/17, 11:00 AM

## 2017-11-21 NOTE — Progress Notes (Signed)
Clinical Education officer, museum (CSW) contacted patient's son [name] and presented bed offers. Son chose [facility]. [facility] Medicare SNF authorization has been received through [facility], auth # [id number], [place]. PASARR is pending. [facility] admissions coordinator at [facility] is aware of above. Patient's son [name] is aware of above. CSW will continue to follow and assist as needed.   [employer], LCSW [phone]

## 2017-11-21 NOTE — Progress Notes (Signed)
[facility] NAME: Ricardo Erickson    MR#:  [id number]  DATE OF BIRTH:  1941-07-30  SUBJECTIVE:  CHIEF COMPLAINT:   Chief Complaint  Patient presents with  . Weakness  . Fall   - was working with PT today, very scared about his left sided chest pain with movement -But was able to sit in a chair today.  REVIEW OF SYSTEMS:  Review of Systems  Constitutional: Negative for chills, fever and malaise/fatigue.  HENT: Negative for ear discharge, hearing loss and nosebleeds.   Eyes: Negative for blurred vision.  Respiratory: Positive for shortness of breath. Negative for cough and wheezing.   Cardiovascular: Positive for chest pain. Negative for palpitations.  Gastrointestinal: Negative for abdominal pain, constipation, diarrhea, nausea and vomiting.  Genitourinary: Negative for dysuria.  Musculoskeletal: Positive for myalgias.  Neurological: Negative for dizziness, speech change, focal weakness, seizures and headaches.  Psychiatric/Behavioral: Negative for depression.    DRUG ALLERGIES:  No Known Allergies  VITALS:  Blood pressure 106/60, pulse 72, temperature 97.8 F (36.6 C), temperature source Oral, resp. rate 20, height 5\' 9"  (1.753 m), weight 95.3 kg (210 lb), SpO2 97 %.  PHYSICAL EXAMINATION:  Physical Exam  GENERAL:  76 y.o.-year-old elderly patient lying in the bed with no acute distress.  EYES: Pupils equal, round, reactive to light and accommodation. No scleral icterus. Extraocular muscles intact.  HEENT: Head atraumatic, normocephalic. Oropharynx and nasopharynx clear.  NECK:  Supple, no jugular venous distention. No thyroid enlargement, no tenderness.  LUNGS: Normal breath sounds bilaterally, no wheezing, rales,rhonchi or crepitation. No use of accessory muscles of respiration. Decreased basilar breath sounds CARDIOVASCULAR: S1, S2 normal. No rubs, or gallops. 3/6 systolic murmur present ABDOMEN: Soft, nontender,  nondistended. Bowel sounds present. No organomegaly or mass.  EXTREMITIES: No pedal edema, cyanosis, or clubbing.  NEUROLOGIC: Cranial nerves II through XII are intact. Muscle strength 5/5 in all extremities. Sensation intact. Gait not checked.  PSYCHIATRIC: The patient is alert and oriented x 1-2, some confused conversation. No recall of recent events noted.  SKIN: No obvious rash, lesion, or ulcer.    LABORATORY PANEL:   CBC Recent Labs  Lab 11/20/17 0547  WBC 14.9*  HGB 13.5  HCT 39.7*  PLT 307   ------------------------------------------------------------------------------------------------------------------  Chemistries  Recent Labs  Lab 11/18/17 0445  11/21/17 0449  NA 135   < > 139  K 3.7   < > 3.6  CL 96*   < > 100*  CO2 28   < > 27  GLUCOSE 118*   < > 105*  BUN 21*   < > 25*  CREATININE 1.32*   < > 1.43*  CALCIUM 9.3   < > 9.1  AST 22  --   --   ALT 7*  --   --   ALKPHOS 80  --   --   BILITOT 0.3  --   --    < > = values in this interval not displayed.   ------------------------------------------------------------------------------------------------------------------  Cardiac Enzymes Recent Labs  Lab 11/18/17 0445  TROPONINI <0.03   ------------------------------------------------------------------------------------------------------------------  RADIOLOGY:  No results found.  EKG:   Orders placed or performed during the hospital encounter of 11/18/17  . ED EKG  . ED EKG  . EKG 12-Lead  . EKG 12-Lead    ASSESSMENT AND PLAN:   76 year old male with past medical history significant for congestive heart failure, early dementia, depression, COPD not on home oxygen,  history of PE after back surgery presents to hospital secondary to a fall and noted to have bilateral pulmonary emboli.  1. Multiple bilateral pulmonary emboli-noted on CT chest. Patient was treated with eliquis for PE after his back surgery for 6 months and he was just recently stopped  about 3 weeks ago, according to son. -CT with still bilateral emboli and Doppler's with right peroneal DVT -Echocardiogram with no right heart strain. -Received IV heparin, changed to oral eliquis and patient may need lifelong anticoagulation at this point. -Discussed the risks with son at bedside  2. Multiple liver rib fractures-left-sided rib fracture secondary to fall. -Complains of significant pain. Added Lidoderm patch, scheduled Flexeril and still use narcotic when necessary pain medications for now. - if no improvement- consider nerve block? Discussed with anasthesia today  3. Acute delirium-secondary to being in the hospital, on top of dementia -Worsened with pain medications as well. Continue to monitor closely.  4. UTI-recent BPH and two-month Foley catheter which was recently removed. Likely could have been the source. -urine cultures with citrobacter. Continue Rocephin- change to oral tomorrow  5. DVT prophylaxis-already on eliquis  6. Mild renal insufficiency-gentle IV fluids started today  Physical therapy consulted. will need rehabilitation at discharge   All the records are reviewed and case discussed with Care Management/Social Workerr. Management plans discussed with the patient, family and they are in agreement.  CODE STATUS: DNR  TOTAL TIME TAKING CARE OF THIS PATIENT: 39 minutes.   POSSIBLE D/C IN 1-2 DAYS, DEPENDING ON CLINICAL CONDITION.   Gladstone Lighter M.D on 11/21/2017 at 1:39 PM  Between 7am to 6pm - Pager - [phone]  After 6pm go to [email or web address] - password [email or web address] [facility]  Office  [phone]  CC: Primary care physician; Rusty Aus, MD

## 2017-11-21 NOTE — Care Management Important Message (Signed)
Important Message  Patient Details  Name: Ricardo Erickson MRN: [id number] Date of Birth: [birth year]   Medicare Important Message Given:  Yes    Shelbie Ammons, RN 11/21/2017, 7:13 AM

## 2017-11-21 NOTE — Clinical Social Work Placement (Signed)
   CLINICAL SOCIAL WORK PLACEMENT  NOTE  Date:  11/21/2017  Patient Details  Name: Ricardo Erickson MRN: [id number] Date of Birth: 1941-09-10  Clinical Social Work is seeking post-discharge placement for this patient at the [place] level of care (*CSW will initial, date and re-position this form in  chart as items are completed):  Yes   Patient/family provided with [facility] Work Department's list of facilities offering this level of care within the geographic area requested by the patient (or if unable, by the patient's family).  Yes   Patient/family informed of their freedom to choose among providers that offer the needed level of care, that participate in Medicare, Medicaid or managed care program needed by the patient, have an available bed and are willing to accept the patient.  Yes   Patient/family informed of [facility]'s ownership interest in [facility] and [facility], as well as of the fact that they are under no obligation to receive care at these facilities.  PASRR submitted to EDS on 11/21/17     PASRR number received on       Existing PASRR number confirmed on       FL2 transmitted to all facilities in geographic area requested by pt/family on 11/21/17     FL2 transmitted to all facilities within larger geographic area on       Patient informed that his/her managed care company has contracts with or will negotiate with certain facilities, including the following:            Patient/family informed of bed offers received.  Patient chooses bed at       Physician recommends and patient chooses bed at      Patient to be transferred to   on  .  Patient to be transferred to facility by       Patient family notified on   of transfer.  Name of family member notified:        PHYSICIAN       Additional Comment:    _______________________________________________ Voyd Groft, Veronia Beets, LCSW 11/21/2017, 9:11 AM

## 2017-11-21 NOTE — Progress Notes (Signed)
Chaplain made a follow up visit with this pt. Pt was sitting in the recliner chair at this time. Pt told [place] that he had moderated pain today. Pt was reflective again today. Pt talked about his deceased wife and his wife's faith and commitment to church. Pt said he regrets for not using his life to make impact of other people as his wife did. Pt also talked about other social and personal subjects that were important to him. Pt was calm and reflective and asked for prayers, which the chaplain provided with a ministry of presence.    11/21/17 1300  Clinical Encounter Type  Visited With Patient  Visit Type Follow-up;Spiritual support;Other (Comment)  Referral From Chaplain  Consult/Referral To Chaplain  Spiritual Encounters  Spiritual Needs Prayer;Emotional

## 2017-11-22 DIAGNOSIS — S2242XD Multiple fractures of ribs, left side, subsequent encounter for fracture with routine healing: Secondary | ICD-10-CM | POA: Diagnosis not present

## 2017-11-22 DIAGNOSIS — M6281 Muscle weakness (generalized): Secondary | ICD-10-CM | POA: Diagnosis not present

## 2017-11-22 DIAGNOSIS — N390 Urinary tract infection, site not specified: Secondary | ICD-10-CM | POA: Diagnosis not present

## 2017-11-22 DIAGNOSIS — Z720 Tobacco use: Secondary | ICD-10-CM | POA: Diagnosis not present

## 2017-11-22 DIAGNOSIS — I5022 Chronic systolic (congestive) heart failure: Secondary | ICD-10-CM | POA: Diagnosis not present

## 2017-11-22 DIAGNOSIS — I110 Hypertensive heart disease with heart failure: Secondary | ICD-10-CM | POA: Diagnosis not present

## 2017-11-22 DIAGNOSIS — I509 Heart failure, unspecified: Secondary | ICD-10-CM | POA: Diagnosis not present

## 2017-11-22 DIAGNOSIS — J449 Chronic obstructive pulmonary disease, unspecified: Secondary | ICD-10-CM | POA: Diagnosis not present

## 2017-11-22 DIAGNOSIS — I2602 Saddle embolus of pulmonary artery with acute cor pulmonale: Secondary | ICD-10-CM | POA: Diagnosis not present

## 2017-11-22 DIAGNOSIS — I4891 Unspecified atrial fibrillation: Secondary | ICD-10-CM | POA: Diagnosis not present

## 2017-11-22 DIAGNOSIS — J439 Emphysema, unspecified: Secondary | ICD-10-CM | POA: Diagnosis not present

## 2017-11-22 DIAGNOSIS — I824Z1 Acute embolism and thrombosis of unspecified deep veins of right distal lower extremity: Secondary | ICD-10-CM | POA: Diagnosis not present

## 2017-11-22 DIAGNOSIS — J9601 Acute respiratory failure with hypoxia: Secondary | ICD-10-CM | POA: Diagnosis not present

## 2017-11-22 DIAGNOSIS — I2699 Other pulmonary embolism without acute cor pulmonale: Secondary | ICD-10-CM | POA: Diagnosis not present

## 2017-11-22 DIAGNOSIS — Z743 Need for continuous supervision: Secondary | ICD-10-CM | POA: Diagnosis not present

## 2017-11-22 DIAGNOSIS — I480 Paroxysmal atrial fibrillation: Secondary | ICD-10-CM | POA: Diagnosis not present

## 2017-11-22 DIAGNOSIS — G2581 Restless legs syndrome: Secondary | ICD-10-CM | POA: Diagnosis not present

## 2017-11-22 DIAGNOSIS — W19XXXD Unspecified fall, subsequent encounter: Secondary | ICD-10-CM | POA: Diagnosis not present

## 2017-11-22 DIAGNOSIS — Z7901 Long term (current) use of anticoagulants: Secondary | ICD-10-CM | POA: Diagnosis not present

## 2017-11-22 LAB — BASIC METABOLIC PANEL
ANION GAP: 10 (ref 5–15)
BUN: 24 mg/dL — ABNORMAL HIGH (ref 6–20)
CALCIUM: 8.7 mg/dL — AB (ref 8.9–10.3)
CO2: 29 mmol/L (ref 22–32)
CREATININE: 1.28 mg/dL — AB (ref 0.61–1.24)
Chloride: 99 mmol/L — ABNORMAL LOW (ref 101–111)
GFR, EST NON AFRICAN AMERICAN: 53 mL/min — AB (ref >60)
Glucose, Bld: 100 mg/dL — ABNORMAL HIGH (ref 65–99)
Potassium: 3.3 mmol/L — ABNORMAL LOW (ref 3.5–5.1)
SODIUM: 138 mmol/L (ref 135–145)

## 2017-11-22 MED ORDER — APIXABAN 5 MG PO TABS: 10.0000 mg | ORAL_TABLET | Freq: Two times a day (BID) | ORAL | 0 refills | Status: DC

## 2017-11-22 MED ORDER — APIXABAN 5 MG PO TABS: 5.0000 mg | ORAL_TABLET | Freq: Two times a day (BID) | ORAL | 1 refills | Status: AC

## 2017-11-22 MED ORDER — PRAMIPEXOLE DIHYDROCHLORIDE 1 MG PO TABS: 1.0000 mg | ORAL_TABLET | Freq: Every day | ORAL | 1 refills | Status: AC

## 2017-11-22 MED ORDER — POTASSIUM CHLORIDE CRYS ER 20 MEQ PO TBCR
40.0000 meq | EXTENDED_RELEASE_TABLET | Freq: Once | ORAL | Status: AC
  Administered 2017-11-22: 08:00:00 40 meq via ORAL
  Filled 2017-11-22: qty 2

## 2017-11-22 MED ORDER — ALPRAZOLAM 0.25 MG PO TABS: 0.2500 mg | ORAL_TABLET | Freq: Two times a day (BID) | ORAL | 0 refills | Status: DC | PRN

## 2017-11-22 MED ORDER — CYCLOBENZAPRINE HCL 5 MG PO TABS: 5.0000 mg | ORAL_TABLET | Freq: Three times a day (TID) | ORAL | 0 refills | Status: AC

## 2017-11-22 MED ORDER — DOCUSATE SODIUM 100 MG PO CAPS: 100.0000 mg | ORAL_CAPSULE | Freq: Two times a day (BID) | ORAL | 0 refills | Status: DC

## 2017-11-22 MED ORDER — ENSURE ENLIVE PO LIQD: 237.0000 mL | Freq: Three times a day (TID) | ORAL | 12 refills | Status: DC

## 2017-11-22 MED ORDER — OXYCODONE HCL 5 MG PO TABS
5.0000 mg | ORAL_TABLET | Freq: Four times a day (QID) | ORAL | 0 refills | Status: DC | PRN
Start: 2017-11-22 — End: 2018-11-28

## 2017-11-22 MED ORDER — POLYETHYLENE GLYCOL 3350 17 G PO PACK: 17.0000 g | PACK | Freq: Every day | ORAL | 0 refills | Status: DC | PRN

## 2017-11-22 MED ORDER — CIPROFLOXACIN HCL 500 MG PO TABS: 500.0000 mg | ORAL_TABLET | Freq: Two times a day (BID) | ORAL | 0 refills | Status: DC

## 2017-11-22 MED ORDER — ALPRAZOLAM 0.25 MG PO TABS
0.2500 mg | ORAL_TABLET | Freq: Once | ORAL | Status: AC
  Administered 2017-11-22: 15:00:00 0.25 mg via ORAL
  Filled 2017-11-22: qty 1

## 2017-11-22 NOTE — Discharge Summary (Addendum)
[facility] [place] NAME: Ricardo Erickson    MR#:  [id number]  DATE OF BIRTH:  1941-10-07  DATE OF ADMISSION:  11/18/2017   ADMITTING PHYSICIAN: Hillary Bow, MD  DATE OF DISCHARGE: 11/22/17  PRIMARY CARE PHYSICIAN: Rusty Aus, MD   ADMISSION DIAGNOSIS:   Pulmonary embolism ([place]) [I26.99] Left-sided chest wall pain [R07.89] Fall, initial encounter [W19.XXXA] Closed fracture of multiple ribs of left side, initial encounter [S22.42XA] Acute respiratory failure with hypoxemia (HCC) [J96.01] Other acute pulmonary embolism with acute cor pulmonale ([place]) [I26.09]  DISCHARGE DIAGNOSIS:   Active Problems:   Pulmonary emboli (HCC)   SECONDARY DIAGNOSIS:   Past Medical History:  Diagnosis Date  . CHF (congestive heart failure) ([place])   . Chronic back pain   . Dementia   . Depression   . Emphysema lung ([place])   . Hypertension   . Personal history of tobacco use, presenting hazards to health 08/06/2015  . Pollen allergies   . PTSD (post-traumatic stress disorder)   . Pulmonary embolism [facility])     HOSPITAL COURSE:   76 year old male with past medical history significant for congestive heart failure, early dementia, depression, COPD not on home oxygen, history of PE after back surgery presents to hospital secondary to a fall and noted to have bilateral pulmonary emboli.  1. Multiple bilateral pulmonary emboli-noted on CT chest. Patient was treated with eliquis for PE after his back surgery for 6 months and he was just recently stopped about 3 weeks ago, according to son. -CT with still bilateral emboli and Doppler's with right peroneal DVT -Echocardiogram with no right heart strain. -Received IV heparin, changed to oral eliquis and patient may need lifelong anticoagulation at this point. -Discussed the risks with son at bedside  2. Multiple left sided rib fractures-left-sided rib fracture secondary to fall. -Complains of  significant pain. Added Lidoderm patch, scheduled Flexeril and use narcotic when necessary pain medications for now. -slowly improving, patient is very scared to work with therapy- needs lot of re-assuring - also use k-pad/heating pad on that side  3. Acute delirium-secondary to being in the hospital, on top of dementia -Worsened with pain medications as well. Continue to monitor closely. Seems close to baseline  4. UTI-recent BPH and two-month Foley catheter which was recently removed. Likely could have been the source. -urine cultures with citrobacter. received Rocephin- changed to cipro based on sensitivities  5. Mild renal insufficiency-received IV fluids. Improved today. Hold torsemide for now. Please restart torsemide whenever he is well hydrated  Physical therapy consulted. will need rehabilitation at discharge Being discharged to [facility] today    DISCHARGE CONDITIONS:   Guarded  CONSULTS OBTAINED:   Treatment Team:  Cammie Sickle, MD Jacinta Shoe, MD  DRUG ALLERGIES:   No Known Allergies DISCHARGE MEDICATIONS:   Allergies as of 11/22/2017   No Known Allergies     Medication List    STOP taking these medications   aspirin 81 MG EC tablet   digoxin 0.125 MG tablet Commonly known as:  LANOXIN   diltiazem 240 MG 24 hr capsule Commonly known as:  CARDIZEM CD   potassium chloride 10 MEQ tablet Commonly known as:  K-DUR,KLOR-CON   torsemide 20 MG tablet Commonly known as:  DEMADEX   traMADol 50 MG tablet Commonly known as:  ULTRAM     TAKE these medications   acetaminophen 650 MG CR tablet Commonly known as:  TYLENOL Take 650 mg by  mouth every 8 (eight) hours as needed for pain.   albuterol 108 (90 Base) MCG/ACT inhaler Commonly known as:  PROVENTIL HFA;VENTOLIN HFA Inhale 2 puffs into the lungs as needed for wheezing or shortness of breath.   albuterol (2.5 MG/3ML) 0.083% nebulizer solution Commonly known as:   PROVENTIL Inhale 2.5 mg into the lungs every 6 (six) hours as needed.   apixaban 5 MG Tabs tablet Commonly known as:  ELIQUIS Take 2 tablets (10 mg total) by mouth 2 (two) times daily for 5 days. What changed:    how much to take  how to take this  when to take this  additional instructions   apixaban 5 MG Tabs tablet Commonly known as:  ELIQUIS Take 1 tablet (5 mg total) by mouth 2 (two) times daily. Start taking on:  11/28/2017 What changed:  You were already taking a medication with the same name, and this prescription was added. Make sure you understand how and when to take each.   busPIRone 7.5 MG tablet Commonly known as:  BUSPAR Take 1 tablet twice daily for anxiety. What changed:    how much to take  how to take this  when to take this  additional instructions   ciprofloxacin 500 MG tablet Commonly known as:  CIPRO Take 1 tablet (500 mg total) by mouth 2 (two) times daily. X 5 days   cyclobenzaprine 5 MG tablet Commonly known as:  FLEXERIL Take 1 tablet (5 mg total) by mouth 3 (three) times daily for 3 days.   docusate sodium 100 MG capsule Commonly known as:  COLACE Take 1 capsule (100 mg total) by mouth 2 (two) times daily.   feeding supplement (ENSURE ENLIVE) Liqd Take 237 mLs by mouth 3 (three) times daily between meals.   finasteride 5 MG tablet Commonly known as:  PROSCAR Take 1 tablet (5 mg total) by mouth daily.   lidocaine 5 % Commonly known as:  LIDODERM Place 1 patch onto the skin daily. Remove & Discard patch within 12 hours or as directed by MD   metoprolol succinate 50 MG 24 hr tablet Commonly known as:  TOPROL-XL Take 50 mg by mouth daily. Take with or immediately following a meal.   oxyCODONE 5 MG immediate release tablet Commonly known as:  ROXICODONE Take 1-2 tablets (5-10 mg total) by mouth every 6 (six) hours as needed for moderate pain or severe pain.   polyethylene glycol packet Commonly known as:  MIRALAX /  GLYCOLAX Take 17 g by mouth daily as needed for mild constipation.   pramipexole 1 MG tablet Commonly known as:  MIRAPEX Take 1 tablet (1 mg total) by mouth at bedtime.   QUEtiapine 25 MG tablet Commonly known as:  SEROQUEL Take 1 tablet (25 mg total) by mouth at bedtime.   tamsulosin 0.4 MG Caps capsule Commonly known as:  FLOMAX Take 0.4 mg by mouth daily after supper.        DISCHARGE INSTRUCTIONS:   1. PCP f/u in 1-2 weeks 2. Incentive spirometer  DIET:   Cardiac diet  ACTIVITY:   Activity as tolerated  OXYGEN:   Home Oxygen: No.  Oxygen Delivery: room air  DISCHARGE LOCATION:   nursing home   If you experience worsening of your admission symptoms, develop shortness of breath, life threatening emergency, suicidal or homicidal thoughts you must seek medical attention immediately by calling 911 or calling your MD immediately  if symptoms less severe.  You Must read complete instructions/literature along with all  the possible adverse reactions/side effects for all the Medicines you take and that have been prescribed to you. Take any new Medicines after you have completely understood and accpet all the possible adverse reactions/side effects.   Please note  You were cared for by a hospitalist during your hospital stay. If you have any questions about your discharge medications or the care you received while you were in the hospital after you are discharged, you can call the unit and asked to speak with the hospitalist on call if the hospitalist that took care of you is not available. Once you are discharged, your primary care physician will handle any further medical issues. Please note that NO REFILLS for any discharge medications will be authorized once you are discharged, as it is imperative that you return to your primary care physician (or establish a relationship with a primary care physician if you do not have one) for your aftercare needs so that they can  reassess your need for medications and monitor your lab values.    On the day of Discharge:  VITAL SIGNS:   Blood pressure 101/63, pulse 64, temperature 98.9 F (37.2 C), temperature source Oral, resp. rate 18, height 5\' 9"  (1.753 m), weight 97.1 kg (214 lb), SpO2 92 %.  PHYSICAL EXAMINATION:    GENERAL:  76 y.o.-year-old elderly patient lying in the bed with no acute distress.  EYES: Pupils equal, round, reactive to light and accommodation. No scleral icterus. Extraocular muscles intact.  HEENT: Head atraumatic, normocephalic. Oropharynx and nasopharynx clear.  NECK:  Supple, no jugular venous distention. No thyroid enlargement, no tenderness.  LUNGS: Normal breath sounds bilaterally, no wheezing, rales,rhonchi or crepitation. No use of accessory muscles of respiration. Decreased basilar breath sounds CARDIOVASCULAR: S1, S2 normal. No rubs, or gallops. 3/6 systolic murmur present ABDOMEN: Soft, nontender, nondistended. Bowel sounds present. No organomegaly or mass.  EXTREMITIES: No pedal edema, cyanosis, or clubbing.  NEUROLOGIC: Cranial nerves II through XII are intact. Muscle strength 5/5 in all extremities. Sensation intact. Gait not checked.  PSYCHIATRIC: The patient is alert and oriented x 2, some confused conversation. No recall of recent events noted.  SKIN: No obvious rash, lesion, or ulcer.     DATA REVIEW:   CBC Recent Labs  Lab 11/20/17 0547  WBC 14.9*  HGB 13.5  HCT 39.7*  PLT 307    Chemistries  Recent Labs  Lab 11/18/17 0445  11/22/17 0438  NA 135   < > 138  K 3.7   < > 3.3*  CL 96*   < > 99*  CO2 28   < > 29  GLUCOSE 118*   < > 100*  BUN 21*   < > 24*  CREATININE 1.32*   < > 1.28*  CALCIUM 9.3   < > 8.7*  AST 22  --   --   ALT 7*  --   --   ALKPHOS 80  --   --   BILITOT 0.3  --   --    < > = values in this interval not displayed.     Microbiology Results  Results for orders placed or performed during the hospital encounter of 11/18/17   Urine Culture     Status: Abnormal   Collection Time: 11/18/17 11:20 AM  Result Value Ref Range Status   Specimen Description URINE, CLEAN CATCH  Final   Special Requests NONE  Final   Culture >=100,000 COLONIES/mL CITROBACTER FREUNDII (A)  Final   Report Status  11/20/2017 FINAL  Final   Organism ID, Bacteria CITROBACTER FREUNDII (A)  Final      Susceptibility   Citrobacter freundii - MIC*    CEFAZOLIN >=64 RESISTANT Resistant     CEFTRIAXONE <=1 SENSITIVE Sensitive     CIPROFLOXACIN <=0.25 SENSITIVE Sensitive     GENTAMICIN <=1 SENSITIVE Sensitive     IMIPENEM <=0.25 SENSITIVE Sensitive     NITROFURANTOIN <=16 SENSITIVE Sensitive     TRIMETH/SULFA <=20 SENSITIVE Sensitive     PIP/TAZO <=4 SENSITIVE Sensitive     * >=100,000 COLONIES/mL CITROBACTER FREUNDII    RADIOLOGY:  No results found.   Management plans discussed with the patient, family and they are in agreement.  CODE STATUS:     Code Status Orders  (From admission, onward)        Start     Ordered   11/18/17 1246  Do not attempt resuscitation (DNR)  Continuous    Question Answer Comment  In the event of cardiac or respiratory ARREST Do not call a "code blue"   In the event of cardiac or respiratory ARREST Do not perform Intubation, CPR, defibrillation or ACLS   In the event of cardiac or respiratory ARREST Use medication by any route, position, wound care, and other measures to relive pain and suffering. May use oxygen, suction and manual treatment of airway obstruction as needed for comfort.      11/18/17 1245    Code Status History    Date Active Date Inactive Code Status Order ID Comments User Context   11/18/2017 08:54 11/18/2017 12:45 Full Code [id number]  Hillary Bow, MD ED   07/21/2017 13:08 07/28/2017 19:00 DNR [id number]  Max Sane, MD Inpatient   07/20/2017 00:58 07/21/2017 13:08 Full Code [id number]  Lance Coon, MD Inpatient   06/24/2017 22:35 06/30/2017 03:12 Full Code [id number]  Fritzi Mandes, MD  Inpatient    Advance Directive Documentation     Most Recent Value  Type of Advance Directive  Healthcare Power of [place], Out of facility DNR (pink MOST or yellow form)  Pre-existing out of facility DNR order (yellow form or pink MOST form)  Physician notified to receive inpatient order [does not have with him]  "MOST" Form in Place?  No data      TOTAL TIME TAKING CARE OF THIS PATIENT: 38 minutes.    Gladstone Lighter M.D on 11/22/2017 at 2:45 PM  Between 7am to 6pm - Pager - [phone]  After 6pm go to [email or web address] - Proofreader  [facility]  Office  [phone]  CC: Primary care physician; Rusty Aus, MD   Note: This dictation was prepared with Dragon dictation along with smaller phrase technology. Any transcriptional errors that result from this process are unintentional.

## 2017-11-22 NOTE — Plan of Care (Signed)
  Progressing Health Behavior/Discharge Planning: Ability to manage health-related needs will improve 11/22/2017 0417 - Progressing by Denice Bors, RN Clinical Measurements: Ability to maintain clinical measurements within normal limits will improve 11/22/2017 0417 - Progressing by Denice Bors, RN Will remain free from infection 11/22/2017 0417 - Progressing by Denice Bors, RN Diagnostic test results will improve 11/22/2017 0417 - Progressing by Denice Bors, RN Respiratory complications will improve 11/22/2017 0417 - Progressing by Denice Bors, RN Activity: Risk for activity intolerance will decrease 11/22/2017 0417 - Progressing by Denice Bors, RN Nutrition: Adequate nutrition will be maintained 11/22/2017 0417 - Progressing by Denice Bors, RN Coping: Level of anxiety will decrease 11/22/2017 0417 - Progressing by Denice Bors, RN Elimination: Will not experience complications related to bowel motility 11/22/2017 0417 - Progressing by Denice Bors, RN Pain Managment: General experience of comfort will improve 11/22/2017 0417 - Progressing by Denice Bors, RN Safety: Ability to remain free from injury will improve 11/22/2017 0417 - Progressing by Denice Bors, RN Skin Integrity: Risk for impaired skin integrity will decrease 11/22/2017 0417 - Progressing by Denice Bors, RN

## 2017-11-22 NOTE — Progress Notes (Signed)
Report called to [facility] and EMS contacted for transport.  Clarise Cruz, RN

## 2017-11-22 NOTE — Progress Notes (Signed)
Patient is medically stable for D/C to [facility] today. [facility] Medicare SNF authorization has been received. PASARR has been received. Per [facility] admissions coordinator at [facility] patient can come today to room 408. RN will call report and arrange EMS for transport. Clinical Education officer, museum (CSW) sent D/C orders to [facility] via [place]. Patient is aware of above. Patient's son [name] is at bedside and aware of above. Please reconsult if future social work needs arise. CSW signing off.   [employer], LCSW [phone]

## 2017-11-22 NOTE — Clinical Social Work Placement (Signed)
   CLINICAL SOCIAL WORK PLACEMENT  NOTE  Date:  11/22/2017  Patient Details  Name: Ricardo Erickson MRN: [id number] Date of Birth: [birth year]  Clinical Social Work is seeking post-discharge placement for this patient at the [place] level of care (*CSW will initial, date and re-position this form in  chart as items are completed):  Yes   Patient/family provided with [facility] Work Department's list of facilities offering this level of care within the geographic area requested by the patient (or if unable, by the patient's family).  Yes   Patient/family informed of their freedom to choose among providers that offer the needed level of care, that participate in Medicare, Medicaid or managed care program needed by the patient, have an available bed and are willing to accept the patient.  Yes   Patient/family informed of [facility]'s ownership interest in [facility] and [facility], as well as of the fact that they are under no obligation to receive care at these facilities.  PASRR submitted to EDS on 11/21/17     PASRR number received on 11/22/17     Existing PASRR number confirmed on       FL2 transmitted to all facilities in geographic area requested by pt/family on 11/21/17     FL2 transmitted to all facilities within larger geographic area on       Patient informed that his/her managed care company has contracts with or will negotiate with certain facilities, including the following:        Yes   Patient/family informed of bed offers received.  Patient chooses bed at [facility] )     Physician recommends and patient chooses bed at      Patient to be transferred to [facility] ) on 11/22/17.  Patient to be transferred to facility by [facility] )     Patient family notified on 11/22/17 of transfer.  Name of family member notified:  (Patient's son Ricardo Erickson is at bedside and aware of D/C today. )     PHYSICIAN         Additional Comment:    _______________________________________________ Chelsa Stout, Veronia Beets, LCSW 11/22/2017, 3:05 PM

## 2017-11-23 ENCOUNTER — Ambulatory Visit: Payer: Self-pay

## 2017-11-23 DIAGNOSIS — I2699 Other pulmonary embolism without acute cor pulmonale: Secondary | ICD-10-CM | POA: Diagnosis not present

## 2017-11-23 DIAGNOSIS — I5022 Chronic systolic (congestive) heart failure: Secondary | ICD-10-CM | POA: Diagnosis not present

## 2017-11-23 DIAGNOSIS — J449 Chronic obstructive pulmonary disease, unspecified: Secondary | ICD-10-CM | POA: Diagnosis not present

## 2017-11-23 DIAGNOSIS — I480 Paroxysmal atrial fibrillation: Secondary | ICD-10-CM | POA: Diagnosis not present

## 2017-12-04 DIAGNOSIS — G2581 Restless legs syndrome: Secondary | ICD-10-CM | POA: Diagnosis not present

## 2017-12-08 DIAGNOSIS — S2242XD Multiple fractures of ribs, left side, subsequent encounter for fracture with routine healing: Secondary | ICD-10-CM | POA: Diagnosis not present

## 2017-12-08 DIAGNOSIS — I509 Heart failure, unspecified: Secondary | ICD-10-CM | POA: Diagnosis not present

## 2017-12-08 DIAGNOSIS — M549 Dorsalgia, unspecified: Secondary | ICD-10-CM | POA: Diagnosis not present

## 2017-12-08 DIAGNOSIS — I2699 Other pulmonary embolism without acute cor pulmonale: Secondary | ICD-10-CM | POA: Diagnosis not present

## 2017-12-08 DIAGNOSIS — N401 Enlarged prostate with lower urinary tract symptoms: Secondary | ICD-10-CM | POA: Diagnosis not present

## 2017-12-08 DIAGNOSIS — G8929 Other chronic pain: Secondary | ICD-10-CM | POA: Diagnosis not present

## 2017-12-08 DIAGNOSIS — I82491 Acute embolism and thrombosis of other specified deep vein of right lower extremity: Secondary | ICD-10-CM | POA: Diagnosis not present

## 2017-12-08 DIAGNOSIS — F329 Major depressive disorder, single episode, unspecified: Secondary | ICD-10-CM | POA: Diagnosis not present

## 2017-12-08 DIAGNOSIS — R7303 Prediabetes: Secondary | ICD-10-CM | POA: Diagnosis not present

## 2017-12-08 DIAGNOSIS — F419 Anxiety disorder, unspecified: Secondary | ICD-10-CM | POA: Diagnosis not present

## 2017-12-08 DIAGNOSIS — I110 Hypertensive heart disease with heart failure: Secondary | ICD-10-CM | POA: Diagnosis not present

## 2017-12-08 DIAGNOSIS — J439 Emphysema, unspecified: Secondary | ICD-10-CM | POA: Diagnosis not present

## 2017-12-08 DIAGNOSIS — F0390 Unspecified dementia without behavioral disturbance: Secondary | ICD-10-CM | POA: Diagnosis not present

## 2017-12-21 ENCOUNTER — Ambulatory Visit: Payer: Self-pay

## 2017-12-26 DIAGNOSIS — R350 Frequency of micturition: Secondary | ICD-10-CM | POA: Diagnosis not present

## 2018-01-09 DIAGNOSIS — I10 Essential (primary) hypertension: Secondary | ICD-10-CM | POA: Diagnosis not present

## 2018-01-09 DIAGNOSIS — S2242XD Multiple fractures of ribs, left side, subsequent encounter for fracture with routine healing: Secondary | ICD-10-CM | POA: Diagnosis not present

## 2018-01-09 DIAGNOSIS — I2699 Other pulmonary embolism without acute cor pulmonale: Secondary | ICD-10-CM | POA: Diagnosis not present

## 2018-01-09 DIAGNOSIS — I509 Heart failure, unspecified: Secondary | ICD-10-CM | POA: Diagnosis not present

## 2018-01-12 DIAGNOSIS — I2699 Other pulmonary embolism without acute cor pulmonale: Secondary | ICD-10-CM | POA: Diagnosis not present

## 2018-01-12 DIAGNOSIS — D500 Iron deficiency anemia secondary to blood loss (chronic): Secondary | ICD-10-CM | POA: Diagnosis not present

## 2018-01-12 DIAGNOSIS — R972 Elevated prostate specific antigen [PSA]: Secondary | ICD-10-CM | POA: Diagnosis not present

## 2018-01-12 DIAGNOSIS — E538 Deficiency of other specified B group vitamins: Secondary | ICD-10-CM | POA: Diagnosis not present

## 2018-02-02 ENCOUNTER — Encounter: Payer: Self-pay | Admitting: Urology

## 2018-02-02 ENCOUNTER — Ambulatory Visit (INDEPENDENT_AMBULATORY_CARE_PROVIDER_SITE_OTHER): Payer: Medicare Other | Admitting: Urology

## 2018-02-02 VITALS — BP 149/81 | HR 69 | Ht 69.0 in | Wt 210.0 lb

## 2018-02-02 DIAGNOSIS — N138 Other obstructive and reflux uropathy: Secondary | ICD-10-CM | POA: Diagnosis not present

## 2018-02-02 DIAGNOSIS — N401 Enlarged prostate with lower urinary tract symptoms: Secondary | ICD-10-CM

## 2018-02-02 DIAGNOSIS — R3912 Poor urinary stream: Secondary | ICD-10-CM | POA: Diagnosis not present

## 2018-02-02 DIAGNOSIS — R339 Retention of urine, unspecified: Secondary | ICD-10-CM | POA: Diagnosis not present

## 2018-02-02 DIAGNOSIS — R972 Elevated prostate specific antigen [PSA]: Secondary | ICD-10-CM

## 2018-02-02 LAB — URINALYSIS, COMPLETE
Bilirubin, UA: NEGATIVE
GLUCOSE, UA: NEGATIVE
KETONES UA: NEGATIVE
Leukocytes, UA: NEGATIVE
Nitrite, UA: NEGATIVE
PROTEIN UA: NEGATIVE
RBC, UA: NEGATIVE
SPEC GRAV UA: 1.025 (ref 1.005–1.030)
UUROB: 0.2 mg/dL (ref 0.2–1.0)
pH, UA: 5.5 (ref 5.0–7.5)

## 2018-02-02 LAB — MICROSCOPIC EXAMINATION
Epithelial Cells (non renal): NONE SEEN /hpf (ref 0–10)
RBC, UA: NONE SEEN /hpf (ref 0–?)
WBC, UA: NONE SEEN /hpf (ref 0–?)

## 2018-02-02 LAB — BLADDER SCAN AMB NON-IMAGING: Scan Result: 76

## 2018-02-02 MED ORDER — FINASTERIDE 5 MG PO TABS: 5.0000 mg | ORAL_TABLET | Freq: Every day | ORAL | 3 refills | Status: DC

## 2018-02-02 MED ORDER — TAMSULOSIN HCL 0.4 MG PO CAPS: 0.8000 mg | ORAL_CAPSULE | Freq: Every day | ORAL | 3 refills | Status: AC

## 2018-02-02 NOTE — Progress Notes (Signed)
02/02/2018 9:40 AM   Ricardo Erickson 1941/08/25 [id number]  Referring provider: Rusty Aus, MD [facility] [place], [place] [address]  Chief Complaint  Patient presents with  . Follow-up    HPI:  1) BPH and LUTS, retention - . He c/o urgency and UUI. He is on flomax 0.8 mg and finasteride. PVR was 597 ml. Prostate was 115 g on CT Jul 2018. He has a weak stream. This has been going on for a few months. He restarted tamsulosin. NG risk includes back surgery in Jul 2018 with a slow recovery requiring PT. He walks slowly. Finasteride was started in October 2018.  Cystoscopy in November 2018 showed significant lateral lobe hypertrophy with at least 5 cm in length.  He had significant visual obstruction of the high bladder neck due to the significant hypertrophy.  He also has small intravesical lobe.  Currently is not a surgical candidate due to PE in July 2018 requiring Eliquis.  Eliquis has been stopped.  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   2) gross hematuria - pt went to ED 10/01/2017 c/o red urine. No clots. No bac seen but tntc wbc and tntc rbc. Bun 14, cr 1.43. (up from 1.06). He underwent CT 06/24/2017 which showed a normal GU tract and a 115 g prostate with a moderately distended bladder. He is a smoker.Repeat renal ultrasound shows no renal masses however he had bilateral hydronephrosis which is new from prior CT. His circumferential bladder wall thickening and enlarged prostate. Bladder also appears massively distended. Foley was placed and removed.   3) PSA elevation. Associated with 115 g prostate on imaging.  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  4) 10 mm indeterminate right adrenal nodule - on CT Jul 2018. Rad rec CT or MR Jul 2019.   Patient returns in continued management above. His post void residual today is 76 ml. PSA dropped to 6.67 Feb 2019. He has urgency  and UUI. He has frequency. No gross hematuria. He My inly be taking one tamsulosin.   PMH: Past Medical History:  Diagnosis Date  . CHF (congestive heart failure) ([facility])   . Chronic back pain   . Dementia   . Depression   . Emphysema lung ([place])   . Hypertension   . Personal history of tobacco use, presenting hazards to health 08/06/2015  . Pollen allergies   . PTSD (post-traumatic stress disorder)   . Pulmonary embolism [facility])     Surgical History: Past Surgical History:  Procedure Laterality Date  . BACK SURGERY    . KYPHOPLASTY N/A 06/27/2017   Procedure: KYPHOPLASTY -L5;  Surgeon: Hessie Knows, MD;  Location: [facility] ORS;  Service: Orthopedics;  Laterality: N/A;  . TONSILLECTOMY AND ADENOIDECTOMY  1980    Home Medications:  Allergies as of 02/02/2018   No Known Allergies     Medication List        Accurate as of 02/02/18  9:40 AM. Always use your most recent med list.          acetaminophen 650 MG CR tablet Commonly known as:  TYLENOL Take 650 mg by mouth every 8 (eight) hours as needed for pain.   albuterol 108 (90 Base) MCG/ACT inhaler Commonly known as:  PROVENTIL HFA;VENTOLIN HFA Inhale 2 puffs into the lungs as needed for wheezing or shortness of breath.   albuterol (2.5 MG/3ML) 0.083% nebulizer solution  Commonly known as:  PROVENTIL Inhale 2.5 mg into the lungs every 6 (six) hours as needed.   apixaban 5 MG Tabs tablet Commonly known as:  ELIQUIS Take 2 tablets (10 mg total) by mouth 2 (two) times daily for 5 days.   apixaban 5 MG Tabs tablet Commonly known as:  ELIQUIS Take 1 tablet (5 mg total) by mouth 2 (two) times daily.   busPIRone 7.5 MG tablet Commonly known as:  BUSPAR Take 1 tablet twice daily for anxiety.   ciprofloxacin 500 MG tablet Commonly known as:  CIPRO Take 1 tablet (500 mg total) by mouth 2 (two) times daily. X 5 days   docusate sodium 100 MG capsule Commonly known as:  COLACE Take 1 capsule (100 mg total) by mouth 2 (two)  times daily.   feeding supplement (ENSURE ENLIVE) Liqd Take 237 mLs by mouth 3 (three) times daily between meals.   finasteride 5 MG tablet Commonly known as:  PROSCAR Take 1 tablet (5 mg total) by mouth daily.   lidocaine 5 % Commonly known as:  LIDODERM Place 1 patch onto the skin daily. Remove & Discard patch within 12 hours or as directed by MD   metoprolol succinate 50 MG 24 hr tablet Commonly known as:  TOPROL-XL Take 50 mg by mouth daily. Take with or immediately following a meal.   oxyCODONE 5 MG immediate release tablet Commonly known as:  ROXICODONE Take 1-2 tablets (5-10 mg total) by mouth every 6 (six) hours as needed for moderate pain or severe pain.   polyethylene glycol packet Commonly known as:  MIRALAX / GLYCOLAX Take 17 g by mouth daily as needed for mild constipation.   pramipexole 1 MG tablet Commonly known as:  MIRAPEX Take 1 tablet (1 mg total) by mouth at bedtime.   QUEtiapine 25 MG tablet Commonly known as:  SEROQUEL Take 1 tablet (25 mg total) by mouth at bedtime.   tamsulosin 0.4 MG Caps capsule Commonly known as:  FLOMAX Take 0.4 mg by mouth daily after supper.       Allergies: No Known Allergies  Family History: Family History  Problem Relation Age of Onset  . Alcohol abuse Father   . Arthritis Mother   . Heart disease Maternal Grandmother   . Prostate cancer Brother     Social History:  reports that he has been smoking cigarettes.  He has a 60.00 pack-year smoking history. he has never used smokeless tobacco. He reports that he does not drink alcohol or use drugs.  ROS:                                        Physical Exam: BP (!) 149/81   Pulse 69   Ht 5\' 9"  (1.753 m)   Wt 95.3 kg (210 lb)   BMI 31.01 kg/m   Constitutional:  Alert and oriented, No acute distress. Elderly in wheelchair.  HEENT: [place] AT, moist mucus membranes.  Trachea midline, no masses. Cardiovascular: No clubbing, cyanosis, or  edema. Respiratory: Normal respiratory effort, no increased work of breathing. GI: Abdomen is soft, nontender, nondistended, no abdominal masses GU: No CVA tenderness. Skin: No rashes, bruises or suspicious lesions. Neurologic: Grossly intact, no focal deficits, moving all 4 extremities. Psychiatric: Normal mood and affect.  Laboratory Data: Lab Results  Component Value Date   WBC 14.9 (H) 11/20/2017   HGB 13.5 11/20/2017   HCT  39.7 (L) 11/20/2017   MCV 90.9 11/20/2017   PLT 307 11/20/2017    Lab Results  Component Value Date   CREATININE 1.28 (H) 11/22/2017    No results found for: PSA1  No results found for: TESTOSTERONE  Lab Results  Component Value Date   HGBA1C 6.0 01/18/2017    Urinalysis Lab Results  Component Value Date   SPECGRAV 1.015 10/26/2017   PHUR 6.0 10/26/2017   COLORU Yellow 10/26/2017   APPEARANCEUR TURBID (A) 11/18/2017   LEUKOCYTESUR SMALL (A) 11/18/2017   PROTEINUR 100 (A) 11/18/2017   GLUCOSEU NEGATIVE 11/18/2017   KETONESU Negative 10/26/2017   RBCU TOO NUMEROUS TO COUNT 11/18/2017   BILIRUBINUR NEGATIVE 11/18/2017   UUROB 0.2 10/26/2017   NITRITE POSITIVE (A) 11/18/2017    Lab Results  Component Value Date   BACTERIA MANY (A) 11/18/2017     No results found for this or any previous visit. Results for orders placed during the hospital encounter of 11/18/17  US Venous Img Lower Bilateral   Narrative CLINICAL DATA:  Pulmonary emboli  EXAM: BILATERAL LOWER EXTREMITY VENOUS DOPPLER ULTRASOUND  TECHNIQUE: Gray-scale sonography with compression, as well as color and duplex ultrasound, were performed to evaluate the deep venous system from the level of the common femoral vein through the popliteal and proximal calf veins.  COMPARISON:  None  FINDINGS: On the left, Normal compressibility of the common femoral, superficial femoral, and popliteal veins, as well as the proximal calf veins. No filling defects to suggest DVT on  grayscale or color Doppler imaging. Doppler waveforms show normal direction of venous flow, normal respiratory phasicity and response to augmentation. Visualized segments of the saphenous venous system normal in caliber and compressibility.  On the right, the common femoral, deep femoral, femoral, and popliteal veins are unremarkable. The peroneal vein is noncompressible, with no color Doppler signal to indicate flow.  IMPRESSION: 1. Isolated Right peroneal occlusive DVT. 2. Negative for left lower extremity DVT. No evidence of  lower extremity deep vein thrombosis.   Electronically Signed   By: Lucrezia Europe M.D.   On: 11/18/2017 10:25    No results found for this or any previous visit. No results found for this or any previous visit. Results for orders placed during the hospital encounter of 10/24/17  US RENAL   Narrative CLINICAL DATA:  77 year old male with gross hematuria. Initial encounter.  EXAM: RENAL / URINARY TRACT ULTRASOUND COMPLETE  COMPARISON:  06/24/2017 CT.  FINDINGS: Right Kidney:  Length: 13.2 cm.  Moderate hydronephrosis.  No mass identified.  Left Kidney:  Length: 13.3 cm.  Moderate hydronephrosis.  No mass identified.  Bladder:  Enlarged prostate gland impresses upon the bladder base. Mild circumferential bladder wall thickening. Bilateral ureteral jets are noted.  IMPRESSION: Moderate bilateral hydronephrosis new from prior CT and of indeterminate etiology.  Enlarged prostate gland impresses upon the bladder base. Clinical and laboratory correlation recommended.  Mild circumferential bladder wall thickening. Bilateral ureteral jets are noted.  These results will be called to the ordering clinician or representative by the Radiologist Assistant, and communication documented in the PACS or zVision Dashboard.   Electronically Signed   By: Genia Del M.D.   On: 10/24/2017 13:50    No results found for this or any previous  visit. No results found for this or any previous visit. No results found for this or any previous visit.  Assessment & Plan:   1. Bph, luts - doing well on finasteride and tamsulosin. He was  only taking tamsulosin 0.4 mg. I increased to 0.8 mg. We also discussed timed voiding. He asked about procedures and we briefly discussed TURP vs HoLEP.   - Urinalysis, Complete - Bladder Scan (Post Void Residual) in office  2. PSA elevation -- PSA dropped appropriately and stable compared to prior.    No Follow-up on file.  Festus Aloe, MD  [facility] [address] [place], [place] [address] [phone]

## 2018-03-07 ENCOUNTER — Other Ambulatory Visit: Payer: Self-pay

## 2018-03-07 ENCOUNTER — Emergency Department: Payer: Medicare Other

## 2018-03-07 ENCOUNTER — Emergency Department
Admission: EM | Admit: 2018-03-07 | Discharge: 2018-03-07 | Disposition: A | Discharge status: Home / Self Care | Payer: Medicare Other | Attending: Emergency Medicine | Admitting: Emergency Medicine

## 2018-03-07 ENCOUNTER — Encounter: Payer: Self-pay | Admitting: Emergency Medicine

## 2018-03-07 DIAGNOSIS — Y999 Unspecified external cause status: Secondary | ICD-10-CM | POA: Diagnosis not present

## 2018-03-07 DIAGNOSIS — Z7901 Long term (current) use of anticoagulants: Secondary | ICD-10-CM | POA: Insufficient documentation

## 2018-03-07 DIAGNOSIS — X58XXXA Exposure to other specified factors, initial encounter: Secondary | ICD-10-CM | POA: Diagnosis not present

## 2018-03-07 DIAGNOSIS — S39012A Strain of muscle, fascia and tendon of lower back, initial encounter: Secondary | ICD-10-CM | POA: Diagnosis not present

## 2018-03-07 DIAGNOSIS — Y929 Unspecified place or not applicable: Secondary | ICD-10-CM | POA: Insufficient documentation

## 2018-03-07 DIAGNOSIS — I509 Heart failure, unspecified: Secondary | ICD-10-CM | POA: Insufficient documentation

## 2018-03-07 DIAGNOSIS — F0390 Unspecified dementia without behavioral disturbance: Secondary | ICD-10-CM | POA: Diagnosis not present

## 2018-03-07 DIAGNOSIS — M545 Low back pain: Secondary | ICD-10-CM | POA: Diagnosis not present

## 2018-03-07 DIAGNOSIS — Z79899 Other long term (current) drug therapy: Secondary | ICD-10-CM | POA: Insufficient documentation

## 2018-03-07 DIAGNOSIS — Y9301 Activity, walking, marching and hiking: Secondary | ICD-10-CM | POA: Diagnosis not present

## 2018-03-07 DIAGNOSIS — F17210 Nicotine dependence, cigarettes, uncomplicated: Secondary | ICD-10-CM | POA: Insufficient documentation

## 2018-03-07 DIAGNOSIS — I110 Hypertensive heart disease with heart failure: Secondary | ICD-10-CM | POA: Diagnosis not present

## 2018-03-07 DIAGNOSIS — J449 Chronic obstructive pulmonary disease, unspecified: Secondary | ICD-10-CM | POA: Insufficient documentation

## 2018-03-07 DIAGNOSIS — S3992XA Unspecified injury of lower back, initial encounter: Secondary | ICD-10-CM | POA: Diagnosis present

## 2018-03-07 DIAGNOSIS — M549 Dorsalgia, unspecified: Secondary | ICD-10-CM | POA: Diagnosis not present

## 2018-03-07 MED ORDER — TRAMADOL HCL 50 MG PO TABS
50.0000 mg | ORAL_TABLET | Freq: Once | ORAL | Status: AC
  Administered 2018-03-07: 50 mg via ORAL
  Filled 2018-03-07: qty 1

## 2018-03-07 MED ORDER — TRAMADOL HCL 50 MG PO TABS: 50.0000 mg | ORAL_TABLET | Freq: Four times a day (QID) | ORAL | 0 refills | Status: DC | PRN

## 2018-03-07 NOTE — ED Notes (Signed)
Pt family at bedside visiting with pt

## 2018-03-07 NOTE — ED Triage Notes (Signed)
Pt to ED via [facility] from home for back spasms that started last night. Pt states that it is causing him trouble walking and he almost fell last night but he caught himself. Pt in NAD at this time.

## 2018-03-07 NOTE — ED Provider Notes (Signed)
[facility] Emergency Department Provider Note ____________________________________________   I have reviewed the triage vital signs and the triage nursing note.  HISTORY  Chief Complaint Back Pain   Historian Patient  HPI Ricardo Erickson is a 77 y.o. male reports history of chronic low back pain secondary to compression fracture status post kyphoplasty several months ago, walks with a walker and/or cane.  States he is walking with a cane most of the day yesterday and then he actually had a step where he felt a pop and a severe pain in his low back.  Patient thinks he could have pulled a muscle, but is also concerned about an additional fracture especially since his brother did this and had a second fracture.  No weakness or numbness.  No abdominal pain.  Pain is more severe and worse with movement.     Past Medical History:  Diagnosis Date  . CHF (congestive heart failure) ([place])   . Chronic back pain   . Dementia   . Depression   . Emphysema lung ([place])   . Hypertension   . Personal history of tobacco use, presenting hazards to health 08/06/2015  . Pollen allergies   . PTSD (post-traumatic stress disorder)   . Pulmonary embolism [facility])     Patient Active Problem List   Diagnosis Date Noted  . Pulmonary emboli ([place]) 11/18/2017  . Tobacco use disorder 07/24/2017  . Encephalopathy   . Palliative care encounter   . Goals of care, counseling/discussion   . Pulmonary embolus ([place])   . UTI (urinary tract infection) 07/19/2017  . PTSD (post-traumatic stress disorder) 06/28/2017  . Adjustment disorder with anxiety 06/28/2017  . Closed compression fracture of L5 lumbar vertebra ([place])   . Left low back pain   . Weakness   . A-fib ([place]) 06/24/2017  . Prediabetes 01/18/2017  . Prostate cancer ([place]) 01/18/2017  . Anxiety 01/18/2017  . Preventative health care 04/15/2016  . Restless leg syndrome 04/03/2016  . COPD (chronic obstructive pulmonary  disease) ([place]) 04/03/2016  . BPH (benign prostatic hyperplasia) 04/03/2016  . Personal history of tobacco use, presenting hazards to health 08/06/2015    Past Surgical History:  Procedure Laterality Date  . BACK SURGERY    . KYPHOPLASTY N/A 06/27/2017   Procedure: KYPHOPLASTY -L5;  Surgeon: Hessie Knows, MD;  Location: [facility] ORS;  Service: Orthopedics;  Laterality: N/A;  . [place]    Prior to Admission medications   Medication Sig Start Date End Date Taking? Authorizing Provider  acetaminophen (TYLENOL) 650 MG CR tablet Take 650 mg by mouth every 8 (eight) hours as needed for pain.    [provider]  albuterol (PROVENTIL HFA;VENTOLIN HFA) 108 (90 Base) MCG/ACT inhaler Inhale 2 puffs into the lungs as needed for wheezing or shortness of breath.    [provider]  albuterol (PROVENTIL) (2.5 MG/3ML) 0.083% nebulizer solution Inhale 2.5 mg into the lungs every 6 (six) hours as needed.  01/15/15   [provider]  apixaban (ELIQUIS) 5 MG TABS tablet Take 1 tablet (5 mg total) by mouth 2 (two) times daily. 11/28/17   Gladstone Lighter, MD  busPIRone (BUSPAR) 7.5 MG tablet Take 1 tablet twice daily for anxiety. Patient taking differently: Take 7.5 mg by mouth daily. Take 1 tablet twice daily for anxiety. 06/19/17   Pleas Koch, NP  ciprofloxacin (CIPRO) 500 MG tablet Take 1 tablet (500 mg total) by mouth 2 (two) times daily. X 5 days 11/22/17  Gladstone Lighter, MD  docusate sodium (COLACE) 100 MG capsule Take 1 capsule (100 mg total) by mouth 2 (two) times daily. 11/22/17   Gladstone Lighter, MD  feeding supplement, ENSURE ENLIVE, (ENSURE ENLIVE) LIQD Take 237 mLs by mouth 3 (three) times daily between meals. 11/22/17   Gladstone Lighter, MD  finasteride (PROSCAR) 5 MG tablet Take 1 tablet (5 mg total) by mouth daily. 02/02/18   Festus Aloe, MD  lidocaine (LIDODERM) 5 % Place 1 patch onto the skin daily. Remove & Discard patch  within 12 hours or as directed by MD 07/27/17   Henreitta Leber, MD  metoprolol succinate (TOPROL-XL) 50 MG 24 hr tablet Take 50 mg by mouth daily. Take with or immediately following a meal.    [provider]  oxyCODONE (ROXICODONE) 5 MG immediate release tablet Take 1-2 tablets (5-10 mg total) by mouth every 6 (six) hours as needed for moderate pain or severe pain. 11/22/17   Gladstone Lighter, MD  polyethylene glycol (MIRALAX / GLYCOLAX) packet Take 17 g by mouth daily as needed for mild constipation. 11/22/17   Gladstone Lighter, MD  pramipexole (MIRAPEX) 1 MG tablet Take 1 tablet (1 mg total) by mouth at bedtime. 11/22/17   Gladstone Lighter, MD  QUEtiapine (SEROQUEL) 25 MG tablet Take 1 tablet (25 mg total) by mouth at bedtime. 07/27/17   Henreitta Leber, MD  tamsulosin (FLOMAX) 0.4 MG CAPS capsule Take 2 capsules (0.8 mg total) by mouth daily after supper. 02/02/18   Festus Aloe, MD  traMADol (ULTRAM) 50 MG tablet Take 1 tablet (50 mg total) by mouth every 6 (six) hours as needed. 03/07/18   Lisa Roca, MD    No Known Allergies  Family History  Problem Relation Age of Onset  . Alcohol abuse Father   . Arthritis Mother   . Heart disease Maternal Grandmother   . Prostate cancer Brother     Social History Social History   Tobacco Use  . Smoking status: Current Every Day Smoker    Packs/day: 1.00    Years: 60.00    Pack years: 60.00    Types: Cigarettes  . Smokeless tobacco: Never Used  Substance Use Topics  . Alcohol use: No    Alcohol/week: 0.0 oz  . Drug use: No    Review of Systems  Constitutional: Negative for fever. Eyes: Negative for visual changes. ENT: Negative for sore throat. Cardiovascular: Negative for chest pain. Respiratory: Negative for shortness of breath. Gastrointestinal: Negative for abdominal pain, vomiting and diarrhea. Genitourinary: Negative for dysuria. Musculoskeletal: Positive as per HPI for back pain. Skin: Negative for  rash. Neurological: Negative for headache.  ____________________________________________   PHYSICAL EXAM:  VITAL SIGNS: ED Triage Vitals  Enc Vitals Group     BP 03/07/18 1154 138/84     Pulse Rate 03/07/18 1154 (!) 113     Resp 03/07/18 1154 16     Temp 03/07/18 1154 98.1 F (36.7 C)     Temp Source 03/07/18 1154 Axillary     SpO2 03/07/18 1154 97 %     Weight --      Height --      Head Circumference --      Peak Flow --      Pain Score 03/07/18 1155 8     Pain Loc --      Pain Edu? --      Excl. in [place]? --      Constitutional: Alert and oriented. Well appearing and in  no distress. HEENT   Head: Normocephalic and atraumatic.      Eyes: Conjunctivae are normal. Pupils equal and round.       Ears:         Nose: No congestion/rhinnorhea.   Mouth/Throat: Mucous membranes are moist.   Neck: No stridor. Cardiovascular/Chest: Normal rate, regular rhythm.  No murmurs, rubs, or gallops. Respiratory: Normal respiratory effort without tachypnea nor retractions. Breath sounds are clear and equal bilaterally. No wheezes/rales/rhonchi. Gastrointestinal: Soft. No distention, no guarding, no rebound. Nontender.    Genitourinary/rectal:Deferred Musculoskeletal: Bilateral low back pain in the lumbar area palpation without step-off.  Nontender with normal range of motion in all extremities. No joint effusions.  No lower extremity tenderness.  No edema. Neurologic:  Normal speech and language. No gross or focal neurologic deficits are appreciated. Skin:  Skin is warm, dry and intact. No rash noted. Psychiatric: Mood and affect are normal. Speech and behavior are normal. Patient exhibits appropriate insight and judgment.   ____________________________________________  LABS (pertinent positives/negatives) I, Lisa Roca, MD the attending physician have reviewed the labs noted below.  Labs Reviewed - No data to  display   ____________________________________________  RADIOLOGY   DG lumbar spine reviewed and interpreted by me: no obvious new compression fracture  Radiologist interpretation:  IMPRESSION: Prior L5 kyphoplasty.  Questionable height loss at L1. This may be chronic in nature but appears worse than that seen on prior CT examination. Correlation to point tenderness is recommended. Nonemergent MRI may be helpful for further evaluation. __________________________________________  PROCEDURES  Procedure(s) performed: None  Procedures  Critical Care performed: None   ____________________________________________  ED COURSE / ASSESSMENT AND PLAN  Pertinent labs & imaging results that were available during my care of the patient were reviewed by me and considered in my medical decision making (see chart for details).   Patient has chronic low back pain and took a step and heard a pop, will image, however may be muscular in etiology.  Radiologist interpretation questionable height loss at L1.  Patient seems to be having pain further down the neck, however I discussed with him the possibility of this being a new compression fracture.  Most likely it seems like his pain is probably coming from more of a muscle spasm or muscle pull.  Discussed symptomatic treatment with anti-inflammatory as well as pain medication.  And he has seen a spine surgeon in the past, he can follow-up with a spine surgeon and/or his primary care doctor with conservative management here over the next week.  Symptoms do not seem consistent with kidney stone or intra-abdominal or retroperitoneal emergency.     Patient / Family / Caregiver informed of clinical course, medical decision-making process, and agree with plan.   I discussed return precautions, follow-up instructions, and discharge instructions with patient and/or family.  Discharge Instructions : You are evaluated for low back pain, and your  x-rays are overall reassuring.  There is an indication about the possibility of L1 having a minor compression, versus this being muscular pain.  You may take over-the-counter Tylenol and/or ibuprofen, use as directed on labeling.  For more severe pain you may take 1-2 tablets of the tramadol every 6 hours as needed.  Return to the emergency department immediately for any worsening condition including uncontrolled pain, numbness or tingling of the legs, any abdominal pain, fever, black or bloody stool, or any pooping or peeing on yourself (incontinence).    ___________________________________________   FINAL CLINICAL IMPRESSION(S) / ED DIAGNOSES  Final diagnoses:  Strain of lumbar region, initial encounter      ___________________________________________        Note: This dictation was prepared with Dragon dictation. Any transcriptional errors that result from this process are unintentional    Lisa Roca, MD 03/07/18 1437

## 2018-03-07 NOTE — Discharge Instructions (Addendum)
You are evaluated for low back pain, and your x-rays are overall reassuring.  There is an indication about the possibility of L1 having a minor compression, versus this being muscular pain.  You may take over-the-counter Tylenol and/or ibuprofen, use as directed on labeling.  For more severe pain you may take 1-2 tablets of the tramadol every 6 hours as needed.  Return to the emergency department immediately for any worsening condition including uncontrolled pain, numbness or tingling of the legs, any abdominal pain, fever, black or bloody stool, or any pooping or peeing on yourself (incontinence).

## 2018-04-06 DIAGNOSIS — M545 Low back pain: Secondary | ICD-10-CM | POA: Diagnosis not present

## 2018-04-06 DIAGNOSIS — I2699 Other pulmonary embolism without acute cor pulmonale: Secondary | ICD-10-CM | POA: Diagnosis not present

## 2018-04-06 DIAGNOSIS — G9349 Other encephalopathy: Secondary | ICD-10-CM | POA: Diagnosis not present

## 2018-04-06 DIAGNOSIS — E538 Deficiency of other specified B group vitamins: Secondary | ICD-10-CM | POA: Diagnosis not present

## 2018-04-06 DIAGNOSIS — M5116 Intervertebral disc disorders with radiculopathy, lumbar region: Secondary | ICD-10-CM | POA: Diagnosis not present

## 2018-04-09 DIAGNOSIS — G9349 Other encephalopathy: Secondary | ICD-10-CM | POA: Diagnosis not present

## 2018-04-09 DIAGNOSIS — I2699 Other pulmonary embolism without acute cor pulmonale: Secondary | ICD-10-CM | POA: Diagnosis not present

## 2018-04-09 DIAGNOSIS — M5116 Intervertebral disc disorders with radiculopathy, lumbar region: Secondary | ICD-10-CM | POA: Diagnosis not present

## 2018-04-09 DIAGNOSIS — E538 Deficiency of other specified B group vitamins: Secondary | ICD-10-CM | POA: Diagnosis not present

## 2018-04-16 DIAGNOSIS — I2699 Other pulmonary embolism without acute cor pulmonale: Secondary | ICD-10-CM | POA: Diagnosis not present

## 2018-04-16 DIAGNOSIS — I509 Heart failure, unspecified: Secondary | ICD-10-CM | POA: Diagnosis not present

## 2018-04-16 DIAGNOSIS — S2242XD Multiple fractures of ribs, left side, subsequent encounter for fracture with routine healing: Secondary | ICD-10-CM | POA: Diagnosis not present

## 2018-04-16 DIAGNOSIS — I10 Essential (primary) hypertension: Secondary | ICD-10-CM | POA: Diagnosis not present

## 2018-04-30 ENCOUNTER — Telehealth: Payer: Self-pay | Admitting: *Deleted

## 2018-04-30 DIAGNOSIS — J449 Chronic obstructive pulmonary disease, unspecified: Secondary | ICD-10-CM | POA: Diagnosis not present

## 2018-04-30 DIAGNOSIS — R918 Other nonspecific abnormal finding of lung field: Secondary | ICD-10-CM | POA: Diagnosis not present

## 2018-04-30 DIAGNOSIS — R0602 Shortness of breath: Secondary | ICD-10-CM | POA: Diagnosis not present

## 2018-04-30 NOTE — Telephone Encounter (Signed)
Left message for patient to notify them that it is time to schedule annual low dose lung cancer screening CT scan. Instructed patient to call back to verify information prior to the scan being scheduled.  

## 2018-05-15 ENCOUNTER — Telehealth: Payer: Self-pay | Admitting: *Deleted

## 2018-05-15 NOTE — Telephone Encounter (Signed)
Contact son as requested regarding the recommended annual lung screening scan. Discussed recommendation and plan to contact son in approximately 1 month to attempt to coordinate with another appt.

## 2018-05-24 DIAGNOSIS — S32010A Wedge compression fracture of first lumbar vertebra, initial encounter for closed fracture: Secondary | ICD-10-CM | POA: Diagnosis not present

## 2018-06-19 DIAGNOSIS — R05 Cough: Secondary | ICD-10-CM | POA: Diagnosis not present

## 2018-06-19 DIAGNOSIS — T464X5A Adverse effect of angiotensin-converting-enzyme inhibitors, initial encounter: Secondary | ICD-10-CM | POA: Diagnosis not present

## 2018-06-19 DIAGNOSIS — J449 Chronic obstructive pulmonary disease, unspecified: Secondary | ICD-10-CM | POA: Diagnosis not present

## 2018-06-19 DIAGNOSIS — R918 Other nonspecific abnormal finding of lung field: Secondary | ICD-10-CM | POA: Diagnosis not present

## 2018-06-22 DIAGNOSIS — S32010A Wedge compression fracture of first lumbar vertebra, initial encounter for closed fracture: Secondary | ICD-10-CM | POA: Diagnosis not present

## 2018-06-22 DIAGNOSIS — J449 Chronic obstructive pulmonary disease, unspecified: Secondary | ICD-10-CM | POA: Diagnosis not present

## 2018-06-22 DIAGNOSIS — I5022 Chronic systolic (congestive) heart failure: Secondary | ICD-10-CM | POA: Diagnosis not present

## 2018-06-22 DIAGNOSIS — G4710 Hypersomnia, unspecified: Secondary | ICD-10-CM | POA: Diagnosis not present

## 2018-07-02 DIAGNOSIS — F05 Delirium due to known physiological condition: Secondary | ICD-10-CM | POA: Diagnosis not present

## 2018-07-03 ENCOUNTER — Emergency Department: Payer: Medicare Other

## 2018-07-03 ENCOUNTER — Emergency Department
Admission: EM | Admit: 2018-07-03 | Discharge: 2018-07-03 | Disposition: A | Discharge status: Home / Self Care | Payer: Medicare Other | Attending: Emergency Medicine | Admitting: Emergency Medicine

## 2018-07-03 ENCOUNTER — Other Ambulatory Visit: Payer: Self-pay

## 2018-07-03 DIAGNOSIS — Z79899 Other long term (current) drug therapy: Secondary | ICD-10-CM | POA: Insufficient documentation

## 2018-07-03 DIAGNOSIS — J449 Chronic obstructive pulmonary disease, unspecified: Secondary | ICD-10-CM | POA: Diagnosis not present

## 2018-07-03 DIAGNOSIS — R7303 Prediabetes: Secondary | ICD-10-CM | POA: Insufficient documentation

## 2018-07-03 DIAGNOSIS — Z7901 Long term (current) use of anticoagulants: Secondary | ICD-10-CM | POA: Diagnosis not present

## 2018-07-03 DIAGNOSIS — I4510 Unspecified right bundle-branch block: Secondary | ICD-10-CM | POA: Diagnosis not present

## 2018-07-03 DIAGNOSIS — R410 Disorientation, unspecified: Secondary | ICD-10-CM | POA: Insufficient documentation

## 2018-07-03 DIAGNOSIS — I110 Hypertensive heart disease with heart failure: Secondary | ICD-10-CM | POA: Insufficient documentation

## 2018-07-03 DIAGNOSIS — I509 Heart failure, unspecified: Secondary | ICD-10-CM | POA: Diagnosis not present

## 2018-07-03 DIAGNOSIS — R0602 Shortness of breath: Secondary | ICD-10-CM | POA: Diagnosis not present

## 2018-07-03 DIAGNOSIS — R600 Localized edema: Secondary | ICD-10-CM | POA: Insufficient documentation

## 2018-07-03 DIAGNOSIS — R4182 Altered mental status, unspecified: Secondary | ICD-10-CM | POA: Diagnosis present

## 2018-07-03 DIAGNOSIS — Z86711 Personal history of pulmonary embolism: Secondary | ICD-10-CM | POA: Diagnosis not present

## 2018-07-03 DIAGNOSIS — F17210 Nicotine dependence, cigarettes, uncomplicated: Secondary | ICD-10-CM | POA: Diagnosis not present

## 2018-07-03 DIAGNOSIS — F0390 Unspecified dementia without behavioral disturbance: Secondary | ICD-10-CM | POA: Insufficient documentation

## 2018-07-03 DIAGNOSIS — Z8546 Personal history of malignant neoplasm of prostate: Secondary | ICD-10-CM | POA: Diagnosis not present

## 2018-07-03 DIAGNOSIS — J9811 Atelectasis: Secondary | ICD-10-CM | POA: Diagnosis not present

## 2018-07-03 LAB — URINALYSIS, COMPLETE (UACMP) WITH MICROSCOPIC
BILIRUBIN URINE: NEGATIVE
Bacteria, UA: NONE SEEN
GLUCOSE, UA: NEGATIVE mg/dL
Ketones, ur: NEGATIVE mg/dL
Leukocytes, UA: NEGATIVE
NITRITE: NEGATIVE
PH: 7 (ref 5.0–8.0)
Protein, ur: NEGATIVE mg/dL
SPECIFIC GRAVITY, URINE: 1.016 (ref 1.005–1.030)

## 2018-07-03 LAB — BRAIN NATRIURETIC PEPTIDE: B Natriuretic Peptide: 102 pg/mL — ABNORMAL HIGH (ref 0.0–100.0)

## 2018-07-03 LAB — CBC
HEMATOCRIT: 42 % (ref 40.0–52.0)
Hemoglobin: 14.3 g/dL (ref 13.0–18.0)
MCH: 30.6 pg (ref 26.0–34.0)
MCHC: 34 g/dL (ref 32.0–36.0)
MCV: 89.9 fL (ref 80.0–100.0)
Platelets: 253 10*3/uL (ref 150–440)
RBC: 4.67 MIL/uL (ref 4.40–5.90)
RDW: 15.4 % — AB (ref 11.5–14.5)
WBC: 11.8 10*3/uL — AB (ref 3.8–10.6)

## 2018-07-03 LAB — COMPREHENSIVE METABOLIC PANEL
ALT: 9 U/L (ref 0–44)
AST: 29 U/L (ref 15–41)
Albumin: 3.5 g/dL (ref 3.5–5.0)
Alkaline Phosphatase: 96 U/L (ref 38–126)
Anion gap: 7 (ref 5–15)
BUN: 10 mg/dL (ref 8–23)
CO2: 30 mmol/L (ref 22–32)
Calcium: 9 mg/dL (ref 8.9–10.3)
Chloride: 105 mmol/L (ref 98–111)
Creatinine, Ser: 0.9 mg/dL (ref 0.61–1.24)
GFR calc Af Amer: >60 mL/min (ref >60)
GFR calc non Af Amer: >60 mL/min (ref >60)
Glucose, Bld: 108 mg/dL — ABNORMAL HIGH (ref 70–99)
Potassium: 3.1 mmol/L — ABNORMAL LOW (ref 3.5–5.1)
SODIUM: 142 mmol/L (ref 135–145)
Total Bilirubin: 0.9 mg/dL (ref 0.3–1.2)
Total Protein: 6.6 g/dL (ref 6.5–8.1)

## 2018-07-03 LAB — PROTIME-INR
INR: 1
Prothrombin Time: 13.1 seconds (ref 11.4–15.2)

## 2018-07-03 LAB — TROPONIN I: Troponin I: <0.03 ng/mL (ref <0.03)

## 2018-07-03 MED ORDER — FUROSEMIDE 10 MG/ML IJ SOLN
20.0000 mg | Freq: Once | INTRAMUSCULAR | Status: AC
  Administered 2018-07-03: 20 mg via INTRAVENOUS
  Filled 2018-07-03: qty 4

## 2018-07-03 MED ORDER — IPRATROPIUM-ALBUTEROL 0.5-2.5 (3) MG/3ML IN SOLN
3.0000 mL | Freq: Once | RESPIRATORY_TRACT | Status: AC
  Administered 2018-07-03: 3 mL via RESPIRATORY_TRACT
  Filled 2018-07-03: qty 3

## 2018-07-03 MED ORDER — LORAZEPAM 0.5 MG PO TABS
0.5000 mg | ORAL_TABLET | Freq: Once | ORAL | Status: AC
  Administered 2018-07-03: 0.5 mg via ORAL
  Filled 2018-07-03: qty 1

## 2018-07-03 NOTE — Discharge Instructions (Addendum)
Please follow up with your primary care physician for further evaluation of your symptoms. Your symptoms may reflect the beginnings of dementia. Please discuss your symptoms with your physician.

## 2018-07-03 NOTE — ED Provider Notes (Signed)
[facility] Emergency Department Provider Note   ____________________________________________   First MD Initiated Contact with Patient 07/03/18 [phone]     (approximate)  I have reviewed the triage vital signs and the nursing notes.   HISTORY  Chief Complaint Altered Mental Status    HPI Ricardo Erickson is a 77 y.o. male who comes into the hospital today with some confusion.  The patient states that he broke his back a while back and that he had a heart attack on the table.  He reports that his back hurts so bad that he supposed to find out about his condition today.  He woke up today and states that he did not know where he was.  He states that it was very scary.  He knows that he moved to his new facility recently but he states that it was very disconcerting to not know where he was.  The patient hit his button and sat by the door and waited for EMS to arrive.  He denies any pain currently in his back.  He states that he has had some ankle swelling and has felt some shortness of breath but he does not have any chest pains.  The patient states that it was very scary that he did not know where he was so he came into the hospital for evaluation.   Past Medical History:  Diagnosis Date  . CHF (congestive heart failure) ([place])   . Chronic back pain   . Dementia   . Depression   . Emphysema lung ([place])   . Hypertension   . Personal history of tobacco use, presenting hazards to health 08/06/2015  . Pollen allergies   . PTSD (post-traumatic stress disorder)   . Pulmonary embolism [facility])     Patient Active Problem List   Diagnosis Date Noted  . Pulmonary emboli ([place]) 11/18/2017  . Tobacco use disorder 07/24/2017  . Encephalopathy   . Palliative care encounter   . Goals of care, counseling/discussion   . Pulmonary embolus ([place])   . UTI (urinary tract infection) 07/19/2017  . PTSD (post-traumatic stress disorder) 06/28/2017  . Adjustment disorder with anxiety  06/28/2017  . Closed compression fracture of L5 lumbar vertebra   . Left low back pain   . Weakness   . A-fib ([place]) 06/24/2017  . Prediabetes 01/18/2017  . Prostate cancer ([place]) 01/18/2017  . Anxiety 01/18/2017  . Preventative health care 04/15/2016  . Restless leg syndrome 04/03/2016  . COPD (chronic obstructive pulmonary disease) ([place]) 04/03/2016  . BPH (benign prostatic hyperplasia) 04/03/2016  . Personal history of tobacco use, presenting hazards to health 08/06/2015    Past Surgical History:  Procedure Laterality Date  . BACK SURGERY    . KYPHOPLASTY N/A 06/27/2017   Procedure: KYPHOPLASTY -L5;  Surgeon: Hessie Knows, MD;  Location: [facility] ORS;  Service: Orthopedics;  Laterality: N/A;  . [place]    Prior to Admission medications   Medication Sig Start Date End Date Taking? Authorizing Provider  acetaminophen (TYLENOL) 650 MG CR tablet Take 650 mg by mouth every 8 (eight) hours as needed for pain.    [provider]  albuterol (PROVENTIL HFA;VENTOLIN HFA) 108 (90 Base) MCG/ACT inhaler Inhale 2 puffs into the lungs as needed for wheezing or shortness of breath.    [provider]  albuterol (PROVENTIL) (2.5 MG/3ML) 0.083% nebulizer solution Inhale 2.5 mg into the lungs every 6 (six) hours as needed.  01/15/15   [provider]  apixaban (ELIQUIS) 5 MG TABS tablet Take 1 tablet (5 mg total) by mouth 2 (two) times daily. 11/28/17   Gladstone Lighter, MD  busPIRone (BUSPAR) 7.5 MG tablet Take 1 tablet twice daily for anxiety. Patient taking differently: Take 7.5 mg by mouth daily. Take 1 tablet twice daily for anxiety. 06/19/17   Pleas Koch, NP  ciprofloxacin (CIPRO) 500 MG tablet Take 1 tablet (500 mg total) by mouth 2 (two) times daily. X 5 days 11/22/17   Gladstone Lighter, MD  docusate sodium (COLACE) 100 MG capsule Take 1 capsule (100 mg total) by mouth 2 (two) times daily. 11/22/17   Gladstone Lighter, MD  feeding  supplement, ENSURE ENLIVE, (ENSURE ENLIVE) LIQD Take 237 mLs by mouth 3 (three) times daily between meals. 11/22/17   Gladstone Lighter, MD  finasteride (PROSCAR) 5 MG tablet Take 1 tablet (5 mg total) by mouth daily. 02/02/18   Festus Aloe, MD  lidocaine (LIDODERM) 5 % Place 1 patch onto the skin daily. Remove & Discard patch within 12 hours or as directed by MD 07/27/17   Henreitta Leber, MD  metoprolol succinate (TOPROL-XL) 50 MG 24 hr tablet Take 50 mg by mouth daily. Take with or immediately following a meal.    [provider]  oxyCODONE (ROXICODONE) 5 MG immediate release tablet Take 1-2 tablets (5-10 mg total) by mouth every 6 (six) hours as needed for moderate pain or severe pain. 11/22/17   Gladstone Lighter, MD  polyethylene glycol (MIRALAX / GLYCOLAX) packet Take 17 g by mouth daily as needed for mild constipation. 11/22/17   Gladstone Lighter, MD  pramipexole (MIRAPEX) 1 MG tablet Take 1 tablet (1 mg total) by mouth at bedtime. 11/22/17   Gladstone Lighter, MD  QUEtiapine (SEROQUEL) 25 MG tablet Take 1 tablet (25 mg total) by mouth at bedtime. 07/27/17   Henreitta Leber, MD  tamsulosin (FLOMAX) 0.4 MG CAPS capsule Take 2 capsules (0.8 mg total) by mouth daily after supper. 02/02/18   Festus Aloe, MD  traMADol (ULTRAM) 50 MG tablet Take 1 tablet (50 mg total) by mouth every 6 (six) hours as needed. 03/07/18   Lisa Roca, MD    Allergies Patient has no known allergies.  Family History  Problem Relation Age of Onset  . Alcohol abuse Father   . Arthritis Mother   . Heart disease Maternal Grandmother   . Prostate cancer Brother     Social History Social History   Tobacco Use  . Smoking status: Current Every Day Smoker    Packs/day: 1.00    Years: 60.00    Pack years: 60.00    Types: Cigarettes  . Smokeless tobacco: Never Used  Substance Use Topics  . Alcohol use: No    Alcohol/week: 0.0 oz  . Drug use: No    Review of Systems  Constitutional:  No fever/chills Eyes: No visual changes. ENT: No sore throat. Cardiovascular: Denies chest pain. Respiratory:  shortness of breath. Gastrointestinal: No abdominal pain.  No nausea, no vomiting.  No diarrhea.  No constipation. Genitourinary: Negative for dysuria. Musculoskeletal: occasional back pain. Skin: Negative for rash. Neurological: Negative for headaches, focal weakness or numbness. Psych: confusion  ____________________________________________   PHYSICAL EXAM:  VITAL SIGNS: ED Triage Vitals  Enc Vitals Group     BP 07/03/18 0100 136/84     Pulse Rate 07/03/18 0057 (!) 107     Resp 07/03/18 0057 18     Temp 07/03/18 0057 98.1 F (36.7 C)  Temp Source 07/03/18 0057 Oral     SpO2 07/03/18 0057 95 %     Weight 07/03/18 0058 227 lb (103 kg)     Height 07/03/18 0058 5\' 7"  (1.702 m)     Head Circumference --      Peak Flow --      Pain Score 07/03/18 0058 0     Pain Loc --      Pain Edu? --      Excl. in [place]? --     Constitutional: Alert and oriented to self and place. Well appearing and in mild distress. Eyes: Conjunctivae are normal. PERRL. EOMI. Head: Atraumatic. Nose: No congestion/rhinnorhea. Mouth/Throat: Mucous membranes are moist.  Oropharynx non-erythematous. Cardiovascular: Irregular rate and rhythm. Grossly normal heart sounds.  Good peripheral circulation. Respiratory: Normal respiratory effort.  No retractions. Mildly diminished breath sounds. Gastrointestinal: Soft and nontender. No distention. Positive bowel sounds Musculoskeletal: Bilateral lower extremity pitting edema Neurologic:  Normal speech and language.  Cranial nerves II through XII are grossly intact with no focal motor or neuro deficit Skin:  Skin is warm, dry and intact. Psychiatric: Mood and affect are normal.   ____________________________________________   LABS (all labs ordered are listed, but only abnormal results are displayed)  Labs Reviewed  COMPREHENSIVE METABOLIC PANEL -  Abnormal; Notable for the following components:      Result Value   Potassium 3.1 (*)    Glucose, Bld 108 (*)    All other components within normal limits  CBC - Abnormal; Notable for the following components:   WBC 11.8 (*)    RDW 15.4 (*)    All other components within normal limits  URINALYSIS, COMPLETE (UACMP) WITH MICROSCOPIC - Abnormal; Notable for the following components:   Color, Urine YELLOW (*)    APPearance CLEAR (*)    Hgb urine dipstick SMALL (*)    All other components within normal limits  BLOOD GAS, VENOUS - Abnormal; Notable for the following components:   Bicarbonate 30.6 (*)    Acid-Base Excess 3.9 (*)    All other components within normal limits  BRAIN NATRIURETIC PEPTIDE - Abnormal; Notable for the following components:   B Natriuretic Peptide 102.0 (*)    All other components within normal limits  PROTIME-INR  TROPONIN I  CBG MONITORING, ED   ____________________________________________  EKG  ED ECG REPORT I, Loney Hering, the attending physician, personally viewed and interpreted this ECG.   Date: 07/03/2018  EKG Time: 0059  Rate: 111  Rhythm: atrial fibrillation, rate 111  Axis: left axis deviation  Intervals:right bundle branch block  ST&T Change: flipped t waves in leads I, avL,  Similar to 11/2017  ____________________________________________  RADIOLOGY  ED MD interpretation:  CXR: Linear fibrosis or atelectasis n the left lung base, no evidence of active pulmonary disease  CT head: No acute intracranial abnormalities, chronic atrophy and small vessel ischemic changes  Official radiology report(s): Dg Chest 2 View  Result Date: 07/03/2018 CLINICAL DATA:  Increased confusion. History of hypertension, congestive heart failure, and back pain. EXAM: CHEST - 2 VIEW COMPARISON:  None. FINDINGS: Linear atelectasis or fibrosis in the left lung base. No airspace disease or consolidation in the lungs. No blunting of costophrenic angles. No  pneumothorax. Mediastinal contours appear intact. Heart size and pulmonary vascularity are normal. Shallow inspiration. Degenerative changes in the spine. IMPRESSION: Linear fibrosis or atelectasis in the left lung base. No evidence of active pulmonary disease. Electronically Signed   By: Gwyndolyn Saxon  Gerilyn Nestle M.D.   On: 07/03/2018 03:00   Ct Head Wo Contrast  Result Date: 07/03/2018 CLINICAL DATA:  Increased confusion, acute. EXAM: CT HEAD WITHOUT CONTRAST TECHNIQUE: Contiguous axial images were obtained from the base of the skull through the vertex without intravenous contrast. COMPARISON:  None. FINDINGS: Brain: Diffuse cerebral atrophy. Ventricular dilatation consistent with central atrophy. Patchy low-attenuation changes in the deep white matter consistent small vessel ischemia. No mass-effect or midline shift. No abnormal extra-axial fluid collections. Gray-white matter junctions are distinct. Basal cisterns are not effaced. No acute intracranial hemorrhage. Vascular: Intracranial arterial vascular calcifications are present. Skull: Calvarium appears intact. Sinuses/Orbits: Paranasal sinuses and mastoid air cells are clear. Other: None. IMPRESSION: No acute intracranial abnormalities. Chronic atrophy and small vessel ischemic changes. Electronically Signed   By: Lucienne Capers M.D.   On: 07/03/2018 03:09    ____________________________________________   PROCEDURES  Procedure(s) performed: None  Procedures  Critical Care performed: No  ____________________________________________   INITIAL IMPRESSION / ASSESSMENT AND PLAN / ED COURSE  As part of my medical decision making, I reviewed the following data within the electronic MEDICAL RECORD NUMBER Notes from prior ED visits and [place] Controlled Substance Database   This is a 77 year old male who comes into the hospital today with some confusion.  The patient states that he was at his assisted living facility and he was confused when he woke up and  did not know where he was.  My differential diagnosis includes urinary tract infection, acute intracranial events, electrolyte abnormality, pneumonia.  We did check some blood work to include a CBC, CMP, blood gas, urinalysis which was all unremarkable.  We also send the patient for a CT scan of his head and cervical spine.  I did give the patient a dose of Ativan 0.5 mg as he does seem very anxious.  Looking over the patient's notes he recently had some medication decreases of his Seroquel and his tramadol.  At this time I do not have a cause for the patient's confusion aside from a previous diagnosis of chronic encephalopathy.  The patient's blood work is otherwise unremarkable.  As he has been here he does have some more prolonged expiratory phase I will give him a DuoNeb treatment as well as some Lasix for his edema.  We will also check a troponin and if his blood work is improved and his breathing appears well he will be discharged and encouraged to follow-up with his primary care physician.  I had a long conversation with the patient's son.  The patient's son states that the patient has been having these intermittent episodes of confusion and psychosis over the past year.  He states that he has not been diagnosed with dementia but he seems to be having them now more frequently and they seem to be getting more worse.  The patient has been having some hallucinations where he feels as though he is seeing people.  He also had called the police as he felt someone was knocking on his window.  He lives in an independent living facility but his son feels that he may be better in an assisted living facility.  The patient's physician has been attempting to adjust his medications but his son feels he may need to be placed on another medicine.  As his work-up is unremarkable he will be discharged to home.  He should return with any worsening symptoms or any other concerns.       ____________________________________________   FINAL CLINICAL IMPRESSION(S) /  ED DIAGNOSES  Final diagnoses:  Confusion  Shortness of breath     ED Discharge Orders    None       Note:  This document was prepared using Dragon voice recognition software and may include unintentional dictation errors.    Loney Hering, MD 07/03/18 [phone]

## 2018-07-03 NOTE — ED Notes (Addendum)
Placed phone calls to both L. Madelin Headings (son) and Normand Sloop (daughter) that were unsuccessful.

## 2018-07-03 NOTE — ED Triage Notes (Signed)
Pt arrived via [place]. Pt called ems because he feels that he is more confused than usual. No pain, NAD. Hx of HTN, CHF, and back pain.

## 2018-07-03 NOTE — ED Notes (Signed)
RT notified to blood gas sent to lab.

## 2018-07-03 NOTE — ED Notes (Signed)
Orange juice provided to pt.

## 2018-07-03 NOTE — ED Notes (Signed)
Patient transported to X-ray 

## 2018-07-05 LAB — BLOOD GAS, VENOUS
Acid-Base Excess: 3.9 mmol/L — ABNORMAL HIGH (ref 0.0–2.0)
BICARBONATE: 30.6 mmol/L — AB (ref 20.0–28.0)
FIO2: 0.21
O2 Saturation: 38 %
PATIENT TEMPERATURE: 37
pCO2, Ven: 53 mmHg (ref 44.0–60.0)
pH, Ven: 7.37 (ref 7.250–7.430)

## 2018-07-14 ENCOUNTER — Other Ambulatory Visit: Payer: Self-pay

## 2018-07-14 ENCOUNTER — Emergency Department
Admission: EM | Admit: 2018-07-14 | Discharge: 2018-07-14 | Disposition: A | Discharge status: Home / Self Care | Payer: Medicare Other | Attending: Emergency Medicine | Admitting: Emergency Medicine

## 2018-07-14 ENCOUNTER — Encounter: Payer: Self-pay | Admitting: Emergency Medicine

## 2018-07-14 ENCOUNTER — Emergency Department: Payer: Medicare Other

## 2018-07-14 DIAGNOSIS — I4891 Unspecified atrial fibrillation: Secondary | ICD-10-CM | POA: Insufficient documentation

## 2018-07-14 DIAGNOSIS — I110 Hypertensive heart disease with heart failure: Secondary | ICD-10-CM | POA: Insufficient documentation

## 2018-07-14 DIAGNOSIS — F0391 Unspecified dementia with behavioral disturbance: Secondary | ICD-10-CM

## 2018-07-14 DIAGNOSIS — F17210 Nicotine dependence, cigarettes, uncomplicated: Secondary | ICD-10-CM | POA: Insufficient documentation

## 2018-07-14 DIAGNOSIS — J439 Emphysema, unspecified: Secondary | ICD-10-CM | POA: Diagnosis not present

## 2018-07-14 DIAGNOSIS — R4020 Unspecified coma: Secondary | ICD-10-CM | POA: Diagnosis not present

## 2018-07-14 DIAGNOSIS — R4182 Altered mental status, unspecified: Secondary | ICD-10-CM | POA: Diagnosis not present

## 2018-07-14 DIAGNOSIS — Z79899 Other long term (current) drug therapy: Secondary | ICD-10-CM | POA: Diagnosis not present

## 2018-07-14 DIAGNOSIS — I509 Heart failure, unspecified: Secondary | ICD-10-CM | POA: Diagnosis not present

## 2018-07-14 DIAGNOSIS — F329 Major depressive disorder, single episode, unspecified: Secondary | ICD-10-CM | POA: Diagnosis not present

## 2018-07-14 DIAGNOSIS — F0390 Unspecified dementia without behavioral disturbance: Secondary | ICD-10-CM | POA: Diagnosis not present

## 2018-07-14 LAB — URINALYSIS, ROUTINE W REFLEX MICROSCOPIC
BILIRUBIN URINE: NEGATIVE
Bacteria, UA: NONE SEEN
GLUCOSE, UA: NEGATIVE mg/dL
KETONES UR: 5 mg/dL — AB
LEUKOCYTES UA: NEGATIVE
NITRITE: NEGATIVE
PROTEIN: 30 mg/dL — AB
Specific Gravity, Urine: 1.023 (ref 1.005–1.030)
pH: 5 (ref 5.0–8.0)

## 2018-07-14 LAB — COMPREHENSIVE METABOLIC PANEL
ALBUMIN: 3.4 g/dL — AB (ref 3.5–5.0)
ALT: 7 U/L (ref 0–44)
AST: 18 U/L (ref 15–41)
Alkaline Phosphatase: 87 U/L (ref 38–126)
Anion gap: 8 (ref 5–15)
BUN: 7 mg/dL — AB (ref 8–23)
CO2: 28 mmol/L (ref 22–32)
Calcium: 9 mg/dL (ref 8.9–10.3)
Chloride: 105 mmol/L (ref 98–111)
Creatinine, Ser: 1.03 mg/dL (ref 0.61–1.24)
GFR calc Af Amer: >60 mL/min (ref >60)
GFR calc non Af Amer: >60 mL/min (ref >60)
GLUCOSE: 95 mg/dL (ref 70–99)
POTASSIUM: 3.5 mmol/L (ref 3.5–5.1)
SODIUM: 141 mmol/L (ref 135–145)
Total Bilirubin: 1 mg/dL (ref 0.3–1.2)
Total Protein: 6.2 g/dL — ABNORMAL LOW (ref 6.5–8.1)

## 2018-07-14 LAB — CBC WITH DIFFERENTIAL/PLATELET
Basophils Absolute: 0.1 10*3/uL (ref 0–0.1)
Basophils Relative: 1 %
EOS PCT: 1 %
Eosinophils Absolute: 0.1 10*3/uL (ref 0–0.7)
HCT: 40.4 % (ref 40.0–52.0)
Hemoglobin: 13.9 g/dL (ref 13.0–18.0)
LYMPHS PCT: 17 %
Lymphs Abs: 1.6 10*3/uL (ref 1.0–3.6)
MCH: 31 pg (ref 26.0–34.0)
MCHC: 34.4 g/dL (ref 32.0–36.0)
MCV: 90.1 fL (ref 80.0–100.0)
MONO ABS: 1 10*3/uL (ref 0.2–1.0)
MONOS PCT: 11 %
NEUTROS ABS: 6.7 10*3/uL — AB (ref 1.4–6.5)
Neutrophils Relative %: 70 %
Platelets: 254 10*3/uL (ref 150–440)
RBC: 4.48 MIL/uL (ref 4.40–5.90)
RDW: 15.4 % — AB (ref 11.5–14.5)
WBC: 9.4 10*3/uL (ref 3.8–10.6)

## 2018-07-14 LAB — LACTIC ACID, PLASMA: LACTIC ACID, VENOUS: 1.5 mmol/L (ref 0.5–1.9)

## 2018-07-14 LAB — VALPROIC ACID LEVEL: Valproic Acid Lvl: 13 ug/mL — ABNORMAL LOW (ref 50.0–100.0)

## 2018-07-14 MED ORDER — FLUOXETINE HCL 10 MG PO TABS: 10.0000 mg | ORAL_TABLET | Freq: Every evening | ORAL | 0 refills | Status: DC

## 2018-07-14 MED ORDER — ALBUTEROL SULFATE (2.5 MG/3ML) 0.083% IN NEBU
5.0000 mg | INHALATION_SOLUTION | Freq: Once | RESPIRATORY_TRACT | Status: AC
Start: 2018-07-14 — End: 2018-07-14
  Administered 2018-07-14: 5 mg via RESPIRATORY_TRACT

## 2018-07-14 MED ORDER — OLANZAPINE 2.5 MG PO TABS: 1.2500 mg | ORAL_TABLET | Freq: Every day | ORAL | 0 refills | Status: DC

## 2018-07-14 MED ORDER — LORAZEPAM 0.5 MG PO TABS
0.5000 mg | ORAL_TABLET | Freq: Once | ORAL | Status: AC
  Administered 2018-07-14: 0.5 mg via ORAL
  Filled 2018-07-14: qty 1

## 2018-07-14 MED ORDER — ALBUTEROL SULFATE (2.5 MG/3ML) 0.083% IN NEBU
INHALATION_SOLUTION | RESPIRATORY_TRACT | Status: AC
  Administered 2018-07-14: 5 mg via RESPIRATORY_TRACT
  Filled 2018-07-14: qty 6

## 2018-07-14 NOTE — NC FL2 (Signed)
[place] LEVEL OF CARE SCREENING TOOL     IDENTIFICATION  Patient Name: Ricardo Erickson Birthdate: 1941/11/03 Sex: male Admission Date (Current Location): 07/14/2018  [place] and [place] Number:  Engineering geologist and Address:  [facility], [address] [place], [place] [address]      Provider Number: [id number]  Attending Physician Name and Address:  Gregor Hams, MD  Relative Name and Phone Number:   Johnathen Testa [phone]    Current Level of Care: Hospital Recommended Level of Care: Memory Care Prior Approval Number:    Date Approved/Denied:   PASRR Number:    Discharge Plan: SNF    Current Diagnoses: Patient Active Problem List   Diagnosis Date Noted  . Pulmonary emboli ([place]) 11/18/2017  . Tobacco use disorder 07/24/2017  . Encephalopathy   . Palliative care encounter   . Goals of care, counseling/discussion   . Pulmonary embolus ([place])   . UTI (urinary tract infection) 07/19/2017  . PTSD (post-traumatic stress disorder) 06/28/2017  . Adjustment disorder with anxiety 06/28/2017  . Closed compression fracture of L5 lumbar vertebra   . Left low back pain   . Weakness   . A-fib ([place]) 06/24/2017  . Prediabetes 01/18/2017  . Prostate cancer ([place]) 01/18/2017  . Anxiety 01/18/2017  . Preventative health care 04/15/2016  . Restless leg syndrome 04/03/2016  . COPD (chronic obstructive pulmonary disease) ([place]) 04/03/2016  . BPH (benign prostatic hyperplasia) 04/03/2016  . Personal history of tobacco use, presenting hazards to health 08/06/2015    Orientation RESPIRATION BLADDER Height & Weight     Self, Situation  Normal Continent Weight: 227 lb (103 kg) Height:  5\' 7"  (170.2 cm)  BEHAVIORAL SYMPTOMS/MOOD NEUROLOGICAL BOWEL NUTRITION STATUS      Continent Diet(normal)  AMBULATORY STATUS COMMUNICATION OF NEEDS Skin   Independent Verbally Normal                       Personal Care Assistance Level of  Assistance  Bathing, Feeding, Dressing, Total care Bathing Assistance: Limited assistance Feeding assistance: Independent Dressing Assistance: Limited assistance Total Care Assistance: Limited assistance   Functional Limitations Info  Sight, Hearing, Speech Sight Info: Adequate Hearing Info: Adequate Speech Info: Adequate    SPECIAL CARE FACTORS FREQUENCY                       Contractures Contractures Info: Not present    Additional Factors Info                  Current Medications (07/14/2018):  This is the current hospital active medication list No current facility-administered medications for this encounter.    Current Outpatient Medications  Medication Sig Dispense Refill  . acetaminophen (TYLENOL) 650 MG CR tablet Take 650 mg by mouth every 8 (eight) hours as needed for pain.    Marland Kitchen ADVAIR DISKUS 250-50 MCG/DOSE AEPB Inhale 1 puff into the lungs every 12 (twelve) hours.  3  . albuterol (PROVENTIL HFA;VENTOLIN HFA) 108 (90 Base) MCG/ACT inhaler Inhale 2 puffs into the lungs as needed for wheezing or shortness of breath.    Marland Kitchen albuterol (PROVENTIL) (2.5 MG/3ML) 0.083% nebulizer solution Inhale 2.5 mg into the lungs every 6 (six) hours as needed for wheezing or shortness of breath.     Marland Kitchen apixaban (ELIQUIS) 5 MG TABS tablet Take 1 tablet (5 mg total) by mouth 2 (two) times daily. 60 tablet 1  .  busPIRone (BUSPAR) 7.5 MG tablet Take 1 tablet twice daily for anxiety. (Patient taking differently: Take 7.5 mg by mouth daily. Take 1 tablet twice daily for anxiety.) 180 tablet 0  . docusate sodium (COLACE) 100 MG capsule Take 1 capsule (100 mg total) by mouth 2 (two) times daily. 10 capsule 0  . feeding supplement, ENSURE ENLIVE, (ENSURE ENLIVE) LIQD Take 237 mLs by mouth 3 (three) times daily between meals. 237 mL 12  . finasteride (PROSCAR) 5 MG tablet Take 1 tablet (5 mg total) by mouth daily. 90 tablet 3  . furosemide (LASIX) 20 MG tablet Take 20 mg by mouth daily.  11  .  metoprolol succinate (TOPROL-XL) 50 MG 24 hr tablet Take 50 mg by mouth daily. Take with or immediately following a meal.    . polyethylene glycol (MIRALAX / GLYCOLAX) packet Take 17 g by mouth daily as needed for mild constipation. 14 each 0  . pramipexole (MIRAPEX) 1 MG tablet Take 1 tablet (1 mg total) by mouth at bedtime. 30 tablet 1  . QUEtiapine (SEROQUEL) 25 MG tablet Take 1 tablet (25 mg total) by mouth at bedtime.    Marland Kitchen SPIRIVA HANDIHALER 18 MCG inhalation capsule Place 18 mcg into inhaler and inhale daily.  11  . tamsulosin (FLOMAX) 0.4 MG CAPS capsule Take 2 capsules (0.8 mg total) by mouth daily after supper. 180 capsule 3  . traMADol (ULTRAM) 50 MG tablet Take 1 tablet (50 mg total) by mouth every 6 (six) hours as needed. (Patient taking differently: Take 50 mg by mouth 2 (two) times daily. ) 10 tablet 0  . lidocaine (LIDODERM) 5 % Place 1 patch onto the skin daily. Remove & Discard patch within 12 hours or as directed by MD (Patient not taking: Reported on 07/14/2018) 30 patch 0  . oxyCODONE (ROXICODONE) 5 MG immediate release tablet Take 1-2 tablets (5-10 mg total) by mouth every 6 (six) hours as needed for moderate pain or severe pain. (Patient not taking: Reported on 07/14/2018) 25 tablet 0     Discharge Medications: Please see discharge summary for a list of discharge medications.  Relevant Imaging Results:  Relevant Lab Results:   Additional Information SSN [id number]  Joana Reamer, [place]

## 2018-07-14 NOTE — ED Provider Notes (Signed)
Patient's family is stepped out briefly.  Patient is resting calmly.  Just still slightly anxious, but appears better after small dose of Ativan.  Awaiting tele-psych consultation on a voluntary basis.  Family updated understanding of delay as tele-psychiatry reports to have a long list of patients to see today.  Vitals:   07/14/18 1000 07/14/18 1300  BP: 102/87 102/71  Pulse: (!) 105   Resp: (!) 27 18  Temp:    SpO2: 95%     Heart rate A. fib presently rate 102, blood pressure 105/71.  Resting comfortably which is some slight ongoing anxiety.  Without labored breathing or distress.  Does appear calmer.   Delman Kitten, MD 07/14/18 [phone]

## 2018-07-14 NOTE — ED Provider Notes (Addendum)
[facility] Emergency Department Provider Note _______________________________   First MD Initiated Contact with Patient 07/14/18 [phone]     (approximate)  I have reviewed the triage vital signs and the nursing notes.  Level 5 caveat: History and physical exam limited secondary to altered mental status. HISTORY  Chief Complaint Altered Mental Status   HPI Ricardo Erickson is a 77 y.o. male with below list of chronic medical conditions including dementia presents the emergency department via EMS from [facility] independent living facility.  EMS stated that they responded to the patient's personal emergency response alarm.  On their arrival patient markedly confused and tearful.  Patient only alert to self stating I do not know where I am, where do I need to be".  Patient presents to the emergency department in similar state only alert to self and very tearful.  Review of the patient's chart revealed a similar presentation to the emergency department on 07/03/2018   Past Medical History:  Diagnosis Date  . CHF (congestive heart failure) ([place])   . Chronic back pain   . Dementia   . Depression   . Emphysema lung ([place])   . Hypertension   . Personal history of tobacco use, presenting hazards to health 08/06/2015  . Pollen allergies   . PTSD (post-traumatic stress disorder)   . Pulmonary embolism [facility])     Patient Active Problem List   Diagnosis Date Noted  . Pulmonary emboli ([place]) 11/18/2017  . Tobacco use disorder 07/24/2017  . Encephalopathy   . Palliative care encounter   . Goals of care, counseling/discussion   . Pulmonary embolus ([place])   . UTI (urinary tract infection) 07/19/2017  . PTSD (post-traumatic stress disorder) 06/28/2017  . Adjustment disorder with anxiety 06/28/2017  . Closed compression fracture of L5 lumbar vertebra   . Left low back pain   . Weakness   . A-fib ([place]) 06/24/2017  . Prediabetes 01/18/2017  . Prostate cancer ([place])  01/18/2017  . Anxiety 01/18/2017  . Preventative health care 04/15/2016  . Restless leg syndrome 04/03/2016  . COPD (chronic obstructive pulmonary disease) ([place]) 04/03/2016  . BPH (benign prostatic hyperplasia) 04/03/2016  . Personal history of tobacco use, presenting hazards to health 08/06/2015    Past Surgical History:  Procedure Laterality Date  . BACK SURGERY    . KYPHOPLASTY N/A 06/27/2017   Procedure: KYPHOPLASTY -L5;  Surgeon: Hessie Knows, MD;  Location: [facility] ORS;  Service: Orthopedics;  Laterality: N/A;  . [place]    Prior to Admission medications   Medication Sig Start Date End Date Taking? Authorizing Provider  acetaminophen (TYLENOL) 650 MG CR tablet Take 650 mg by mouth every 8 (eight) hours as needed for pain.    [provider]  albuterol (PROVENTIL HFA;VENTOLIN HFA) 108 (90 Base) MCG/ACT inhaler Inhale 2 puffs into the lungs as needed for wheezing or shortness of breath.    [provider]  albuterol (PROVENTIL) (2.5 MG/3ML) 0.083% nebulizer solution Inhale 2.5 mg into the lungs every 6 (six) hours as needed.  01/15/15   [provider]  apixaban (ELIQUIS) 5 MG TABS tablet Take 1 tablet (5 mg total) by mouth 2 (two) times daily. 11/28/17   Gladstone Lighter, MD  busPIRone (BUSPAR) 7.5 MG tablet Take 1 tablet twice daily for anxiety. Patient taking differently: Take 7.5 mg by mouth daily. Take 1 tablet twice daily for anxiety. 06/19/17   Pleas Koch, NP  ciprofloxacin (CIPRO) 500 MG tablet  Take 1 tablet (500 mg total) by mouth 2 (two) times daily. X 5 days 11/22/17   Gladstone Lighter, MD  docusate sodium (COLACE) 100 MG capsule Take 1 capsule (100 mg total) by mouth 2 (two) times daily. 11/22/17   Gladstone Lighter, MD  feeding supplement, ENSURE ENLIVE, (ENSURE ENLIVE) LIQD Take 237 mLs by mouth 3 (three) times daily between meals. 11/22/17   Gladstone Lighter, MD  finasteride (PROSCAR) 5 MG tablet Take 1  tablet (5 mg total) by mouth daily. 02/02/18   Festus Aloe, MD  lidocaine (LIDODERM) 5 % Place 1 patch onto the skin daily. Remove & Discard patch within 12 hours or as directed by MD 07/27/17   Henreitta Leber, MD  metoprolol succinate (TOPROL-XL) 50 MG 24 hr tablet Take 50 mg by mouth daily. Take with or immediately following a meal.    [provider]  oxyCODONE (ROXICODONE) 5 MG immediate release tablet Take 1-2 tablets (5-10 mg total) by mouth every 6 (six) hours as needed for moderate pain or severe pain. 11/22/17   Gladstone Lighter, MD  polyethylene glycol (MIRALAX / GLYCOLAX) packet Take 17 g by mouth daily as needed for mild constipation. 11/22/17   Gladstone Lighter, MD  pramipexole (MIRAPEX) 1 MG tablet Take 1 tablet (1 mg total) by mouth at bedtime. 11/22/17   Gladstone Lighter, MD  QUEtiapine (SEROQUEL) 25 MG tablet Take 1 tablet (25 mg total) by mouth at bedtime. 07/27/17   Henreitta Leber, MD  tamsulosin (FLOMAX) 0.4 MG CAPS capsule Take 2 capsules (0.8 mg total) by mouth daily after supper. 02/02/18   Festus Aloe, MD  traMADol (ULTRAM) 50 MG tablet Take 1 tablet (50 mg total) by mouth every 6 (six) hours as needed. 03/07/18   Lisa Roca, MD    Allergies No known drug allergies  Family History  Problem Relation Age of Onset  . Alcohol abuse Father   . Arthritis Mother   . Heart disease Maternal Grandmother   . Prostate cancer Brother     Social History Social History   Tobacco Use  . Smoking status: Current Every Day Smoker    Packs/day: 1.00    Years: 60.00    Pack years: 60.00    Types: Cigarettes  . Smokeless tobacco: Never Used  Substance Use Topics  . Alcohol use: No    Alcohol/week: 0.0 oz  . Drug use: No    Review of Systems Constitutional: No fever/chills Eyes: No visual changes. ENT: No sore throat. Cardiovascular: Denies chest pain. Respiratory: Denies shortness of breath. Gastrointestinal: No abdominal pain.  No nausea, no  vomiting.  No diarrhea.  No constipation. Genitourinary: Negative for dysuria. Musculoskeletal: Negative for neck pain.  Negative for back pain. Integumentary: Negative for rash. Neurological: Negative for headaches, focal weakness or numbness.  Positive for confusion  ____________________________________________   PHYSICAL EXAM:  VITAL SIGNS: ED Triage Vitals  Enc Vitals Group     BP 07/14/18 0441 101/78     Pulse Rate 07/14/18 0441 (!) 103     Resp 07/14/18 0441 (!) 26     Temp 07/14/18 0441 97.8 F (36.6 C)     Temp Source 07/14/18 0441 Oral     SpO2 07/14/18 0441 96 %     Weight 07/14/18 0442 103 kg (227 lb)     Height 07/14/18 0442 1.702 m (5\' 7" )     Head Circumference --      Peak Flow --      Pain Score  07/14/18 0442 0     Pain Loc --      Pain Edu? --      Excl. in [place]? --     Constitutional: Alert but confused..  Tearful  eyes: Conjunctivae are normal. PERRL. EOMI. Head: Atraumatic. Nose: No congestion/rhinnorhea. Mouth/Throat: Mucous membranes are moist. Oropharynx non-erythematous. Neck: No stridor.  No meningeal signs.   Cardiovascular: Normal rate, regular rhythm. Good peripheral circulation. Grossly normal heart sounds. Respiratory: Tachypnea.  No retractions.  Bibasilar wheezes Gastrointestinal: Soft and nontender. No distention.  Musculoskeletal: No lower extremity tenderness nor edema. No gross deformities of extremities. Neurologic:  Normal speech and language. No gross focal neurologic deficits are appreciated.  Skin:  Skin is warm, dry and intact. No rash noted. Psychiatric: Mood and affect are normal. Speech and behavior are normal.  ____________________________________________   LABS (all labs ordered are listed, but only abnormal results are displayed)  Labs Reviewed  COMPREHENSIVE METABOLIC PANEL - Abnormal; Notable for the following components:      Result Value   BUN 7 (*)    Total Protein 6.2 (*)    Albumin 3.4 (*)    All other  components within normal limits  CBC WITH DIFFERENTIAL/PLATELET - Abnormal; Notable for the following components:   RDW 15.4 (*)    Neutro Abs 6.7 (*)    All other components within normal limits  URINALYSIS, ROUTINE W REFLEX MICROSCOPIC - Abnormal; Notable for the following components:   Color, Urine YELLOW (*)    APPearance HAZY (*)    Hgb urine dipstick LARGE (*)    Ketones, ur 5 (*)    Protein, ur 30 (*)    RBC / HPF >50 (*)    All other components within normal limits  CULTURE, BLOOD (ROUTINE X 2)  CULTURE, BLOOD (ROUTINE X 2)  LACTIC ACID, PLASMA  LACTIC ACID, PLASMA   ________ ED ECG REPORT I, [place] N Lashaundra Lehrmann, the attending physician, personally viewed and interpreted this ECG.   Date: 07/14/2018  EKG Time: 4:40 AM  Rate: 105  Rhythm: Sinus tachycardia  Axis: Normal  Intervals: Normal  ST&T Change: None  RADIOLOGY I, [place] Novia [place], personally viewed and evaluated these images (plain radiographs) as part of my medical decision making, as well as reviewing the written report by the radiologist.  ED MD interpretation: CT head revealed no acute intracranial findings.  Official radiology report(s): Ct Head Wo Contrast  Result Date: 07/14/2018 CLINICAL DATA:  Altered level of consciousness. EXAM: CT HEAD WITHOUT CONTRAST TECHNIQUE: Contiguous axial images were obtained from the base of the skull through the vertex without intravenous contrast. COMPARISON:  Head CT 10 days prior 07/03/2018 FINDINGS: Brain: Unchanged degree of atrophy and chronic small vessel ischemia. No intracranial hemorrhage, mass effect, or midline shift. No hydrocephalus. The basilar cisterns are patent. No evidence of territorial infarct or acute ischemia. No extra-axial or intracranial fluid collection. Vascular: Atherosclerosis of skullbase vasculature without hyperdense vessel or abnormal calcification. Skull: No fracture or focal lesion. Sinuses/Orbits: Paranasal sinuses and mastoid air cells are  clear. The visualized orbits are unremarkable. Other: None. IMPRESSION: 1.  No acute intracranial abnormality. 2. Stable degree of atrophy and chronic small vessel ischemia. Electronically Signed   By: Jeb Levering M.D.   On: 07/14/2018 05:51    _______________   Procedures   ____________________________________________   INITIAL IMPRESSION / ASSESSMENT AND PLAN / ED COURSE  As part of my medical decision making, I reviewed the following data within the electronic  MEDICAL RECORD NUMBER   77 year old male presenting with above-stated history and physical exam tender to confusion.  Review of the patient's chart revealed a history of dementia however will evaluate the patient for possibility of delirium as such appropriate laboratory data was obtained.  All laboratory data thus far unremarkable and as such concern for possibility of dementia with mood disturbance.  Social work consult placed as patient may require a higher level of care. ____________________________________________  FINAL CLINICAL IMPRESSION(S) / ED DIAGNOSES  Final diagnoses:  Altered mental status, unspecified altered mental status type     MEDICATIONS GIVEN DURING THIS VISIT:  Medications  albuterol (PROVENTIL) (2.5 MG/3ML) 0.083% nebulizer solution 5 mg (5 mg Nebulization Given 07/14/18 0446)     ED Discharge Orders    None       Note:  This document was prepared using Dragon voice recognition software and may include unintentional dictation errors.    Gregor Hams, MD 07/14/18 6283    Gregor Hams, MD 08/02/18 2230

## 2018-07-14 NOTE — ED Notes (Signed)
Pt to CT

## 2018-07-14 NOTE — ED Notes (Signed)
Pt's son at bedside talking with SW Claudine.

## 2018-07-14 NOTE — Progress Notes (Signed)
CODE SEPSIS - PHARMACY COMMUNICATION  **Broad Spectrum Antibiotics should be administered within 1 hour of Sepsis diagnosis**  Time Code Sepsis Called/Page Received: 04:32  Antibiotics Ordered: none  Time of 1st antibiotic administration: N/A  Additional action taken by pharmacy:   If necessary, Name of Provider/Nurse Contacted:     Laural Benes ,PharmD Clinical Pharmacist  07/14/2018  7:35 AM

## 2018-07-14 NOTE — ED Notes (Signed)
Patient transported to CT 

## 2018-07-14 NOTE — Progress Notes (Signed)
LCSW met with patients son and discussed patient current plan of care. Son is very well connected to several facilities and when his father requires increased care he will relocate him to an appropriate facility. LCSW provided information to family on medicaid and medicare and dementia services   Currently patient resides at [facility] and family has provided 1-1 personal care support worker in mornings and early evening. The family will make special arrangements to provide additional in home care and have contacts through patients current residence which the family will access  No further needs from SW he will discharge home with son to [facility].  Gitty Osterlund LCSW

## 2018-07-14 NOTE — Clinical Social Work Note (Signed)
Clinical Social Work Assessment  Patient Details  Name: Ricardo Erickson MRN: [id number] Date of Birth: May 05, 1941  Date of referral:  07/14/18               Reason for consult:  Facility Placement                Permission sought to share information with:  Family Supports, Guardian, Customer service manager Permission granted to share information::     Name::     Ricardo Erickson [phone] son and guardian  Agency::     Relationship::     Contact Information:  All facilities  Housing/Transportation Living arrangements for the past 2 months:  [place] of Information:  Patient Patient Interpreter Needed:  None Criminal Activity/Legal Involvement Pertinent to Current Situation/Hospitalization:  No - Comment as needed Significant Relationships:  Adult Children Lives with:  Facility Resident Do you feel safe going back to the place where you live?  Yes Need for family participation in patient care:  Yes (Comment)  Care giving concerns:  TBD   Social Worker assessment / plan: LCSW introduced myself to patient and he gave verbal consent to contact his son who is guardian. He has excellent family support. He is pretty independent and uses a walker. Patient is oriented x2. He reports he has memory issues. [place] living- he pulled his alarm system and thought it best to come to hospital and had altered mental status.  Employment status:  Retired Forensic scientist:  Medicare(Blue [employer] blue sheild) PT Recommendations:   not required Information / Referral to community resources:   SNF  Patient/Family's Response to care:  Son will come in at 9am  Patient/Family's Understanding of and Emotional Response to Diagnosis, Current Treatment, and Prognosis:  He reports he was frightened at his facility  Emotional Assessment Appearance:  Appears stated age Attitude/Demeanor/Rapport:  Engaged Affect (typically observed):  Accepting, Calm Orientation:   Oriented to Self Alcohol / Substance use:  Not Applicable Psych involvement (Current and /or in the community):  No (Comment)  Discharge Needs  Concerns to be addressed:   Find locked facility or increase in home supports. Readmission within the last 30 days:  No Current discharge risk:  None Barriers to Discharge:  No Barriers Identified   Joana Reamer, LCSW 07/14/2018, 8:40 AM

## 2018-07-14 NOTE — ED Provider Notes (Signed)
Met with the patient's son, [name].  Reports currently his father lives in independent living but he is able to and has the means to add additional services and assistance for him temporarily.  Cannot also acknowledges that he has thought about and is beginning planning process for placing his father to a higher level of care.  Reports that symptoms like his dad has exhibited come and go sometimes will last for a week or 2 and then go away in the past, but attributable to his dementia and some occasional panic.  He would be amenable and would like to speak to psychiatry about his father's medications, and also will speak with social work regarding future planning and placement care needs.  [name] does report he feels comfortable taking his father back and can add services to assist and make sure that he is not wandering or exhbiting  concerning behavior.  ----------------------------------------- 3:13 PM on 07/14/2018 -----------------------------------------  Patient being seen by tele-psychiatry.  Ongoing care assigned to Dr. Cherylann Banas, follow-up on tele-psych recommendations.   Delman Kitten, MD 07/14/18 [phone]

## 2018-07-14 NOTE — ED Notes (Signed)
Pt eating breakfast, orange juice, egg biscuit, orange

## 2018-07-14 NOTE — ED Notes (Signed)
Pt brief changed, peri care performed, and a full linen change.

## 2018-07-14 NOTE — ED Triage Notes (Signed)
Pt arrived to ER via EMS from [place], Per EMS pt called his personal emergency response alarm, pt confused on arrival, tearful. Pt alert to self, "I don't know where am I, where do I need to be"

## 2018-07-14 NOTE — Discharge Instructions (Addendum)
Discontinue the Depakote.  Take the fluoxetine and olanzapine as prescribed.  Follow-up in 1 week to assess how well these medicines are being tolerated.  You have been seen in the Emergency Department (ED) today for a psychiatric complaint.  You have been evaluated by psychiatry and we believe you are safe to be discharged from the hospital but need close follow-up with Dr. Sabra Heck (your primary doctor) and a referral to local psychiatry as well.  Please return Ricardo Erickson to the ED immediately if ANY thoughts of hurting himself or anyone else, erratic or dangerous behavior, or other concerns arise so that we may help him further.  Please avoid alcohol and drug use.  Follow up with your doctor and/or therapist as soon as possible regarding today's ED visit.   Please follow up any other recommendations and clinic appointments provided by the psychiatry team that saw you in the Emergency Department.

## 2018-07-14 NOTE — ED Provider Notes (Signed)
Patient voluntarily to see and speak with psychiatry as he has currently on medications including Depakote which is been recently started Seroquel.  His son cannot is at the bedside, plan to both speak to the psychiatrist.  Patient was noted to be slightly tachycardic, but in speaking had not taken his home medications yet.  His son cannot actually brought his home medications and given to him at this morning in the ER just about 30 minutes ago, including his Toprol-XL.  Some A. fib with slight rapid ventricular rate but patient asymptomatic.  Awaiting tele-psych consult.  Patient is alert, does appear anxious, reports he would like to build to go home soon, wants to return to his independent living.  Son comfortable with plan to take him home, plans to talk to Dr. Sabra Heck, and the plan to set up outpatient psychiatry but are agreeable to speak to psychiatrist here today regarding the patient's symptoms as well.   Ricardo Kitten, MD 07/14/18 1150

## 2018-07-14 NOTE — BH Assessment (Signed)
Assessment Note  Ricardo Erickson is an 77 y.o. male. Patient presents to [facility] due to anxiety. Patient endorses depression, however denies HI/SI/AVH. Per patients son/legal guardian Ikeem Cleckler [phone]) patient is experiencing anxiety characterized by panic attacks. Patient states he thinks people are trying to get him. Son feels patient may have early symptoms of dementia.  Patient doesn't currently have any involvement in the legal system.  Patient denies illicit drug and alcohol use.  Patient presents oriented x 4 and pleasant during assessment.   Diagnosis: Unspecified Anxiety Disorder  Past Medical History:  Past Medical History:  Diagnosis Date  . CHF (congestive heart failure) ([place])   . Chronic back pain   . Dementia   . Depression   . Emphysema lung ([place])   . Hypertension   . Personal history of tobacco use, presenting hazards to health 08/06/2015  . Pollen allergies   . PTSD (post-traumatic stress disorder)   . Pulmonary embolism [facility])     Past Surgical History:  Procedure Laterality Date  . BACK SURGERY    . KYPHOPLASTY N/A 06/27/2017   Procedure: KYPHOPLASTY -L5;  Surgeon: Hessie Knows, MD;  Location: [facility] ORS;  Service: Orthopedics;  Laterality: N/A;  . TONSILLECTOMY AND ADENOIDECTOMY  1980    Family History:  Family History  Problem Relation Age of Onset  . Alcohol abuse Father   . Arthritis Mother   . Heart disease Maternal Grandmother   . Prostate cancer Brother     Social History:  reports that he has been smoking cigarettes.  He has a 60.00 pack-year smoking history. He has never used smokeless tobacco. He reports that he does not drink alcohol or use drugs.  Additional Social History:  Alcohol / Drug Use Pain Medications: SEE PTA  Prescriptions: SEE PTA  Over the Counter: SEE PTA  History of alcohol / drug use?: No history of alcohol / drug abuse Longest period of sobriety (when/how long): None reported   CIWA: CIWA-Ar BP: 102/87 Pulse  Rate: (!) 105 COWS:    Allergies: No Known Allergies  Home Medications:  (Not in a hospital admission)  OB/GYN Status:  No LMP for male patient.  General Assessment Data Assessment unable to be completed: (Assessment completed) Location of Assessment: [facility] ED TTS Assessment: In system Is this a Tele or Face-to-Face Assessment?: Face-to-Face Is this an Initial Assessment or a Re-assessment for this encounter?: Initial Assessment Marital status: Widowed [place] name: N/A Is patient pregnant?: No Pregnancy Status: No Living Arrangements: Other (Comment)(Assisted [place] ) Can pt return to current living arrangement?: Yes Admission Status: Voluntary Is patient capable of signing voluntary admission?: Yes Referral Source: Self/Family/Friend Insurance type: BCBS Medicare   Medical Screening Exam ([place]) Medical Exam completed: Yes  Crisis Care Plan Living Arrangements: Other (Comment)([facility] [place] ) Legal Guardian: Other relative(Keny Knisley (son) [phone]) Name of Psychiatrist: None reported Name of Therapist: None reported  Education Status Is patient currently in school?: No Is the patient employed, unemployed or receiving disability?: Receiving disability income, Unemployed  Risk to self with the past 6 months Suicidal Ideation: No Has patient been a risk to self within the past 6 months prior to admission? : No Suicidal Intent: No Has patient had any suicidal intent within the past 6 months prior to admission? : No Is patient at risk for suicide?: No Suicidal Plan?: No Has patient had any suicidal plan within the past 6 months prior to admission? : No Access to  Means: No What has been your use of drugs/alcohol within the last 12 months?: None reported Previous Attempts/Gestures: No How many times?: 0 Other Self Harm Risks: None reported Triggers for Past Attempts: Other (Comment)(None  reported) Intentional Self Injurious Behavior: None Family Suicide History: No Recent stressful life event(s): Recent negative physical changes Persecutory voices/beliefs?: No Depression: Yes Depression Symptoms: Feeling worthless/self pity Substance abuse history and/or treatment for substance abuse?: No Suicide prevention information given to non-admitted patients: Not applicable  Risk to Others within the past 6 months Homicidal Ideation: No Does patient have any lifetime risk of violence toward others beyond the six months prior to admission? : No Thoughts of Harm to Others: No Current Homicidal Intent: No Current Homicidal Plan: No Access to Homicidal Means: No Identified Victim: None reported History of harm to others?: No Assessment of Violence: None Noted Violent Behavior Description: None reported Does patient have access to weapons?: No Criminal Charges Pending?: No Does patient have a court date: No Is patient on probation?: No  Psychosis Hallucinations: None noted Delusions: None noted  Mental Status Report Appearance/Hygiene: In scrubs Eye Contact: Fair Motor Activity: Unremarkable Speech: Unremarkable Level of Consciousness: Alert Mood: Pleasant Affect: Other (Comment)(pleasant ) Anxiety Level: None Thought Processes: Flight of Ideas Judgement: Impaired Orientation: Person, Appropriate for developmental age Obsessive Compulsive Thoughts/Behaviors: None  Cognitive Functioning Concentration: Fair Memory: Recent Intact, Remote Impaired Is patient IDD: No Is patient DD?: No Insight: Poor Impulse Control: Fair Appetite: Good Have you had any weight changes? : No Change Sleep: No Change Total Hours of Sleep: 8 Vegetative Symptoms: None  ADLScreening [facility] Assessment Services) Patient's cognitive ability adequate to safely complete daily activities?: No Patient able to express need for assistance with ADLs?: Yes Independently performs ADLs?:  No  Prior Inpatient Therapy Prior Inpatient Therapy: No  Prior Outpatient Therapy Prior Outpatient Therapy: No Does patient have an ACCT team?: No Does patient have Intensive In-House Services?  : No Does patient have [facility] services? : No Does patient have P4CC services?: No  ADL Screening (condition at time of admission) Patient's cognitive ability adequate to safely complete daily activities?: No Is the patient deaf or have difficulty hearing?: No Does the patient have difficulty seeing, even when wearing glasses/contacts?: No Does the patient have difficulty concentrating, remembering, or making decisions?: Yes Patient able to express need for assistance with ADLs?: Yes Does the patient have difficulty dressing or bathing?: No Independently performs ADLs?: No Does the patient have difficulty walking or climbing stairs?: Yes Weakness of Legs: None Weakness of Arms/Hands: None  Home Assistive Devices/Equipment Home Assistive Devices/Equipment: None  Therapy Consults (therapy consults require a physician order) PT Evaluation Needed: No OT Evalulation Needed: No SLP Evaluation Needed: No Abuse/Neglect Assessment (Assessment to be complete while patient is alone) Abuse/Neglect Assessment Can Be Completed: Yes Physical Abuse: Denies Verbal Abuse: Denies Sexual Abuse: Denies Exploitation of patient/patient's resources: Denies Self-Neglect: Denies Values / Beliefs Cultural Requests During Hospitalization: None Spiritual Requests During Hospitalization: None Consults Spiritual Care Consult Needed: No Social Work Consult Needed: No Regulatory affairs officer (For Healthcare) Does Patient Have a Medical Advance Directive?: No Would patient like information on creating a medical advance directive?: No - Patient declined          Disposition:  Disposition Initial Assessment Completed for this Encounter: Yes Patient referred to: Other (Comment)(pending psych consult )  On  Site Evaluation by:   Reviewed with Physician:    Jodie Echevaria, [place], LCAS-A 07/14/2018 12:41 PM

## 2018-07-14 NOTE — ED Notes (Signed)
RN called and spoke with pt's son informed about pt's condition son concerned pt has been having this behavior more frequent discussed with pt's son about sundown syndrome, nursing facility where pt is more monitored reports not able to come to ER after 9am [phone]

## 2018-07-15 ENCOUNTER — Encounter: Payer: Self-pay | Admitting: Emergency Medicine

## 2018-07-15 ENCOUNTER — Other Ambulatory Visit: Payer: Self-pay

## 2018-07-15 ENCOUNTER — Emergency Department
Admission: EM | Admit: 2018-07-15 | Discharge: 2018-07-15 | Disposition: A | Discharge status: Home / Self Care | Payer: Medicare Other | Attending: Emergency Medicine | Admitting: Emergency Medicine

## 2018-07-15 DIAGNOSIS — I509 Heart failure, unspecified: Secondary | ICD-10-CM | POA: Diagnosis not present

## 2018-07-15 DIAGNOSIS — R799 Abnormal finding of blood chemistry, unspecified: Secondary | ICD-10-CM | POA: Diagnosis not present

## 2018-07-15 DIAGNOSIS — Z8546 Personal history of malignant neoplasm of prostate: Secondary | ICD-10-CM | POA: Diagnosis not present

## 2018-07-15 DIAGNOSIS — J449 Chronic obstructive pulmonary disease, unspecified: Secondary | ICD-10-CM | POA: Insufficient documentation

## 2018-07-15 DIAGNOSIS — F17210 Nicotine dependence, cigarettes, uncomplicated: Secondary | ICD-10-CM | POA: Diagnosis not present

## 2018-07-15 DIAGNOSIS — Z79899 Other long term (current) drug therapy: Secondary | ICD-10-CM | POA: Insufficient documentation

## 2018-07-15 DIAGNOSIS — F0390 Unspecified dementia without behavioral disturbance: Secondary | ICD-10-CM | POA: Insufficient documentation

## 2018-07-15 DIAGNOSIS — I4891 Unspecified atrial fibrillation: Secondary | ICD-10-CM | POA: Insufficient documentation

## 2018-07-15 DIAGNOSIS — I110 Hypertensive heart disease with heart failure: Secondary | ICD-10-CM | POA: Diagnosis not present

## 2018-07-15 DIAGNOSIS — Z86718 Personal history of other venous thrombosis and embolism: Secondary | ICD-10-CM | POA: Insufficient documentation

## 2018-07-15 DIAGNOSIS — Z862 Personal history of diseases of the blood and blood-forming organs and certain disorders involving the immune mechanism: Secondary | ICD-10-CM | POA: Diagnosis not present

## 2018-07-15 DIAGNOSIS — Z9289 Personal history of other medical treatment: Secondary | ICD-10-CM

## 2018-07-15 DIAGNOSIS — Z7901 Long term (current) use of anticoagulants: Secondary | ICD-10-CM | POA: Diagnosis not present

## 2018-07-15 LAB — BASIC METABOLIC PANEL
Anion gap: 6 (ref 5–15)
BUN: 10 mg/dL (ref 8–23)
CO2: 27 mmol/L (ref 22–32)
CREATININE: 0.92 mg/dL (ref 0.61–1.24)
Calcium: 8.8 mg/dL — ABNORMAL LOW (ref 8.9–10.3)
Chloride: 106 mmol/L (ref 98–111)
GFR calc Af Amer: >60 mL/min (ref >60)
GFR calc non Af Amer: >60 mL/min (ref >60)
Glucose, Bld: 95 mg/dL (ref 70–99)
Potassium: 3.8 mmol/L (ref 3.5–5.1)
Sodium: 139 mmol/L (ref 135–145)

## 2018-07-15 LAB — CBC WITH DIFFERENTIAL/PLATELET
Basophils Absolute: 0.1 10*3/uL (ref 0–0.1)
Basophils Relative: 1 %
EOS ABS: 0.1 10*3/uL (ref 0–0.7)
EOS PCT: 1 %
HCT: 42.2 % (ref 40.0–52.0)
Hemoglobin: 14.4 g/dL (ref 13.0–18.0)
LYMPHS PCT: 17 %
Lymphs Abs: 1.8 10*3/uL (ref 1.0–3.6)
MCH: 30.9 pg (ref 26.0–34.0)
MCHC: 34 g/dL (ref 32.0–36.0)
MCV: 90.7 fL (ref 80.0–100.0)
MONO ABS: 1.2 10*3/uL — AB (ref 0.2–1.0)
MONOS PCT: 11 %
Neutro Abs: 7.2 10*3/uL — ABNORMAL HIGH (ref 1.4–6.5)
Neutrophils Relative %: 70 %
PLATELETS: 256 10*3/uL (ref 150–440)
RBC: 4.65 MIL/uL (ref 4.40–5.90)
RDW: 15.5 % — AB (ref 11.5–14.5)
WBC: 10.4 10*3/uL (ref 3.8–10.6)

## 2018-07-15 LAB — BLOOD CULTURE ID PANEL (REFLEXED)
Acinetobacter baumannii: NOT DETECTED
CANDIDA ALBICANS: NOT DETECTED
CANDIDA GLABRATA: NOT DETECTED
CANDIDA TROPICALIS: NOT DETECTED
Candida krusei: NOT DETECTED
Candida parapsilosis: NOT DETECTED
ENTEROBACTER CLOACAE COMPLEX: NOT DETECTED
Enterobacteriaceae species: NOT DETECTED
Enterococcus species: NOT DETECTED
Escherichia coli: NOT DETECTED
Haemophilus influenzae: NOT DETECTED
Klebsiella oxytoca: NOT DETECTED
Klebsiella pneumoniae: NOT DETECTED
Listeria monocytogenes: NOT DETECTED
METHICILLIN RESISTANCE: DETECTED — AB
Neisseria meningitidis: NOT DETECTED
Proteus species: NOT DETECTED
Pseudomonas aeruginosa: NOT DETECTED
STREPTOCOCCUS SPECIES: NOT DETECTED
Serratia marcescens: NOT DETECTED
Staphylococcus aureus (BCID): NOT DETECTED
Staphylococcus species: DETECTED — AB
Streptococcus agalactiae: NOT DETECTED
Streptococcus pneumoniae: NOT DETECTED
Streptococcus pyogenes: NOT DETECTED

## 2018-07-15 NOTE — ED Notes (Signed)
Attempted to reach nursing regarding positive blood cultures. Instead reached the chef who stated we would need to call back at 0730. Will notify dayshift charge of need to make contact for patient to return to ED.

## 2018-07-15 NOTE — ED Notes (Signed)
Pt lab sent with white label as chart is locked in sunquest. Lab aware.

## 2018-07-15 NOTE — ED Provider Notes (Signed)
-----------------------------------------   5:57 AM on 07/15/2018 -----------------------------------------  Notified by pharmacy that patient has 2+ MRSA blood cultures.  Will have charge nurse call patient's facility to send him back for reevaluation and likely hospitalization.   Paulette Blanch, MD 07/15/18 [phone]

## 2018-07-15 NOTE — ED Provider Notes (Addendum)
[facility] Emergency Department Provider Note  ____________________________________________   I have reviewed the triage vital signs and the nursing notes. Where available I have reviewed prior notes and, if possible and indicated, outside hospital notes.    HISTORY  Chief Complaint Call back    HPI Ricardo Erickson is a 77 y.o. male who is here for agitation, and dementia yesterday.  Blood cultures were obtained, it is not clear exactly why at this time.  There was no fever and no reported fever and family states that he was not otherwise acting affected.  Half of the tubes came back with a coagulase-negative staph which her pharmacy feels very strongly is a contaminant.  Patient has been at his baseline at home.  No fevers no infectious symptoms no dysuria no cough no shortness of breath no Reiger's no chills, but he was called to come back for further evaluation.  He has no complaints he is at his baseline and his son, who is here with him, very strongly would prefer to go home as soon as possible.  Past Medical History:  Diagnosis Date  . CHF (congestive heart failure) ([place])   . Chronic back pain   . Dementia   . Depression   . Emphysema lung ([place])   . Hypertension   . Personal history of tobacco use, presenting hazards to health 08/06/2015  . Pollen allergies   . PTSD (post-traumatic stress disorder)   . Pulmonary embolism [facility])     Patient Active Problem List   Diagnosis Date Noted  . Pulmonary emboli ([place]) 11/18/2017  . Tobacco use disorder 07/24/2017  . Encephalopathy   . Palliative care encounter   . Goals of care, counseling/discussion   . Pulmonary embolus ([place])   . UTI (urinary tract infection) 07/19/2017  . PTSD (post-traumatic stress disorder) 06/28/2017  . Adjustment disorder with anxiety 06/28/2017  . Closed compression fracture of L5 lumbar vertebra   . Left low back pain   . Weakness   . A-fib ([place]) 06/24/2017  . Prediabetes  01/18/2017  . Prostate cancer ([place]) 01/18/2017  . Anxiety 01/18/2017  . Preventative health care 04/15/2016  . Restless leg syndrome 04/03/2016  . COPD (chronic obstructive pulmonary disease) ([place]) 04/03/2016  . BPH (benign prostatic hyperplasia) 04/03/2016  . Personal history of tobacco use, presenting hazards to health 08/06/2015    Past Surgical History:  Procedure Laterality Date  . BACK SURGERY    . KYPHOPLASTY N/A 06/27/2017   Procedure: KYPHOPLASTY -L5;  Surgeon: Hessie Knows, MD;  Location: [facility] ORS;  Service: Orthopedics;  Laterality: N/A;  . [place]    Prior to Admission medications   Medication Sig Start Date End Date Taking? Authorizing Provider  acetaminophen (TYLENOL) 650 MG CR tablet Take 650 mg by mouth every 8 (eight) hours as needed for pain.    [provider]  ADVAIR DISKUS 250-50 MCG/DOSE AEPB Inhale 1 puff into the lungs every 12 (twelve) hours. 04/09/18   [provider]  albuterol (PROVENTIL HFA;VENTOLIN HFA) 108 (90 Base) MCG/ACT inhaler Inhale 2 puffs into the lungs as needed for wheezing or shortness of breath.    [provider]  albuterol (PROVENTIL) (2.5 MG/3ML) 0.083% nebulizer solution Inhale 2.5 mg into the lungs every 6 (six) hours as needed for wheezing or shortness of breath.  01/15/15   [provider]  apixaban (ELIQUIS) 5 MG TABS tablet Take 1 tablet (5 mg total) by mouth 2 (two) times daily.  11/28/17   Gladstone Lighter, MD  docusate sodium (COLACE) 100 MG capsule Take 1 capsule (100 mg total) by mouth 2 (two) times daily. 11/22/17   Gladstone Lighter, MD  feeding supplement, ENSURE ENLIVE, (ENSURE ENLIVE) LIQD Take 237 mLs by mouth 3 (three) times daily between meals. 11/22/17   Gladstone Lighter, MD  finasteride (PROSCAR) 5 MG tablet Take 1 tablet (5 mg total) by mouth daily. 02/02/18   Festus Aloe, MD  FLUoxetine (PROZAC) 10 MG tablet Take 1 tablet (10 mg total) by mouth every  evening. 07/14/18 09/12/18  Arta Silence, MD  furosemide (LASIX) 20 MG tablet Take 20 mg by mouth daily. 06/29/18   [provider]  lidocaine (LIDODERM) 5 % Place 1 patch onto the skin daily. Remove & Discard patch within 12 hours or as directed by MD Patient not taking: Reported on 07/14/2018 07/27/17   Henreitta Leber, MD  metoprolol succinate (TOPROL-XL) 50 MG 24 hr tablet Take 50 mg by mouth daily. Take with or immediately following a meal.    [provider]  OLANZapine (ZYPREXA) 2.5 MG tablet Take 0.5 tablets (1.25 mg total) by mouth at bedtime. 07/14/18 08/13/18  Arta Silence, MD  oxyCODONE (ROXICODONE) 5 MG immediate release tablet Take 1-2 tablets (5-10 mg total) by mouth every 6 (six) hours as needed for moderate pain or severe pain. Patient not taking: Reported on 07/14/2018 11/22/17   Gladstone Lighter, MD  polyethylene glycol [facility] Floria Raveling) packet Take 17 g by mouth daily as needed for mild constipation. 11/22/17   Gladstone Lighter, MD  pramipexole (MIRAPEX) 1 MG tablet Take 1 tablet (1 mg total) by mouth at bedtime. 11/22/17   Gladstone Lighter, MD  QUEtiapine (SEROQUEL) 25 MG tablet Take 1 tablet (25 mg total) by mouth at bedtime. 07/27/17   Henreitta Leber, MD  SPIRIVA HANDIHALER 18 MCG inhalation capsule Place 18 mcg into inhaler and inhale daily. 06/11/18   [provider]  tamsulosin (FLOMAX) 0.4 MG CAPS capsule Take 2 capsules (0.8 mg total) by mouth daily after supper. 02/02/18   Festus Aloe, MD  traMADol (ULTRAM) 50 MG tablet Take 1 tablet (50 mg total) by mouth every 6 (six) hours as needed. Patient taking differently: Take 50 mg by mouth 2 (two) times daily.  03/07/18   Lisa Roca, MD    Allergies Patient has no known allergies.  Family History  Problem Relation Age of Onset  . Alcohol abuse Father   . Arthritis Mother   . Heart disease Maternal Grandmother   . Prostate cancer Brother     Social History Social History    Tobacco Use  . Smoking status: Current Every Day Smoker    Packs/day: 1.00    Years: 60.00    Pack years: 60.00    Types: Cigarettes  . Smokeless tobacco: Never Used  Substance Use Topics  . Alcohol use: No    Alcohol/week: 0.0 oz  . Drug use: No    Review of Systems Constitutional: No fever/chills Eyes: No visual changes. ENT: No sore throat. No stiff neck no neck pain Cardiovascular: Denies chest pain. Respiratory: Denies shortness of breath. Gastrointestinal:   no vomiting.  No diarrhea.  No constipation. Genitourinary: Negative for dysuria. Musculoskeletal: Negative lower extremity swelling Skin: Negative for rash. Neurological: Negative for severe headaches, focal weakness or numbness.   ____________________________________________   PHYSICAL EXAM:  VITAL SIGNS: ED Triage Vitals  Enc Vitals Group     BP 07/15/18 1124 116/76  Pulse Rate 07/15/18 1124 (!) 56     Resp 07/15/18 1124 16     Temp 07/15/18 1124 97.9 F (36.6 C)     Temp Source 07/15/18 1124 Oral     SpO2 07/15/18 1124 96 %     Weight 07/15/18 1125 227 lb (103 kg)     Height --      Head Circumference --      Peak Flow --      Pain Score 07/15/18 1125 0     Pain Loc --      Pain Edu? --      Excl. in [place]? --     Constitutional: Alert and oriented. Well appearing and in no acute distress. Eyes: Conjunctivae are normal Head: Atraumatic HEENT: No congestion/rhinnorhea. Mucous membranes are moist.  Oropharynx non-erythematous Neck:   Nontender with no meningismus, no masses, no stridor Cardiovascular: Normal rate, regular rhythm. Grossly normal heart sounds.  Good peripheral circulation. Respiratory: Normal respiratory effort.  No retractions. Lungs CTAB. Abdominal: Soft and nontender. No distention. No guarding no rebound Back:  There is no focal tenderness or step off.  there is no midline tenderness there are no lesions noted. there is no CVA tenderness  Musculoskeletal: No lower  extremity tenderness, no upper extremity tenderness. No joint effusions, no DVT signs strong distal pulses no edema Neurologic:  Normal speech and language. No gross focal neurologic deficits are appreciated.  Skin:  Skin is warm, dry and intact. No rash noted. Psychiatric: Mood and affect are normal. Speech and behavior are normal.  ____________________________________________   LABS (all labs ordered are listed, but only abnormal results are displayed)  Labs Reviewed  BASIC METABOLIC PANEL - Abnormal; Notable for the following components:      Result Value   Calcium 8.8 (*)    All other components within normal limits  CULTURE, BLOOD (ROUTINE X 2)  CULTURE, BLOOD (ROUTINE X 2)  CBC WITH DIFFERENTIAL/PLATELET    Pertinent labs  results that were available during my care of the patient were reviewed by me and considered in my medical decision making (see chart for details). ____________________________________________  EKG  I personally interpreted any EKGs ordered by me or triage  ____________________________________________  RADIOLOGY  Pertinent labs & imaging results that were available during my care of the patient were reviewed by me and considered in my medical decision making (see chart for details). If possible, patient and/or family made aware of any abnormal findings.  No results found. ____________________________________________    PROCEDURES  Procedure(s) performed: None  Procedures  Critical Care performed: None  ____________________________________________   INITIAL IMPRESSION / ASSESSMENT AND PLAN / ED COURSE  Pertinent labs & imaging results that were available during my care of the patient were reviewed by me and considered in my medical decision making (see chart for details).  Patient here with likely contaminant cultures, certainly no evidence of infection he is afebrile here, no white count and work-up yesterday, family does not want to be  here they want to leave at this time, we prevail upon him to wait for our CBC to see if he has an elevated white count and we are repeating cultures.  Do not see an indication to give this gentleman antibiotics given the clinically there is no evidence of infection, we will strongly advised and return for any new or worrisome symptoms including fever etc., and they understand that admission is a possibility but they would very much prefer to go home.  Given this, we will discharge her soon as his CBC comes back.  Assuming the white count is not significantly elevated  ----------------------------------------- 3:00 PM on 07/15/2018 -----------------------------------------  They could not run the CBC is much the patient's irritation, had to be redone, we are going to redraw.  Patient would like to go home.  He like to be called with the blood culture results which we will do.  I seem to be better to stay for the CBC but he would prefer family is very eager that the go home.  he has no evidence of bacteremia clinically.   ____________________________________________   FINAL CLINICAL IMPRESSION(S) / ED DIAGNOSES  Final diagnoses:  None      This chart was dictated using voice recognition software.  Despite best efforts to proofread,  errors can occur which can change meaning.      Schuyler Amor, MD 07/15/18 1445    Schuyler Amor, MD 07/15/18 1500    Schuyler Amor, MD 07/15/18 [id number]

## 2018-07-15 NOTE — ED Notes (Signed)
Pt family requesting if labs aren't done by 3 can the EDP call with the results as he has an appt at 330. EDP aware.

## 2018-07-15 NOTE — Discharge Instructions (Signed)
As we discussed, this is most likely a contaminant and your blood culture, we do not think that you have most likely got bacteria in your blood we are rechecking that.  Of course would prefer to go home which is not unreasonable.  If you have any fevers or chills or feel unwell in any way please return emergently to the emergency room.  Otherwise, follow-up closely with your primary care doctor as an outpatient as already discussed on yesterday's visit.

## 2018-07-15 NOTE — ED Notes (Addendum)
Patient here for abnormal lab work and states he needs abx.  Lab results reviewed, staph in [facility].

## 2018-07-15 NOTE — ED Triage Notes (Signed)
Pt to ED via POV, pt was seen yesterday, pt son states that he was called and told that something abnormal was found on his blood work and he was told to come back. Pt denies any pain at this time. Pt is in NAD.

## 2018-07-16 DIAGNOSIS — R2689 Other abnormalities of gait and mobility: Secondary | ICD-10-CM | POA: Diagnosis not present

## 2018-07-16 DIAGNOSIS — R278 Other lack of coordination: Secondary | ICD-10-CM | POA: Diagnosis not present

## 2018-07-16 DIAGNOSIS — N3941 Urge incontinence: Secondary | ICD-10-CM | POA: Diagnosis not present

## 2018-07-16 DIAGNOSIS — M6281 Muscle weakness (generalized): Secondary | ICD-10-CM | POA: Diagnosis not present

## 2018-07-17 LAB — CULTURE, BLOOD (ROUTINE X 2): Special Requests: ADEQUATE

## 2018-07-18 DIAGNOSIS — R2689 Other abnormalities of gait and mobility: Secondary | ICD-10-CM | POA: Diagnosis not present

## 2018-07-18 DIAGNOSIS — M6281 Muscle weakness (generalized): Secondary | ICD-10-CM | POA: Diagnosis not present

## 2018-07-18 DIAGNOSIS — R278 Other lack of coordination: Secondary | ICD-10-CM | POA: Diagnosis not present

## 2018-07-18 DIAGNOSIS — N3941 Urge incontinence: Secondary | ICD-10-CM | POA: Diagnosis not present

## 2018-07-18 NOTE — Progress Notes (Signed)
Pharmacy Antibiotic Note  Ricardo Erickson is a 77 y.o. male admitted on 07/14/2018 with agitation and dementia.  While he was here a blood culture was drawn, however he was afebrile and WBC were wnl. He was discharged without antibiotics but blood grew coagulase negative staph  Plan: After a discussion and review of the patient's chart with Dr Mable Paris it was determined that this is a contaminant. Treatment is deferred.   Height: 5\' 7"  (170.2 cm) Weight: 227 lb (103 kg) IBW/kg (Calculated) : 66.1  No data recorded.  Recent Labs  Lab 07/14/18 0447 07/14/18 0448 07/15/18 1316 07/15/18 1457  WBC 9.4  --   --  10.4  CREATININE 1.03  --  0.92  --   LATICACIDVEN  --  1.5  --   --     Estimated Creatinine Clearance: 79.4 mL/min (by C-G formula based on SCr of 0.92 mg/dL).    No Known Allergies  Antimicrobials this admission: none  Microbiology results: 8/3 BCx: coagulase negative staph  Thank you for allowing pharmacy to be a part of this patient's care.  Dallie Piles, PharmD 07/18/2018 1:26 PM

## 2018-07-19 DIAGNOSIS — R2689 Other abnormalities of gait and mobility: Secondary | ICD-10-CM | POA: Diagnosis not present

## 2018-07-19 DIAGNOSIS — R278 Other lack of coordination: Secondary | ICD-10-CM | POA: Diagnosis not present

## 2018-07-19 DIAGNOSIS — N3941 Urge incontinence: Secondary | ICD-10-CM | POA: Diagnosis not present

## 2018-07-19 DIAGNOSIS — M6281 Muscle weakness (generalized): Secondary | ICD-10-CM | POA: Diagnosis not present

## 2018-07-19 LAB — CULTURE, BLOOD (ROUTINE X 2)
Culture: NO GROWTH
Special Requests: ADEQUATE

## 2018-07-20 DIAGNOSIS — M6281 Muscle weakness (generalized): Secondary | ICD-10-CM | POA: Diagnosis not present

## 2018-07-20 DIAGNOSIS — R2689 Other abnormalities of gait and mobility: Secondary | ICD-10-CM | POA: Diagnosis not present

## 2018-07-20 DIAGNOSIS — N3941 Urge incontinence: Secondary | ICD-10-CM | POA: Diagnosis not present

## 2018-07-20 DIAGNOSIS — R278 Other lack of coordination: Secondary | ICD-10-CM | POA: Diagnosis not present

## 2018-07-20 LAB — CULTURE, BLOOD (ROUTINE X 2): CULTURE: NO GROWTH

## 2018-07-23 DIAGNOSIS — N3941 Urge incontinence: Secondary | ICD-10-CM | POA: Diagnosis not present

## 2018-07-23 DIAGNOSIS — M6281 Muscle weakness (generalized): Secondary | ICD-10-CM | POA: Diagnosis not present

## 2018-07-23 DIAGNOSIS — R278 Other lack of coordination: Secondary | ICD-10-CM | POA: Diagnosis not present

## 2018-07-23 DIAGNOSIS — R2689 Other abnormalities of gait and mobility: Secondary | ICD-10-CM | POA: Diagnosis not present

## 2018-07-24 ENCOUNTER — Ambulatory Visit
Admission: RE | Admit: 2018-07-24 | Discharge: 2018-07-24 | Disposition: A | Discharge status: Home / Self Care | Payer: Medicare Other | Source: Ambulatory Visit | Attending: Family Medicine | Admitting: Family Medicine

## 2018-07-24 ENCOUNTER — Other Ambulatory Visit: Payer: Self-pay | Admitting: Family Medicine

## 2018-07-24 DIAGNOSIS — R600 Localized edema: Secondary | ICD-10-CM | POA: Insufficient documentation

## 2018-07-24 DIAGNOSIS — N3941 Urge incontinence: Secondary | ICD-10-CM | POA: Diagnosis not present

## 2018-07-24 DIAGNOSIS — M79604 Pain in right leg: Secondary | ICD-10-CM | POA: Diagnosis not present

## 2018-07-24 DIAGNOSIS — R2689 Other abnormalities of gait and mobility: Secondary | ICD-10-CM | POA: Diagnosis not present

## 2018-07-24 DIAGNOSIS — M79605 Pain in left leg: Secondary | ICD-10-CM | POA: Diagnosis not present

## 2018-07-24 DIAGNOSIS — M6281 Muscle weakness (generalized): Secondary | ICD-10-CM | POA: Diagnosis not present

## 2018-07-24 DIAGNOSIS — R278 Other lack of coordination: Secondary | ICD-10-CM | POA: Diagnosis not present

## 2018-07-25 DIAGNOSIS — N3941 Urge incontinence: Secondary | ICD-10-CM | POA: Diagnosis not present

## 2018-07-25 DIAGNOSIS — M6281 Muscle weakness (generalized): Secondary | ICD-10-CM | POA: Diagnosis not present

## 2018-07-25 DIAGNOSIS — R278 Other lack of coordination: Secondary | ICD-10-CM | POA: Diagnosis not present

## 2018-07-25 DIAGNOSIS — R2689 Other abnormalities of gait and mobility: Secondary | ICD-10-CM | POA: Diagnosis not present

## 2018-07-26 DIAGNOSIS — R2689 Other abnormalities of gait and mobility: Secondary | ICD-10-CM | POA: Diagnosis not present

## 2018-07-26 DIAGNOSIS — M6281 Muscle weakness (generalized): Secondary | ICD-10-CM | POA: Diagnosis not present

## 2018-07-26 DIAGNOSIS — N3941 Urge incontinence: Secondary | ICD-10-CM | POA: Diagnosis not present

## 2018-07-26 DIAGNOSIS — R278 Other lack of coordination: Secondary | ICD-10-CM | POA: Diagnosis not present

## 2018-07-28 ENCOUNTER — Telehealth: Payer: Self-pay

## 2018-07-28 NOTE — Telephone Encounter (Signed)
Call pt regarding lung screening per pt son decline to have a scan at this time.

## 2018-07-30 ENCOUNTER — Telehealth: Payer: Self-pay | Admitting: Nurse Practitioner

## 2018-07-30 DIAGNOSIS — M6281 Muscle weakness (generalized): Secondary | ICD-10-CM | POA: Diagnosis not present

## 2018-07-30 DIAGNOSIS — R278 Other lack of coordination: Secondary | ICD-10-CM | POA: Diagnosis not present

## 2018-07-30 DIAGNOSIS — R2689 Other abnormalities of gait and mobility: Secondary | ICD-10-CM | POA: Diagnosis not present

## 2018-07-30 DIAGNOSIS — N3941 Urge incontinence: Secondary | ICD-10-CM | POA: Diagnosis not present

## 2018-08-01 DIAGNOSIS — M6281 Muscle weakness (generalized): Secondary | ICD-10-CM | POA: Diagnosis not present

## 2018-08-01 DIAGNOSIS — N3941 Urge incontinence: Secondary | ICD-10-CM | POA: Diagnosis not present

## 2018-08-01 DIAGNOSIS — R278 Other lack of coordination: Secondary | ICD-10-CM | POA: Diagnosis not present

## 2018-08-01 DIAGNOSIS — R2689 Other abnormalities of gait and mobility: Secondary | ICD-10-CM | POA: Diagnosis not present

## 2018-08-02 DIAGNOSIS — R278 Other lack of coordination: Secondary | ICD-10-CM | POA: Diagnosis not present

## 2018-08-02 DIAGNOSIS — M6281 Muscle weakness (generalized): Secondary | ICD-10-CM | POA: Diagnosis not present

## 2018-08-02 DIAGNOSIS — R2689 Other abnormalities of gait and mobility: Secondary | ICD-10-CM | POA: Diagnosis not present

## 2018-08-02 DIAGNOSIS — N3941 Urge incontinence: Secondary | ICD-10-CM | POA: Diagnosis not present

## 2018-08-03 ENCOUNTER — Ambulatory Visit: Payer: Self-pay | Admitting: Urology

## 2018-08-03 DIAGNOSIS — R278 Other lack of coordination: Secondary | ICD-10-CM | POA: Diagnosis not present

## 2018-08-03 DIAGNOSIS — M6281 Muscle weakness (generalized): Secondary | ICD-10-CM | POA: Diagnosis not present

## 2018-08-03 DIAGNOSIS — N3941 Urge incontinence: Secondary | ICD-10-CM | POA: Diagnosis not present

## 2018-08-03 DIAGNOSIS — R2689 Other abnormalities of gait and mobility: Secondary | ICD-10-CM | POA: Diagnosis not present

## 2018-08-06 DIAGNOSIS — N3941 Urge incontinence: Secondary | ICD-10-CM | POA: Diagnosis not present

## 2018-08-06 DIAGNOSIS — M6281 Muscle weakness (generalized): Secondary | ICD-10-CM | POA: Diagnosis not present

## 2018-08-06 DIAGNOSIS — R2689 Other abnormalities of gait and mobility: Secondary | ICD-10-CM | POA: Diagnosis not present

## 2018-08-06 DIAGNOSIS — R278 Other lack of coordination: Secondary | ICD-10-CM | POA: Diagnosis not present

## 2018-08-07 DIAGNOSIS — M6281 Muscle weakness (generalized): Secondary | ICD-10-CM | POA: Diagnosis not present

## 2018-08-07 DIAGNOSIS — R278 Other lack of coordination: Secondary | ICD-10-CM | POA: Diagnosis not present

## 2018-08-07 DIAGNOSIS — N3941 Urge incontinence: Secondary | ICD-10-CM | POA: Diagnosis not present

## 2018-08-07 DIAGNOSIS — R2689 Other abnormalities of gait and mobility: Secondary | ICD-10-CM | POA: Diagnosis not present

## 2018-08-08 DIAGNOSIS — R2689 Other abnormalities of gait and mobility: Secondary | ICD-10-CM | POA: Diagnosis not present

## 2018-08-08 DIAGNOSIS — N3941 Urge incontinence: Secondary | ICD-10-CM | POA: Diagnosis not present

## 2018-08-08 DIAGNOSIS — R278 Other lack of coordination: Secondary | ICD-10-CM | POA: Diagnosis not present

## 2018-08-08 DIAGNOSIS — M6281 Muscle weakness (generalized): Secondary | ICD-10-CM | POA: Diagnosis not present

## 2018-08-09 DIAGNOSIS — M6281 Muscle weakness (generalized): Secondary | ICD-10-CM | POA: Diagnosis not present

## 2018-08-09 DIAGNOSIS — R278 Other lack of coordination: Secondary | ICD-10-CM | POA: Diagnosis not present

## 2018-08-09 DIAGNOSIS — R2689 Other abnormalities of gait and mobility: Secondary | ICD-10-CM | POA: Diagnosis not present

## 2018-08-09 DIAGNOSIS — N3941 Urge incontinence: Secondary | ICD-10-CM | POA: Diagnosis not present

## 2018-08-10 DIAGNOSIS — R278 Other lack of coordination: Secondary | ICD-10-CM | POA: Diagnosis not present

## 2018-08-10 DIAGNOSIS — M6281 Muscle weakness (generalized): Secondary | ICD-10-CM | POA: Diagnosis not present

## 2018-08-10 DIAGNOSIS — N3941 Urge incontinence: Secondary | ICD-10-CM | POA: Diagnosis not present

## 2018-08-10 DIAGNOSIS — R2689 Other abnormalities of gait and mobility: Secondary | ICD-10-CM | POA: Diagnosis not present

## 2018-08-14 DIAGNOSIS — R296 Repeated falls: Secondary | ICD-10-CM | POA: Diagnosis not present

## 2018-08-14 DIAGNOSIS — M6281 Muscle weakness (generalized): Secondary | ICD-10-CM | POA: Diagnosis not present

## 2018-08-14 DIAGNOSIS — R2689 Other abnormalities of gait and mobility: Secondary | ICD-10-CM | POA: Diagnosis not present

## 2018-08-14 DIAGNOSIS — R278 Other lack of coordination: Secondary | ICD-10-CM | POA: Diagnosis not present

## 2018-08-15 DIAGNOSIS — R296 Repeated falls: Secondary | ICD-10-CM | POA: Diagnosis not present

## 2018-08-15 DIAGNOSIS — R2689 Other abnormalities of gait and mobility: Secondary | ICD-10-CM | POA: Diagnosis not present

## 2018-08-15 DIAGNOSIS — R278 Other lack of coordination: Secondary | ICD-10-CM | POA: Diagnosis not present

## 2018-08-15 DIAGNOSIS — M6281 Muscle weakness (generalized): Secondary | ICD-10-CM | POA: Diagnosis not present

## 2018-08-16 DIAGNOSIS — R296 Repeated falls: Secondary | ICD-10-CM | POA: Diagnosis not present

## 2018-08-16 DIAGNOSIS — R2689 Other abnormalities of gait and mobility: Secondary | ICD-10-CM | POA: Diagnosis not present

## 2018-08-16 DIAGNOSIS — M6281 Muscle weakness (generalized): Secondary | ICD-10-CM | POA: Diagnosis not present

## 2018-08-16 DIAGNOSIS — R278 Other lack of coordination: Secondary | ICD-10-CM | POA: Diagnosis not present

## 2018-08-20 DIAGNOSIS — R278 Other lack of coordination: Secondary | ICD-10-CM | POA: Diagnosis not present

## 2018-08-20 DIAGNOSIS — R2689 Other abnormalities of gait and mobility: Secondary | ICD-10-CM | POA: Diagnosis not present

## 2018-08-20 DIAGNOSIS — M6281 Muscle weakness (generalized): Secondary | ICD-10-CM | POA: Diagnosis not present

## 2018-08-20 DIAGNOSIS — R296 Repeated falls: Secondary | ICD-10-CM | POA: Diagnosis not present

## 2018-08-21 DIAGNOSIS — R278 Other lack of coordination: Secondary | ICD-10-CM | POA: Diagnosis not present

## 2018-08-21 DIAGNOSIS — M6281 Muscle weakness (generalized): Secondary | ICD-10-CM | POA: Diagnosis not present

## 2018-08-21 DIAGNOSIS — R296 Repeated falls: Secondary | ICD-10-CM | POA: Diagnosis not present

## 2018-08-21 DIAGNOSIS — R2689 Other abnormalities of gait and mobility: Secondary | ICD-10-CM | POA: Diagnosis not present

## 2018-08-22 ENCOUNTER — Emergency Department
Admission: EM | Admit: 2018-08-22 | Discharge: 2018-08-22 | Disposition: A | Discharge status: Home / Self Care | Payer: Medicare Other | Attending: Emergency Medicine | Admitting: Emergency Medicine

## 2018-08-22 ENCOUNTER — Emergency Department: Payer: Medicare Other

## 2018-08-22 ENCOUNTER — Other Ambulatory Visit: Payer: Self-pay

## 2018-08-22 ENCOUNTER — Encounter: Payer: Self-pay | Admitting: Emergency Medicine

## 2018-08-22 DIAGNOSIS — Z7901 Long term (current) use of anticoagulants: Secondary | ICD-10-CM | POA: Diagnosis not present

## 2018-08-22 DIAGNOSIS — R0789 Other chest pain: Secondary | ICD-10-CM

## 2018-08-22 DIAGNOSIS — Z86718 Personal history of other venous thrombosis and embolism: Secondary | ICD-10-CM | POA: Insufficient documentation

## 2018-08-22 DIAGNOSIS — J441 Chronic obstructive pulmonary disease with (acute) exacerbation: Secondary | ICD-10-CM | POA: Diagnosis not present

## 2018-08-22 DIAGNOSIS — I509 Heart failure, unspecified: Secondary | ICD-10-CM | POA: Diagnosis not present

## 2018-08-22 DIAGNOSIS — F17210 Nicotine dependence, cigarettes, uncomplicated: Secondary | ICD-10-CM | POA: Insufficient documentation

## 2018-08-22 DIAGNOSIS — I110 Hypertensive heart disease with heart failure: Secondary | ICD-10-CM | POA: Insufficient documentation

## 2018-08-22 DIAGNOSIS — Z79899 Other long term (current) drug therapy: Secondary | ICD-10-CM | POA: Diagnosis not present

## 2018-08-22 DIAGNOSIS — R918 Other nonspecific abnormal finding of lung field: Secondary | ICD-10-CM | POA: Diagnosis not present

## 2018-08-22 DIAGNOSIS — I4891 Unspecified atrial fibrillation: Secondary | ICD-10-CM | POA: Insufficient documentation

## 2018-08-22 DIAGNOSIS — J439 Emphysema, unspecified: Secondary | ICD-10-CM | POA: Diagnosis not present

## 2018-08-22 DIAGNOSIS — R079 Chest pain, unspecified: Secondary | ICD-10-CM | POA: Diagnosis not present

## 2018-08-22 DIAGNOSIS — Z8546 Personal history of malignant neoplasm of prostate: Secondary | ICD-10-CM | POA: Diagnosis not present

## 2018-08-22 LAB — BASIC METABOLIC PANEL
ANION GAP: 7 (ref 5–15)
BUN: 11 mg/dL (ref 8–23)
CALCIUM: 9.2 mg/dL (ref 8.9–10.3)
CO2: 26 mmol/L (ref 22–32)
Chloride: 106 mmol/L (ref 98–111)
Creatinine, Ser: 0.82 mg/dL (ref 0.61–1.24)
GFR calc Af Amer: >60 mL/min (ref >60)
GFR calc non Af Amer: >60 mL/min (ref >60)
Glucose, Bld: 100 mg/dL — ABNORMAL HIGH (ref 70–99)
Potassium: 4.3 mmol/L (ref 3.5–5.1)
Sodium: 139 mmol/L (ref 135–145)

## 2018-08-22 LAB — TROPONIN I
Troponin I: <0.03 ng/mL (ref <0.03)
Troponin I: <0.03 ng/mL (ref <0.03)

## 2018-08-22 LAB — CBC
HCT: 45.5 % (ref 40.0–52.0)
HEMOGLOBIN: 15.2 g/dL (ref 13.0–18.0)
MCH: 30.1 pg (ref 26.0–34.0)
MCHC: 33.3 g/dL (ref 32.0–36.0)
MCV: 90.4 fL (ref 80.0–100.0)
Platelets: 232 10*3/uL (ref 150–440)
RBC: 5.03 MIL/uL (ref 4.40–5.90)
RDW: 14.6 % — ABNORMAL HIGH (ref 11.5–14.5)
WBC: 15.3 10*3/uL — ABNORMAL HIGH (ref 3.8–10.6)

## 2018-08-22 LAB — BRAIN NATRIURETIC PEPTIDE: B Natriuretic Peptide: 115 pg/mL — ABNORMAL HIGH (ref 0.0–100.0)

## 2018-08-22 MED ORDER — METHYLPREDNISOLONE SODIUM SUCC 125 MG IJ SOLR
125.0000 mg | Freq: Once | INTRAMUSCULAR | Status: AC
  Administered 2018-08-22: 125 mg via INTRAVENOUS
  Filled 2018-08-22: qty 2

## 2018-08-22 MED ORDER — ALBUTEROL SULFATE (2.5 MG/3ML) 0.083% IN NEBU
7.5000 mg | INHALATION_SOLUTION | Freq: Once | RESPIRATORY_TRACT | Status: AC
  Administered 2018-08-22: 7.5 mg via RESPIRATORY_TRACT
  Filled 2018-08-22: qty 9

## 2018-08-22 MED ORDER — PREDNISONE 20 MG PO TABS: 40.0000 mg | ORAL_TABLET | Freq: Every day | ORAL | 0 refills | Status: DC

## 2018-08-22 MED ORDER — AZITHROMYCIN 500 MG PO TABS
500.0000 mg | ORAL_TABLET | Freq: Once | ORAL | Status: AC
  Administered 2018-08-22: 500 mg via ORAL

## 2018-08-22 MED ORDER — MAGNESIUM SULFATE 2 GM/50ML IV SOLN
2.0000 g | INTRAVENOUS | Status: AC
  Administered 2018-08-22: 2 g via INTRAVENOUS
  Filled 2018-08-22: qty 50

## 2018-08-22 MED ORDER — AZITHROMYCIN 500 MG PO TABS
ORAL_TABLET | ORAL | Status: AC
  Administered 2018-08-22: 500 mg via ORAL
  Filled 2018-08-22: qty 1

## 2018-08-22 MED ORDER — AZITHROMYCIN 250 MG PO TABS: ORAL_TABLET | ORAL | 0 refills | Status: DC

## 2018-08-22 NOTE — ED Notes (Signed)
Called EMS and spoke with Jarrett Soho.

## 2018-08-22 NOTE — ED Provider Notes (Signed)
[facility] Emergency Department Provider Note  ____________________________________________  Time seen: Approximately 11:03 PM  I have reviewed the triage vital signs and the nursing notes.   HISTORY  Chief Complaint Chest Pain    HPI Ricardo Erickson is a 77 y.o. male with a history of CHF, COPD, hypertension, PE who complains of  chest pain that started at about 3 AM this morning.  Not exertional, not pleuritic.  Not associated with diaphoresis or vomiting.  Compliant with medications.  He has shortness of breath but it is chronic and at baseline.  Peripheral edema is at baseline.  No other acute complaints.     Past Medical History:  Diagnosis Date  . CHF (congestive heart failure) ([place])   . Chronic back pain   . Dementia   . Depression   . Emphysema lung ([place])   . Hypertension   . Personal history of tobacco use, presenting hazards to health 08/06/2015  . Pollen allergies   . PTSD (post-traumatic stress disorder)   . Pulmonary embolism [facility])      Patient Active Problem List   Diagnosis Date Noted  . Pulmonary emboli ([place]) 11/18/2017  . Tobacco use disorder 07/24/2017  . Encephalopathy   . Palliative care encounter   . Goals of care, counseling/discussion   . Pulmonary embolus ([place])   . UTI (urinary tract infection) 07/19/2017  . PTSD (post-traumatic stress disorder) 06/28/2017  . Adjustment disorder with anxiety 06/28/2017  . Closed compression fracture of L5 lumbar vertebra   . Left low back pain   . Weakness   . A-fib ([place]) 06/24/2017  . Prediabetes 01/18/2017  . Prostate cancer ([place]) 01/18/2017  . Anxiety 01/18/2017  . Preventative health care 04/15/2016  . Restless leg syndrome 04/03/2016  . COPD (chronic obstructive pulmonary disease) ([place]) 04/03/2016  . BPH (benign prostatic hyperplasia) 04/03/2016  . Personal history of tobacco use, presenting hazards to health 08/06/2015     Past Surgical History:  Procedure Laterality  Date  . BACK SURGERY    . KYPHOPLASTY N/A 06/27/2017   Procedure: KYPHOPLASTY -L5;  Surgeon: Hessie Knows, MD;  Location: [facility] ORS;  Service: Orthopedics;  Laterality: N/A;  . [place]     Prior to Admission medications   Medication Sig Start Date End Date Taking? Authorizing Provider  acetaminophen (TYLENOL) 650 MG CR tablet Take 650 mg by mouth every 8 (eight) hours as needed for pain.    [provider]  ADVAIR DISKUS 250-50 MCG/DOSE AEPB Inhale 1 puff into the lungs every 12 (twelve) hours. 04/09/18   [provider]  albuterol (PROVENTIL HFA;VENTOLIN HFA) 108 (90 Base) MCG/ACT inhaler Inhale 2 puffs into the lungs as needed for wheezing or shortness of breath.    [provider]  albuterol (PROVENTIL) (2.5 MG/3ML) 0.083% nebulizer solution Inhale 2.5 mg into the lungs every 6 (six) hours as needed for wheezing or shortness of breath.  01/15/15   [provider]  apixaban (ELIQUIS) 5 MG TABS tablet Take 1 tablet (5 mg total) by mouth 2 (two) times daily. 11/28/17   Gladstone Lighter, MD  azithromycin (ZITHROMAX Z-PAK) 250 MG tablet Take 2 tablets (500 mg) on  Day 1,  followed by 1 tablet (250 mg) once daily on Days 2 through 5. 08/22/18   Carrie Mew, MD  docusate sodium (COLACE) 100 MG capsule Take 1 capsule (100 mg total) by mouth 2 (two) times daily. 11/22/17   Gladstone Lighter, MD  feeding supplement,  ENSURE ENLIVE, (ENSURE ENLIVE) LIQD Take 237 mLs by mouth 3 (three) times daily between meals. 11/22/17   Gladstone Lighter, MD  finasteride (PROSCAR) 5 MG tablet Take 1 tablet (5 mg total) by mouth daily. 02/02/18   Festus Aloe, MD  FLUoxetine (PROZAC) 10 MG tablet Take 1 tablet (10 mg total) by mouth every evening. 07/14/18 09/12/18  Arta Silence, MD  furosemide (LASIX) 20 MG tablet Take 20 mg by mouth daily. 06/29/18   [provider]  lidocaine (LIDODERM) 5 % Place 1 patch onto the skin daily. Remove  & Discard patch within 12 hours or as directed by MD Patient not taking: Reported on 07/14/2018 07/27/17   Henreitta Leber, MD  metoprolol succinate (TOPROL-XL) 50 MG 24 hr tablet Take 50 mg by mouth daily. Take with or immediately following a meal.    [provider]  OLANZapine (ZYPREXA) 2.5 MG tablet Take 0.5 tablets (1.25 mg total) by mouth at bedtime. 07/14/18 08/13/18  Arta Silence, MD  oxyCODONE (ROXICODONE) 5 MG immediate release tablet Take 1-2 tablets (5-10 mg total) by mouth every 6 (six) hours as needed for moderate pain or severe pain. Patient not taking: Reported on 07/14/2018 11/22/17   Gladstone Lighter, MD  polyethylene glycol [facility] Floria Raveling) packet Take 17 g by mouth daily as needed for mild constipation. 11/22/17   Gladstone Lighter, MD  pramipexole (MIRAPEX) 1 MG tablet Take 1 tablet (1 mg total) by mouth at bedtime. 11/22/17   Gladstone Lighter, MD  predniSONE (DELTASONE) 20 MG tablet Take 2 tablets (40 mg total) by mouth daily. 08/22/18   Carrie Mew, MD  QUEtiapine (SEROQUEL) 25 MG tablet Take 1 tablet (25 mg total) by mouth at bedtime. 07/27/17   Henreitta Leber, MD  SPIRIVA HANDIHALER 18 MCG inhalation capsule Place 18 mcg into inhaler and inhale daily. 06/11/18   [provider]  tamsulosin (FLOMAX) 0.4 MG CAPS capsule Take 2 capsules (0.8 mg total) by mouth daily after supper. 02/02/18   Festus Aloe, MD  traMADol (ULTRAM) 50 MG tablet Take 1 tablet (50 mg total) by mouth every 6 (six) hours as needed. Patient taking differently: Take 50 mg by mouth 2 (two) times daily.  03/07/18   Lisa Roca, MD     Allergies Patient has no known allergies.   Family History  Problem Relation Age of Onset  . Alcohol abuse Father   . Arthritis Mother   . Heart disease Maternal Grandmother   . Prostate cancer Brother     Social History Social History   Tobacco Use  . Smoking status: Current Every Day Smoker    Packs/day: 1.00    Years:  60.00    Pack years: 60.00    Types: Cigarettes  . Smokeless tobacco: Never Used  Substance Use Topics  . Alcohol use: No    Alcohol/week: 0.0 standard drinks  . Drug use: No    Review of Systems  Constitutional:   No fever or chills.  ENT:   No sore throat. No rhinorrhea. Cardiovascular:   Positive as above chest pain without syncope. Respiratory:   Chronic shortness of breath, at baseline.  No new cough. Gastrointestinal:   Negative for abdominal pain, vomiting and diarrhea.  Musculoskeletal:   Chronic peripheral edema, at baseline All other systems reviewed and are negative except as documented above in ROS and HPI.  ____________________________________________   PHYSICAL EXAM:  VITAL SIGNS: ED Triage Vitals  Enc Vitals Group     BP 08/22/18 1812 Marland Kitchen)  125/95     Pulse Rate 08/22/18 1812 (!) 104     Resp 08/22/18 1812 (!) 27     Temp 08/22/18 1812 98.7 F (37.1 C)     Temp Source 08/22/18 1812 Oral     SpO2 08/22/18 1812 97 %     Weight 08/22/18 1808 200 lb (90.7 kg)     Height 08/22/18 1808 5\' 9"  (1.753 m)     Head Circumference --      Peak Flow --      Pain Score 08/22/18 1808 3     Pain Loc --      Pain Edu? --      Excl. in [place]? --     Vital signs reviewed, nursing assessments reviewed.   Constitutional:   Alert and oriented. Non-toxic appearance. Eyes:   Conjunctivae are normal. EOMI. PERRL. ENT      Head:   Normocephalic and atraumatic.      Nose:   No congestion/rhinnorhea.       Mouth/Throat:   MMM, no pharyngeal erythema. No peritonsillar mass.       Neck:   No meningismus. Full ROM. Hematological/Lymphatic/Immunilogical:   No cervical lymphadenopathy. Cardiovascular:   Irregularly irregular rhythm, rate 100- 110. Symmetric bilateral radial and DP pulses.  No murmurs. Cap refill less than 2 seconds. Respiratory: Tachypnea.  Diffuse expiratory wheezing with prolonged respiratory phase.  Bilateral basilar crackles.  Good air entry in all lung  fields. Gastrointestinal:   Soft and nontender. Non distended. There is no CVA tenderness.  No rebound, rigidity, or guarding. Musculoskeletal:   Normal range of motion in all extremities. No joint effusions.  No lower extremity tenderness.  1+ pitting edema bilateral, symmetric.  No tenderness. Neurologic:   Normal speech and language.  Motor grossly intact. No acute focal neurologic deficits are appreciated.  Skin:    Skin is warm, dry and intact. No rash noted.  No petechiae, purpura, or bullae.  ____________________________________________    LABS (pertinent positives/negatives) (all labs ordered are listed, but only abnormal results are displayed) Labs Reviewed  BASIC METABOLIC PANEL - Abnormal; Notable for the following components:      Result Value   Glucose, Bld 100 (*)    All other components within normal limits  CBC - Abnormal; Notable for the following components:   WBC 15.3 (*)    RDW 14.6 (*)    All other components within normal limits  BRAIN NATRIURETIC PEPTIDE - Abnormal; Notable for the following components:   B Natriuretic Peptide 115.0 (*)    All other components within normal limits  TROPONIN I  TROPONIN I   ____________________________________________   EKG  Interpreted by me Atrial fibrillation rate of 97, right axis, normal intervals.  Right bundle branch block.  No acute ischemic changes.  ____________________________________________    RADIOLOGY  Dg Chest 2 View  Result Date: 08/22/2018 CLINICAL DATA:  Chest pain starting this morning EXAM: CHEST - 2 VIEW COMPARISON:  July 03, 2018 FINDINGS: The heart size and mediastinal contours are within normal limits. Mild patchy opacities identified in the left lung base and the medial right lung base. There is no frank pulmonary edema or pleural effusion. The visualized skeletal structures are unremarkable. IMPRESSION: Mild patchy opacity in both lung bases although may be in part due to atelectasis,  underlying pneumonia is not excluded. Electronically Signed   By: Abelardo Diesel M.D.   On: 08/22/2018 18:59   Ct Chest Wo Contrast  Result Date:  08/22/2018 CLINICAL DATA:  Chest pain EXAM: CT CHEST WITHOUT CONTRAST TECHNIQUE: Multidetector CT imaging of the chest was performed following the standard protocol without IV contrast. COMPARISON:  11/18/2017 FINDINGS: Cardiovascular: No evidence of ascending aneurysm or acute intramural hematoma. Atherosclerotic calcifications of the aortic arch and descending thoracic aorta as well as at the origin of the right subclavian artery. Mild 3 vessel coronary artery calcifications. Mediastinum/Nodes: No abnormal mediastinal adenopathy. Visualized thyroid is unremarkable. No pericardial effusion. Physiologic pericardial fluid is noted. Unremarkable esophagus. Lungs/Pleura: No pneumothorax or pleural effusion. Moderate centrilobular emphysema dependent atelectasis. Subsegmental atelectasis in the lingula. Upper Abdomen: Stable liver hypodensities compared with a CT dated 06/24/2017 Musculoskeletal: There are multiple healing left rib fractures. Fracture lines are still very visible with some callus formation. Severe L1 compression fracture compatible with vertebra plana is new compare with 06/24/2017. IMPRESSION: No acute cardiopulmonary process. Atherosclerotic disease as described. Emphysema. New L1 severe compression fracture.  Exact age is indeterminate. Aortic Atherosclerosis (ICD10-I70.0) and Emphysema (ICD10-J43.9). Electronically Signed   By: Marybelle Killings M.D.   On: 08/22/2018 20:53    ____________________________________________   PROCEDURES Procedures  ____________________________________________  DIFFERENTIAL DIAGNOSIS   Pneumonia, pneumothorax, COPD exacerbation, non-STEMI  CLINICAL IMPRESSION / ASSESSMENT AND PLAN / ED COURSE  Pertinent labs & imaging results that were available during my care of the patient were reviewed by me and considered in  my medical decision making (see chart for details).    Patient presents with atypical chest pain and wheezing on exam, consistent with COPD exacerbation.  Chest x-ray is nondiagnostic, does not show pneumonia or pneumothorax but with basilar crackles and possible infiltrate at the bases CT scan of the chest was obtained without contrast to evaluate for occult pneumonia or edema.  The CT scan is unremarkable without infiltrate.  No dense consolidation.  Patient feels better after receiving Solu-Medrol and albuterol.  Remains rate controlled with his atrial fibrillation feels better and is eating and drinking.  Suitable for discharge home, azithromycin, short course prednisone, follow-up with primary care.  2 troponins are negative.  Considering the patient's symptoms, medical history, and physical examination today, I have low suspicion for ACS, PE, TAD, pneumothorax, carditis, mediastinitis, pneumonia, CHF, or sepsis.        ____________________________________________   FINAL CLINICAL IMPRESSION(S) / ED DIAGNOSES    Final diagnoses:  Atypical chest pain  COPD exacerbation [facility])     ED Discharge Orders         Ordered    azithromycin (ZITHROMAX Z-PAK) 250 MG tablet     08/22/18 2303    predniSONE (DELTASONE) 20 MG tablet  Daily     08/22/18 2303          Portions of this note were generated with dragon dictation software. Dictation errors may occur despite best attempts at proofreading.    Carrie Mew, MD 08/22/18 2311

## 2018-08-22 NOTE — ED Notes (Signed)
Pts brief changed  

## 2018-08-22 NOTE — ED Notes (Signed)
Patient transported to CT 

## 2018-08-22 NOTE — ED Triage Notes (Signed)
Pt presents to ED via EMS with c/o CP that started at approx 0230 this morning. Pt presents from [facility], states CP worsened this afternoon, hx of CHF and A-fib. Per EMS pt has 1+ swelling to bilateral ankles and hx of SOB with walking at baseline.

## 2018-08-22 NOTE — ED Notes (Signed)
Patient transported to X-ray 

## 2018-08-22 NOTE — ED Notes (Signed)
Pt c/o CP 4/10; per pt pain is located in middle of chest. Upon assessment this RN noticed pt seems to be SOB while talking. Pt is on 2L SpO2 at 98%.

## 2018-08-27 DIAGNOSIS — R296 Repeated falls: Secondary | ICD-10-CM | POA: Diagnosis not present

## 2018-08-27 DIAGNOSIS — R278 Other lack of coordination: Secondary | ICD-10-CM | POA: Diagnosis not present

## 2018-08-27 DIAGNOSIS — M6281 Muscle weakness (generalized): Secondary | ICD-10-CM | POA: Diagnosis not present

## 2018-08-27 DIAGNOSIS — R2689 Other abnormalities of gait and mobility: Secondary | ICD-10-CM | POA: Diagnosis not present

## 2018-08-28 ENCOUNTER — Encounter: Payer: Self-pay | Admitting: Urology

## 2018-08-28 ENCOUNTER — Ambulatory Visit (INDEPENDENT_AMBULATORY_CARE_PROVIDER_SITE_OTHER): Payer: Medicare Other | Admitting: Urology

## 2018-08-28 VITALS — BP 203/62 | HR 99 | Resp 18 | Ht 69.0 in | Wt 201.4 lb

## 2018-08-28 DIAGNOSIS — N401 Enlarged prostate with lower urinary tract symptoms: Secondary | ICD-10-CM | POA: Diagnosis not present

## 2018-08-28 DIAGNOSIS — R3914 Feeling of incomplete bladder emptying: Secondary | ICD-10-CM

## 2018-08-28 NOTE — Progress Notes (Addendum)
   08/28/2018 12:52 PM   Ricardo Erickson 22-Jan-1941 [id number]  Reason for visit: Follow up BPH/LUTS  HPI: I had the pleasure of seeing Ricardo Erickson in urology clinic today for BPH/lites.  He was previously followed by Dr. Junious Silk and Dr. Pilar Jarvis for hematuria, BPH, incomplete emptying.  He is a comorbid and frail-appearing 77 year old male with history of pulmonary embolus in July 2018 who remains on apixaban, as well as a history of congestive heart heart failure.  He is on maximal medical therapy with finasteride and Flomax.  CT scan for hematuria work-up showed a 115 g prostate, and cystoscopy showed large lateral lobes and a 5 cm length prostate.  He feels he is emptying his bladder well, though does complain of some frequency and urgency.  He occasionally has leakage of urine however it seems this is primarily because he is not very mobile and does not make it to the bathroom in time using his walker.  He also has a difficult time identifying when he is full.  Renal function is normal with a serum creatinine of 0.82.  He has a history of elevated PSA, and his most recent value is 6.7 on finasteride in February 2019.   ROS: Please see flowsheet from today's date for complete review of systems.  Physical Exam: BP (!) 203/62 (BP Location: Left Arm, Patient Position: Sitting, Cuff Size: Normal) Comment: notified MD  Pulse 99   Resp 18   Ht 5\' 9"  (1.753 m)   Wt 201 lb 6.4 oz (91.4 kg)   BMI 29.74 kg/m    Constitutional:  Alert and oriented Respiratory: Normal respiratory effort, no increased work of breathing. GI: Abdomen is soft, nontender, nondistended, no abdominal masses GU: No CVA tenderness Skin: No rashes, bruises or suspicious lesions. Neurologic: Grossly intact, no focal deficits, moving all 4 extremities. Psychiatric: Anxious  Assessment & Plan:   In summary, Ricardo Erickson is a 77 year old male with BPH/lots, chronic elevated PSA, likely impaired sensation, and multiple  medical problems including history of pulmonary embolus July 2018 on apixaban, and congestive heart failure.  Overall, he feels he is stable regarding his lower urinary tract symptoms.  Regarding his elevated PSA, I do not recommend biopsy in the setting of his multiple comorbidities and anticoagulation.  Regarding his lower urinary tract symptoms, with his 115 g prostate I would lean toward HOLEP for surgical intervention.  However, the fact that he has difficulty with sensation and some urgency and urge incontinence I do not recommend intervention with HOLEP at this time, as the symptoms may get worse.  Most importantly, he is a very high risk surgical candidate with his history of pulmonary embolus, as well as the fact that he had an MI on the table during a recent prior back surgery.  The patient and his son are in agreement with continued medical management.    He will follow-up in 6 months with a uroflow/PVR and renal ultrasound to evaluate for any upper tract hydronephrosis.  Return in about 6 months (around 02/26/2019) for uroflow/PVR, renal ultrasound.   Greater than 25 minutes were spent in face-to-face counseling and education with the patient and his son regarding BPH/LUTS, elevated PSA, and surgical options for outlet obstruction.  [place], [facility] [address] [place],  [address] [phone]

## 2018-08-29 DIAGNOSIS — M6281 Muscle weakness (generalized): Secondary | ICD-10-CM | POA: Diagnosis not present

## 2018-08-29 DIAGNOSIS — R296 Repeated falls: Secondary | ICD-10-CM | POA: Diagnosis not present

## 2018-08-29 DIAGNOSIS — R2689 Other abnormalities of gait and mobility: Secondary | ICD-10-CM | POA: Diagnosis not present

## 2018-08-29 DIAGNOSIS — R278 Other lack of coordination: Secondary | ICD-10-CM | POA: Diagnosis not present

## 2018-09-03 DIAGNOSIS — R296 Repeated falls: Secondary | ICD-10-CM | POA: Diagnosis not present

## 2018-09-03 DIAGNOSIS — R2689 Other abnormalities of gait and mobility: Secondary | ICD-10-CM | POA: Diagnosis not present

## 2018-09-03 DIAGNOSIS — M6281 Muscle weakness (generalized): Secondary | ICD-10-CM | POA: Diagnosis not present

## 2018-09-03 DIAGNOSIS — R278 Other lack of coordination: Secondary | ICD-10-CM | POA: Diagnosis not present

## 2018-09-05 DIAGNOSIS — M6281 Muscle weakness (generalized): Secondary | ICD-10-CM | POA: Diagnosis not present

## 2018-09-05 DIAGNOSIS — R2689 Other abnormalities of gait and mobility: Secondary | ICD-10-CM | POA: Diagnosis not present

## 2018-09-05 DIAGNOSIS — R296 Repeated falls: Secondary | ICD-10-CM | POA: Diagnosis not present

## 2018-09-05 DIAGNOSIS — R278 Other lack of coordination: Secondary | ICD-10-CM | POA: Diagnosis not present

## 2018-09-07 DIAGNOSIS — B0223 Postherpetic polyneuropathy: Secondary | ICD-10-CM | POA: Diagnosis not present

## 2018-09-10 DIAGNOSIS — R296 Repeated falls: Secondary | ICD-10-CM | POA: Diagnosis not present

## 2018-09-10 DIAGNOSIS — R278 Other lack of coordination: Secondary | ICD-10-CM | POA: Diagnosis not present

## 2018-09-10 DIAGNOSIS — R2689 Other abnormalities of gait and mobility: Secondary | ICD-10-CM | POA: Diagnosis not present

## 2018-09-10 DIAGNOSIS — M6281 Muscle weakness (generalized): Secondary | ICD-10-CM | POA: Diagnosis not present

## 2018-09-12 DIAGNOSIS — R296 Repeated falls: Secondary | ICD-10-CM | POA: Diagnosis not present

## 2018-09-12 DIAGNOSIS — M6281 Muscle weakness (generalized): Secondary | ICD-10-CM | POA: Diagnosis not present

## 2018-09-12 DIAGNOSIS — R278 Other lack of coordination: Secondary | ICD-10-CM | POA: Diagnosis not present

## 2018-09-12 DIAGNOSIS — R2689 Other abnormalities of gait and mobility: Secondary | ICD-10-CM | POA: Diagnosis not present

## 2018-09-17 DIAGNOSIS — R278 Other lack of coordination: Secondary | ICD-10-CM | POA: Diagnosis not present

## 2018-09-17 DIAGNOSIS — M6281 Muscle weakness (generalized): Secondary | ICD-10-CM | POA: Diagnosis not present

## 2018-09-17 DIAGNOSIS — R296 Repeated falls: Secondary | ICD-10-CM | POA: Diagnosis not present

## 2018-09-17 DIAGNOSIS — R2689 Other abnormalities of gait and mobility: Secondary | ICD-10-CM | POA: Diagnosis not present

## 2018-09-19 DIAGNOSIS — R2689 Other abnormalities of gait and mobility: Secondary | ICD-10-CM | POA: Diagnosis not present

## 2018-09-19 DIAGNOSIS — M6281 Muscle weakness (generalized): Secondary | ICD-10-CM | POA: Diagnosis not present

## 2018-09-19 DIAGNOSIS — R278 Other lack of coordination: Secondary | ICD-10-CM | POA: Diagnosis not present

## 2018-09-19 DIAGNOSIS — R296 Repeated falls: Secondary | ICD-10-CM | POA: Diagnosis not present

## 2018-09-24 DIAGNOSIS — R296 Repeated falls: Secondary | ICD-10-CM | POA: Diagnosis not present

## 2018-09-24 DIAGNOSIS — R2689 Other abnormalities of gait and mobility: Secondary | ICD-10-CM | POA: Diagnosis not present

## 2018-09-24 DIAGNOSIS — R278 Other lack of coordination: Secondary | ICD-10-CM | POA: Diagnosis not present

## 2018-09-24 DIAGNOSIS — M6281 Muscle weakness (generalized): Secondary | ICD-10-CM | POA: Diagnosis not present

## 2018-09-26 DIAGNOSIS — M6281 Muscle weakness (generalized): Secondary | ICD-10-CM | POA: Diagnosis not present

## 2018-09-26 DIAGNOSIS — R296 Repeated falls: Secondary | ICD-10-CM | POA: Diagnosis not present

## 2018-09-26 DIAGNOSIS — R2689 Other abnormalities of gait and mobility: Secondary | ICD-10-CM | POA: Diagnosis not present

## 2018-09-26 DIAGNOSIS — R278 Other lack of coordination: Secondary | ICD-10-CM | POA: Diagnosis not present

## 2018-10-01 DIAGNOSIS — R2689 Other abnormalities of gait and mobility: Secondary | ICD-10-CM | POA: Diagnosis not present

## 2018-10-01 DIAGNOSIS — M6281 Muscle weakness (generalized): Secondary | ICD-10-CM | POA: Diagnosis not present

## 2018-10-01 DIAGNOSIS — R296 Repeated falls: Secondary | ICD-10-CM | POA: Diagnosis not present

## 2018-10-01 DIAGNOSIS — R278 Other lack of coordination: Secondary | ICD-10-CM | POA: Diagnosis not present

## 2018-10-04 DIAGNOSIS — R296 Repeated falls: Secondary | ICD-10-CM | POA: Diagnosis not present

## 2018-10-04 DIAGNOSIS — R278 Other lack of coordination: Secondary | ICD-10-CM | POA: Diagnosis not present

## 2018-10-04 DIAGNOSIS — M6281 Muscle weakness (generalized): Secondary | ICD-10-CM | POA: Diagnosis not present

## 2018-10-04 DIAGNOSIS — R2689 Other abnormalities of gait and mobility: Secondary | ICD-10-CM | POA: Diagnosis not present

## 2018-11-22 DIAGNOSIS — S32020D Wedge compression fracture of second lumbar vertebra, subsequent encounter for fracture with routine healing: Secondary | ICD-10-CM | POA: Diagnosis not present

## 2018-11-22 DIAGNOSIS — Z9889 Other specified postprocedural states: Secondary | ICD-10-CM | POA: Diagnosis not present

## 2018-11-27 ENCOUNTER — Emergency Department: Payer: Medicare Other

## 2018-11-27 ENCOUNTER — Other Ambulatory Visit: Payer: Self-pay

## 2018-11-27 ENCOUNTER — Inpatient Hospital Stay
Admission: EM | Admit: 2018-11-27 | Discharge: 2018-11-28 | DRG: 190 | Disposition: A | Discharge status: Home Health | Payer: Medicare Other | Attending: Internal Medicine | Admitting: Internal Medicine

## 2018-11-27 DIAGNOSIS — R0602 Shortness of breath: Secondary | ICD-10-CM | POA: Diagnosis not present

## 2018-11-27 DIAGNOSIS — Z7951 Long term (current) use of inhaled steroids: Secondary | ICD-10-CM | POA: Diagnosis not present

## 2018-11-27 DIAGNOSIS — N400 Enlarged prostate without lower urinary tract symptoms: Secondary | ICD-10-CM | POA: Diagnosis present

## 2018-11-27 DIAGNOSIS — M549 Dorsalgia, unspecified: Secondary | ICD-10-CM | POA: Diagnosis not present

## 2018-11-27 DIAGNOSIS — J189 Pneumonia, unspecified organism: Secondary | ICD-10-CM | POA: Diagnosis present

## 2018-11-27 DIAGNOSIS — Z79899 Other long term (current) drug therapy: Secondary | ICD-10-CM | POA: Diagnosis not present

## 2018-11-27 DIAGNOSIS — J441 Chronic obstructive pulmonary disease with (acute) exacerbation: Secondary | ICD-10-CM | POA: Diagnosis not present

## 2018-11-27 DIAGNOSIS — M545 Low back pain: Secondary | ICD-10-CM | POA: Diagnosis not present

## 2018-11-27 DIAGNOSIS — Z7901 Long term (current) use of anticoagulants: Secondary | ICD-10-CM | POA: Diagnosis not present

## 2018-11-27 DIAGNOSIS — G8929 Other chronic pain: Secondary | ICD-10-CM | POA: Diagnosis present

## 2018-11-27 DIAGNOSIS — I110 Hypertensive heart disease with heart failure: Secondary | ICD-10-CM | POA: Diagnosis present

## 2018-11-27 DIAGNOSIS — Z515 Encounter for palliative care: Secondary | ICD-10-CM | POA: Diagnosis present

## 2018-11-27 DIAGNOSIS — J440 Chronic obstructive pulmonary disease with acute lower respiratory infection: Secondary | ICD-10-CM | POA: Diagnosis not present

## 2018-11-27 DIAGNOSIS — Z8546 Personal history of malignant neoplasm of prostate: Secondary | ICD-10-CM | POA: Diagnosis not present

## 2018-11-27 DIAGNOSIS — Z86711 Personal history of pulmonary embolism: Secondary | ICD-10-CM | POA: Diagnosis not present

## 2018-11-27 DIAGNOSIS — F329 Major depressive disorder, single episode, unspecified: Secondary | ICD-10-CM | POA: Diagnosis not present

## 2018-11-27 DIAGNOSIS — F0390 Unspecified dementia without behavioral disturbance: Secondary | ICD-10-CM | POA: Diagnosis not present

## 2018-11-27 DIAGNOSIS — Z8249 Family history of ischemic heart disease and other diseases of the circulatory system: Secondary | ICD-10-CM | POA: Diagnosis not present

## 2018-11-27 DIAGNOSIS — I509 Heart failure, unspecified: Secondary | ICD-10-CM | POA: Diagnosis present

## 2018-11-27 DIAGNOSIS — F17210 Nicotine dependence, cigarettes, uncomplicated: Secondary | ICD-10-CM | POA: Diagnosis present

## 2018-11-27 DIAGNOSIS — R0603 Acute respiratory distress: Secondary | ICD-10-CM | POA: Diagnosis present

## 2018-11-27 DIAGNOSIS — R05 Cough: Secondary | ICD-10-CM | POA: Diagnosis not present

## 2018-11-27 LAB — CBC
HCT: 51 % (ref 39.0–52.0)
Hemoglobin: 16.3 g/dL (ref 13.0–17.0)
MCH: 29.7 pg (ref 26.0–34.0)
MCHC: 32 g/dL (ref 30.0–36.0)
MCV: 93.1 fL (ref 80.0–100.0)
PLATELETS: 265 10*3/uL (ref 150–400)
RBC: 5.48 MIL/uL (ref 4.22–5.81)
RDW: 14.8 % (ref 11.5–15.5)
WBC: 12.3 10*3/uL — ABNORMAL HIGH (ref 4.0–10.5)
nRBC: 0 % (ref 0.0–0.2)

## 2018-11-27 LAB — POCT I-STAT, CHEM 8
BUN: 18 mg/dL (ref 8–23)
Calcium, Ion: 1.17 mmol/L (ref 1.15–1.40)
Chloride: 105 mmol/L (ref 98–111)
Creatinine, Ser: 1.1 mg/dL (ref 0.61–1.24)
Glucose, Bld: 127 mg/dL — ABNORMAL HIGH (ref 70–99)
HCT: 49 % (ref 39.0–52.0)
Hemoglobin: 16.7 g/dL (ref 13.0–17.0)
Potassium: 3.7 mmol/L (ref 3.5–5.1)
Sodium: 141 mmol/L (ref 135–145)
TCO2: 30 mmol/L (ref 22–32)

## 2018-11-27 LAB — BLOOD GAS, VENOUS
ACID-BASE EXCESS: 2.5 mmol/L — AB (ref 0.0–2.0)
Bicarbonate: 29.3 mmol/L — ABNORMAL HIGH (ref 20.0–28.0)
O2 Saturation: 71.9 %
PCO2 VEN: 53 mmHg (ref 44.0–60.0)
Patient temperature: 37
pH, Ven: 7.35 (ref 7.250–7.430)
pO2, Ven: 40 mmHg (ref 32.0–45.0)

## 2018-11-27 LAB — BASIC METABOLIC PANEL
Anion gap: 8 (ref 5–15)
BUN: 17 mg/dL (ref 8–23)
CALCIUM: 9 mg/dL (ref 8.9–10.3)
CO2: 27 mmol/L (ref 22–32)
CREATININE: 1.14 mg/dL (ref 0.61–1.24)
Chloride: 106 mmol/L (ref 98–111)
GFR calc Af Amer: >60 mL/min (ref >60)
GFR calc non Af Amer: >60 mL/min (ref >60)
GLUCOSE: 122 mg/dL — AB (ref 70–99)
Potassium: 3.9 mmol/L (ref 3.5–5.1)
Sodium: 141 mmol/L (ref 135–145)

## 2018-11-27 LAB — TROPONIN I: Troponin I: <0.03 ng/mL (ref <0.03)

## 2018-11-27 MED ORDER — ACETAMINOPHEN 650 MG RE SUPP: 650.0000 mg | Freq: Four times a day (QID) | RECTAL | Status: DC | PRN

## 2018-11-27 MED ORDER — IPRATROPIUM-ALBUTEROL 0.5-2.5 (3) MG/3ML IN SOLN
3.0000 mL | Freq: Once | RESPIRATORY_TRACT | Status: AC
  Administered 2018-11-27: 3 mL via RESPIRATORY_TRACT

## 2018-11-27 MED ORDER — SODIUM CHLORIDE 0.9 % IV SOLN
1.0000 g | Freq: Once | INTRAVENOUS | Status: AC
  Administered 2018-11-27: 1 g via INTRAVENOUS
  Filled 2018-11-27: qty 10

## 2018-11-27 MED ORDER — ONDANSETRON HCL 4 MG PO TABS: 4.0000 mg | ORAL_TABLET | Freq: Four times a day (QID) | ORAL | Status: DC | PRN

## 2018-11-27 MED ORDER — FLUOXETINE HCL 10 MG PO CAPS
10.0000 mg | ORAL_CAPSULE | Freq: Every day | ORAL | Status: DC
  Administered 2018-11-27 – 2018-11-28 (×2): 10 mg via ORAL
  Filled 2018-11-27 (×2): qty 1

## 2018-11-27 MED ORDER — SODIUM CHLORIDE 0.9 % IV SOLN
500.0000 mg | Freq: Once | INTRAVENOUS | Status: AC
  Administered 2018-11-27: 500 mg via INTRAVENOUS
  Filled 2018-11-27: qty 500

## 2018-11-27 MED ORDER — ACETAMINOPHEN 325 MG PO TABS: 650.0000 mg | ORAL_TABLET | Freq: Four times a day (QID) | ORAL | Status: DC | PRN

## 2018-11-27 MED ORDER — TRAMADOL HCL 50 MG PO TABS
50.0000 mg | ORAL_TABLET | Freq: Four times a day (QID) | ORAL | Status: DC | PRN
  Administered 2018-11-27: 50 mg via ORAL
  Filled 2018-11-27: qty 1

## 2018-11-27 MED ORDER — METOPROLOL SUCCINATE ER 50 MG PO TB24
50.0000 mg | ORAL_TABLET | Freq: Every day | ORAL | Status: DC
  Administered 2018-11-27 – 2018-11-28 (×2): 50 mg via ORAL
  Filled 2018-11-27 (×2): qty 1

## 2018-11-27 MED ORDER — IPRATROPIUM-ALBUTEROL 0.5-2.5 (3) MG/3ML IN SOLN
3.0000 mL | RESPIRATORY_TRACT | Status: DC
  Administered 2018-11-27 – 2018-11-28 (×8): 3 mL via RESPIRATORY_TRACT
  Filled 2018-11-27 (×6): qty 3

## 2018-11-27 MED ORDER — METHYLPREDNISOLONE SODIUM SUCC 125 MG IJ SOLR
125.0000 mg | Freq: Once | INTRAMUSCULAR | Status: AC
  Administered 2018-11-27: 125 mg via INTRAVENOUS

## 2018-11-27 MED ORDER — FINASTERIDE 5 MG PO TABS
5.0000 mg | ORAL_TABLET | Freq: Every day | ORAL | Status: DC
  Administered 2018-11-27 – 2018-11-28 (×2): 5 mg via ORAL
  Filled 2018-11-27 (×2): qty 1

## 2018-11-27 MED ORDER — DOCUSATE SODIUM 100 MG PO CAPS
100.0000 mg | ORAL_CAPSULE | Freq: Two times a day (BID) | ORAL | Status: DC
  Administered 2018-11-27 – 2018-11-28 (×3): 100 mg via ORAL
  Filled 2018-11-27 (×3): qty 1

## 2018-11-27 MED ORDER — GUAIFENESIN ER 600 MG PO TB12
600.0000 mg | ORAL_TABLET | Freq: Two times a day (BID) | ORAL | Status: DC
  Administered 2018-11-27 – 2018-11-28 (×3): 600 mg via ORAL
  Filled 2018-11-27 (×3): qty 1

## 2018-11-27 MED ORDER — POLYETHYLENE GLYCOL 3350 17 G PO PACK: 17.0000 g | PACK | Freq: Every day | ORAL | Status: DC | PRN

## 2018-11-27 MED ORDER — APIXABAN 5 MG PO TABS
5.0000 mg | ORAL_TABLET | Freq: Two times a day (BID) | ORAL | Status: DC
  Administered 2018-11-27 – 2018-11-28 (×3): 5 mg via ORAL
  Filled 2018-11-27 (×3): qty 1

## 2018-11-27 MED ORDER — METHYLPREDNISOLONE SODIUM SUCC 125 MG IJ SOLR
60.0000 mg | INTRAMUSCULAR | Status: DC
  Administered 2018-11-27 – 2018-11-28 (×2): 60 mg via INTRAVENOUS
  Filled 2018-11-27 (×2): qty 2

## 2018-11-27 MED ORDER — MOMETASONE FURO-FORMOTEROL FUM 200-5 MCG/ACT IN AERO
2.0000 | INHALATION_SPRAY | Freq: Two times a day (BID) | RESPIRATORY_TRACT | Status: DC
  Administered 2018-11-27 – 2018-11-28 (×3): 2 via RESPIRATORY_TRACT
  Filled 2018-11-27: qty 8.8

## 2018-11-27 MED ORDER — SODIUM CHLORIDE 0.9 % IV SOLN
1.0000 g | INTRAVENOUS | Status: DC
  Administered 2018-11-27: 1 g via INTRAVENOUS
  Filled 2018-11-27 (×2): qty 10

## 2018-11-27 MED ORDER — AZITHROMYCIN 500 MG PO TABS
500.0000 mg | ORAL_TABLET | Freq: Every day | ORAL | Status: DC
  Administered 2018-11-28: 500 mg via ORAL
  Filled 2018-11-27: qty 2
  Filled 2018-11-27: qty 1

## 2018-11-27 MED ORDER — FUROSEMIDE 40 MG PO TABS
20.0000 mg | ORAL_TABLET | ORAL | Status: DC
  Administered 2018-11-27 – 2018-11-28 (×2): 20 mg via ORAL
  Filled 2018-11-27 (×2): qty 1

## 2018-11-27 MED ORDER — LIDOCAINE 5 % EX PTCH
1.0000 | MEDICATED_PATCH | CUTANEOUS | Status: DC
  Administered 2018-11-27 – 2018-11-28 (×2): 1 via TRANSDERMAL
  Filled 2018-11-27 (×2): qty 1

## 2018-11-27 MED ORDER — METHYLPREDNISOLONE SODIUM SUCC 125 MG IJ SOLR
INTRAMUSCULAR | Status: AC
  Administered 2018-11-27: 125 mg via INTRAVENOUS
  Filled 2018-11-27: qty 2

## 2018-11-27 MED ORDER — IPRATROPIUM-ALBUTEROL 0.5-2.5 (3) MG/3ML IN SOLN
RESPIRATORY_TRACT | Status: AC
  Filled 2018-11-27: qty 9

## 2018-11-27 MED ORDER — OLANZAPINE 2.5 MG PO TABS
1.2500 mg | ORAL_TABLET | Freq: Every day | ORAL | Status: DC
  Administered 2018-11-27: 1.25 mg via ORAL
  Filled 2018-11-27 (×2): qty 0.5

## 2018-11-27 MED ORDER — PRAMIPEXOLE DIHYDROCHLORIDE 1 MG PO TABS
1.0000 mg | ORAL_TABLET | Freq: Every day | ORAL | Status: DC
  Administered 2018-11-27: 1 mg via ORAL
  Filled 2018-11-27 (×2): qty 1

## 2018-11-27 MED ORDER — TAMSULOSIN HCL 0.4 MG PO CAPS
0.8000 mg | ORAL_CAPSULE | Freq: Every day | ORAL | Status: DC
  Administered 2018-11-27: 0.8 mg via ORAL
  Filled 2018-11-27: qty 2

## 2018-11-27 MED ORDER — ONDANSETRON HCL 4 MG/2ML IJ SOLN: 4.0000 mg | Freq: Four times a day (QID) | INTRAMUSCULAR | Status: DC | PRN

## 2018-11-27 NOTE — H&P (Signed)
[facility] NAME: Ricardo Erickson    MR#:  [id number]  DATE OF BIRTH:  [birth year]  DATE OF ADMISSION:  11/27/2018  PRIMARY CARE PHYSICIAN: Rusty Aus, MD   REQUESTING/REFERRING PHYSICIAN: Dr. Lavonia Drafts  CHIEF COMPLAINT:   Chief Complaint  Patient presents with  . Back Pain  . Respiratory Distress    HISTORY OF PRESENT ILLNESS:  Ricardo Erickson  is a 77 y.o. male with a known history of PE on Eliquis, COPD with ongoing smoking, mild dementia, chronic low back pain from vertebral fractures who is from an independent living facility presents to hospital secondary to worsening shortness of breath and low back pain. Patient is a poor historian due to his dementia.  Most of the history is obtained from the ER nurse and the patient's son at bedside.  Patient has a caregiver to sleep with him at nighttime, apparently in the middle of the night patient complained of low back pain and then started having trouble breathing and was brought to the hospital.  His oxygen saturations were not low, however he was placed on BiPAP due to increased work of breathing.  According to son patient can get hyperventilated and increased work of breathing when he gets anxious from pain.  He also has history of sundowning and delirium while in the hospital in the past. Chest x-ray showed left lower lobe pneumonia.  He is being admitted for the same.  Also has COPD exacerbation.  PAST MEDICAL HISTORY:   Past Medical History:  Diagnosis Date  . CHF (congestive heart failure) ([place])   . Chronic back pain   . Dementia ([place])   . Depression   . Emphysema lung ([place])   . Hypertension   . Personal history of tobacco use, presenting hazards to health 08/06/2015  . Pollen allergies   . PTSD (post-traumatic stress disorder)   . Pulmonary embolism ([place])     PAST SURGICAL HISTORY:   Past Surgical History:  Procedure Laterality Date  . BACK SURGERY    .  KYPHOPLASTY N/A 06/27/2017   Procedure: KYPHOPLASTY -L5;  Surgeon: Hessie Knows, MD;  Location: [facility] ORS;  Service: Orthopedics;  Laterality: N/A;  . TONSILLECTOMY AND ADENOIDECTOMY  1980    SOCIAL HISTORY:   Social History   Tobacco Use  . Smoking status: Current Every Day Smoker    Packs/day: 0.50    Years: 60.00    Pack years: 30.00    Types: Cigarettes  . Smokeless tobacco: Never Used  Substance Use Topics  . Alcohol use: No    Alcohol/week: 0.0 standard drinks    FAMILY HISTORY:   Family History  Problem Relation Age of Onset  . Alcohol abuse Father   . Arthritis Mother   . Heart disease Maternal Grandmother   . Prostate cancer Brother     DRUG ALLERGIES:  No Known Allergies  REVIEW OF SYSTEMS:   Review of Systems  Constitutional: Positive for malaise/fatigue. Negative for chills, fever and weight loss.  HENT: Negative for ear discharge, ear pain, hearing loss and nosebleeds.   Eyes: Negative for blurred vision, double vision and photophobia.  Respiratory: Positive for shortness of breath. Negative for cough, hemoptysis and wheezing.   Cardiovascular: Negative for chest pain, palpitations, orthopnea and leg swelling.  Gastrointestinal: Negative for abdominal pain, constipation, diarrhea, melena, nausea and vomiting.  Genitourinary: Negative for dysuria, frequency and urgency.  Musculoskeletal: Positive for back pain. Negative for myalgias and  neck pain.  Skin: Negative for rash.  Neurological: Negative for dizziness, tingling, sensory change, speech change, focal weakness and headaches.  Endo/Heme/Allergies: Does not bruise/bleed easily.  Psychiatric/Behavioral: Negative for depression.    MEDICATIONS AT HOME:   Prior to Admission medications   Medication Sig Start Date End Date Taking? Authorizing Provider  acetaminophen (TYLENOL) 650 MG CR tablet Take 650 mg by mouth every 8 (eight) hours as needed for pain.   Yes [provider]  ADVAIR DISKUS  250-50 MCG/DOSE AEPB Inhale 1 puff into the lungs every 12 (twelve) hours. 04/09/18  Yes [provider]  albuterol (PROVENTIL HFA;VENTOLIN HFA) 108 (90 Base) MCG/ACT inhaler Inhale 2 puffs into the lungs every 4 (four) hours as needed for wheezing or shortness of breath.    Yes [provider]  albuterol (PROVENTIL) (2.5 MG/3ML) 0.083% nebulizer solution Inhale 2.5 mg into the lungs 4 (four) times daily.  01/15/15  Yes [provider]  apixaban (ELIQUIS) 5 MG TABS tablet Take 1 tablet (5 mg total) by mouth 2 (two) times daily. 11/28/17  Yes Gladstone Lighter, MD  finasteride (PROSCAR) 5 MG tablet Take 1 tablet (5 mg total) by mouth daily. 02/02/18  Yes Festus Aloe, MD  FLUoxetine (PROZAC) 10 MG capsule Take 10 mg by mouth daily.   Yes [provider]  furosemide (LASIX) 20 MG tablet Take 20 mg by mouth every morning.  06/29/18  Yes [provider]  metoprolol succinate (TOPROL-XL) 50 MG 24 hr tablet Take 50 mg by mouth daily. Take with or immediately following a meal.   Yes [provider]  OLANZapine (ZYPREXA) 2.5 MG tablet Take 1.25 mg by mouth at bedtime.   Yes [provider]  potassium chloride (K-DUR) 10 MEQ tablet Take 10 mEq by mouth daily. 06/27/18 06/27/19 Yes [provider]  pramipexole (MIRAPEX) 1 MG tablet Take 1 tablet (1 mg total) by mouth at bedtime. 11/22/17  Yes Gladstone Lighter, MD  predniSONE (DELTASONE) 20 MG tablet Take 2 tablets (40 mg total) by mouth daily. 08/22/18  Yes Carrie Mew, MD  SPIRIVA HANDIHALER 18 MCG inhalation capsule Place 18 mcg into inhaler and inhale daily. 06/11/18  Yes [provider]  tamsulosin (FLOMAX) 0.4 MG CAPS capsule Take 2 capsules (0.8 mg total) by mouth daily after supper. 02/02/18  Yes Festus Aloe, MD  traMADol (ULTRAM) 50 MG tablet Take 1 tablet (50 mg total) by mouth every 6 (six) hours as needed. Patient taking differently: Take 50 mg by mouth 2 (two)  times daily.  03/07/18  Yes Lisa Roca, MD  azithromycin (ZITHROMAX Z-PAK) 250 MG tablet Take 2 tablets (500 mg) on  Day 1,  followed by 1 tablet (250 mg) once daily on Days 2 through 5. Patient not taking: Reported on 11/27/2018 08/22/18   Carrie Mew, MD  docusate sodium (COLACE) 100 MG capsule Take 1 capsule (100 mg total) by mouth 2 (two) times daily. Patient not taking: Reported on 11/27/2018 11/22/17   Gladstone Lighter, MD  feeding supplement, ENSURE ENLIVE, (ENSURE ENLIVE) LIQD Take 237 mLs by mouth 3 (three) times daily between meals. Patient not taking: Reported on 11/27/2018 11/22/17   Gladstone Lighter, MD  FLUoxetine (PROZAC) 10 MG tablet Take 1 tablet (10 mg total) by mouth every evening. 07/14/18 09/12/18  Arta Silence, MD  lidocaine (LIDODERM) 5 % Place 1 patch onto the skin daily. Remove & Discard patch within 12 hours or as directed by MD Patient not taking: Reported on 07/14/2018 07/27/17  Henreitta Leber, MD  OLANZapine (ZYPREXA) 2.5 MG tablet Take 0.5 tablets (1.25 mg total) by mouth at bedtime. 07/14/18 08/13/18  Arta Silence, MD  oxyCODONE (ROXICODONE) 5 MG immediate release tablet Take 1-2 tablets (5-10 mg total) by mouth every 6 (six) hours as needed for moderate pain or severe pain. Patient not taking: Reported on 07/14/2018 11/22/17   Gladstone Lighter, MD  polyethylene glycol [facility] Floria [facility]) packet Take 17 g by mouth daily as needed for mild constipation. Patient not taking: Reported on 11/27/2018 11/22/17   Gladstone Lighter, MD  predniSONE (STERAPRED UNI-PAK 21 TAB) 10 MG (21) TBPK tablet Take 10-60 mg by mouth daily. Taper dose 6,5,4,3,2,1 11/26/18   [provider]  QUEtiapine (SEROQUEL) 25 MG tablet Take 1 tablet (25 mg total) by mouth at bedtime. Patient not taking: Reported on 11/27/2018 07/27/17   Henreitta Leber, MD      VITAL SIGNS:  Blood pressure 132/89, pulse 84, resp. rate 18, height 5\' 11"  (1.803 m), SpO2 98 %.  PHYSICAL  EXAMINATION:   Physical Exam  GENERAL:  77 y.o.-year-old patient lying in the bed with no acute distress.  EYES: Pupils equal, round, reactive to light and accommodation. No scleral icterus. Extraocular muscles intact.  HEENT: Head atraumatic, normocephalic. Oropharynx and nasopharynx clear.  NECK:  Supple, no jugular venous distention. No thyroid enlargement, no tenderness.  LUNGS: Clear bilaterally but diffuse expiratory wheeze noted, no rales,rhonchi or crepitation. No use of accessory muscles of respiration.  CARDIOVASCULAR: S1, S2 normal. No rubs, or gallops. 2/6 systolic murmur present ABDOMEN: Soft, nontender, nondistended. Bowel sounds present. No organomegaly or mass.  EXTREMITIES: No pedal edema, cyanosis, or clubbing.  NEUROLOGIC: Cranial nerves II through XII are intact. Muscle strength 5/5 in all extremities. Sensation intact. Gait not checked.  PSYCHIATRIC: The patient is alert and oriented x 3.  SKIN: No obvious rash, lesion, or ulcer.   LABORATORY PANEL:   CBC Recent Labs  Lab 11/27/18 0443 11/27/18 0500  WBC 12.3*  --   HGB 16.3 16.7  HCT 51.0 49.0  PLT 265  --    ------------------------------------------------------------------------------------------------------------------  Chemistries  Recent Labs  Lab 11/27/18 0443 11/27/18 0500  NA 141 141  K 3.9 3.7  CL 106 105  CO2 27  --   GLUCOSE 122* 127*  BUN 17 18  CREATININE 1.14 1.10  CALCIUM 9.0  --    ------------------------------------------------------------------------------------------------------------------  Cardiac Enzymes Recent Labs  Lab 11/27/18 0443  TROPONINI <0.03   ------------------------------------------------------------------------------------------------------------------  RADIOLOGY:  Dg Chest Port 1 View  Result Date: 11/27/2018 CLINICAL DATA:  Dyspnea. Cough. EXAM: PORTABLE CHEST 1 VIEW COMPARISON:  Radiographs and CT 08/22/2018 FINDINGS: Patient is rotated. Increasing  opacity with volume loss loss at the left lung base. Mild streaky right basilar atelectasis. Emphysema with associated bronchial thickening. No pneumothorax or large pleural effusion. No acute osseous abnormalities. IMPRESSION: 1. Rotated exam. Increasing opacity at the left lung base with volume loss may be atelectasis, pneumonia or aspiration. 2. Emphysema with associated bronchial thickening. Streaky right infrahilar atelectasis. Electronically Signed   By: Keith Rake M.D.   On: 11/27/2018 06:18    EKG:   Orders placed or performed during the hospital encounter of 11/27/18  . ED EKG  . ED EKG    IMPRESSION AND PLAN:   Naithen Rivenburg  is a 77 y.o. 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  1.  Acute respiratory distress-secondary to COPD exacerbation and also left lower lobe pneumonia -Off BiPAP, currently on nasal cannula.  Wean as tolerated as not on home oxygen -Continue IV steroids, nebs and inhalers -Counseled against smoking. -Started on Rocephin and azithromycin for community-acquired pneumonia.  2.  Low back pain-has chronic low back pain.  History of L1 kyphoplasty. -Pain medication as tolerated.  Lidoderm patch ordered for now  3.  Tobacco use disorder-counseled, will start nicotine patch.  4.  History of PE-continue Eliquis  5.  BPH-follows with urologist as outpatient.  Continue Proscar and high-dose Flomax  6.  Dementia-pleasantly confused, seems to be at baseline.  7.  DVT prophylaxis-on Eliquis    All the records are reviewed and case discussed with ED provider. Management plans discussed with the patient, family and they are in agreement.  CODE STATUS: Full code  TOTAL TIME TAKING CARE OF THIS PATIENT: 50 minutes.    Gladstone Lighter M.D  on 11/27/2018 at 2:59 PM  Between 7am to 6pm - Pager - [phone]  After 6pm go to [email or web address] - password [email or web address] [facility]  Office  [phone]  CC: Primary care physician; Rusty Aus, MD

## 2018-11-27 NOTE — Progress Notes (Signed)
RT called to patient bedside for Bipap. Bipap placed on patient per MD verbal order. 12/6, 12RR, 50%, SVN placed in line for breathing treatment, treatment given per MD order. Will continue to monitor.

## 2018-11-27 NOTE — ED Notes (Signed)
Patient taken off of [place]. 95% on room air. Patient on room air at baseline

## 2018-11-27 NOTE — ED Triage Notes (Signed)
Pt to STAT desk via w/c brought in by caregiver; pt with grunting respirations; st diff breathing, nonprod cough x 4 days; also reports lower back pain x 2 days accomp by nausea; st "I think I have a kidney stone"; admits to hx prostate issues; charge nurse called and pt taken immed to room 19; placed in hosp gown & on card monitor; pt smells of cigarette smoke

## 2018-11-27 NOTE — ED Notes (Signed)
Patient given breakfast tray. Sitting up eating. Family at bedside. [place] decreased to 2L

## 2018-11-27 NOTE — ED Notes (Signed)
Dr Owens Shark to bedside with care nurse Mallie Mussel RN; RT paged for bipap

## 2018-11-27 NOTE — ED Notes (Signed)
bipap in place by RT; nebs admin

## 2018-11-27 NOTE — ED Notes (Signed)
ED TO INPATIENT HANDOFF REPORT  Name/Age/Gender Ricardo Erickson 77 y.o. male  Code Status Code Status History    Date Active Date Inactive Code Status Order ID Comments User Context   11/18/2017 1245 11/22/2017 2031 DNR [id number]  Hillary Bow, MD Inpatient   11/18/2017 0854 11/18/2017 1245 Full Code [id number]  Hillary Bow, MD ED   07/21/2017 1308 07/28/2017 1900 DNR [id number]  Max Sane, MD Inpatient   07/20/2017 0058 07/21/2017 1308 Full Code [id number]  Lance Coon, MD Inpatient   06/24/2017 2235 06/30/2017 0312 Full Code [id number]  Fritzi Mandes, MD Inpatient    Questions for Most Recent Historical Code Status (Order [id number])    Question Answer Comment   In the event of cardiac or respiratory ARREST Do not call a "code blue"    In the event of cardiac or respiratory ARREST Do not perform Intubation, CPR, defibrillation or ACLS    In the event of cardiac or respiratory ARREST Use medication by any route, position, wound care, and other measures to relive pain and suffering. May use oxygen, suction and manual treatment of airway obstruction as needed for comfort.       Home/SNF/Other [facility]  Chief Complaint back pain, diff breathing  Level of Care/Admitting Diagnosis ED Disposition    ED Disposition Condition [facility]: [place] [[id number]]  Level of Care: Med-Surg [16]  Diagnosis: Pneumonia [976734]  Admitting Physician: Gladstone Lighter [[id number]]  Attending Physician: Gladstone Lighter [[id number]]  Estimated length of stay: past midnight tomorrow  Certification:: I certify this patient will need inpatient services for at least 2 midnights  PT Class (Do Not Modify): Inpatient [101]  PT Acc Code (Do Not Modify): Private [1]       Medical History Past Medical History:  Diagnosis Date  . CHF (congestive heart failure) ([place])   . Chronic back pain   . Dementia   . Depression   . Emphysema lung ([place])   .  Hypertension   . Personal history of tobacco use, presenting hazards to health 08/06/2015  . Pollen allergies   . PTSD (post-traumatic stress disorder)   . Pulmonary embolism ([place])     Allergies No Known Allergies  IV Location/Drains/Wounds Patient Lines/Drains/Airways Status   Active Line/Drains/Airways    Name:   Placement date:   Placement time:   Site:   Days:   Peripheral IV 11/27/18 Left Wrist   11/27/18    0450    Wrist   less than 1   Peripheral IV 11/27/18 Right Hand   11/27/18    0811    Hand   less than 1   Incision (Closed) 07/23/17 Back   07/23/17    1900     492          Labs/Imaging Results for orders placed or performed during the hospital encounter of 11/27/18 (from the past 48 hour(s))  Basic metabolic panel     Status: Abnormal   Collection Time: 11/27/18  4:43 AM  Result Value Ref Range   Sodium 141 135 - 145 mmol/L   Potassium 3.9 3.5 - 5.1 mmol/L   Chloride 106 98 - 111 mmol/L   CO2 27 22 - 32 mmol/L   Glucose, Bld 122 (H) 70 - 99 mg/dL   BUN 17 8 - 23 mg/dL   Creatinine, Ser 1.14 0.61 - 1.24 mg/dL   Calcium 9.0 8.9 - 10.3 mg/dL   GFR calc non Af Amer >  60 >60 mL/min   GFR calc Af Amer >60 >60 mL/min   Anion gap 8 5 - 15    Comment: Performed at [facility], [address] [place], [place] [address]  CBC     Status: Abnormal   Collection Time: 11/27/18  4:43 AM  Result Value Ref Range   WBC 12.3 (H) 4.0 - 10.5 K/uL   RBC 5.48 4.22 - 5.81 MIL/uL   Hemoglobin 16.3 13.0 - 17.0 g/dL   HCT 51.0 39.0 - 52.0 %   MCV 93.1 80.0 - 100.0 fL   MCH 29.7 26.0 - 34.0 pg   MCHC 32.0 30.0 - 36.0 g/dL   RDW 14.8 11.5 - 15.5 %   Platelets 265 150 - 400 K/uL   nRBC 0.0 0.0 - 0.2 %    Comment: Performed at [facility], [address] [place], [place] [address]  Troponin I - Once     Status: None   Collection Time: 11/27/18  4:43 AM  Result Value Ref Range   Troponin I <0.03 <0.03 ng/mL    Comment: Performed at [facility], [address] [place], [place] [address]  Blood gas, venous     Status: Abnormal   Collection Time: 11/27/18  4:59 AM  Result Value Ref Range   pH, Ven 7.35 7.250 - 7.430   pCO2, Ven 53 44.0 - 60.0 mmHg   pO2, Ven 40.0 32.0 - 45.0 mmHg   Bicarbonate 29.3 (H) 20.0 - 28.0 mmol/L   Acid-Base Excess 2.5 (H) 0.0 - 2.0 mmol/L   O2 Saturation 71.9 %   Patient temperature 37.0    Collection site VEIN    Sample type VENOUS     Comment: Performed at [facility], [address] [place], [place] [address]  I-STAT, [place] 8     Status: Abnormal   Collection Time: 11/27/18  5:00 AM  Result Value Ref Range   Sodium 141 135 - 145 mmol/L   Potassium 3.7 3.5 - 5.1 mmol/L   Chloride 105 98 - 111 mmol/L   BUN 18 8 - 23 mg/dL   Creatinine, Ser 1.10 0.61 - 1.24 mg/dL   Glucose, Bld 127 (H) 70 - 99 mg/dL   Calcium, Ion 1.17 1.15 - 1.40 mmol/L   TCO2 30 22 - 32 mmol/L   Hemoglobin 16.7 13.0 - 17.0 g/dL   HCT 49.0 39.0 - 52.0 %   Dg Chest Port 1 View  Result Date: 11/27/2018 CLINICAL DATA:  Dyspnea. Cough. EXAM: PORTABLE CHEST 1 VIEW COMPARISON:  Radiographs and CT 08/22/2018 FINDINGS: Patient is rotated. Increasing opacity with volume loss loss at the left lung base. Mild streaky right basilar atelectasis. Emphysema with associated bronchial thickening. No pneumothorax or large pleural effusion. No acute osseous abnormalities. IMPRESSION: 1. Rotated exam. Increasing opacity at the left lung base with volume loss may be atelectasis, pneumonia or aspiration. 2. Emphysema with associated bronchial thickening. Streaky right infrahilar atelectasis. Electronically Signed   By: Keith Rake M.D.   On: 11/27/2018 06:18    Pending Labs [employer] (From admission, onward)    Start     Ordered   Signed and Occupational hygienist morning,   R     Signed and Held   Signed and Held  CBC  Tomorrow morning,   R     Signed and Held          Vitals/Pain Today's Vitals   11/27/18  0730 11/27/18 0800 11/27/18 0830 11/27/18 0900  BP: 111/79 (!) 124/91 (!) 120/93 113/90  Pulse: 87 97 90 97  Resp: 18 (!) 24 15 (!) 21  SpO2: 100% 100% 96% 96%  PainSc:        Isolation Precautions No active isolations  Medications Medications  methylPREDNISolone sodium succinate (SOLU-MEDROL) 125 mg/2 mL injection 125 mg (125 mg Intravenous Given 11/27/18 0457)  ipratropium-albuterol (DUONEB) 0.5-2.5 (3) MG/3ML nebulizer solution 3 mL (3 mLs Nebulization Given 11/27/18 0457)  ipratropium-albuterol (DUONEB) 0.5-2.5 (3) MG/3ML nebulizer solution 3 mL ( Nebulization Not Given 11/27/18 0459)  ipratropium-albuterol (DUONEB) 0.5-2.5 (3) MG/3ML nebulizer solution 3 mL (3 mLs Nebulization Given 11/27/18 0457)  cefTRIAXone (ROCEPHIN) 1 g in sodium chloride 0.9 % 100 mL IVPB ( Intravenous Stopped 11/27/18 0842)  azithromycin (ZITHROMAX) 500 mg in sodium chloride 0.9 % 250 mL IVPB ( Intravenous Stopped 11/27/18 0925)    Mobility Ambulatory with walker

## 2018-11-27 NOTE — ED Provider Notes (Signed)
[facility] Emergency Department Provider Note   None    (approximate)  I have reviewed the triage vital signs and the nursing notes.  Level 5 caveat: H&P limited secondary to respiratory distress HISTORY  Chief Complaint Back Pain and Respiratory Distress    HPI Ricardo Erickson is a 77 y.o. male with below list of chronic medical conditions including COPD and CHF presents to the emergency department with rest of dyspnea x3 days with acute worsening in the last 4 hours.  Patient presents to the emergency department in evident respiratory distress speaking in 3 word phrases.   Past Medical History:  Diagnosis Date  . CHF (congestive heart failure) ([place])   . Chronic back pain   . Dementia   . Depression   . Emphysema lung ([place])   . Hypertension   . Personal history of tobacco use, presenting hazards to health 08/06/2015  . Pollen allergies   . PTSD (post-traumatic stress disorder)   . Pulmonary embolism [facility])     Patient Active Problem List   Diagnosis Date Noted  . Pulmonary emboli ([place]) 11/18/2017  . Tobacco use disorder 07/24/2017  . Encephalopathy   . Palliative care encounter   . Goals of care, counseling/discussion   . Pulmonary embolus ([place])   . UTI (urinary tract infection) 07/19/2017  . PTSD (post-traumatic stress disorder) 06/28/2017  . Adjustment disorder with anxiety 06/28/2017  . Closed compression fracture of L5 lumbar vertebra   . Left low back pain   . Weakness   . A-fib ([place]) 06/24/2017  . Prediabetes 01/18/2017  . Prostate cancer ([place]) 01/18/2017  . Anxiety 01/18/2017  . Preventative health care 04/15/2016  . Restless leg syndrome 04/03/2016  . COPD (chronic obstructive pulmonary disease) ([place]) 04/03/2016  . BPH (benign prostatic hyperplasia) 04/03/2016  . Personal history of tobacco use, presenting hazards to health 08/06/2015    Past Surgical History:  Procedure Laterality Date  . BACK SURGERY    . KYPHOPLASTY N/A  06/27/2017   Procedure: KYPHOPLASTY -L5;  Surgeon: Hessie Knows, MD;  Location: [facility] ORS;  Service: Orthopedics;  Laterality: N/A;  . [place]    Prior to Admission medications   Medication Sig Start Date End Date Taking? Authorizing Provider  acetaminophen (TYLENOL) 650 MG CR tablet Take 650 mg by mouth every 8 (eight) hours as needed for pain.   Yes [provider]  ADVAIR DISKUS 250-50 MCG/DOSE AEPB Inhale 1 puff into the lungs every 12 (twelve) hours. 04/09/18  Yes [provider]  albuterol (PROVENTIL HFA;VENTOLIN HFA) 108 (90 Base) MCG/ACT inhaler Inhale 2 puffs into the lungs every 4 (four) hours as needed for wheezing or shortness of breath.    Yes [provider]  albuterol (PROVENTIL) (2.5 MG/3ML) 0.083% nebulizer solution Inhale 2.5 mg into the lungs 4 (four) times daily.  01/15/15  Yes [provider]  apixaban (ELIQUIS) 5 MG TABS tablet Take 1 tablet (5 mg total) by mouth 2 (two) times daily. 11/28/17  Yes Gladstone Lighter, MD  finasteride (PROSCAR) 5 MG tablet Take 1 tablet (5 mg total) by mouth daily. 02/02/18  Yes Festus Aloe, MD  FLUoxetine (PROZAC) 10 MG capsule Take 10 mg by mouth daily.   Yes [provider]  furosemide (LASIX) 20 MG tablet Take 20 mg by mouth 2 (two) times daily.  06/29/18  Yes [provider]  metoprolol succinate (TOPROL-XL) 50 MG 24 hr tablet Take 50 mg by mouth daily. Take  with or immediately following a meal.   Yes [provider]  OLANZapine (ZYPREXA) 2.5 MG tablet Take 1.25 mg by mouth at bedtime.   Yes [provider]  potassium chloride (K-DUR) 10 MEQ tablet Take 10 mEq by mouth daily. 06/27/18 06/27/19 Yes [provider]  pramipexole (MIRAPEX) 1 MG tablet Take 1 tablet (1 mg total) by mouth at bedtime. 11/22/17  Yes [place]stone Lighter, MD  predniSONE (STERAPRED UNI-PAK 21 TAB) 10 MG (21) TBPK tablet Take 10-60 mg by mouth daily. Taper dose  6,5,4,3,2,1 11/26/18  Yes [provider]  SPIRIVA HANDIHALER 18 MCG inhalation capsule Place 18 mcg into inhaler and inhale daily. 06/11/18  Yes [provider]  tamsulosin (FLOMAX) 0.4 MG CAPS capsule Take 2 capsules (0.8 mg total) by mouth daily after supper. 02/02/18  Yes Festus Aloe, MD  traMADol (ULTRAM) 50 MG tablet Take 1 tablet (50 mg total) by mouth every 6 (six) hours as needed. Patient taking differently: Take 50 mg by mouth 2 (two) times daily.  03/07/18  Yes Lisa Roca, MD  azithromycin (ZITHROMAX Z-PAK) 250 MG tablet Take 2 tablets (500 mg) on  Day 1,  followed by 1 tablet (250 mg) once daily on Days 2 through 5. Patient not taking: Reported on 11/27/2018 08/22/18   Carrie Mew, MD  docusate sodium (COLACE) 100 MG capsule Take 1 capsule (100 mg total) by mouth 2 (two) times daily. Patient not taking: Reported on 11/27/2018 11/22/17   Gladstone Lighter, MD  feeding supplement, ENSURE ENLIVE, (ENSURE ENLIVE) LIQD Take 237 mLs by mouth 3 (three) times daily between meals. Patient not taking: Reported on 11/27/2018 11/22/17   Gladstone Lighter, MD  FLUoxetine (PROZAC) 10 MG tablet Take 1 tablet (10 mg total) by mouth every evening. 07/14/18 09/12/18  Arta Silence, MD  lidocaine (LIDODERM) 5 % Place 1 patch onto the skin daily. Remove & Discard patch within 12 hours or as directed by MD Patient not taking: Reported on 07/14/2018 07/27/17   Henreitta Leber, MD  OLANZapine (ZYPREXA) 2.5 MG tablet Take 0.5 tablets (1.25 mg total) by mouth at bedtime. 07/14/18 08/13/18  Arta Silence, MD  oxyCODONE (ROXICODONE) 5 MG immediate release tablet Take 1-2 tablets (5-10 mg total) by mouth every 6 (six) hours as needed for moderate pain or severe pain. Patient not taking: Reported on 07/14/2018 11/22/17   Gladstone Lighter, MD  polyethylene glycol [facility] Floria Raveling) packet Take 17 g by mouth daily as needed for mild constipation. Patient not taking: Reported on  11/27/2018 11/22/17   Gladstone Lighter, MD  predniSONE (DELTASONE) 20 MG tablet Take 2 tablets (40 mg total) by mouth daily. Patient not taking: Reported on 11/27/2018 08/22/18   Carrie Mew, MD  QUEtiapine (SEROQUEL) 25 MG tablet Take 1 tablet (25 mg total) by mouth at bedtime. Patient not taking: Reported on 11/27/2018 07/27/17   Henreitta Leber, MD    Allergies Patient has no known allergies.  Family History  Problem Relation Age of Onset  . Alcohol abuse Father   . Arthritis Mother   . Heart disease Maternal Grandmother   . Prostate cancer Brother     Social History Social History   Tobacco Use  . Smoking status: Current Every Day Smoker    Packs/day: 0.50    Years: 60.00    Pack years: 30.00    Types: Cigarettes  . Smokeless tobacco: Never Used  Substance Use Topics  . Alcohol use: No    Alcohol/week: 0.0 standard drinks  .  Drug use: No    Review of Systems Constitutional: No fever/chills Eyes: No visual changes. ENT: No sore throat. Cardiovascular: Denies chest pain. Respiratory: Positive for shortness of breath. Gastrointestinal: No abdominal pain.  No nausea, no vomiting.  No diarrhea.  No constipation. Genitourinary: Negative for dysuria. Musculoskeletal: Negative for neck pain.  Negative for back pain. Integumentary: Negative for rash. Neurological: Negative for headaches, focal weakness or numbness.  ____________________________________________   PHYSICAL EXAM:  VITAL SIGNS: ED Triage Vitals  Enc Vitals Group     BP 11/27/18 0500 109/71     Pulse Rate 11/27/18 0500 96     Resp 11/27/18 0500 (!) 24     Temp --      Temp src --      SpO2 11/27/18 0500 97 %     Weight --      Height --      Head Circumference --      Peak Flow --      Pain Score 11/27/18 0452 6     Pain Loc --      Pain Edu? --      Excl. in [place]? --     Constitutional: Alert and oriented.  Apparent respiratory  eyes: Conjunctivae are normal.  Head:  Atraumatic. Mouth/Throat: Mucous membranes are moist.  Oropharynx non-erythematous. Neck: No stridor.   Cardiovascular: Normal rate, regular rhythm. Good peripheral circulation. Grossly normal heart sounds. Respiratory: Tachypnea, positive accessory respiratory muscle use, diffuse wheezes and rhonchi. Gastrointestinal: Soft and nontender. No distention.  Musculoskeletal: No lower extremity tenderness nor edema. No gross deformities of extremities. Neurologic:  Normal speech and language. No gross focal neurologic deficits are appreciated.  Skin:  Skin is warm, dry and intact. No rash noted. Psychiatric: Mood and affect are normal. Speech and behavior are normal.  ____________________________________________   LABS (all labs ordered are listed, but only abnormal results are displayed)  Labs Reviewed  BASIC METABOLIC PANEL - Abnormal; Notable for the following components:      Result Value   Glucose, Bld 122 (*)    All other components within normal limits  CBC - Abnormal; Notable for the following components:   WBC 12.3 (*)    All other components within normal limits  BLOOD GAS, VENOUS - Abnormal; Notable for the following components:   Bicarbonate 29.3 (*)    Acid-Base Excess 2.5 (*)    All other components within normal limits  POCT I-STAT, CHEM 8 - Abnormal; Notable for the following components:   Glucose, Bld 127 (*)    All other components within normal limits  TROPONIN I  I-STAT CHEM 8, ED   _____  RADIOLOGY I, [place] N BROWN, personally viewed and evaluated these images (plain radiographs) as part of my medical decision making, as well as reviewing the written report by the radiologist.  ED MD interpretation: Increasing opacity at the left lung base with volume loss atelectasis versus pneumonia versus aspiration per radiologist emphysematous changes.  Official radiology report(s): Dg Chest Port 1 View  Result Date: 11/27/2018 CLINICAL DATA:  Dyspnea. Cough. EXAM:  PORTABLE CHEST 1 VIEW COMPARISON:  Radiographs and CT 08/22/2018 FINDINGS: Patient is rotated. Increasing opacity with volume loss loss at the left lung base. Mild streaky right basilar atelectasis. Emphysema with associated bronchial thickening. No pneumothorax or large pleural effusion. No acute osseous abnormalities. IMPRESSION: 1. Rotated exam. Increasing opacity at the left lung base with volume loss may be atelectasis, pneumonia or aspiration. 2. Emphysema with associated bronchial  thickening. Streaky right infrahilar atelectasis. Electronically Signed   By: Keith Rake M.D.   On: 11/27/2018 06:18     Critical Care performed: .Critical Care Performed by: Gregor Hams, MD Authorized by: Gregor Hams, MD   Critical care provider statement:    Critical care time (minutes):  30   Critical care time was exclusive of:  Separately billable procedures and treating other patients and teaching time   Critical care was necessary to treat or prevent imminent or life-threatening deterioration of the following conditions:  Respiratory failure   Critical care was time spent personally by me on the following activities:  Development of treatment plan with patient or surrogate, discussions with consultants, evaluation of patient's response to treatment, examination of patient, obtaining history from patient or surrogate, ordering and performing treatments and interventions, ordering and review of laboratory studies, ordering and review of radiographic studies, pulse oximetry, re-evaluation of patient's condition and review of old charts   I assumed direction of critical care for this patient from another provider in my specialty: no       ____________________________________________   INITIAL IMPRESSION / ASSESSMENT AND PLAN / ED COURSE  As part of my medical decision making, I reviewed the following data within the electronic MEDICAL RECORD NUMBER  77 year old male presented with  above-stated history and physical exam secondary to respiratory distress.  Suspect COPD exacerbation versus pneumonia.  BiPAP applied immediately upon evaluation.  3 duo nebs administered as well as Solu-Medrol 125 mg.  Reevaluation 1 hour after this intervention revealed improved respiratory status on BiPAP.  Patient discussed with Dr. Marcille Blanco for hospital admission further evaluation and management.  Chest x-ray concerning also for pneumonia and as such patient given ceftriaxone and azithromycin. ____________________________________________  FINAL CLINICAL IMPRESSION(S) / ED DIAGNOSES  COPD exacerbation Community-acquired pneumonia  MEDICATIONS GIVEN DURING THIS VISIT:  Medications  methylPREDNISolone sodium succinate (SOLU-MEDROL) 125 mg/2 mL injection 125 mg (125 mg Intravenous Given 11/27/18 0457)  ipratropium-albuterol (DUONEB) 0.5-2.5 (3) MG/3ML nebulizer solution 3 mL (3 mLs Nebulization Given 11/27/18 0457)  ipratropium-albuterol (DUONEB) 0.5-2.5 (3) MG/3ML nebulizer solution 3 mL ( Nebulization Not Given 11/27/18 0459)  ipratropium-albuterol (DUONEB) 0.5-2.5 (3) MG/3ML nebulizer solution 3 mL (3 mLs Nebulization Given 11/27/18 0457)     ED Discharge Orders    None       Note:  This document was prepared using Dragon voice recognition software and may include unintentional dictation errors.    Gregor Hams, MD 11/27/18 [phone]

## 2018-11-27 NOTE — Clinical Social Work Note (Signed)
Patient is from [place].  Jones Broom. [place], MSW, [place]  11/27/2018 5:11 PM

## 2018-11-27 NOTE — ED Notes (Signed)
Patient taken off of bipap and placed on 4L [place] per Dr. Tressia Miners. Patient remaining at 95% on [place]. Speaking in complete sentences with NAD noted.

## 2018-11-28 LAB — BASIC METABOLIC PANEL
Anion gap: 8 (ref 5–15)
BUN: 20 mg/dL (ref 8–23)
CO2: 26 mmol/L (ref 22–32)
Calcium: 8.9 mg/dL (ref 8.9–10.3)
Chloride: 105 mmol/L (ref 98–111)
Creatinine, Ser: 1.03 mg/dL (ref 0.61–1.24)
GFR calc Af Amer: >60 mL/min (ref >60)
GFR calc non Af Amer: >60 mL/min (ref >60)
GLUCOSE: 121 mg/dL — AB (ref 70–99)
Potassium: 3.7 mmol/L (ref 3.5–5.1)
Sodium: 139 mmol/L (ref 135–145)

## 2018-11-28 LAB — CBC
HCT: 49.1 % (ref 39.0–52.0)
Hemoglobin: 15.3 g/dL (ref 13.0–17.0)
MCH: 29 pg (ref 26.0–34.0)
MCHC: 31.2 g/dL (ref 30.0–36.0)
MCV: 93 fL (ref 80.0–100.0)
Platelets: 245 10*3/uL (ref 150–400)
RBC: 5.28 MIL/uL (ref 4.22–5.81)
RDW: 14.5 % (ref 11.5–15.5)
WBC: 17.4 10*3/uL — ABNORMAL HIGH (ref 4.0–10.5)
nRBC: 0 % (ref 0.0–0.2)

## 2018-11-28 MED ORDER — LIDOCAINE 5 % EX PTCH: 1.0000 | MEDICATED_PATCH | CUTANEOUS | 0 refills | Status: AC

## 2018-11-28 MED ORDER — PREDNISONE 20 MG PO TABS: 40.0000 mg | ORAL_TABLET | Freq: Every day | ORAL | 0 refills | Status: AC

## 2018-11-28 MED ORDER — LEVOFLOXACIN 500 MG PO TABS: 500.0000 mg | ORAL_TABLET | Freq: Every day | ORAL | 0 refills | Status: DC

## 2018-11-28 NOTE — Progress Notes (Signed)
Patient SOB without oxygen on, resumed at 3l after neb

## 2018-11-28 NOTE — Care Management Note (Signed)
Case Management Note  Patient Details  Name: KORTNEY SCHOENFELDER MRN: [id number] Date of Birth: 1941-12-02  Subjective/Objective:     Patient is from [facility] independent living.  Discharging today with home health services through [facility].  After presenting list of agencies with ratings, referral made to [place] accepted.  Currently on room air.  Chronic Eliquis.   denies issues accessing medical care, obtaining medications or with transportation.  Current with PCP.                  Action/Plan:   Expected Discharge Date:  11/28/18               Expected Discharge Plan:  [place]  In-House Referral:     Discharge planning Services  CM Consult  Post Acute Care Choice:  Home Health Choice offered to:  Patient  DME Arranged:    DME Agency:     HH Arranged:  RN, PT [place] Agency:  [facility] (now [facility])  Status of Service:  Completed, signed off  If discussed at [place] of Stay Meetings, dates discussed:    Additional Comments:  Elza Rafter, RN 11/28/2018, 12:23 PM

## 2018-11-28 NOTE — Evaluation (Signed)
Physical Therapy Evaluation Patient Details Name: Ricardo Erickson MRN: [id number] DOB: 04/05/1941 Today's Date: 11/28/2018   History of Present Illness  presented to ER secondary to SOB, LBP; admitted with acute respiratory distress secondary to L LL PNA, COPD exacerbation. Initially requiring BiPAP; now weaned to RA.  Clinical Impression  Upon evaluation, patient alert and oriented to basic information; follows commands, limited recall of new information, limited insight into deficits/safety needs.  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  bilat UE/LE strength and ROM grossly symmetrical and WFL; no focal weakness appreciated.  Able to complete sit/stand, basic transfers and gait (50') with 4WRW, cga/min assist.  Multiple attempts, use of momentum and bilat UE support required to complete sit/stand; requires UE support to safely maintain balance with standing activities. Of note, patient HR up to 130-140s with minimal gait distance/exertion; recovers to 90-100s with seated rest (within 30-45 seconds).  Sats >94% on RA throughout, though mod SOB with exertion.  RN informed/aware. Would benefit from skilled PT to address above deficits and promote optimal return to PLOF; Recommend transition to [place] upon discharge from acute hospitalization.    Follow Up Recommendations Home health PT(HHPT/OT)    Equipment Recommendations       Recommendations for Other Services       Precautions / Restrictions Precautions Precautions: Fall Restrictions Weight Bearing Restrictions: No      Mobility  Bed Mobility               General bed mobility comments: seated in recliner beginning/end of treatment session  Transfers Overall transfer level: Needs assistance Equipment used: 4-wheeled walker Transfers: Sit to/from Stand Sit to Stand: Min guard;Min assist         General transfer comment: tends to pull on RW despite cuing from therapist; broad  BOS, multiple attempts, use of momentum required to complete  Ambulation/Gait Ambulation/Gait assistance: Min assist;Min guard Gait Distance (Feet): 50 Feet Assistive device: 4-wheeled walker       General Gait Details: forward flexed posture, RW arms length anterior to patient; R posterior/lateral hip weakness in loading/stance, resulting in L hip drop. Mod SOB with minimal exertion, sats >94% on RA throughout.  Stairs            Wheelchair Mobility    Modified Rankin (Stroke Patients Only)       Balance Overall balance assessment: Needs assistance Sitting-balance support: No upper extremity supported;Feet supported Sitting balance-Leahy Scale: Good     Standing balance support: Bilateral upper extremity supported Standing balance-Leahy Scale: Fair                               Pertinent Vitals/Pain Pain Assessment: No/denies pain    Home Living Family/patient expects to be discharged to:: ([place]) Living Arrangements: Alone               Additional Comments: Per chart, has caregiver that is available    Prior Function Level of Independence: Needs assistance         Comments: Sup/mod indep with quad cane or 4WRW; assist from staff/family/caregiver as needed for ADLs.  Does travel to/from dining hall for at least one [place]        Extremity/Trunk Assessment   Upper Extremity Assessment Upper Extremity Assessment: Overall WFL for tasks assessed    Lower Extremity Assessment Lower Extremity Assessment: Overall  WFL for tasks assessed(grossly at least 4-/5 throughout)    Cervical / Trunk Assessment Cervical / Trunk Assessment: (kyphotic with scoliotic curve torwards R)  Communication   Communication: No difficulties  Cognition Arousal/Alertness: Awake/alert Behavior During Therapy: WFL for tasks assessed/performed Overall Cognitive Status: No family/caregiver present to determine baseline  cognitive functioning                                 General Comments: oriented to self, general location and situation; intermitten confusion with inconsistent recall of information (especially new)      General Comments      Exercises     Assessment/Plan    PT Assessment Patient needs continued PT services  PT Problem List Decreased strength;Decreased range of motion;Decreased activity tolerance;Decreased balance;Decreased mobility;Decreased coordination;Decreased cognition;Decreased knowledge of use of DME;Decreased safety awareness;Cardiopulmonary status limiting activity;Decreased knowledge of precautions       PT Treatment Interventions DME instruction;Gait training;Functional mobility training;Therapeutic activities;Therapeutic exercise;Balance training;Cognitive remediation;Patient/family education    PT Goals (Current goals can be found in the Care Plan section)  Acute Rehab PT Goals Patient Stated Goal: to return home and get more therapy; wants to be able to go to grandson's football games PT Goal Formulation: With patient Time For Goal Achievement: 12/12/18 Potential to Achieve Goals: Good    Frequency Min 2X/week   Barriers to discharge Decreased caregiver support      Co-evaluation               AM-PAC PT "6 Clicks" Mobility  Outcome Measure Help needed turning from your back to your side while in a flat bed without using bedrails?: A Little Help needed moving from lying on your back to sitting on the side of a flat bed without using bedrails?: A Little Help needed moving to and from a bed to a chair (including a wheelchair)?: A Little Help needed standing up from a chair using your arms (e.g., wheelchair or bedside chair)?: A Little Help needed to walk in hospital room?: A Little Help needed climbing 3-5 steps with a railing? : A Lot 6 Click Score: 17    End of Session Equipment Utilized During Treatment: Gait belt Activity  Tolerance: Patient tolerated treatment well Patient left: in chair;with call bell/phone within reach;with chair alarm set Nurse Communication: Mobility status PT Visit Diagnosis: Muscle weakness (generalized) (M62.81);Difficulty in walking, not elsewhere classified (R26.2)    Time: 6945-0388 PT Time Calculation (min) (ACUTE ONLY): 17 min   Charges:   PT Evaluation $PT Eval Moderate Complexity: 1 Mod          Ricardo Erickson, PT, DPT, NCS 11/28/18, 10:59 AM [phone]

## 2018-11-28 NOTE — Progress Notes (Signed)
Took over from [place], Therapist, sports at [employer]. Removed patient's IVs and tele, and educated pt and his son on AVS, medications and follow up appointments. Pt discharged with son to home.

## 2018-12-02 DIAGNOSIS — G8929 Other chronic pain: Secondary | ICD-10-CM | POA: Diagnosis not present

## 2018-12-02 DIAGNOSIS — J449 Chronic obstructive pulmonary disease, unspecified: Secondary | ICD-10-CM | POA: Diagnosis not present

## 2018-12-02 DIAGNOSIS — Z86711 Personal history of pulmonary embolism: Secondary | ICD-10-CM | POA: Diagnosis not present

## 2018-12-02 DIAGNOSIS — I110 Hypertensive heart disease with heart failure: Secondary | ICD-10-CM | POA: Diagnosis not present

## 2018-12-02 DIAGNOSIS — Z9181 History of falling: Secondary | ICD-10-CM | POA: Diagnosis not present

## 2018-12-02 DIAGNOSIS — F329 Major depressive disorder, single episode, unspecified: Secondary | ICD-10-CM | POA: Diagnosis not present

## 2018-12-02 DIAGNOSIS — J188 Other pneumonia, unspecified organism: Secondary | ICD-10-CM | POA: Diagnosis not present

## 2018-12-02 DIAGNOSIS — Z87311 Personal history of (healed) other pathological fracture: Secondary | ICD-10-CM | POA: Diagnosis not present

## 2018-12-02 DIAGNOSIS — Z7901 Long term (current) use of anticoagulants: Secondary | ICD-10-CM | POA: Diagnosis not present

## 2018-12-02 DIAGNOSIS — I509 Heart failure, unspecified: Secondary | ICD-10-CM | POA: Diagnosis not present

## 2018-12-02 DIAGNOSIS — F4310 Post-traumatic stress disorder, unspecified: Secondary | ICD-10-CM | POA: Diagnosis not present

## 2018-12-02 DIAGNOSIS — F17210 Nicotine dependence, cigarettes, uncomplicated: Secondary | ICD-10-CM | POA: Diagnosis not present

## 2018-12-02 DIAGNOSIS — N400 Enlarged prostate without lower urinary tract symptoms: Secondary | ICD-10-CM | POA: Diagnosis not present

## 2018-12-02 DIAGNOSIS — J439 Emphysema, unspecified: Secondary | ICD-10-CM | POA: Diagnosis not present

## 2018-12-02 DIAGNOSIS — F0390 Unspecified dementia without behavioral disturbance: Secondary | ICD-10-CM | POA: Diagnosis not present

## 2018-12-03 ENCOUNTER — Telehealth: Payer: Self-pay

## 2018-12-03 NOTE — Telephone Encounter (Signed)
Flagged on EMMI report for being unsure if he received his discharge papers.  First attempt to reach patient made, however unable to reach patient.  Left voicemail encouraging callback. Will attempt at later time.

## 2018-12-07 NOTE — Discharge Summary (Signed)
[facility] NAME: Ricardo Erickson    MR#:  [id number]  DATE OF BIRTH:  1941-01-26  DATE OF ADMISSION:  11/27/2018   ADMITTING PHYSICIAN: Gladstone Lighter, MD  DATE OF DISCHARGE: 11/28/2018  4:38 PM  PRIMARY CARE PHYSICIAN: Rusty Aus, MD   ADMISSION DIAGNOSIS:   back pain, diff breathing  DISCHARGE DIAGNOSIS:   Active Problems:   Pneumonia   SECONDARY DIAGNOSIS:   Past Medical History:  Diagnosis Date  . CHF (congestive heart failure) ([place])   . Chronic back pain   . Dementia ([place])   . Depression   . Emphysema lung ([place])   . Hypertension   . Personal history of tobacco use, presenting hazards to health 08/06/2015  . Pollen allergies   . PTSD (post-traumatic stress disorder)   . Pulmonary embolism [facility])     HOSPITAL COURSE:    Ricardo Erickson  is a 77 y.o. male with a known history of PE on Eliquis, COPD with ongoing smoking, mild dementia, chronic low back pain from vertebral fractures who is from an independent living facility presents to hospital secondary to worsening shortness of breath and low back pain.  1.  Acute respiratory distress-secondary to COPD exacerbation and also left lower lobe pneumonia -Was on BiPAP initially when admitted, then weaned to nasal cannula.  Currently off of oxygen at the time of discharge -Received IV steroids and IV antibiotics while in the hospital.  Also had nebs and inhalers. -Being discharged on Levaquin orally and also prednisone for 5 more days. -Breathing has improved significantly.  Advised to cut down smoking -Continue inhalers at home  2.  Low back pain-has chronic low back pain.  History of L1 kyphoplasty. -Pain medication as tolerated.  Lidoderm patch ordered    3.  Tobacco use disorder-counseled   4.  History of PE-continue Eliquis  5.  BPH-follows with urologist as outpatient.  Continue Proscar and high-dose Flomax  6.  Dementia-pleasantly confused,  seems to be at baseline. -Patient also can get agitated in a new setting like hospital. -Very anxious to be back in his old environment. -Son updated at bedside.  According to son, patient is at his baseline. -We will discharge back to assisted living facility today   DISCHARGE CONDITIONS:   Guarded  CONSULTS OBTAINED:   Treatment Team:  Gladstone Lighter, MD  DRUG ALLERGIES:   No Known Allergies DISCHARGE MEDICATIONS:   Allergies as of 11/28/2018   No Known Allergies     Medication List    STOP taking these medications   azithromycin 250 MG tablet Commonly known as:  ZITHROMAX Z-PAK   feeding supplement (ENSURE ENLIVE) Liqd   oxyCODONE 5 MG immediate release tablet Commonly known as:  ROXICODONE   predniSONE 10 MG (21) Tbpk tablet Commonly known as:  STERAPRED UNI-PAK 21 TAB   predniSONE 20 MG tablet Commonly known as:  DELTASONE   QUEtiapine 25 MG tablet Commonly known as:  SEROQUEL     TAKE these medications   acetaminophen 650 MG CR tablet Commonly known as:  TYLENOL Take 650 mg by mouth every 8 (eight) hours as needed for pain.   ADVAIR DISKUS 250-50 MCG/DOSE Aepb Generic drug:  Fluticasone-Salmeterol Inhale 1 puff into the lungs every 12 (twelve) hours.   albuterol 108 (90 Base) MCG/ACT inhaler Commonly known as:  PROVENTIL HFA;VENTOLIN HFA Inhale 2 puffs into the lungs every 4 (four) hours as needed for wheezing or shortness of breath.  albuterol (2.5 MG/3ML) 0.083% nebulizer solution Commonly known as:  PROVENTIL Inhale 2.5 mg into the lungs 4 (four) times daily.   apixaban 5 MG Tabs tablet Commonly known as:  ELIQUIS Take 1 tablet (5 mg total) by mouth 2 (two) times daily.   docusate sodium 100 MG capsule Commonly known as:  COLACE Take 1 capsule (100 mg total) by mouth 2 (two) times daily.   finasteride 5 MG tablet Commonly known as:  PROSCAR Take 1 tablet (5 mg total) by mouth daily.   FLUoxetine 10 MG capsule Commonly known  as:  PROZAC Take 10 mg by mouth daily. What changed:  Another medication with the same name was removed. Continue taking this medication, and follow the directions you see here.   furosemide 20 MG tablet Commonly known as:  LASIX Take 20 mg by mouth every morning.   levofloxacin 500 MG tablet Commonly known as:  LEVAQUIN Take 1 tablet (500 mg total) by mouth daily.   lidocaine 5 % Commonly known as:  LIDODERM Place 1 patch onto the skin daily. Remove & Discard patch within 12 hours or as directed by MD   metoprolol succinate 50 MG 24 hr tablet Commonly known as:  TOPROL-XL Take 50 mg by mouth daily. Take with or immediately following a meal.   OLANZapine 2.5 MG tablet Commonly known as:  ZYPREXA Take 1.25 mg by mouth at bedtime. What changed:  Another medication with the same name was removed. Continue taking this medication, and follow the directions you see here.   polyethylene glycol packet Commonly known as:  MIRALAX / GLYCOLAX Take 17 g by mouth daily as needed for mild constipation.   potassium chloride 10 MEQ tablet Commonly known as:  K-DUR Take 10 mEq by mouth daily.   pramipexole 1 MG tablet Commonly known as:  MIRAPEX Take 1 tablet (1 mg total) by mouth at bedtime.   SPIRIVA HANDIHALER 18 MCG inhalation capsule Generic drug:  tiotropium Place 18 mcg into inhaler and inhale daily.   tamsulosin 0.4 MG Caps capsule Commonly known as:  FLOMAX Take 2 capsules (0.8 mg total) by mouth daily after supper.   traMADol 50 MG tablet Commonly known as:  ULTRAM Take 1 tablet (50 mg total) by mouth every 6 (six) hours as needed. What changed:  when to take this     ASK your doctor about these medications   predniSONE 20 MG tablet Commonly known as:  DELTASONE Take 2 tablets (40 mg total) by mouth daily with breakfast for 5 days. Ask about: Should I take this medication?        DISCHARGE INSTRUCTIONS:   1.  PCP follow-up in 1 to 2 weeks  DIET:   Cardiac  diet  ACTIVITY:   Activity as tolerated  OXYGEN:   Home Oxygen: No.  Oxygen Delivery: room air  DISCHARGE LOCATION:   Assisted living facility  If you experience worsening of your admission symptoms, develop shortness of breath, life threatening emergency, suicidal or homicidal thoughts you must seek medical attention immediately by calling 911 or calling your MD immediately  if symptoms less severe.  You Must read complete instructions/literature along with all the possible adverse reactions/side effects for all the Medicines you take and that have been prescribed to you. Take any new Medicines after you have completely understood and accpet all the possible adverse reactions/side effects.   Please note  You were cared for by a hospitalist during your hospital stay. If you have any questions  about your discharge medications or the care you received while you were in the hospital after you are discharged, you can call the unit and asked to speak with the hospitalist on call if the hospitalist that took care of you is not available. Once you are discharged, your primary care physician will handle any further medical issues. Please note that NO REFILLS for any discharge medications will be authorized once you are discharged, as it is imperative that you return to your primary care physician (or establish a relationship with a primary care physician if you do not have one) for your aftercare needs so that they can reassess your need for medications and monitor your lab values.    On the day of Discharge:  VITAL SIGNS:   Blood pressure 122/86, pulse (!) 137, temperature (!) 97.5 F (36.4 C), temperature source Oral, resp. rate (!) 22, height 5\' 11"  (1.803 m), weight 96.2 kg, SpO2 94 %.  PHYSICAL EXAMINATION:    GENERAL:  77 y.o.-year-old patient lying in the bed with no acute distress.  EYES: Pupils equal, round, reactive to light and accommodation. No scleral icterus. Extraocular  muscles intact.  HEENT: Head atraumatic, normocephalic. Oropharynx and nasopharynx clear.  NECK:  Supple, no jugular venous distention. No thyroid enlargement, no tenderness.  LUNGS: Normal breath sounds bilaterally, minimal expiratory wheezing noted all over the lung fields.  No  rales,rhonchi or crepitation. No use of accessory muscles of respiration.  CARDIOVASCULAR: S1, S2 normal. No  rubs, or gallops.  2/6 systolic murmur is present ABDOMEN: Soft, non-tender, non-distended. Bowel sounds present. No organomegaly or mass.  EXTREMITIES: No pedal edema, cyanosis, or clubbing.  NEUROLOGIC: Cranial nerves II through XII are intact. Muscle strength 5/5 in all extremities. Sensation intact. Gait not checked.  PSYCHIATRIC: The patient is alert and oriented x 3.  Intermittent confusion and no long-term memory SKIN: No obvious rash, lesion, or ulcer.   DATA REVIEW:   CBC No results for input(s): WBC, HGB, HCT, PLT in the last 168 hours.  Chemistries  No results for input(s): NA, K, CL, CO2, GLUCOSE, BUN, CREATININE, CALCIUM, MG, AST, ALT, ALKPHOS, BILITOT in the last 168 hours.  Invalid input(s): [place]   Microbiology Results  Results for orders placed or performed during the hospital encounter of 07/15/18  Culture, blood (routine x 2)     Status: None   Collection Time: 07/15/18  2:11 PM  Result Value Ref Range Status   Specimen Description BLOOD BLOOD LEFT HAND  Final   Special Requests   Final    BOTTLES DRAWN AEROBIC AND ANAEROBIC BLOOD LEFT HAND   Culture   Final    NO GROWTH 5 DAYS Performed at [facility], [address], [place], [place] [address]    Report Status 07/20/2018 FINAL  Final    RADIOLOGY:  No results found.   Management plans discussed with the patient, family and they are in agreement.  CODE STATUS:  Code Status History    Date Active Date Inactive Code Status Order ID Comments User Context   11/27/2018 1042 11/28/2018 1939 Full Code [id number]   Gladstone Lighter, MD ED   11/18/2017 1245 11/22/2017 2031 DNR [id number]  Hillary Bow, MD Inpatient   11/18/2017 0854 11/18/2017 1245 Full Code [id number]  Hillary Bow, MD ED   07/21/2017 1308 07/28/2017 1900 DNR [id number]  Max Sane, MD Inpatient   07/20/2017 0058 07/21/2017 1308 Full Code [id number]  Lance Coon, MD Inpatient   06/24/2017 2235 06/30/2017 4709 Full  Code [id number]  Fritzi Mandes, MD Inpatient    Advance Directive Documentation     Most Recent Value  Type of Advance Directive  Healthcare Power of Attorney, Living will  Pre-existing out of facility DNR order (yellow form or pink MOST form)  -  "MOST" Form in Place?  -      TOTAL TIME TAKING CARE OF THIS PATIENT: 38 minutes.    Gladstone Lighter M.D on 12/07/2018 at 8:29 AM  Between 7am to 6pm - Pager - [phone]  After 6pm go to [email or web address] - Proofreader  [facility]  Office  [phone]  CC: Primary care physician; Rusty Aus, MD   Note: This dictation was prepared with Dragon dictation along with smaller phrase technology. Any transcriptional errors that result from this process are unintentional.

## 2018-12-07 NOTE — Telephone Encounter (Signed)
Reached upon second attempt. Patient unable to locate his discharge papers. Requested a copy.  Confirmed address to send to.  No further questions or issues at this time.

## 2018-12-09 ENCOUNTER — Other Ambulatory Visit: Payer: Self-pay

## 2018-12-09 ENCOUNTER — Encounter: Payer: Self-pay | Admitting: Emergency Medicine

## 2018-12-09 ENCOUNTER — Emergency Department: Payer: No Typology Code available for payment source

## 2018-12-09 ENCOUNTER — Emergency Department
Admission: EM | Admit: 2018-12-09 | Discharge: 2018-12-09 | Disposition: A | Discharge status: Home / Self Care | Payer: No Typology Code available for payment source | Attending: Emergency Medicine | Admitting: Emergency Medicine

## 2018-12-09 DIAGNOSIS — Z86711 Personal history of pulmonary embolism: Secondary | ICD-10-CM | POA: Diagnosis not present

## 2018-12-09 DIAGNOSIS — I509 Heart failure, unspecified: Secondary | ICD-10-CM | POA: Diagnosis not present

## 2018-12-09 DIAGNOSIS — I110 Hypertensive heart disease with heart failure: Secondary | ICD-10-CM | POA: Diagnosis not present

## 2018-12-09 DIAGNOSIS — J449 Chronic obstructive pulmonary disease, unspecified: Secondary | ICD-10-CM | POA: Diagnosis not present

## 2018-12-09 DIAGNOSIS — I517 Cardiomegaly: Secondary | ICD-10-CM | POA: Diagnosis not present

## 2018-12-09 DIAGNOSIS — Z8546 Personal history of malignant neoplasm of prostate: Secondary | ICD-10-CM | POA: Insufficient documentation

## 2018-12-09 DIAGNOSIS — F419 Anxiety disorder, unspecified: Secondary | ICD-10-CM | POA: Diagnosis not present

## 2018-12-09 DIAGNOSIS — Z7901 Long term (current) use of anticoagulants: Secondary | ICD-10-CM | POA: Diagnosis not present

## 2018-12-09 DIAGNOSIS — F17210 Nicotine dependence, cigarettes, uncomplicated: Secondary | ICD-10-CM | POA: Insufficient documentation

## 2018-12-09 DIAGNOSIS — R4182 Altered mental status, unspecified: Secondary | ICD-10-CM | POA: Diagnosis not present

## 2018-12-09 DIAGNOSIS — Z79899 Other long term (current) drug therapy: Secondary | ICD-10-CM | POA: Insufficient documentation

## 2018-12-09 DIAGNOSIS — R7303 Prediabetes: Secondary | ICD-10-CM | POA: Insufficient documentation

## 2018-12-09 DIAGNOSIS — F0390 Unspecified dementia without behavioral disturbance: Secondary | ICD-10-CM | POA: Insufficient documentation

## 2018-12-09 DIAGNOSIS — M7989 Other specified soft tissue disorders: Secondary | ICD-10-CM | POA: Diagnosis not present

## 2018-12-09 DIAGNOSIS — R410 Disorientation, unspecified: Secondary | ICD-10-CM | POA: Diagnosis not present

## 2018-12-09 LAB — URINALYSIS, COMPLETE (UACMP) WITH MICROSCOPIC
Bacteria, UA: NONE SEEN
Bilirubin Urine: NEGATIVE
Glucose, UA: NEGATIVE mg/dL
Hgb urine dipstick: NEGATIVE
Ketones, ur: NEGATIVE mg/dL
Leukocytes, UA: NEGATIVE
Nitrite: NEGATIVE
Protein, ur: NEGATIVE mg/dL
Specific Gravity, Urine: 1.011 (ref 1.005–1.030)
Squamous Epithelial / HPF: NONE SEEN (ref 0–5)
pH: 5 (ref 5.0–8.0)

## 2018-12-09 LAB — COMPREHENSIVE METABOLIC PANEL
ALBUMIN: 3.7 g/dL (ref 3.5–5.0)
ALT: 13 U/L (ref 0–44)
AST: 30 U/L (ref 15–41)
Alkaline Phosphatase: 104 U/L (ref 38–126)
Anion gap: 9 (ref 5–15)
BUN: 17 mg/dL (ref 8–23)
CO2: 26 mmol/L (ref 22–32)
Calcium: 8.8 mg/dL — ABNORMAL LOW (ref 8.9–10.3)
Chloride: 105 mmol/L (ref 98–111)
Creatinine, Ser: 0.9 mg/dL (ref 0.61–1.24)
GFR calc Af Amer: >60 mL/min (ref >60)
GFR calc non Af Amer: >60 mL/min (ref >60)
Glucose, Bld: 98 mg/dL (ref 70–99)
Potassium: 3.6 mmol/L (ref 3.5–5.1)
Sodium: 140 mmol/L (ref 135–145)
Total Bilirubin: 1.6 mg/dL — ABNORMAL HIGH (ref 0.3–1.2)
Total Protein: 6.4 g/dL — ABNORMAL LOW (ref 6.5–8.1)

## 2018-12-09 LAB — CBC
HCT: 51.5 % (ref 39.0–52.0)
HEMOGLOBIN: 16.3 g/dL (ref 13.0–17.0)
MCH: 29.4 pg (ref 26.0–34.0)
MCHC: 31.7 g/dL (ref 30.0–36.0)
MCV: 93 fL (ref 80.0–100.0)
Platelets: 268 10*3/uL (ref 150–400)
RBC: 5.54 MIL/uL (ref 4.22–5.81)
RDW: 14.3 % (ref 11.5–15.5)
WBC: 13 10*3/uL — ABNORMAL HIGH (ref 4.0–10.5)
nRBC: 0 % (ref 0.0–0.2)

## 2018-12-09 NOTE — ED Provider Notes (Addendum)
[facility] Emergency Department Provider Note  ____________________________________________   I have reviewed the triage vital signs and the nursing notes. Where available I have reviewed prior notes and, if possible and indicated, outside hospital notes.    HISTORY  Chief Complaint Altered Mental Status    HPI Ricardo Erickson is a 77 y.o. male  Has a history of multiple medical problems, also history of anxiety and is being treated with Prozac and other medications for that.  Family states he has been seeming more anxious over the last couple weeks and has a long history of this.  They also state that he has been somewhat confused sometimes which he is not at this moment.  This is also slightly there is been recurrent for several years.  They want to make sure that there is no other pathology accounting for what they see including urinary tract infection or other issues.  Patient himself states that he just feels anxious sometimes.  No SI no HI and he is living in a facility.  They do by the way know that he has 2 different lumbar fractures and they are meeting with the back doctor to take care of this in the next day or 2     Past Medical History:  Diagnosis Date  . CHF (congestive heart failure) ([place])   . Chronic back pain   . Dementia ([place])   . Depression   . Emphysema lung ([place])   . Hypertension   . Personal history of tobacco use, presenting hazards to health 08/06/2015  . Pollen allergies   . PTSD (post-traumatic stress disorder)   . Pulmonary embolism [facility])     Patient Active Problem List   Diagnosis Date Noted  . Pneumonia 11/27/2018  . Pulmonary emboli ([place]) 11/18/2017  . Tobacco use disorder 07/24/2017  . Encephalopathy   . Palliative care encounter   . Goals of care, counseling/discussion   . Pulmonary embolus ([place])   . UTI (urinary tract infection) 07/19/2017  . PTSD (post-traumatic stress disorder) 06/28/2017  . Adjustment disorder  with anxiety 06/28/2017  . Closed compression fracture of L5 lumbar vertebra   . Left low back pain   . Weakness   . A-fib ([place]) 06/24/2017  . Prediabetes 01/18/2017  . Prostate cancer ([place]) 01/18/2017  . Anxiety 01/18/2017  . Preventative health care 04/15/2016  . Restless leg syndrome 04/03/2016  . COPD (chronic obstructive pulmonary disease) ([place]) 04/03/2016  . BPH (benign prostatic hyperplasia) 04/03/2016  . Personal history of tobacco use, presenting hazards to health 08/06/2015    Past Surgical History:  Procedure Laterality Date  . BACK SURGERY    . KYPHOPLASTY N/A 06/27/2017   Procedure: KYPHOPLASTY -L5;  Surgeon: Hessie Knows, MD;  Location: [facility] ORS;  Service: Orthopedics;  Laterality: N/A;  . [place]    Prior to Admission medications   Medication Sig Start Date End Date Taking? Authorizing Provider  acetaminophen (TYLENOL) 650 MG CR tablet Take 650 mg by mouth every 8 (eight) hours as needed for pain.    [provider]  ADVAIR DISKUS 250-50 MCG/DOSE AEPB Inhale 1 puff into the lungs every 12 (twelve) hours. 04/09/18   [provider]  albuterol (PROVENTIL HFA;VENTOLIN HFA) 108 (90 Base) MCG/ACT inhaler Inhale 2 puffs into the lungs every 4 (four) hours as needed for wheezing or shortness of breath.     [provider]  albuterol (PROVENTIL) (2.5 MG/3ML) 0.083% nebulizer solution Inhale 2.5 mg into  the lungs 4 (four) times daily.  01/15/15   [provider]  apixaban (ELIQUIS) 5 MG TABS tablet Take 1 tablet (5 mg total) by mouth 2 (two) times daily. 11/28/17   Gladstone Lighter, MD  docusate sodium (COLACE) 100 MG capsule Take 1 capsule (100 mg total) by mouth 2 (two) times daily. Patient not taking: Reported on 11/27/2018 11/22/17   Gladstone Lighter, MD  finasteride (PROSCAR) 5 MG tablet Take 1 tablet (5 mg total) by mouth daily. 02/02/18   Festus Aloe, MD  FLUoxetine (PROZAC) 10 MG capsule Take 10 mg  by mouth daily.    [provider]  furosemide (LASIX) 20 MG tablet Take 20 mg by mouth every morning.  06/29/18   [provider]  levofloxacin (LEVAQUIN) 500 MG tablet Take 1 tablet (500 mg total) by mouth daily. 11/28/18   Gladstone Lighter, MD  lidocaine (LIDODERM) 5 % Place 1 patch onto the skin daily. Remove & Discard patch within 12 hours or as directed by MD 11/28/18   Gladstone Lighter, MD  metoprolol succinate (TOPROL-XL) 50 MG 24 hr tablet Take 50 mg by mouth daily. Take with or immediately following a meal.    [provider]  OLANZapine (ZYPREXA) 2.5 MG tablet Take 1.25 mg by mouth at bedtime.    [provider]  polyethylene glycol (MIRALAX / GLYCOLAX) packet Take 17 g by mouth daily as needed for mild constipation. Patient not taking: Reported on 11/27/2018 11/22/17   Gladstone Lighter, MD  potassium chloride (K-DUR) 10 MEQ tablet Take 10 mEq by mouth daily. 06/27/18 06/27/19  [provider]  pramipexole (MIRAPEX) 1 MG tablet Take 1 tablet (1 mg total) by mouth at bedtime. 11/22/17   Gladstone Lighter, MD  SPIRIVA HANDIHALER 18 MCG inhalation capsule Place 18 mcg into inhaler and inhale daily. 06/11/18   [provider]  tamsulosin (FLOMAX) 0.4 MG CAPS capsule Take 2 capsules (0.8 mg total) by mouth daily after supper. 02/02/18   Festus Aloe, MD  traMADol (ULTRAM) 50 MG tablet Take 1 tablet (50 mg total) by mouth every 6 (six) hours as needed. Patient taking differently: Take 50 mg by mouth 2 (two) times daily.  03/07/18   Lisa Roca, MD    Allergies Patient has no known allergies.  Family History  Problem Relation Age of Onset  . Alcohol abuse Father   . Arthritis Mother   . Heart disease Maternal Grandmother   . Prostate cancer Brother     Social History Social History   Tobacco Use  . Smoking status: Current Every Day Smoker    Packs/day: 0.50    Years: 60.00    Pack years: 30.00    Types: Cigarettes  .  Smokeless tobacco: Never Used  Substance Use Topics  . Alcohol use: No    Alcohol/week: 0.0 standard drinks  . Drug use: No    Review of Systems Constitutional: No fever/chills Eyes: No visual changes. ENT: No sore throat. No stiff neck no neck pain Cardiovascular: Denies chest pain. Respiratory: Denies shortness of breath. Gastrointestinal:   no vomiting.  No diarrhea.  No constipation. Genitourinary: Negative for dysuria. Musculoskeletal: + lower extremity swelling Skin: Negative for rash. Neurological: Negative for severe headaches, focal weakness or numbness.   ____________________________________________   PHYSICAL EXAM:  VITAL SIGNS: ED Triage Vitals  Enc Vitals Group     BP 12/09/18 1148 115/80     Pulse Rate 12/09/18 1148 78     Resp 12/09/18 1148 16  Temp 12/09/18 1148 98.3 F (36.8 C)     Temp Source 12/09/18 1148 Oral     SpO2 12/09/18 1148 95 %     Weight 12/09/18 1150 212 lb 1.3 oz (96.2 kg)     Height 12/09/18 1150 5\' 11"  (1.803 m)     Head Circumference --      Peak Flow --      Pain Score 12/09/18 1149 0     Pain Loc --      Pain Edu? --      Excl. in [place]? --     Constitutional: Alert and oriented name and place not exactly sure of the date, can tell me his age however. Well appearing and in no acute distress. Eyes: Conjunctivae are normal Head: Atraumatic HEENT: No congestion/rhinnorhea. Mucous membranes are moist.  Oropharynx non-erythematous Neck:   Nontender with no meningismus, no masses, no stridor Cardiovascular: Normal rate, regular rhythm. Grossly normal heart sounds.  Good peripheral circulation. Respiratory: Normal respiratory effort.  No retractions. Lungs CTAB. Abdominal: Soft and nontender. No distention. No guarding no rebound Back:  There is no focal tenderness or step off.  there is no midline tenderness there are no lesions  Musculoskeletal: No lower extremity tenderness, no upper extremity tenderness. No joint effusions, no  DVT signs strong distal pulses bilateral pitting edema symmetric Neurologic:  Normal speech and language. No gross focal neurologic deficits are appreciated.  Skin:  Skin is warm, dry and intact. No rash noted. Psychiatric: Mood and affect are a bit anxious. Speech and behavior are normal.  ____________________________________________   LABS (all labs ordered are listed, but only abnormal results are displayed)  Labs Reviewed  COMPREHENSIVE METABOLIC PANEL - Abnormal; Notable for the following components:      Result Value   Calcium 8.8 (*)    Total Protein 6.4 (*)    Total Bilirubin 1.6 (*)    All other components within normal limits  CBC - Abnormal; Notable for the following components:   WBC 13.0 (*)    All other components within normal limits  URINALYSIS, COMPLETE (UACMP) WITH MICROSCOPIC - Abnormal; Notable for the following components:   Color, Urine YELLOW (*)    APPearance CLEAR (*)    All other components within normal limits  CBG MONITORING, ED    Pertinent labs  results that were available during my care of the patient were reviewed by me and considered in my medical decision making (see chart for details). ____________________________________________  EKG  I personally interpreted any EKGs ordered by me or triage  ____________________________________________  RADIOLOGY  Pertinent labs & imaging results that were available during my care of the patient were reviewed by me and considered in my medical decision making (see chart for details). If possible, patient and/or family made aware of any abnormal findings.  No results found. ____________________________________________    PROCEDURES  Procedure(s) performed: None  Procedures  Critical Care performed: None  ____________________________________________   INITIAL IMPRESSION / ASSESSMENT AND PLAN / ED COURSE  Pertinent labs & imaging results that were available during my care of the patient were  reviewed by me and considered in my medical decision making (see chart for details).  She is in no acute distress, he is somewhat anxious but not decompensated clinically or psychiatrically.  Family are concerned that perhaps there is a urinary tract infection because he seems more anxious than usual recently.  Is been no known head injury he is on Eliquis.  We will  obtain a CT scan because of my inability to fully assess the patient's mental status given his dementia, and there is a limitation to our ability therefore to get a history about head trauma.  If that is negative and chest x-ray does not show any change, I think most likely we will be able to get him home with outpatient follow-up.  Son wants to know if I can change his psychiatric medications and I have told him that it would be better managed by his primary care doctor as this is not something I am generally speaking trained to do  ----------------------------------------- 2:37 PM on 12/09/2018 -----------------------------------------  Patient in no acute distress, very reassuring work-up, mild leukocytosis of indeterminate provenance, but otherwise no acute findings no evidence of UTI no evidence of pneumonia no evidence of head bleed etc.  Not sure what else to do about the patient's longstanding anxiety issues except for to reassure the family that at this time I do not see no primary organic pathology present, as he is still somewhat anxious I can give him a little dose of something to calm his nerves prior to discharge but I do not see any reason to keep him in the hospital.  Family very comfortable with this plan.  They do have extensive outpatient follow-up.  We do note that he has some fractures of his spine which are known and chronic, he is not suffering from anything that we can determine to be consistent with an acute neurologic pathology as a result of these fractures and again he has outpatient follow-up very closely with his  back doctor for this.  Extensive return precautions follow-up given and understood.    ____________________________________________   FINAL CLINICAL IMPRESSION(S) / ED DIAGNOSES  Final diagnoses:  None      This chart was dictated using voice recognition software.  Despite best efforts to proofread,  errors can occur which can change meaning.      Schuyler Amor, MD 12/09/18 1406    Schuyler Amor, MD 12/09/18 1438

## 2018-12-09 NOTE — ED Triage Notes (Signed)
Pt to ED via POV for AMS, pt states that for the past few days pt has had decline in his mental status, pt has also been having some visual hallucinations. Pt is A & O x 3 in triage. Pt does not appear to be in distress at this time.

## 2018-12-09 NOTE — ED Notes (Signed)
Patient's son reports patient has had worsening confusion and hallucinations over the last 2 days. Upon arrival, patient is A&O x4 and denies hallucinations. States he has history of panic attacks and feels like he is having one now.

## 2018-12-09 NOTE — Discharge Instructions (Signed)
At this time there is no evidence of urinary tract infection pneumonia head bleed anemia kidney issue, or other acute pathology causing him to be more anxious than normal.  As we discussed, treating and caring for a loved one with anxiety can be difficult; it does not appear that there is much we can do from the emergency department to alleviate this chronic problem however.  This is best managed by his primary care doctor and is certainly better to have only 1 cook in the kitchen when it comes to changing medication.  We therefore asked that you call his primary care doctor first thing tomorrow morning for further help with his meds.  To the extent that we can perform screening exams in the emergency department, we do not see anything that requires admission.  As you know he does have some significant compression fractions, and I understand that you have close follow-up with his back doctor for that.  We do advised that you do keep those appointments.  If he has any acute weakness please return to the emergency room.

## 2018-12-17 DIAGNOSIS — J181 Lobar pneumonia, unspecified organism: Secondary | ICD-10-CM | POA: Diagnosis not present

## 2018-12-17 DIAGNOSIS — I428 Other cardiomyopathies: Secondary | ICD-10-CM | POA: Diagnosis not present

## 2018-12-17 DIAGNOSIS — I480 Paroxysmal atrial fibrillation: Secondary | ICD-10-CM | POA: Diagnosis not present

## 2018-12-17 DIAGNOSIS — Z0000 Encounter for general adult medical examination without abnormal findings: Secondary | ICD-10-CM | POA: Diagnosis not present

## 2018-12-21 DIAGNOSIS — Z111 Encounter for screening for respiratory tuberculosis: Secondary | ICD-10-CM | POA: Diagnosis not present

## 2018-12-25 DIAGNOSIS — I509 Heart failure, unspecified: Secondary | ICD-10-CM | POA: Diagnosis not present

## 2018-12-25 DIAGNOSIS — J188 Other pneumonia, unspecified organism: Secondary | ICD-10-CM | POA: Diagnosis not present

## 2018-12-25 DIAGNOSIS — I110 Hypertensive heart disease with heart failure: Secondary | ICD-10-CM | POA: Diagnosis not present

## 2018-12-25 DIAGNOSIS — J439 Emphysema, unspecified: Secondary | ICD-10-CM | POA: Diagnosis not present

## 2019-01-21 ENCOUNTER — Other Ambulatory Visit: Payer: Self-pay | Admitting: Family Medicine

## 2019-01-21 DIAGNOSIS — N401 Enlarged prostate with lower urinary tract symptoms: Principal | ICD-10-CM

## 2019-01-21 DIAGNOSIS — N138 Other obstructive and reflux uropathy: Secondary | ICD-10-CM

## 2019-01-21 MED ORDER — FINASTERIDE 5 MG PO TABS: 5.0000 mg | ORAL_TABLET | Freq: Every day | ORAL | 3 refills | Status: AC

## 2019-02-15 ENCOUNTER — Emergency Department: Payer: Medicare Other

## 2019-02-15 ENCOUNTER — Other Ambulatory Visit: Payer: Self-pay

## 2019-02-15 ENCOUNTER — Inpatient Hospital Stay
Admission: EM | Admit: 2019-02-15 | Discharge: 2019-02-24 | DRG: 152 | Disposition: A | Discharge status: Home Health | Payer: Medicare Other | Attending: Internal Medicine | Admitting: Internal Medicine

## 2019-02-15 ENCOUNTER — Encounter: Payer: Self-pay | Admitting: Emergency Medicine

## 2019-02-15 DIAGNOSIS — Z79891 Long term (current) use of opiate analgesic: Secondary | ICD-10-CM | POA: Diagnosis not present

## 2019-02-15 DIAGNOSIS — M4856XA Collapsed vertebra, not elsewhere classified, lumbar region, initial encounter for fracture: Secondary | ICD-10-CM | POA: Diagnosis present

## 2019-02-15 DIAGNOSIS — J301 Allergic rhinitis due to pollen: Secondary | ICD-10-CM | POA: Diagnosis not present

## 2019-02-15 DIAGNOSIS — M419 Scoliosis, unspecified: Secondary | ICD-10-CM | POA: Diagnosis present

## 2019-02-15 DIAGNOSIS — I482 Chronic atrial fibrillation, unspecified: Secondary | ICD-10-CM | POA: Diagnosis present

## 2019-02-15 DIAGNOSIS — K5900 Constipation, unspecified: Secondary | ICD-10-CM | POA: Diagnosis present

## 2019-02-15 DIAGNOSIS — Z794 Long term (current) use of insulin: Secondary | ICD-10-CM

## 2019-02-15 DIAGNOSIS — R0902 Hypoxemia: Secondary | ICD-10-CM | POA: Diagnosis not present

## 2019-02-15 DIAGNOSIS — G9341 Metabolic encephalopathy: Secondary | ICD-10-CM | POA: Diagnosis not present

## 2019-02-15 DIAGNOSIS — I5031 Acute diastolic (congestive) heart failure: Secondary | ICD-10-CM | POA: Diagnosis not present

## 2019-02-15 DIAGNOSIS — J111 Influenza due to unidentified influenza virus with other respiratory manifestations: Principal | ICD-10-CM | POA: Diagnosis present

## 2019-02-15 DIAGNOSIS — Z8042 Family history of malignant neoplasm of prostate: Secondary | ICD-10-CM

## 2019-02-15 DIAGNOSIS — G8929 Other chronic pain: Secondary | ICD-10-CM | POA: Diagnosis not present

## 2019-02-15 DIAGNOSIS — F4310 Post-traumatic stress disorder, unspecified: Secondary | ICD-10-CM | POA: Diagnosis present

## 2019-02-15 DIAGNOSIS — I110 Hypertensive heart disease with heart failure: Secondary | ICD-10-CM | POA: Diagnosis not present

## 2019-02-15 DIAGNOSIS — J441 Chronic obstructive pulmonary disease with (acute) exacerbation: Secondary | ICD-10-CM | POA: Diagnosis not present

## 2019-02-15 DIAGNOSIS — R404 Transient alteration of awareness: Secondary | ICD-10-CM | POA: Diagnosis not present

## 2019-02-15 DIAGNOSIS — J101 Influenza due to other identified influenza virus with other respiratory manifestations: Secondary | ICD-10-CM

## 2019-02-15 DIAGNOSIS — I5032 Chronic diastolic (congestive) heart failure: Secondary | ICD-10-CM | POA: Diagnosis not present

## 2019-02-15 DIAGNOSIS — R0603 Acute respiratory distress: Secondary | ICD-10-CM | POA: Diagnosis not present

## 2019-02-15 DIAGNOSIS — R52 Pain, unspecified: Secondary | ICD-10-CM | POA: Diagnosis not present

## 2019-02-15 DIAGNOSIS — F17210 Nicotine dependence, cigarettes, uncomplicated: Secondary | ICD-10-CM | POA: Diagnosis not present

## 2019-02-15 DIAGNOSIS — G2581 Restless legs syndrome: Secondary | ICD-10-CM | POA: Diagnosis not present

## 2019-02-15 DIAGNOSIS — R410 Disorientation, unspecified: Secondary | ICD-10-CM | POA: Diagnosis not present

## 2019-02-15 DIAGNOSIS — F0390 Unspecified dementia without behavioral disturbance: Secondary | ICD-10-CM | POA: Diagnosis present

## 2019-02-15 DIAGNOSIS — J449 Chronic obstructive pulmonary disease, unspecified: Secondary | ICD-10-CM

## 2019-02-15 DIAGNOSIS — M4854XA Collapsed vertebra, not elsewhere classified, thoracic region, initial encounter for fracture: Secondary | ICD-10-CM | POA: Diagnosis present

## 2019-02-15 DIAGNOSIS — Z7982 Long term (current) use of aspirin: Secondary | ICD-10-CM

## 2019-02-15 DIAGNOSIS — Z7902 Long term (current) use of antithrombotics/antiplatelets: Secondary | ICD-10-CM

## 2019-02-15 DIAGNOSIS — Z66 Do not resuscitate: Secondary | ICD-10-CM | POA: Diagnosis present

## 2019-02-15 DIAGNOSIS — Z8249 Family history of ischemic heart disease and other diseases of the circulatory system: Secondary | ICD-10-CM

## 2019-02-15 DIAGNOSIS — I2782 Chronic pulmonary embolism: Secondary | ICD-10-CM | POA: Diagnosis present

## 2019-02-15 DIAGNOSIS — I4819 Other persistent atrial fibrillation: Secondary | ICD-10-CM | POA: Diagnosis not present

## 2019-02-15 DIAGNOSIS — Z7901 Long term (current) use of anticoagulants: Secondary | ICD-10-CM | POA: Diagnosis not present

## 2019-02-15 DIAGNOSIS — N400 Enlarged prostate without lower urinary tract symptoms: Secondary | ICD-10-CM | POA: Diagnosis present

## 2019-02-15 DIAGNOSIS — Z79899 Other long term (current) drug therapy: Secondary | ICD-10-CM

## 2019-02-15 DIAGNOSIS — Z8546 Personal history of malignant neoplasm of prostate: Secondary | ICD-10-CM | POA: Diagnosis not present

## 2019-02-15 DIAGNOSIS — J9601 Acute respiratory failure with hypoxia: Secondary | ICD-10-CM | POA: Diagnosis not present

## 2019-02-15 DIAGNOSIS — R0602 Shortness of breath: Secondary | ICD-10-CM | POA: Diagnosis not present

## 2019-02-15 DIAGNOSIS — I4891 Unspecified atrial fibrillation: Secondary | ICD-10-CM | POA: Diagnosis not present

## 2019-02-15 DIAGNOSIS — I10 Essential (primary) hypertension: Secondary | ICD-10-CM | POA: Diagnosis not present

## 2019-02-15 LAB — CBC
HCT: 53.4 % — ABNORMAL HIGH (ref 39.0–52.0)
Hemoglobin: 16.8 g/dL (ref 13.0–17.0)
MCH: 29.2 pg (ref 26.0–34.0)
MCHC: 31.5 g/dL (ref 30.0–36.0)
MCV: 92.9 fL (ref 80.0–100.0)
PLATELETS: 229 10*3/uL (ref 150–400)
RBC: 5.75 MIL/uL (ref 4.22–5.81)
RDW: 14.8 % (ref 11.5–15.5)
WBC: 16.2 10*3/uL — ABNORMAL HIGH (ref 4.0–10.5)
nRBC: 0 % (ref 0.0–0.2)

## 2019-02-15 LAB — BLOOD GAS, VENOUS
Acid-Base Excess: 6.3 mmol/L — ABNORMAL HIGH (ref 0.0–2.0)
Bicarbonate: 33.1 mmol/L — ABNORMAL HIGH (ref 20.0–28.0)
O2 Saturation: 55.8 %
PCO2 VEN: 56 mmHg (ref 44.0–60.0)
Patient temperature: 37
pH, Ven: 7.38 (ref 7.250–7.430)

## 2019-02-15 LAB — CBC WITH DIFFERENTIAL/PLATELET
Abs Immature Granulocytes: 0.13 10*3/uL — ABNORMAL HIGH (ref 0.00–0.07)
Basophils Absolute: 0.1 10*3/uL (ref 0.0–0.1)
Basophils Relative: 0 %
Eosinophils Absolute: 0.1 10*3/uL (ref 0.0–0.5)
Eosinophils Relative: 0 %
HCT: 51.9 % (ref 39.0–52.0)
Hemoglobin: 16.6 g/dL (ref 13.0–17.0)
IMMATURE GRANULOCYTES: 1 %
LYMPHS PCT: 4 %
Lymphs Abs: 0.6 10*3/uL — ABNORMAL LOW (ref 0.7–4.0)
MCH: 29.3 pg (ref 26.0–34.0)
MCHC: 32 g/dL (ref 30.0–36.0)
MCV: 91.7 fL (ref 80.0–100.0)
Monocytes Absolute: 1.3 10*3/uL — ABNORMAL HIGH (ref 0.1–1.0)
Monocytes Relative: 8 %
NEUTROS ABS: 13.7 10*3/uL — AB (ref 1.7–7.7)
Neutrophils Relative %: 87 %
PLATELETS: 230 10*3/uL (ref 150–400)
RBC: 5.66 MIL/uL (ref 4.22–5.81)
RDW: 14.9 % (ref 11.5–15.5)
SMEAR REVIEW: ADEQUATE
WBC: 15.8 10*3/uL — ABNORMAL HIGH (ref 4.0–10.5)
nRBC: 0 % (ref 0.0–0.2)

## 2019-02-15 LAB — COMPREHENSIVE METABOLIC PANEL
ALK PHOS: 81 U/L (ref 38–126)
ALT: 12 U/L (ref 0–44)
AST: 21 U/L (ref 15–41)
Albumin: 3.9 g/dL (ref 3.5–5.0)
Anion gap: 10 (ref 5–15)
BILIRUBIN TOTAL: 1.3 mg/dL — AB (ref 0.3–1.2)
BUN: 13 mg/dL (ref 8–23)
CO2: 28 mmol/L (ref 22–32)
Calcium: 9 mg/dL (ref 8.9–10.3)
Chloride: 101 mmol/L (ref 98–111)
Creatinine, Ser: 0.86 mg/dL (ref 0.61–1.24)
GFR calc Af Amer: >60 mL/min (ref >60)
GFR calc non Af Amer: >60 mL/min (ref >60)
Glucose, Bld: 109 mg/dL — ABNORMAL HIGH (ref 70–99)
Potassium: 4.1 mmol/L (ref 3.5–5.1)
Sodium: 139 mmol/L (ref 135–145)
Total Protein: 6.8 g/dL (ref 6.5–8.1)

## 2019-02-15 LAB — TROPONIN I: Troponin I: <0.03 ng/mL (ref <0.03)

## 2019-02-15 LAB — MRSA PCR SCREENING: MRSA by PCR: NEGATIVE

## 2019-02-15 LAB — INFLUENZA PANEL BY PCR (TYPE A & B)
Influenza A By PCR: POSITIVE — AB
Influenza B By PCR: NEGATIVE

## 2019-02-15 LAB — GLUCOSE, CAPILLARY: Glucose-Capillary: 100 mg/dL — ABNORMAL HIGH (ref 70–99)

## 2019-02-15 LAB — LACTIC ACID, PLASMA: Lactic Acid, Venous: 1.6 mmol/L (ref 0.5–1.9)

## 2019-02-15 LAB — PROTIME-INR
INR: 1.1 (ref 0.8–1.2)
Prothrombin Time: 14.1 seconds (ref 11.4–15.2)

## 2019-02-15 LAB — BRAIN NATRIURETIC PEPTIDE: B Natriuretic Peptide: 180 pg/mL — ABNORMAL HIGH (ref 0.0–100.0)

## 2019-02-15 MED ORDER — MEMANTINE HCL 5 MG PO TABS
5.0000 mg | ORAL_TABLET | Freq: Two times a day (BID) | ORAL | Status: DC
  Administered 2019-02-16 – 2019-02-24 (×17): 5 mg via ORAL
  Filled 2019-02-15 (×19): qty 1

## 2019-02-15 MED ORDER — DILTIAZEM HCL 25 MG/5ML IV SOLN
10.0000 mg | Freq: Once | INTRAVENOUS | Status: AC
  Administered 2019-02-15: 10 mg via INTRAVENOUS
  Filled 2019-02-15: qty 5

## 2019-02-15 MED ORDER — PRAMIPEXOLE DIHYDROCHLORIDE 0.25 MG PO TABS
1.0000 mg | ORAL_TABLET | Freq: Every day | ORAL | Status: DC
  Administered 2019-02-15 – 2019-02-23 (×8): 1 mg via ORAL
  Filled 2019-02-15 (×2): qty 4
  Filled 2019-02-15: qty 1
  Filled 2019-02-15: qty 4
  Filled 2019-02-15 (×2): qty 1
  Filled 2019-02-15 (×2): qty 4
  Filled 2019-02-15: qty 1

## 2019-02-15 MED ORDER — IPRATROPIUM-ALBUTEROL 0.5-2.5 (3) MG/3ML IN SOLN
3.0000 mL | Freq: Four times a day (QID) | RESPIRATORY_TRACT | Status: DC
  Administered 2019-02-16 – 2019-02-24 (×33): 3 mL via RESPIRATORY_TRACT
  Filled 2019-02-15 (×32): qty 3

## 2019-02-15 MED ORDER — OSELTAMIVIR PHOSPHATE 75 MG PO CAPS
75.0000 mg | ORAL_CAPSULE | Freq: Two times a day (BID) | ORAL | Status: AC
  Administered 2019-02-15 – 2019-02-20 (×10): 75 mg via ORAL
  Filled 2019-02-15 (×10): qty 1

## 2019-02-15 MED ORDER — APIXABAN 5 MG PO TABS
5.0000 mg | ORAL_TABLET | Freq: Two times a day (BID) | ORAL | Status: DC
  Administered 2019-02-15 – 2019-02-24 (×18): 5 mg via ORAL
  Filled 2019-02-15 (×18): qty 1

## 2019-02-15 MED ORDER — OSELTAMIVIR PHOSPHATE 75 MG PO CAPS
75.0000 mg | ORAL_CAPSULE | Freq: Once | ORAL | Status: DC
  Filled 2019-02-15: qty 1

## 2019-02-15 MED ORDER — ONDANSETRON HCL 4 MG/2ML IJ SOLN: 4.0000 mg | Freq: Four times a day (QID) | INTRAMUSCULAR | Status: DC | PRN

## 2019-02-15 MED ORDER — FUROSEMIDE 20 MG PO TABS
20.0000 mg | ORAL_TABLET | ORAL | Status: DC
  Administered 2019-02-16: 20 mg via ORAL
  Filled 2019-02-15: qty 1

## 2019-02-15 MED ORDER — FLUOXETINE HCL 10 MG PO CAPS
10.0000 mg | ORAL_CAPSULE | Freq: Every day | ORAL | Status: DC
  Administered 2019-02-16 – 2019-02-24 (×9): 10 mg via ORAL
  Filled 2019-02-15 (×9): qty 1

## 2019-02-15 MED ORDER — FINASTERIDE 5 MG PO TABS
5.0000 mg | ORAL_TABLET | Freq: Every day | ORAL | Status: DC
  Administered 2019-02-16 – 2019-02-24 (×9): 5 mg via ORAL
  Filled 2019-02-15 (×9): qty 1

## 2019-02-15 MED ORDER — ACETAMINOPHEN 650 MG RE SUPP: 650.0000 mg | Freq: Four times a day (QID) | RECTAL | Status: DC | PRN

## 2019-02-15 MED ORDER — CHLORHEXIDINE GLUCONATE 0.12 % MT SOLN
15.0000 mL | Freq: Two times a day (BID) | OROMUCOSAL | Status: DC
  Administered 2019-02-16 – 2019-02-23 (×12): 15 mL via OROMUCOSAL
  Filled 2019-02-15 (×13): qty 15

## 2019-02-15 MED ORDER — ALBUTEROL SULFATE (2.5 MG/3ML) 0.083% IN NEBU
2.5000 mg | INHALATION_SOLUTION | Freq: Four times a day (QID) | RESPIRATORY_TRACT | Status: DC
  Administered 2019-02-15: 2.5 mg via RESPIRATORY_TRACT
  Filled 2019-02-15: qty 3

## 2019-02-15 MED ORDER — MOMETASONE FURO-FORMOTEROL FUM 200-5 MCG/ACT IN AERO
2.0000 | INHALATION_SPRAY | Freq: Two times a day (BID) | RESPIRATORY_TRACT | Status: DC
  Filled 2019-02-15: qty 8.8

## 2019-02-15 MED ORDER — ORAL CARE MOUTH RINSE
15.0000 mL | Freq: Two times a day (BID) | OROMUCOSAL | Status: DC
  Administered 2019-02-16 – 2019-02-19 (×8): 15 mL via OROMUCOSAL

## 2019-02-15 MED ORDER — ALBUTEROL SULFATE (2.5 MG/3ML) 0.083% IN NEBU
2.5000 mg | INHALATION_SOLUTION | RESPIRATORY_TRACT | Status: DC | PRN
  Administered 2019-02-19: 2.5 mg via RESPIRATORY_TRACT
  Filled 2019-02-15: qty 3

## 2019-02-15 MED ORDER — LORAZEPAM 2 MG/ML IJ SOLN
1.0000 mg | Freq: Four times a day (QID) | INTRAMUSCULAR | Status: DC | PRN
  Administered 2019-02-18 – 2019-02-19 (×3): 1 mg via INTRAVENOUS
  Filled 2019-02-15 (×4): qty 1

## 2019-02-15 MED ORDER — LORAZEPAM 2 MG/ML IJ SOLN
0.5000 mg | Freq: Once | INTRAMUSCULAR | Status: AC
  Administered 2019-02-15: 0.5 mg via INTRAVENOUS
  Filled 2019-02-15: qty 1

## 2019-02-15 MED ORDER — METOPROLOL SUCCINATE ER 50 MG PO TB24: 50.0000 mg | ORAL_TABLET | Freq: Every day | ORAL | Status: DC

## 2019-02-15 MED ORDER — ONDANSETRON HCL 4 MG PO TABS: 4.0000 mg | ORAL_TABLET | Freq: Four times a day (QID) | ORAL | Status: DC | PRN

## 2019-02-15 MED ORDER — TRAMADOL HCL 50 MG PO TABS
50.0000 mg | ORAL_TABLET | Freq: Four times a day (QID) | ORAL | Status: DC | PRN
  Administered 2019-02-16 – 2019-02-20 (×3): 50 mg via ORAL
  Filled 2019-02-15 (×3): qty 1

## 2019-02-15 MED ORDER — IOPAMIDOL (ISOVUE-370) INJECTION 76%
75.0000 mL | Freq: Once | INTRAVENOUS | Status: AC | PRN
  Administered 2019-02-15: 75 mL via INTRAVENOUS

## 2019-02-15 MED ORDER — TAMSULOSIN HCL 0.4 MG PO CAPS
0.8000 mg | ORAL_CAPSULE | Freq: Every day | ORAL | Status: DC
  Administered 2019-02-15 – 2019-02-23 (×8): 0.8 mg via ORAL
  Filled 2019-02-15 (×9): qty 2

## 2019-02-15 MED ORDER — FUROSEMIDE 10 MG/ML IJ SOLN
40.0000 mg | Freq: Once | INTRAMUSCULAR | Status: AC
  Administered 2019-02-15: 40 mg via INTRAVENOUS
  Filled 2019-02-15: qty 4

## 2019-02-15 MED ORDER — OLANZAPINE 2.5 MG PO TABS
1.2500 mg | ORAL_TABLET | Freq: Every day | ORAL | Status: DC
  Administered 2019-02-15 – 2019-02-23 (×9): 1.25 mg via ORAL
  Filled 2019-02-15 (×11): qty 0.5

## 2019-02-15 MED ORDER — BUDESONIDE 0.5 MG/2ML IN SUSP
0.5000 mg | Freq: Two times a day (BID) | RESPIRATORY_TRACT | Status: DC
  Administered 2019-02-15 – 2019-02-18 (×6): 0.5 mg via RESPIRATORY_TRACT
  Filled 2019-02-15 (×6): qty 2

## 2019-02-15 MED ORDER — ACETAMINOPHEN 325 MG PO TABS: 650.0000 mg | ORAL_TABLET | Freq: Four times a day (QID) | ORAL | Status: DC | PRN

## 2019-02-15 MED ORDER — NICOTINE 14 MG/24HR TD PT24
14.0000 mg | MEDICATED_PATCH | Freq: Every day | TRANSDERMAL | Status: DC
  Administered 2019-02-16 – 2019-02-24 (×9): 14 mg via TRANSDERMAL
  Filled 2019-02-15 (×9): qty 1

## 2019-02-15 MED ORDER — TIOTROPIUM BROMIDE MONOHYDRATE 18 MCG IN CAPS
18.0000 ug | ORAL_CAPSULE | Freq: Every day | RESPIRATORY_TRACT | Status: DC
  Administered 2019-02-16 – 2019-02-17 (×2): 18 ug via RESPIRATORY_TRACT
  Filled 2019-02-15: qty 5

## 2019-02-15 NOTE — ED Provider Notes (Signed)
[facility] Emergency Department Provider Note       Time seen: ----------------------------------------- 4:04 PM on 02/15/2019 -----------------------------------------  Level V caveat: History/ROS limited by aspiratory distress I have reviewed the triage vital signs and the nursing notes.  HISTORY   Chief Complaint Altered Mental Status   HPI Ricardo Erickson is a 78 y.o. male with a history of CHF, chronic back pain, dementia, depression, emphysema, hypertension, PTSD, PE who presents to the ED for respiratory distress.  Patient arrives by EMS from [facility].  Facility called EMS for altered mental status.  He was noted to be hyperventilating, anxious and confused.  He has been incontinent which is new for him.  Patient is asking for help with his breathing on arrival.  Past Medical History:  Diagnosis Date  . CHF (congestive heart failure) ([place])   . Chronic back pain   . Dementia ([name])   . Depression   . Emphysema lung ([place])   . Hypertension   . Personal history of tobacco use, presenting hazards to health 08/06/2015  . Pollen allergies   . PTSD (post-traumatic stress disorder)   . Pulmonary embolism [facility])     Patient Active Problem List   Diagnosis Date Noted  . Pneumonia 11/27/2018  . Pulmonary emboli ([place]) 11/18/2017  . Tobacco use disorder 07/24/2017  . Encephalopathy   . Palliative care encounter   . Goals of care, counseling/discussion   . Pulmonary embolus ([place])   . UTI (urinary tract infection) 07/19/2017  . PTSD (post-traumatic stress disorder) 06/28/2017  . Adjustment disorder with anxiety 06/28/2017  . Closed compression fracture of L5 lumbar vertebra   . Left low back pain   . Weakness   . A-fib ([place]) 06/24/2017  . Prediabetes 01/18/2017  . Prostate cancer ([place]) 01/18/2017  . Anxiety 01/18/2017  . Preventative health care 04/15/2016  . Restless leg syndrome 04/03/2016  . COPD (chronic obstructive pulmonary disease) ([place])  04/03/2016  . BPH (benign prostatic hyperplasia) 04/03/2016  . Personal history of tobacco use, presenting hazards to health 08/06/2015    Past Surgical History:  Procedure Laterality Date  . BACK SURGERY    . KYPHOPLASTY N/A 06/27/2017   Procedure: KYPHOPLASTY -L5;  Surgeon: Hessie Knows, MD;  Location: [facility] ORS;  Service: Orthopedics;  Laterality: N/A;  . [place]    Allergies Patient has no known allergies.  Social History Social History   Tobacco Use  . Smoking status: Current Every Day Smoker    Packs/day: 0.50    Years: 60.00    Pack years: 30.00    Types: Cigarettes  . Smokeless tobacco: Never Used  Substance Use Topics  . Alcohol use: No    Alcohol/week: 0.0 standard drinks  . Drug use: No   Review of Systems Constitutional: Negative for fever. Cardiovascular: Negative for chest pain. Respiratory: Negative for shortness of breath. Gastrointestinal: Negative for abdominal pain, vomiting and diarrhea. Genitourinary: Negative for dysuria. Musculoskeletal: Negative for back pain. Skin: Negative for rash. Neurological: Negative for headaches, focal weakness or numbness.  All systems negative/normal/unremarkable except as stated in the HPI  ____________________________________________   PHYSICAL EXAM:  VITAL SIGNS: ED Triage Vitals  Enc Vitals Group     BP 02/15/19 1414 131/77     Pulse Rate 02/15/19 1414 (!) 110     Resp 02/15/19 1414 (!) 30     Temp 02/15/19 1414 98.2 F (36.8 C)     Temp Source 02/15/19 1414 Oral  SpO2 02/15/19 1414 91 %     Weight 02/15/19 1413 210 lb (95.3 kg)     Height 02/15/19 1413 5\' 10"  (1.778 m)     Head Circumference --      Peak Flow --      Pain Score --      Pain Loc --      Pain Edu? --      Excl. in [place]? --     Constitutional: Alert and oriented.  Mild to moderate distress Eyes: Conjunctivae are normal. Normal extraocular movements. ENT      Head: Normocephalic and atraumatic.       Nose: No congestion/rhinnorhea.      Mouth/Throat: Mucous membranes are moist.      Neck: No stridor. Cardiovascular: Rapid rate, regular rhythm. No murmurs, rubs, or gallops. Respiratory: Tachypnea with some crackles bilaterally Gastrointestinal: Soft and nontender. Normal bowel sounds Musculoskeletal: Nontender with normal range of motion in extremities.  Bilateral edema is noted Neurologic:  Normal speech and language. No gross focal neurologic deficits are appreciated.  Skin:  Skin is warm, dry and intact. No rash noted. Psychiatric: anxious mood and affect ____________________________________________  EKG: Interpreted by me.  Atrial fibrillation with a rate of 127 bpm, right bundle branch block, normal QT.  ____________________________________________  ED COURSE:  As part of my medical decision making, I reviewed the following data within the [place] History obtained from family if available, nursing notes, old chart and ekg, as well as notes from prior ED visits. Patient presented for respiratory distress, we will assess with labs and imaging as indicated at this time. Clinical Course as of Feb 14 1618  Fri Feb 15, 2019  1609 Influenza A By PCR(!): POSITIVE [JW]    Clinical Course User Index [JW] Earleen Newport, MD   Procedures ____________________________________________   LABS (pertinent positives/negatives)  Labs Reviewed  COMPREHENSIVE METABOLIC PANEL - Abnormal; Notable for the following components:      Result Value   Glucose, Bld 109 (*)    Total Bilirubin 1.3 (*)    All other components within normal limits  CBC - Abnormal; Notable for the following components:   WBC 16.2 (*)    HCT 53.4 (*)    All other components within normal limits  BLOOD GAS, VENOUS - Abnormal; Notable for the following components:   Bicarbonate 33.1 (*)    Acid-Base Excess 6.3 (*)    All other components within normal limits  CBC WITH DIFFERENTIAL/PLATELET  - Abnormal; Notable for the following components:   WBC 15.8 (*)    Neutro Abs 13.7 (*)    Lymphs Abs 0.6 (*)    Monocytes Absolute 1.3 (*)    Abs Immature Granulocytes 0.13 (*)    All other components within normal limits  CULTURE, BLOOD (ROUTINE X 2)  CULTURE, BLOOD (ROUTINE X 2)  TROPONIN I  LACTIC ACID, PLASMA  LACTIC ACID, PLASMA  PROTIME-INR  URINALYSIS, COMPLETE (UACMP) WITH MICROSCOPIC  DIFFERENTIAL  INFLUENZA PANEL BY PCR (TYPE A & B)  BRAIN NATRIURETIC PEPTIDE   CRITICAL CARE Performed by: Laurence Aly   Total critical care time: 30 minutes  Critical care time was exclusive of separately billable procedures and treating other patients.  Critical care was necessary to treat or prevent imminent or life-threatening deterioration.  Critical care was time spent personally by me on the following activities: development of treatment plan with patient and/or surrogate as well as nursing, discussions with consultants, evaluation  of patient's response to treatment, examination of patient, obtaining history from patient or surrogate, ordering and performing treatments and interventions, ordering and review of laboratory studies, ordering and review of radiographic studies, pulse oximetry and re-evaluation of patient's condition.  RADIOLOGY Images were viewed by me  Chest x-ray IMPRESSION: No active cardiopulmonary disease. Stable cardiomegaly and scarring at the left base. CTA IMPRESSION: 1. Single small, band-like filling defect in a proximal left lower lobe pulmonary artery. 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 No acute pulmonary emboli are seen. 2. Interval approximately 40% T10 vertebral compression deformity with interval sclerosis and gas in the adjacent discs. This is compatible with a subacute, healing fracture. 3. Stable previously demonstrated severe L1 vertebral compression deformity and minimal  T5, T6 and T9 vertebral compression deformities with interval partial inclusion of an old severe L2 compression deformity. 4. Mild bronchitic changes. 5.  Calcific coronary artery and aortic atherosclerosis.  Aortic Atherosclerosis (ICD10-I70.0). ____________________________________________   DIFFERENTIAL DIAGNOSIS   Respiratory failure, pneumonia, influenza, PE, CHF, COPD  FINAL ASSESSMENT AND PLAN  Acute respiratory failure, rapid atrial fibrillation, influenza A  Plan: The patient had presented for respiratory failure.  Patient's blood gas was unremarkable, he did test positive for influenza A.  Patient did have leukocytosis with a white count of 16,000, chemistries and troponin were negative.  Lactic acid was also normal.  Patient appeared to be in acute respiratory distress on arrival and was placed on BiPAP. Patient's imaging did reveal chronic PE of which the family was aware.  He does have a new or subacute compression fracture.  I will start him on Tamiflu for influenza, he will continue on BiPAP.   Laurence Aly, MD    Note: This note was generated in part or whole with voice recognition software. Voice recognition is usually quite accurate but there are transcription errors that can and very often do occur. I apologize for any typographical errors that were not detected and corrected.     Earleen Newport, MD 02/15/19 [phone]

## 2019-02-15 NOTE — Progress Notes (Signed)
Family Meeting Note  Advance Directive:yes  Today a meeting took place with the Patient and son.  Patient is unable to participate due UD:APTCKF capacity Sedated and and on BiPAP   The following clinical team members were present during this meeting:MD  The following were discussed:Patient's diagnosis: influenza A, Patient's progosis: Unable to determine and Goals for treatment: Full Code  Additional follow-up to be provided: prn  Time spent during discussion:20 minutes  Evette Doffing, MD

## 2019-02-15 NOTE — Progress Notes (Signed)
Pt able to take most pm medications on 6L [place].  See MAR.  Pt stated that he was "tired."  Placed back on Bi-PAP at previous settings.

## 2019-02-15 NOTE — ED Notes (Signed)
Pt had wet diaper, unable to measure.  Diaper changed and linens.

## 2019-02-15 NOTE — H&P (Addendum)
[place] [facility] [place] NAME: Ricardo Erickson    MR#:  [id number]  DATE OF BIRTH:  [birth year]  DATE OF ADMISSION:  02/15/2019  PRIMARY CARE PHYSICIAN: Rusty Aus, MD   REQUESTING/REFERRING PHYSICIAN: Lenise Arena, MD  CHIEF COMPLAINT:   Chief Complaint  Patient presents with  . Altered Mental Status    HISTORY OF PRESENT ILLNESS:  Ricardo Erickson  is a 78 y.o. male with a known history of CHF, depression, hypertension, PE, dementia, chronic atrial fibrillation who presented to the ED from [facility] with shortness of breath, tachypnea, and altered mental status.  This morning, he had sore throat and felt that he was wheezing.  His shortness of breath got significantly worse throughout the day.  He denies any chest pain, fevers, chills.  He denies any muscle aches.  At baseline, he uses a walker and can get around pretty well, per son.  In the ED, he was meeting sepsis criteria with respiratory rates in the 30s, heart rates in the 130s initially, and WBC 16.2. He had significantly increased work of breathing and was placed on BiPAP.  ABG was unremarkable.  Influenza A test was positive.  Hospitalists were called for admission.  PAST MEDICAL HISTORY:   Past Medical History:  Diagnosis Date  . CHF (congestive heart failure) ([place])   . Chronic back pain   . Dementia ([place])   . Depression   . Emphysema lung ([place])   . Hypertension   . Personal history of tobacco use, presenting hazards to health 08/06/2015  . Pollen allergies   . PTSD (post-traumatic stress disorder)   . Pulmonary embolism ([place])     PAST SURGICAL HISTORY:   Past Surgical History:  Procedure Laterality Date  . BACK SURGERY    . KYPHOPLASTY N/A 06/27/2017   Procedure: KYPHOPLASTY -L5;  Surgeon: Hessie Knows, MD;  Location: [facility] ORS;  Service: Orthopedics;  Laterality: N/A;  . TONSILLECTOMY AND ADENOIDECTOMY  1980    SOCIAL HISTORY:   Social  History   Tobacco Use  . Smoking status: Current Every Day Smoker    Packs/day: 0.50    Years: 60.00    Pack years: 30.00    Types: Cigarettes  . Smokeless tobacco: Never Used  Substance Use Topics  . Alcohol use: No    Alcohol/week: 0.0 standard drinks    FAMILY HISTORY:   Family History  Problem Relation Age of Onset  . Alcohol abuse Father   . Arthritis Mother   . Heart disease Maternal Grandmother   . Prostate cancer Brother     DRUG ALLERGIES:  No Known Allergies  REVIEW OF SYSTEMS:   ROS-able to obtain due to patient being on BiPAP  MEDICATIONS AT HOME:   Prior to Admission medications   Medication Sig Start Date End Date Taking? Authorizing Provider  acetaminophen (TYLENOL) 650 MG CR tablet Take 650 mg by mouth every 8 (eight) hours as needed for pain.    [provider]  ADVAIR DISKUS 250-50 MCG/DOSE AEPB Inhale 1 puff into the lungs every 12 (twelve) hours. 04/09/18   [provider]  albuterol (PROVENTIL HFA;VENTOLIN HFA) 108 (90 Base) MCG/ACT inhaler Inhale 2 puffs into the lungs every 4 (four) hours as needed for wheezing or shortness of breath.     [provider]  albuterol (PROVENTIL) (2.5 MG/3ML) 0.083% nebulizer solution Inhale 2.5 mg into the lungs 4 (four) times daily.  01/15/15   [provider]  apixaban (ELIQUIS) 5 MG TABS tablet Take 1 tablet (5 mg total) by mouth 2 (two) times daily. 11/28/17   Gladstone Lighter, MD  finasteride (PROSCAR) 5 MG tablet Take 1 tablet (5 mg total) by mouth daily. 01/21/19   Festus Aloe, MD  FLUoxetine (PROZAC) 10 MG capsule Take 10 mg by mouth daily.    [provider]  furosemide (LASIX) 20 MG tablet Take 20 mg by mouth every morning.  06/29/18   [provider]  lidocaine (LIDODERM) 5 % Place 1 patch onto the skin daily. Remove & Discard patch within 12 hours or as directed by MD 11/28/18   Gladstone Lighter, MD  metoprolol succinate (TOPROL-XL) 50 MG 24 hr  tablet Take 50 mg by mouth daily. Take with or immediately following a meal.    [provider]  OLANZapine (ZYPREXA) 2.5 MG tablet Take 1.25 mg by mouth at bedtime.    [provider]  potassium chloride (K-DUR) 10 MEQ tablet Take 10 mEq by mouth daily. 06/27/18 06/27/19  [provider]  pramipexole (MIRAPEX) 1 MG tablet Take 1 tablet (1 mg total) by mouth at bedtime. 11/22/17   Gladstone Lighter, MD  SPIRIVA HANDIHALER 18 MCG inhalation capsule Place 18 mcg into inhaler and inhale daily. 06/11/18   [provider]  tamsulosin (FLOMAX) 0.4 MG CAPS capsule Take 2 capsules (0.8 mg total) by mouth daily after supper. 02/02/18   Festus Aloe, MD  traMADol (ULTRAM) 50 MG tablet Take 1 tablet (50 mg total) by mouth every 6 (six) hours as needed. Patient taking differently: Take 50 mg by mouth 2 (two) times daily.  03/07/18   Lisa Roca, MD      VITAL SIGNS:  Blood pressure 132/87, pulse 70, temperature 98.2 F (36.8 C), temperature source Oral, resp. rate (!) 24, height 5\' 10"  (1.778 m), weight 95.3 kg, SpO2 100 %.  PHYSICAL EXAMINATION:  Physical Exam  GENERAL:  78 y.o.-year-old patient lying in the bed with no acute distress.  Resting comfortably. EYES: Pupils equal, round, reactive to light and accommodation. No scleral icterus. Extraocular muscles intact.  HEENT: Head atraumatic, normocephalic. Oropharynx and nasopharynx clear.  NECK:  Supple, no jugular venous distention. No thyroid enlargement, no tenderness.  LUNGS: + Diminished air movement throughout all lung fields.  No wheezing or crackles.  No use of accessory muscles of respiration.  BiPAP in place CARDIOVASCULAR: Tachycardic, irregularly irregular rhythm, S1, S2 normal. No murmurs, rubs, or gallops.  ABDOMEN: Soft, nontender, nondistended. Bowel sounds present. No organomegaly or mass.  EXTREMITIES: No pedal edema, cyanosis, or clubbing.  NEUROLOGIC: Unable to assess PSYCHIATRIC: Unable to  assess SKIN: No obvious rash, lesion, or ulcer.   LABORATORY PANEL:   CBC Recent Labs  Lab 02/15/19 1418  WBC 16.2*  HGB 16.8  HCT 53.4*  PLT 229   ------------------------------------------------------------------------------------------------------------------  Chemistries  Recent Labs  Lab 02/15/19 1418  NA 139  K 4.1  CL 101  CO2 28  GLUCOSE 109*  BUN 13  CREATININE 0.86  CALCIUM 9.0  AST 21  ALT 12  ALKPHOS 81  BILITOT 1.3*   ------------------------------------------------------------------------------------------------------------------  Cardiac Enzymes Recent Labs  Lab 02/15/19 1418  TROPONINI <0.03   ------------------------------------------------------------------------------------------------------------------  RADIOLOGY:  Dg Chest 2 View  Result Date: 02/15/2019 CLINICAL DATA:  Shortness of breath EXAM: CHEST - 2 VIEW COMPARISON:  12/09/2018 FINDINGS: Mild cardiomegaly. Irregular scarring at the lingula. No acute airspace disease. Aortic atherosclerosis. No pneumothorax. Multiple marked compression fractures of the  lower spine. IMPRESSION: No active cardiopulmonary disease. Stable cardiomegaly and scarring at the left base. Electronically Signed   By: Donavan Foil M.D.   On: 02/15/2019 14:57   Ct Angio Chest Pe W And/or Wo Contrast  Result Date: 02/15/2019 CLINICAL DATA:  Shortness of breath. EXAM: CT ANGIOGRAPHY CHEST WITH CONTRAST TECHNIQUE: Multidetector CT imaging of the chest was performed using the standard protocol during bolus administration of intravenous contrast. Multiplanar CT image reconstructions and MIPs were obtained to evaluate the vascular anatomy. CONTRAST:  58mL ISOVUE-370 IOPAMIDOL (ISOVUE-370) INJECTION 76% COMPARISON:  Chest radiographs obtained earlier today. Chest CT dated 08/22/2018. FINDINGS: Cardiovascular: Single small, band-like filling defect in a proximal left lower lobe pulmonary artery. No other pulmonary arterial filling  defects are seen. Atheromatous calcifications, including the coronary arteries and aorta. Mildly enlarged heart. Mediastinum/Nodes: No enlarged mediastinal, hilar, or axillary lymph nodes. Thyroid gland, trachea, and esophagus demonstrate no significant findings. Lungs/Pleura: Mild diffuse peribronchial thickening. No airspace consolidation. No pleural fluid. Minimal linear scarring at the left lung base. Upper Abdomen: Unremarkable. Musculoskeletal: Multiple old left rib fractures. Stable previously demonstrated severe L1 vertebral compression deformity. Partially included similar deformity of the L2 vertebral body. Interval approximately 40% T10 vertebral compression deformity with interval sclerosis. Interval gas in the adjacent discs. No endplate irregularity or bone destruction. There are two visible fracture lines at the anterior superior aspect of the T10 vertebral body. Minimal bony retropulsion Stable minimal T5, T6 and T9 vertebral compression deformities. Review of the MIP images confirms the above findings. IMPRESSION: 1. Single small, band-like filling defect in a proximal left lower lobe pulmonary artery. 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 No acute pulmonary emboli are seen. 2. Interval approximately 40% T10 vertebral compression deformity with interval sclerosis and gas in the adjacent discs. This is compatible with a subacute, healing fracture. 3. Stable previously demonstrated severe L1 vertebral compression deformity and minimal T5, T6 and T9 vertebral compression deformities with interval partial inclusion of an old severe L2 compression deformity. 4. Mild bronchitic changes. 5.  Calcific coronary artery and aortic atherosclerosis. Aortic Atherosclerosis (ICD10-I70.0). Electronically Signed   By: Claudie Revering M.D.   On: 02/15/2019 16:00      IMPRESSION AND PLAN:   Acute hypoxic respiratory failure secondary to influenza A- will keep  patient on BiPAP and admit to the stepdown unit.  Continue Tamiflu.  Duonebs as needed.  Supportive care.  Chronic atrial fibrillation-in RVR on arrival to the ED.  Heart rates improved with a dose of IV diltiazem.  Continue home metoprolol.  Cardiac monitoring.  Multiple thoracic/lumbar compression fractures-seen on CTA chest.  Tylenol and tramadol as needed for pain.  PT consult when appropriate.  Hypertension- normotensive in the ED.  Continue home metoprolol.  COPD- no active wheezing.  Continue home inhalers.  Duonebs PRN.  Diastolic congestive heart failure-stable.  No signs of volume overload.  Continue home Lasix 20 mg p.o. daily.  Chronic PE-seen on CTA chest.  Family is already aware of this. Continue Eliquis   Anxiety- continue Prozac  Dementia-continue Zyprexa and Namenda  BPH-continue Proscar and and Flomax  Tobacco abuse-nicotine patch as needed  All the records are reviewed and case discussed with ED provider. Management plans discussed with the patient, family and they are in agreement.  CODE STATUS: Full  TOTAL TIME TAKING CARE OF THIS PATIENT: 45 minutes.    Berna Spare Mayo M.D on 02/15/2019 at 4:58 PM  Between 7am to  6pm - Pager - [phone]  After 6pm go to [email or web address] - Proofreader  [facility]  Office  [phone]  CC: Primary care physician; Rusty Aus, MD   Note: This dictation was prepared with Dragon dictation along with smaller phrase technology. Any transcriptional errors that result from this process are unintentional.

## 2019-02-15 NOTE — Consult Note (Addendum)
PULMONARY / CRITICAL CARE MEDICINE  Name: Ricardo Erickson MRN: [id number] DOB: [birth year]    LOS: 0  Referring Provider: Dr. Brett Albino Reason for Referral: Acute respiratory failure and influenza A infection Brief patient description: 78 year old male admitted with influenza A infection and acute hypoxic respiratory failure requiring BiPAP  HPI: This is a 78 year old male with a medical history as indicated below who presented to the ED from an independent living facility with shortness of breath, tachypnea and decreased consciousness.  Per ED records, patient indicated that he started feeling sick about a day ago with a sore throat and wheezing.  Symptoms gradually got worse hence he decided to come to the emergency room.  At the ED, his work-up was positive for influenza A and an elevated WBC.  He was placed on BiPAP and admitted to the ICU for further management.  Past Medical History:  Diagnosis Date  . CHF (congestive heart failure) ([place])   . Chronic back pain   . Dementia ([place])   . Depression   . Emphysema lung ([place])   . Hypertension   . Personal history of tobacco use, presenting hazards to health 08/06/2015  . Pollen allergies   . PTSD (post-traumatic stress disorder)   . Pulmonary embolism [facility])    Past Surgical History:  Procedure Laterality Date  . BACK SURGERY    . KYPHOPLASTY N/A 06/27/2017   Procedure: KYPHOPLASTY -L5;  Surgeon: Hessie Knows, MD;  Location: [facility] ORS;  Service: Orthopedics;  Laterality: N/A;  . [place]   Prior to Admission medications   Medication Sig Start Date End Date Taking? Authorizing Provider  amLODipine (NORVASC) 5 MG tablet Take 5 mg by mouth daily.   Yes [provider]  clopidogrel (PLAVIX) 75 MG tablet Take 75 mg by mouth daily.   Yes [provider]  donepezil (ARICEPT) 5 MG tablet Take 1 tablet (5 mg total) by mouth at bedtime. 08/06/18 09/15/18 Yes Sowles, Drue Stager, MD  empagliflozin (JARDIANCE)  25 MG TABS tablet Take 25 mg by mouth daily.   Yes [provider]  glycopyrrolate (ROBINUL) 1 MG tablet Take 1 mg by mouth 2 (two) times daily.   Yes [provider]  insulin aspart (NOVOLOG FLEXPEN) 100 UNIT/ML FlexPen Inject 12 Units into the skin 2 (two) times daily.   Yes [provider]  insulin aspart (NOVOLOG) 100 UNIT/ML FlexPen Inject 18 Units into the skin daily. At 1700   Yes [provider]  Insulin Degludec-Liraglutide (XULTOPHY) 100-3.6 UNIT-MG/ML SOPN Inject 50 Units into the skin daily.   Yes [provider]  levETIRAcetam (KEPPRA) 500 MG tablet Take 500 mg by mouth 2 (two) times daily.   Yes [provider]  lipase/protease/amylase (CREON) 12000 units CPEP capsule Take 6,000 Units by mouth 3 (three) times daily before meals.   Yes [provider]  lipase/protease/amylase (CREON) 12000 units CPEP capsule Take 3,000 Units by mouth at bedtime. With snack   Yes [provider]  lisinopril (PRINIVIL,ZESTRIL) 5 MG tablet Take 5 mg by mouth daily.   Yes [provider]  metoprolol succinate (TOPROL-XL) 25 MG 24 hr tablet Take 1 tablet (25 mg total) by mouth daily. 08/06/18  Yes Sowles, Drue Stager, MD  rosuvastatin (CRESTOR) 40 MG tablet Take 1 tablet (40 mg total) by mouth daily. 08/06/18 09/15/18 Yes Steele Sizer, MD  aspirin EC 81 MG tablet Take 81 mg by mouth daily.    [provider]  famotidine (PEPCID) 20 MG tablet  Take 1 tablet (20 mg total) by mouth 2 (two) times daily. 08/06/18 09/05/18  Steele Sizer, MD  gabapentin (NEURONTIN) 300 MG capsule Take 1 capsule (300 mg total) by mouth 2 (two) times daily. 08/06/18 09/05/18  Steele Sizer, MD  insulin glargine (LANTUS) 100 UNIT/ML injection Inject 0.1 mLs (10 Units total) into the skin daily. 08/06/18 09/05/18  Steele Sizer, MD  lacosamide 100 MG TABS Take 1 tablet (100 mg total) by mouth 2 (two) times daily. Patient not taking: Reported on  09/15/2018 12/22/17   Fritzi Mandes, MD  promethazine (PHENERGAN) 12.5 MG tablet Take 1 tablet (12.5 mg total) by mouth every 6 (six) hours as needed for nausea or vomiting. Patient not taking: Reported on 09/15/2018 10/10/17   Stark Klein, MD  sertraline (ZOLOFT) 25 MG tablet Take 1 tablet (25 mg total) by mouth daily. Patient not taking: Reported on 09/15/2018 08/06/18   Steele Sizer, MD   Allergies No Known Allergies  Family History Family History  Problem Relation Age of Onset  . Alcohol abuse Father   . Arthritis Mother   . Heart disease Maternal Grandmother   . Prostate cancer Brother    Social History  reports that he has been smoking cigarettes. He has a 30.00 pack-year smoking history. He has never used smokeless tobacco. He reports that he does not drink alcohol or use drugs.  Review Of Systems: Unable to obtain as patient is on continuous BiPAP VITAL SIGNS: BP 120/81   Pulse (!) 113   Temp 97.6 F (36.4 C) (Axillary)   Resp (!) 30   Ht 5\' 10"  (1.778 m)   Wt 95.3 kg   SpO2 96%   BMI 30.13 kg/m   HEMODYNAMICS:    VENTILATOR SETTINGS:    INTAKE / OUTPUT: No intake/output data recorded.  PHYSICAL EXAMINATION: General: Moderate respiratory distress HEENT: On BiPAP, PERRLA, no JVD Neuro: Awakens to voice and touch, follows basic commands Cardiovascular: Apical pulse tachycardic, irregular, S1-S2, no murmur regurg or gallop, +2 pulses bilaterally Lungs: Bilateral breath sounds diminished in all lung fields, increased work of breathing Abdomen: Nondistended, normal bowel sounds Musculoskeletal: No joint deformities Skin: Warm and dry  LABS:  BMET Recent Labs  Lab 02/15/19 1418  NA 139  K 4.1  CL 101  CO2 28  BUN 13  CREATININE 0.86  GLUCOSE 109*    Electrolytes Recent Labs  Lab 02/15/19 1418  CALCIUM 9.0    CBC Recent Labs  Lab 02/15/19 1128 02/15/19 1418  WBC 15.8* 16.2*  HGB 16.6 16.8  HCT 51.9 53.4*  PLT 230 229     Coag's Recent Labs  Lab 02/15/19 1419  INR 1.1    Sepsis Markers Recent Labs  Lab 02/15/19 1453  LATICACIDVEN 1.6    ABG No results for input(s): PHART, PCO2ART, PO2ART in the last 168 hours.  Liver Enzymes Recent Labs  Lab 02/15/19 1418  AST 21  ALT 12  ALKPHOS 81  BILITOT 1.3*  ALBUMIN 3.9    Cardiac Enzymes Recent Labs  Lab 02/15/19 1418  TROPONINI <0.03    Glucose Recent Labs  Lab 02/15/19 2058  GLUCAP 100*    Imaging Dg Chest 2 View  Result Date: 02/15/2019 CLINICAL DATA:  Shortness of breath EXAM: CHEST - 2 VIEW COMPARISON:  12/09/2018 FINDINGS: Mild cardiomegaly. Irregular scarring at the lingula. No acute airspace disease. Aortic atherosclerosis. No pneumothorax. Multiple marked compression fractures of the lower spine. IMPRESSION: No active cardiopulmonary disease. Stable cardiomegaly and scarring at the left  base. Electronically Signed   By: Donavan Foil M.D.   On: 02/15/2019 14:57   Ct Angio Chest Pe W And/or Wo Contrast  Result Date: 02/15/2019 CLINICAL DATA:  Shortness of breath. EXAM: CT ANGIOGRAPHY CHEST WITH CONTRAST TECHNIQUE: Multidetector CT imaging of the chest was performed using the standard protocol during bolus administration of intravenous contrast. Multiplanar CT image reconstructions and MIPs were obtained to evaluate the vascular anatomy. CONTRAST:  80mL ISOVUE-370 IOPAMIDOL (ISOVUE-370) INJECTION 76% COMPARISON:  Chest radiographs obtained earlier today. Chest CT dated 08/22/2018. FINDINGS: Cardiovascular: Single small, band-like filling defect in a proximal left lower lobe pulmonary artery. No other pulmonary arterial filling defects are seen. Atheromatous calcifications, including the coronary arteries and aorta. Mildly enlarged heart. Mediastinum/Nodes: No enlarged mediastinal, hilar, or axillary lymph nodes. Thyroid gland, trachea, and esophagus demonstrate no significant findings. Lungs/Pleura: Mild diffuse peribronchial  thickening. No airspace consolidation. No pleural fluid. Minimal linear scarring at the left lung base. Upper Abdomen: Unremarkable. Musculoskeletal: Multiple old left rib fractures. Stable previously demonstrated severe L1 vertebral compression deformity. Partially included similar deformity of the L2 vertebral body. Interval approximately 40% T10 vertebral compression deformity with interval sclerosis. Interval gas in the adjacent discs. No endplate irregularity or bone destruction. There are two visible fracture lines at the anterior superior aspect of the T10 vertebral body. Minimal bony retropulsion Stable minimal T5, T6 and T9 vertebral compression deformities. Review of the MIP images confirms the above findings. IMPRESSION: 1. Single small, band-like filling defect in a proximal left lower lobe pulmonary artery. 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 No acute pulmonary emboli are seen. 2. Interval approximately 40% T10 vertebral compression deformity with interval sclerosis and gas in the adjacent discs. This is compatible with a subacute, healing fracture. 3. Stable previously demonstrated severe L1 vertebral compression deformity and minimal T5, T6 and T9 vertebral compression deformities with interval partial inclusion of an old severe L2 compression deformity. 4. Mild bronchitic changes. 5.  Calcific coronary artery and aortic atherosclerosis. Aortic Atherosclerosis (ICD10-I70.0). Electronically Signed   By: Claudie Revering M.D.   On: 02/15/2019 16:00     STUDIES:  Last echo 11/19/2017: LV EF: 55% -   60%  CULTURES: Blood cultures x2  ANTIBIOTICS: Tamiflu  SIGNIFICANT EVENTS: 02/15/2019: Admitted  LINES/TUBES: Peripheral IVs  ASSESSMENT  Influenza A infection Acute hypoxic respiratory failure secondary to influenza A infection Chronic atrial fibrillation Diastolic heart failure Chronic atrial fibrillation Chronic thoracic and  lumbar compression fractures   PLAN Continues BiPAP and titrate to nasal cannula as tolerated Tamiflu Follow-up cultures Chest x-ray and ABG as needed Resume all home medications Airborne precautions Nebulized bronchodilators  Best Practice: Code Status: Full code Diet: Heart healthy GI prophylaxis: Not indicated VTE prophylaxis: Already on full-strength anticoagulation  FAMILY  - Updates: No family at bedside.  Will update when available  [place] Rogel S. Tukov-Yual ANP-BC Pulmonary and [place] Pager [phone] or [phone]  NB: This document was prepared using Dragon voice recognition software and may include unintentional dictation errors.    02/15/2019, 9:59 PM

## 2019-02-15 NOTE — ED Notes (Signed)
Pt placed on bipap  

## 2019-02-15 NOTE — ED Notes (Signed)
Patient transported to CT with RT 

## 2019-02-15 NOTE — ED Notes (Signed)
Pt on bipap, per MD can wait on giving tamilfu at this time.

## 2019-02-15 NOTE — ED Triage Notes (Signed)
Pt presents to ED via [place] from [facility]. Facility called EMS for altered mental status, state pt is hyperventilating, anxious, confused, and has been incontinent today which is new for him. Pt presents leaning to the left which both pt and caregiver at bedside state is normal for him.

## 2019-02-15 NOTE — ED Notes (Signed)
Pt diaper and linens changed and cleaned by this RN and Judson Roch, EDT.

## 2019-02-15 NOTE — ED Notes (Signed)
ED TO INPATIENT HANDOFF REPORT  ED Nurse Name and Phone #: Annie Main [phone]  S Name/Age/Gender Ricardo Erickson 78 y.o. male Room/Bed: ED09A/ED09A  Code Status   Code Status: Prior  Home/SNF/Other Home Patient oriented to: self, place, time and situation Is this baseline? Yes   Triage Complete: Triage complete  Chief Complaint ams  Triage Note Pt presents to ED via [place] from [facility]. Facility called EMS for altered mental status, state pt is hyperventilating, anxious, confused, and has been incontinent today which is new for him. Pt presents leaning to the left which both pt and caregiver at bedside state is normal for him.    Allergies No Known Allergies  Level of Care/Admitting Diagnosis ED Disposition    ED Disposition Condition [facility] Area: [place] [[id number]]  Level of Care: Stepdown [14]  Diagnosis: Influenza A [564332]  Admitting Physician: Hyman Bible DODD [[id number]]  Attending Physician: Hyman Bible DODD [[id number]]  Estimated length of stay: past midnight tomorrow  Certification:: I certify this patient will need inpatient services for at least 2 midnights  PT Class (Do Not Modify): Inpatient [101]  PT Acc Code (Do Not Modify): Private [1]       B Medical/Surgery History Past Medical History:  Diagnosis Date  . CHF (congestive heart failure) ([place])   . Chronic back pain   . Dementia ([place])   . Depression   . Emphysema lung ([place])   . Hypertension   . Personal history of tobacco use, presenting hazards to health 08/06/2015  . Pollen allergies   . PTSD (post-traumatic stress disorder)   . Pulmonary embolism [facility])    Past Surgical History:  Procedure Laterality Date  . BACK SURGERY    . KYPHOPLASTY N/A 06/27/2017   Procedure: KYPHOPLASTY -L5;  Surgeon: Hessie Knows, MD;  Location: [facility] ORS;  Service: Orthopedics;  Laterality: N/A;  . TONSILLECTOMY AND ADENOIDECTOMY  1980     A IV  Location/Drains/Wounds Patient Lines/Drains/Airways Status   Active Line/Drains/Airways    Name:   Placement date:   Placement time:   Site:   Days:   Peripheral IV 02/15/19 Right Forearm   02/15/19    1456    Forearm   less than 1   Peripheral IV 02/15/19 Left Forearm   02/15/19    1446    Forearm   less than 1   Incision (Closed) 07/23/17 Back   07/23/17    1900     572          Intake/Output Last 24 hours No intake or output data in the 24 hours ending 02/15/19 1828  Labs/Imaging Results for orders placed or performed during the hospital encounter of 02/15/19 (from the past 48 hour(s))  CBC with Differential/Platelet     Status: Abnormal   Collection Time: 02/15/19 11:28 AM  Result Value Ref Range   WBC 15.8 (H) 4.0 - 10.5 K/uL   RBC 5.66 4.22 - 5.81 MIL/uL   Hemoglobin 16.6 13.0 - 17.0 g/dL   HCT 51.9 39.0 - 52.0 %   MCV 91.7 80.0 - 100.0 fL   MCH 29.3 26.0 - 34.0 pg   MCHC 32.0 30.0 - 36.0 g/dL   RDW 14.9 11.5 - 15.5 %   Platelets 230 150 - 400 K/uL   nRBC 0.0 0.0 - 0.2 %   Neutrophils Relative % 87 %   Neutro Abs 13.7 (H) 1.7 - 7.7 K/uL   Lymphocytes Relative 4 %  Lymphs Abs 0.6 (L) 0.7 - 4.0 K/uL   Monocytes Relative 8 %   Monocytes Absolute 1.3 (H) 0.1 - 1.0 K/uL   Eosinophils Relative 0 %   Eosinophils Absolute 0.1 0.0 - 0.5 K/uL   Basophils Relative 0 %   Basophils Absolute 0.1 0.0 - 0.1 K/uL   WBC Morphology MORPHOLOGY UNREMARKABLE    RBC Morphology MORPHOLOGY UNREMARKABLE    Smear Review PLATELETS APPEAR ADEQUATE    Immature Granulocytes 1 %   Abs Immature Granulocytes 0.13 (H) 0.00 - 0.07 K/uL    Comment: Performed at [facility], [address] [place], [place] [address]  Comprehensive metabolic panel     Status: Abnormal   Collection Time: 02/15/19  2:18 PM  Result Value Ref Range   Sodium 139 135 - 145 mmol/L   Potassium 4.1 3.5 - 5.1 mmol/L   Chloride 101 98 - 111 mmol/L   CO2 28 22 - 32 mmol/L   Glucose, Bld 109 (H) 70 - 99 mg/dL    BUN 13 8 - 23 mg/dL   Creatinine, Ser 0.86 0.61 - 1.24 mg/dL   Calcium 9.0 8.9 - 10.3 mg/dL   Total Protein 6.8 6.5 - 8.1 g/dL   Albumin 3.9 3.5 - 5.0 g/dL   AST 21 15 - 41 U/L   ALT 12 0 - 44 U/L   Alkaline Phosphatase 81 38 - 126 U/L   Total Bilirubin 1.3 (H) 0.3 - 1.2 mg/dL   GFR calc non Af Amer >60 >60 mL/min   GFR calc Af Amer >60 >60 mL/min   Anion gap 10 5 - 15    Comment: Performed at [facility] [address] [place], [place] [address]  CBC     Status: Abnormal   Collection Time: 02/15/19  2:18 PM  Result Value Ref Range   WBC 16.2 (H) 4.0 - 10.5 K/uL   RBC 5.75 4.22 - 5.81 MIL/uL   Hemoglobin 16.8 13.0 - 17.0 g/dL   HCT 53.4 (H) 39.0 - 52.0 %   MCV 92.9 80.0 - 100.0 fL   MCH 29.2 26.0 - 34.0 pg   MCHC 31.5 30.0 - 36.0 g/dL   RDW 14.8 11.5 - 15.5 %   Platelets 229 150 - 400 K/uL   nRBC 0.0 0.0 - 0.2 %    Comment: Performed at [facility], [address] [place], [place] [address]  Troponin I - ONCE - STAT     Status: None   Collection Time: 02/15/19  2:18 PM  Result Value Ref Range   Troponin I <0.03 <0.03 ng/mL    Comment: Performed at [facility], [address] [place], [place] [address]  Protime-INR     Status: None   Collection Time: 02/15/19  2:19 PM  Result Value Ref Range   Prothrombin Time 14.1 11.4 - 15.2 seconds   INR 1.1 0.8 - 1.2    Comment: (NOTE) INR goal varies based on device and disease states. Performed at [facility], [address] [place], [place] [address]   Brain natriuretic peptide     Status: Abnormal   Collection Time: 02/15/19  2:19 PM  Result Value Ref Range   B Natriuretic Peptide 180.0 (H) 0.0 - 100.0 pg/mL    Comment: Performed at [facility], [address] [place], [place] [address]  Lactic acid, plasma     Status: None   Collection Time: 02/15/19  2:53 PM  Result Value Ref Range  Lactic Acid, Venous 1.6 0.5 - 1.9 mmol/L    Comment: Performed at [facility], [address] [place], [place] [address]  Influenza panel by PCR (type A & B)     Status: Abnormal   Collection Time: 02/15/19  3:13 PM  Result Value Ref Range   Influenza A By PCR POSITIVE (A) NEGATIVE   Influenza B By PCR NEGATIVE NEGATIVE    Comment: (NOTE) The Xpert Xpress Flu assay is intended as an aid in the diagnosis of  influenza and should not be used as a sole basis for treatment.  This  assay is FDA approved for nasopharyngeal swab specimens only. Nasal  washings and aspirates are unacceptable for Xpert Xpress Flu testing. Performed at [facility], [address] [place], [place] [address]   Blood gas, venous     Status: Abnormal   Collection Time: 02/15/19  3:14 PM  Result Value Ref Range   pH, Ven 7.38 7.250 - 7.430   pCO2, Ven 56 44.0 - 60.0 mmHg   Bicarbonate 33.1 (H) 20.0 - 28.0 mmol/L   Acid-Base Excess 6.3 (H) 0.0 - 2.0 mmol/L   O2 Saturation 55.8 %   Patient temperature 37.0    Collection site VEIN    Sample type VENIPUNCTURE     Comment: Performed at [facility], [address], [place], [place] [address]   Dg Chest 2 View  Result Date: 02/15/2019 CLINICAL DATA:  Shortness of breath EXAM: CHEST - 2 VIEW COMPARISON:  12/09/2018 FINDINGS: Mild cardiomegaly. Irregular scarring at the lingula. No acute airspace disease. Aortic atherosclerosis. No pneumothorax. Multiple marked compression fractures of the lower spine. IMPRESSION: No active cardiopulmonary disease. Stable cardiomegaly and scarring at the left base. Electronically Signed   By: Donavan Foil M.D.   On: 02/15/2019 14:57   Ct Angio Chest Pe W And/or Wo Contrast  Result Date: 02/15/2019 CLINICAL DATA:  Shortness of breath. EXAM: CT ANGIOGRAPHY CHEST WITH CONTRAST TECHNIQUE: Multidetector CT imaging of the chest was performed using the standard protocol during bolus administration of intravenous contrast. Multiplanar CT image reconstructions and MIPs were obtained to  evaluate the vascular anatomy. CONTRAST:  74mL ISOVUE-370 IOPAMIDOL (ISOVUE-370) INJECTION 76% COMPARISON:  Chest radiographs obtained earlier today. Chest CT dated 08/22/2018. FINDINGS: Cardiovascular: Single small, band-like filling defect in a proximal left lower lobe pulmonary artery. No other pulmonary arterial filling defects are seen. Atheromatous calcifications, including the coronary arteries and aorta. Mildly enlarged heart. Mediastinum/Nodes: No enlarged mediastinal, hilar, or axillary lymph nodes. Thyroid gland, trachea, and esophagus demonstrate no significant findings. Lungs/Pleura: Mild diffuse peribronchial thickening. No airspace consolidation. No pleural fluid. Minimal linear scarring at the left lung base. Upper Abdomen: Unremarkable. Musculoskeletal: Multiple old left rib fractures. Stable previously demonstrated severe L1 vertebral compression deformity. Partially included similar deformity of the L2 vertebral body. Interval approximately 40% T10 vertebral compression deformity with interval sclerosis. Interval gas in the adjacent discs. No endplate irregularity or bone destruction. There are two visible fracture lines at the anterior superior aspect of the T10 vertebral body. Minimal bony retropulsion Stable minimal T5, T6 and T9 vertebral compression deformities. Review of the MIP images confirms the above findings. IMPRESSION: 1. Single small, band-like filling defect in a proximal left lower lobe pulmonary artery. 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 No acute pulmonary emboli are seen. 2. Interval approximately 40% T10 vertebral compression deformity with interval sclerosis and gas in the adjacent discs. This is  compatible with a subacute, healing fracture. 3. Stable previously demonstrated severe L1 vertebral compression deformity and minimal T5, T6 and T9 vertebral compression deformities with interval partial inclusion of an old  severe L2 compression deformity. 4. Mild bronchitic changes. 5.  Calcific coronary artery and aortic atherosclerosis. Aortic Atherosclerosis (ICD10-I70.0). Electronically Signed   By: Claudie Revering M.D.   On: 02/15/2019 16:00    Pending Labs Unresulted Labs (From admission, onward)    Start     Ordered   02/15/19 1446  Differential  Add-on,   AD     02/15/19 1445   02/15/19 1445  Culture, blood (Routine x 2)  BLOOD CULTURE X 2,   STAT     02/15/19 1444   02/15/19 1445  Urinalysis, Complete w Microscopic  ONCE - STAT,   STAT     02/15/19 1444   Signed and Held  Basic metabolic panel  Tomorrow morning,   R     Signed and Held   Signed and Held  CBC  Tomorrow morning,   R     Signed and Held          Vitals/Pain Today's Vitals   02/15/19 1700 02/15/19 1730 02/15/19 1800 02/15/19 1815  BP: 122/78 111/70 105/72   Pulse: 92 96 90 (!) 112  Resp:  (!) 23 (!) 28 (!) 26  Temp:      TempSrc:      SpO2: 99% 97% 96% 96%  Weight:      Height:        Isolation Precautions Droplet precaution  Medications Medications  oseltamivir (TAMIFLU) capsule 75 mg (has no administration in time range)  furosemide (LASIX) injection 40 mg (40 mg Intravenous Given 02/15/19 1559)  iopamidol (ISOVUE-370) 76 % injection 75 mL (75 mLs Intravenous Contrast Given 02/15/19 1525)  LORazepam (ATIVAN) injection 0.5 mg (0.5 mg Intravenous Given 02/15/19 1554)  diltiazem (CARDIZEM) injection 10 mg (10 mg Intravenous Given 02/15/19 1641)    Mobility walks High fall risk   Focused Assessments Cardiac Assessment Handoff:  Cardiac Rhythm: Atrial fibrillation Lab Results  Component Value Date   TROPONINI <0.03 02/15/2019   No results found for: DDIMER Does the Patient currently have chest pain? No     R Recommendations: See Admitting Provider Note  Report given to:   Additional Notes: Lives at [facility] community and has caregivers check on him.  Son came to bedside and is available.

## 2019-02-16 LAB — CBC
HCT: 48 % (ref 39.0–52.0)
Hemoglobin: 15 g/dL (ref 13.0–17.0)
MCH: 29.4 pg (ref 26.0–34.0)
MCHC: 31.3 g/dL (ref 30.0–36.0)
MCV: 93.9 fL (ref 80.0–100.0)
Platelets: 211 10*3/uL (ref 150–400)
RBC: 5.11 MIL/uL (ref 4.22–5.81)
RDW: 14.7 % (ref 11.5–15.5)
WBC: 13.5 10*3/uL — ABNORMAL HIGH (ref 4.0–10.5)
nRBC: 0 % (ref 0.0–0.2)

## 2019-02-16 LAB — BASIC METABOLIC PANEL
ANION GAP: 11 (ref 5–15)
BUN: 17 mg/dL (ref 8–23)
CALCIUM: 8.4 mg/dL — AB (ref 8.9–10.3)
CO2: 30 mmol/L (ref 22–32)
Chloride: 97 mmol/L — ABNORMAL LOW (ref 98–111)
Creatinine, Ser: 0.95 mg/dL (ref 0.61–1.24)
GFR calc Af Amer: >60 mL/min (ref >60)
GFR calc non Af Amer: >60 mL/min (ref >60)
Glucose, Bld: 105 mg/dL — ABNORMAL HIGH (ref 70–99)
Potassium: 3.7 mmol/L (ref 3.5–5.1)
Sodium: 138 mmol/L (ref 135–145)

## 2019-02-16 LAB — MAGNESIUM: Magnesium: 2.1 mg/dL (ref 1.7–2.4)

## 2019-02-16 LAB — PHOSPHORUS: Phosphorus: 3.4 mg/dL (ref 2.5–4.6)

## 2019-02-16 MED ORDER — LACTATED RINGERS IV BOLUS
1000.0000 mL | Freq: Once | INTRAVENOUS | Status: DC
  Administered 2019-02-16: 1000 mL via INTRAVENOUS

## 2019-02-16 MED ORDER — LACTATED RINGERS IV BOLUS: 1000.0000 mL | Freq: Once | INTRAVENOUS | Status: DC

## 2019-02-16 MED ORDER — METOPROLOL TARTRATE 25 MG PO TABS
12.5000 mg | ORAL_TABLET | Freq: Two times a day (BID) | ORAL | Status: DC
  Administered 2019-02-16 (×2): 12.5 mg via ORAL
  Filled 2019-02-16 (×3): qty 1

## 2019-02-16 MED ORDER — HYDRALAZINE HCL 20 MG/ML IJ SOLN
10.0000 mg | INTRAMUSCULAR | Status: DC | PRN
  Filled 2019-02-16: qty 1

## 2019-02-16 NOTE — Progress Notes (Signed)
Patient has ben alert and mostly orientated all day. Bipap removed early in am and placed on 6 L 02 via cannula, then weaned down to none for most of morning. Pt remains in A fib, rate 90-140 , depending on anxiety level. Hover Toprol held at 10 am due to BP in 90s. MS aware. Then this afternoon HR continued to climb and so lsmall lopressor ordered, but BP labile, 80s then rechecked 107, but when RN went to give med pt was asleep and BP in 70s. Lopressor held, MD notified and fluid bolus ordered and in progress at this time. Pt placed on  02 as sats dropped while asleep , currently on 1 L with sats 93 %. Son visited for short time today, but states he thinks he now has the flu and so went back home

## 2019-02-16 NOTE — Progress Notes (Signed)
BP improved after fluid bolus given and intimally HR dropped into 90-110 A fib, however it soon escalated to 120-135 and was more sustained so low dose loprssor given.   Pt had condom cath alst night, but as was more awake and alert today we removed it and tried urinal, since this is his normal behavior. However the bed has been changed twice and he was not able to use urinal so condom cath reapplied

## 2019-02-16 NOTE — Plan of Care (Signed)
Pt has dementia and son is ill at home with flu as well, limited progress today

## 2019-02-16 NOTE — Progress Notes (Signed)
PRN hydralazine held.  Pt is resting.  Pt is anxious when awake falsely elevating blood pressure.  NP notified

## 2019-02-17 LAB — CBC
HCT: 47.1 % (ref 39.0–52.0)
Hemoglobin: 15.1 g/dL (ref 13.0–17.0)
MCH: 29.5 pg (ref 26.0–34.0)
MCHC: 32.1 g/dL (ref 30.0–36.0)
MCV: 92 fL (ref 80.0–100.0)
Platelets: 164 10*3/uL (ref 150–400)
RBC: 5.12 MIL/uL (ref 4.22–5.81)
RDW: 14.9 % (ref 11.5–15.5)
WBC: 10.8 10*3/uL — ABNORMAL HIGH (ref 4.0–10.5)
nRBC: 0 % (ref 0.0–0.2)

## 2019-02-17 MED ORDER — METOPROLOL TARTRATE 25 MG PO TABS
25.0000 mg | ORAL_TABLET | Freq: Two times a day (BID) | ORAL | Status: DC
  Administered 2019-02-17 (×2): 25 mg via ORAL
  Filled 2019-02-17: qty 1

## 2019-02-17 MED ORDER — METOPROLOL TARTRATE 5 MG/5ML IV SOLN
2.5000 mg | Freq: Once | INTRAVENOUS | Status: AC
  Administered 2019-02-17: 2.5 mg via INTRAVENOUS
  Filled 2019-02-17: qty 5

## 2019-02-17 MED ORDER — DIGOXIN 125 MCG PO TABS
0.1250 mg | ORAL_TABLET | Freq: Once | ORAL | Status: AC
  Administered 2019-02-17: 0.125 mg via ORAL
  Filled 2019-02-17: qty 1

## 2019-02-17 NOTE — Progress Notes (Signed)
Patient ID: Ricardo Erickson, male   DOB: [birth year], 78 y.o.   MRN: [id number]  S) Patient voices no complaints.  He continues to have some issues with atrial fibrillation.  Beta-blocker dose being adjusted.  As blood pressure allows.  He is pleasantly befuddled.  Not symptomatic with A. fib.  0) vital signs noted.  Monitor atrial fib.  With rate between 100-110 General: No respiratory distress HEENT: PERRLA, no JVD Neuro: Awakens to voice and touch, follows basic commands Cardiovascular: Apical pulse tachycardic, irregular, S1-S2, no murmur regurg or gallop, +2 pulses bilaterally Lungs: Bilateral breath sounds diminished in all lung fields, increased work of breathing Abdomen: Nondistended, normal bowel sounds Musculoskeletal:  Severe kyphoscoliosis Skin: Warm and dry   ASSESSMENT  Influenza A infection Acute hypoxic respiratory failure secondary to influenza A infection Chronic atrial fibrillation on full anticoagulation Diastolic heart failure Chronic atrial fibrillation Chronic thoracic and lumbar compression fractures with significant kyphoscoliosis   PLAN On nasal cannula, tolerating Tamiflu Increase beta-blocker dose as tolerated to his baseline dosage. Follow-up cultures Chest x-ray and ABG as needed Resume all home medications Droplet precautions Nebulized bronchodilators   C. Derrill Kay, MD [facility] [facility]

## 2019-02-17 NOTE — Progress Notes (Signed)
Pastoral Care Visit   02/17/19 1415  Clinical Encounter Type  Visited With Patient  Visit Type Initial;Social support;Psychological support;Spiritual support;Critical Care  Referral From Nurse  Consult/Referral To Chaplain  Recommendations f/u as availalble  Spiritual Encounters  Spiritual Needs Prayer  Stress Factors  Patient Stress Factors Other (Comment) (lonely)   Chap paged to provide a listening ear to pt who is lonely. Pt shared about his wife, children, grandchildren, career, and hobbies.  Pt lives at [facility] and has great support there. Pt is anxious to get well so he can go home.  Pt would benefit from addtl chap visits for companionship.

## 2019-02-17 NOTE — Progress Notes (Signed)
Pt awake and alert and wants to get up. Son yesterday stated pt ambulated well with walker. 2 RNs helped pt to chair and he ambulated with difficulty, HR increased to 140-150 and was sustained. MD notified and lopressor dose increased. Remains in a fib slowly improving, into 120-130 at this time

## 2019-02-18 DIAGNOSIS — I4891 Unspecified atrial fibrillation: Secondary | ICD-10-CM

## 2019-02-18 DIAGNOSIS — J441 Chronic obstructive pulmonary disease with (acute) exacerbation: Secondary | ICD-10-CM

## 2019-02-18 LAB — COMPREHENSIVE METABOLIC PANEL
ALT: 8 U/L (ref 0–44)
AST: 22 U/L (ref 15–41)
Albumin: 2.9 g/dL — ABNORMAL LOW (ref 3.5–5.0)
Alkaline Phosphatase: 55 U/L (ref 38–126)
Anion gap: 6 (ref 5–15)
BUN: 14 mg/dL (ref 8–23)
CO2: 27 mmol/L (ref 22–32)
Calcium: 7.8 mg/dL — ABNORMAL LOW (ref 8.9–10.3)
Chloride: 102 mmol/L (ref 98–111)
Creatinine, Ser: 0.63 mg/dL (ref 0.61–1.24)
GFR calc non Af Amer: >60 mL/min (ref >60)
Glucose, Bld: 120 mg/dL — ABNORMAL HIGH (ref 70–99)
Potassium: 3.9 mmol/L (ref 3.5–5.1)
Sodium: 135 mmol/L (ref 135–145)
TOTAL PROTEIN: 5.5 g/dL — AB (ref 6.5–8.1)
Total Bilirubin: 1.2 mg/dL (ref 0.3–1.2)

## 2019-02-18 LAB — MAGNESIUM: Magnesium: 2 mg/dL (ref 1.7–2.4)

## 2019-02-18 LAB — CBC
HCT: 47.3 % (ref 39.0–52.0)
Hemoglobin: 15.2 g/dL (ref 13.0–17.0)
MCH: 29.2 pg (ref 26.0–34.0)
MCHC: 32.1 g/dL (ref 30.0–36.0)
MCV: 90.8 fL (ref 80.0–100.0)
NRBC: 0.2 % (ref 0.0–0.2)
Platelets: 171 10*3/uL (ref 150–400)
RBC: 5.21 MIL/uL (ref 4.22–5.81)
RDW: 14.7 % (ref 11.5–15.5)
WBC: 9.9 10*3/uL (ref 4.0–10.5)

## 2019-02-18 LAB — PHOSPHORUS: Phosphorus: 3.2 mg/dL (ref 2.5–4.6)

## 2019-02-18 MED ORDER — AMIODARONE IV BOLUS ONLY 150 MG/100ML
150.0000 mg | Freq: Once | INTRAVENOUS | Status: AC
  Administered 2019-02-18: 150 mg via INTRAVENOUS
  Filled 2019-02-18: qty 100

## 2019-02-18 MED ORDER — METOPROLOL TARTRATE 25 MG PO TABS
25.0000 mg | ORAL_TABLET | Freq: Three times a day (TID) | ORAL | Status: DC
  Administered 2019-02-18 – 2019-02-19 (×3): 25 mg via ORAL
  Filled 2019-02-18 (×3): qty 1

## 2019-02-18 MED ORDER — BUDESONIDE 0.25 MG/2ML IN SUSP
0.2500 mg | Freq: Two times a day (BID) | RESPIRATORY_TRACT | Status: DC
  Administered 2019-02-18 – 2019-02-24 (×12): 0.25 mg via RESPIRATORY_TRACT
  Filled 2019-02-18 (×12): qty 2

## 2019-02-18 NOTE — Progress Notes (Signed)
[place] at [place] NAME: Ricardo Erickson    MR#:  [id number]  DATE OF BIRTH:  1941/01/23  SUBJECTIVE:  CHIEF COMPLAINT:   Chief Complaint  Patient presents with  . Altered Mental Status   Confused On bipap  REVIEW OF SYSTEMS:    Review of Systems  Unable to perform ROS: Dementia    DRUG ALLERGIES:  No Known Allergies  VITALS:  Blood pressure (!) 130/102, pulse (!) 107, temperature (!) 97.2 F (36.2 C), temperature source Axillary, resp. rate (!) 23, height 5\' 10"  (1.778 m), weight 95.3 kg, SpO2 95 %.  PHYSICAL EXAMINATION:   Physical Exam  GENERAL:  78 y.o.-year-old patient lying in the bed with no acute distress.  EYES: Pupils equal, round, reactive to light and accommodation. No scleral icterus. Extraocular muscles intact.  HEENT: Head atraumatic, normocephalic. Oropharynx and nasopharynx clear.  NECK:  Supple, no jugular venous distention. No thyroid enlargement, no tenderness.  LUNGS: Decreased breath sounds bilaterally CARDIOVASCULAR: S1, S2 normal. No murmurs, rubs, or gallops.  ABDOMEN: Soft, nontender, nondistended. Bowel sounds present. No organomegaly or mass.  EXTREMITIES: No cyanosis, clubbing or edema b/l.    NEUROLOGIC: Cranial nerves II through XII are intact. No focal Motor or sensory deficits b/l.   PSYCHIATRIC: The patient is alert and awake SKIN: No obvious rash, lesion, or ulcer.   LABORATORY PANEL:   CBC Recent Labs  Lab 02/18/19 0513  WBC 9.9  HGB 15.2  HCT 47.3  PLT 171   ------------------------------------------------------------------------------------------------------------------ Chemistries  Recent Labs  Lab 02/18/19 0513  NA 135  K 3.9  CL 102  CO2 27  GLUCOSE 120*  BUN 14  CREATININE 0.63  CALCIUM 7.8*  MG 2.0  AST 22  ALT 8  ALKPHOS 55  BILITOT 1.2    ------------------------------------------------------------------------------------------------------------------  Cardiac Enzymes Recent Labs  Lab 02/15/19 1418  TROPONINI <0.03   ------------------------------------------------------------------------------------------------------------------  RADIOLOGY:  No results found.   ASSESSMENT AND PLAN:   * Acute hypoxic respiratory failure secondary to influenza A Continue BiPAP . Need ongoing stepdown care Continue Tamiflu.  Duonebs as needed.  Supportive care.  * Chronic atrial fibrillation-in RVR on arrival to the ED.  cardizem.  Continue home metoprolol.  Cardiac monitoring.  * Multiple thoracic/lumbar compression fractures-seen on CTA chest.  Tylenol and tramadol as needed for pain.  PT consult when appropriate.  * Hypertension- normotensive in the ED.  Continue home metoprolol.  * COPD- no active wheezing.  Continue home inhalers.  Duonebs PRN.  * Diastolic congestive heart failure-stable.  No signs of volume overload.  Continue home Lasix 20 mg p.o. daily.  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    Continue Eliquis   Anxiety- continue Prozac  Dementia-continue Zyprexa and Namenda Monitor for inpatient delirium  BPH-continue Proscar and and Flomax  All the records are reviewed and case discussed with Care Management/Social Worker Management plans discussed with the patient, family and they are in agreement.  CODE STATUS: FULL CODE  DVT Prophylaxis: SCDs  TOTAL TIME TAKING CARE OF THIS PATIENT: 30 minutes.   POSSIBLE D/C IN 2-3 DAYS, DEPENDING ON CLINICAL CONDITION.  Leia Alf Kalina Morabito M.D on 02/18/2019 at 12:36 PM  Between 7am to 6pm - Pager - [phone]  After 6pm go to [email or web address] - password [email or web address] [facility]  Office  [phone]  CC: Primary care physician; Rusty Aus, MD  Note: This dictation was prepared with Dragon dictation along with smaller phrase  technology.  Any transcriptional errors that result from this process are unintentional.

## 2019-02-18 NOTE — Progress Notes (Signed)
[place] at [place] NAME: Ulice Follett    MR#:  [id number]  DATE OF BIRTH:  1941-01-26  SUBJECTIVE:  CHIEF COMPLAINT:   Chief Complaint  Patient presents with  . Altered Mental Status   Confused  REVIEW OF SYSTEMS:    Review of Systems  Unable to perform ROS: Dementia    DRUG ALLERGIES:  No Known Allergies  VITALS:  Blood pressure (!) 130/102, pulse (!) 107, temperature (!) 97.2 F (36.2 C), temperature source Axillary, resp. rate (!) 23, height 5\' 10"  (1.778 m), weight 95.3 kg, SpO2 95 %.  PHYSICAL EXAMINATION:   Physical Exam  GENERAL:  78 y.o.-year-old patient lying in the bed with no acute distress.  EYES: Pupils equal, round, reactive to light and accommodation. No scleral icterus. Extraocular muscles intact.  HEENT: Head atraumatic, normocephalic. Oropharynx and nasopharynx clear.  NECK:  Supple, no jugular venous distention. No thyroid enlargement, no tenderness.  LUNGS: Decreased breath sounds bilaterally CARDIOVASCULAR: S1, S2 normal. No murmurs, rubs, or gallops.  ABDOMEN: Soft, nontender, nondistended. Bowel sounds present. No organomegaly or mass.  EXTREMITIES: No cyanosis, clubbing or edema b/l.    NEUROLOGIC: Cranial nerves II through XII are intact. No focal Motor or sensory deficits b/l.   PSYCHIATRIC: The patient is alert and awake SKIN: No obvious rash, lesion, or ulcer.   LABORATORY PANEL:   CBC Recent Labs  Lab 02/18/19 0513  WBC 9.9  HGB 15.2  HCT 47.3  PLT 171   ------------------------------------------------------------------------------------------------------------------ Chemistries  Recent Labs  Lab 02/18/19 0513  NA 135  K 3.9  CL 102  CO2 27  GLUCOSE 120*  BUN 14  CREATININE 0.63  CALCIUM 7.8*  MG 2.0  AST 22  ALT 8  ALKPHOS 55  BILITOT 1.2   ------------------------------------------------------------------------------------------------------------------  Cardiac  Enzymes Recent Labs  Lab 02/15/19 1418  TROPONINI <0.03   ------------------------------------------------------------------------------------------------------------------  RADIOLOGY:  No results found.   ASSESSMENT AND PLAN:   * Acute hypoxic respiratory failure secondary to influenza A on [place] Continue Tamiflu.  Duonebs as needed. Transfer to floor  * Chronic atrial fibrillation  Continue home metoprolol.  Cardiac monitoring.  * Multiple thoracic/lumbar compression fractures-seen on CTA chest.  Tylenol and tramadol as needed for pain.  PT consult when appropriate.  * Hypertension- normotensive in the ED.  Continue home metoprolol.  * COPD- no active wheezing.  Continue home inhalers.  Duonebs PRN.  * Diastolic congestive heart failure-stable.  No signs of volume overload.    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    Continue Eliquis   Anxiety- continue Prozac  Dementia-continue Zyprexa and Namenda Monitor for inpatient delirium  BPH-continue Proscar and and Flomax  All the records are reviewed and case discussed with Care Management/Social Worker Management plans discussed with the patient, family and they are in agreement.  CODE STATUS: FULL CODE  DVT Prophylaxis: SCDs  TOTAL TIME TAKING CARE OF THIS PATIENT: 30 minutes.   POSSIBLE D/C IN 2-3 DAYS, DEPENDING ON CLINICAL CONDITION.  Leia Alf Rahmel Nedved M.D on 02/18/2019 at 12:45 PM  Between 7am to 6pm - Pager - [phone]  After 6pm go to [email or web address] - password [email or web address] [facility]  Office  [phone]  CC: Primary care physician; Rusty Aus, MD  Note: This dictation was prepared with Dragon dictation along with smaller phrase technology. Any transcriptional errors that result from this process are unintentional.

## 2019-02-18 NOTE — Progress Notes (Signed)
Pt HR went up to 150's nonsustained and came back down to 108. Pt asymptomatic and MD notified. No new orders given. Will continue to monitor.

## 2019-02-18 NOTE — Progress Notes (Addendum)
[place] at [place] NAME: Ricardo Erickson    MR#:  [id number]  DATE OF BIRTH:  1941/01/14  SUBJECTIVE:  CHIEF COMPLAINT:   Chief Complaint  Patient presents with  . Altered Mental Status   Confused Off bipap and now on [place]  REVIEW OF SYSTEMS:    Review of Systems  Unable to perform ROS: Dementia    DRUG ALLERGIES:  No Known Allergies  VITALS:  Blood pressure (!) 130/102, pulse (!) 107, temperature (!) 97.2 F (36.2 C), temperature source Axillary, resp. rate (!) 23, height 5\' 10"  (1.778 m), weight 95.3 kg, SpO2 95 %.  PHYSICAL EXAMINATION:   Physical Exam  GENERAL:  78 y.o.-year-old patient lying in the bed with no acute distress.  EYES: Pupils equal, round, reactive to light and accommodation. No scleral icterus. Extraocular muscles intact.  HEENT: Head atraumatic, normocephalic. Oropharynx and nasopharynx clear.  NECK:  Supple, no jugular venous distention. No thyroid enlargement, no tenderness.  LUNGS: Decreased breath sounds bilaterally CARDIOVASCULAR: S1, S2 normal. No murmurs, rubs, or gallops.  ABDOMEN: Soft, nontender, nondistended. Bowel sounds present. No organomegaly or mass.  EXTREMITIES: No cyanosis, clubbing or edema b/l.    NEUROLOGIC: Cranial nerves II through XII are intact. No focal Motor or sensory deficits b/l.   PSYCHIATRIC: The patient is alert and awake SKIN: No obvious rash, lesion, or ulcer.   LABORATORY PANEL:   CBC Recent Labs  Lab 02/18/19 0513  WBC 9.9  HGB 15.2  HCT 47.3  PLT 171   ------------------------------------------------------------------------------------------------------------------ Chemistries  Recent Labs  Lab 02/18/19 0513  NA 135  K 3.9  CL 102  CO2 27  GLUCOSE 120*  BUN 14  CREATININE 0.63  CALCIUM 7.8*  MG 2.0  AST 22  ALT 8  ALKPHOS 55  BILITOT 1.2    ------------------------------------------------------------------------------------------------------------------  Cardiac Enzymes Recent Labs  Lab 02/15/19 1418  TROPONINI <0.03   ------------------------------------------------------------------------------------------------------------------  RADIOLOGY:  No results found.   ASSESSMENT AND PLAN:   * Acute hypoxic respiratory failure secondary to influenza A Off bipap today and on [place] Continue Tamiflu.  Duonebs as needed.  Supportive care.  * Chronic atrial fibrillation  Continue home metoprolol.  Cardiac monitoring.  * Multiple thoracic/lumbar compression fractures-seen on CTA chest.  Tylenol and tramadol as needed for pain.  PT consult when appropriate.  * Hypertension- normotensive in the ED.  Continue home metoprolol.  * COPD- no active wheezing.  Continue home inhalers.  Duonebs PRN.  * Diastolic congestive heart failure-stable.  No signs of volume overload.  Continue home Lasix 20 mg p.o. daily.  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    Continue Eliquis   Anxiety- continue Prozac  Dementia-continue Zyprexa and Namenda Monitor for inpatient delirium  BPH-continue Proscar and and Flomax  All the records are reviewed and case discussed with Care Management/Social Worker Management plans discussed with the patient, family and they are in agreement.  CODE STATUS: FULL CODE  DVT Prophylaxis: SCDs  TOTAL TIME TAKING CARE OF THIS PATIENT: 30 minutes.   POSSIBLE D/C IN 2-3 DAYS, DEPENDING ON CLINICAL CONDITION.  Leia Alf Khali Albanese M.D on 02/18/2019 at 12:39 PM  Between 7am to 6pm - Pager - [phone]  After 6pm go to [email or web address] - password [email or web address] [facility]  Office  [phone]  CC: Primary care physician; Rusty Aus, MD  Note: This dictation was prepared with Dragon dictation along with smaller phrase technology. Any transcriptional errors that  result from this process  are unintentional.

## 2019-02-18 NOTE — Progress Notes (Signed)
Report called to Blossom Hoops RN for [facility] ICU RN, since patient's chart has been transferred to 2A. NT to travel with patient to room 251. Patient on Droplet precautions and on cardiac monitor.

## 2019-02-18 NOTE — Progress Notes (Signed)
Patient ID: ANTIONO ETTINGER, male   DOB: [birth year], 78 y.o.   MRN: [id number]  S) no new complaints.  A. fib rate controlled.  0)   Vitals:   02/18/19 0900 02/18/19 1000 02/18/19 1100 02/18/19 1200  BP: 118/88 124/81 (!) 114/95 (!) 130/102  Pulse: 94 95 (!) 105 (!) 107  Resp: (!) 22 (!) 22 (!) 24 (!) 23  Temp:      TempSrc:      SpO2:      Weight:      Height:        General: NAD HEENT:  NCAT, sclerae white Neuro:  No focal deficits Cardiovascular:  IRIR, no M noted Lungs:  Unlabored, few scattered wheezes Abdomen:  NT, NABS Extremities: Warm, no edema, chronic stasis changes Skin:  No acute findings   ASSESSMENT Acute hypoxic respiratory failure secondary to influenza A infection COPD exacerbation Chronic atrial fibrillation on full anticoagulation  Rate control improved Chronic thoracic and lumbar compression fractures with significant kyphoscoliosis Anxiety/agitation   PLAN Nebulized steroids and bronchodilators Continue metoprolol Complete 5-day course of oseltamivir Continue lorazepam as needed Transfer to MedSurg floor After transfer, PCCM will sign off. Please call if we can be of further assistance     Merton Border, MD PCCM service Mobile [phone] Pager [phone] 02/18/2019 4:13 PM

## 2019-02-18 NOTE — Care Management (Signed)
This RNCM to bedside; patient sleeping.   No family at bedside.  Spoke with Jonelle Sidle, RN.  She states patient is very weak and confused.  Says son should be here this afternoon.  Left my number with Tiffany to call me if son arrives.  RN consult for skilled nursing at [place].

## 2019-02-19 MED ORDER — METOPROLOL TARTRATE 50 MG PO TABS
100.0000 mg | ORAL_TABLET | Freq: Two times a day (BID) | ORAL | Status: DC
  Administered 2019-02-19 – 2019-02-22 (×6): 100 mg via ORAL
  Filled 2019-02-19 (×7): qty 2

## 2019-02-19 MED ORDER — LORAZEPAM 2 MG/ML IJ SOLN
0.5000 mg | Freq: Four times a day (QID) | INTRAMUSCULAR | Status: DC | PRN
  Administered 2019-02-19 – 2019-02-21 (×2): 0.5 mg via INTRAVENOUS
  Filled 2019-02-19 (×2): qty 1

## 2019-02-19 MED ORDER — METOPROLOL SUCCINATE ER 100 MG PO TB24
100.0000 mg | ORAL_TABLET | Freq: Every day | ORAL | Status: DC
  Administered 2019-02-19: 100 mg via ORAL
  Filled 2019-02-19: qty 1

## 2019-02-19 NOTE — TOC Initial Note (Signed)
Transition of Care [facility]) - Initial/Assessment Note    Patient Details  Name: Ricardo Erickson MRN: [id number] Date of Birth: 06-09-1941  Transition of Care [facility]) CM/SW Contact:    Elza Rafter, RN Phone Number: 02/19/2019, 3:28 PM  Clinical Narrative:      Patient has been admitted with Flu A.  From [place].  CM consult for return to [facility] with home health services per son, [name]'s request.  Current with PCP.  Uses a walker at home.  He is very agitated here; son states he typically has hospital anxiety and will do best going back to [facility] at the time of discharge.  Referral to [facility] and accepted.  Will notify Corene Cornea at discharge.  No further needs identified.  Son, [name] will transport to home at discharge.               Expected Discharge Plan: Assisted Living(Home health) Barriers to Discharge: Continued Medical Work up   Patient Goals and CMS Choice Patient states their goals for this hospitalization and ongoing recovery are:: (Spoke with son [name] as patient is confused.  DC to [facility] with Home health services) CMS Medicare.gov Compare Post Acute Care list provided to:: Other (Comment Required)(Son, [name]) Choice offered to / list presented to : Adult Children  Expected Discharge Plan and Services Expected Discharge Plan: Assisted Living(Home health) Discharge Planning Services: CM Consult Post Acute Care Choice: Home Health Living arrangements for the past 2 months: [place]: [facility] ([facility])  Prior Living Arrangements/Services Living arrangements for the past 2 months: Apartment Lives with:: Self Patient language and need for interpreter reviewed:: Yes          Care giver support system in place?: Yes (comment) Current home services: Homehealth aide, Home OT, Home PT, Home RN Criminal Activity/Legal Involvement Pertinent to Current Situation/Hospitalization: No - Comment as  needed  Activities of Daily Living      Permission Sought/Granted                  Emotional Assessment Appearance:: Appears stated age Attitude/Demeanor/Rapport: Other (comment)(Confused) Affect (typically observed): Restless, Agitated Orientation: : (Not oriented X 4) Alcohol / Substance Use: Not Applicable    Admission diagnosis:  Acute respiratory distress [R06.03] Influenza A [J10.1] Patient Active Problem List   Diagnosis Date Noted  . Influenza A 02/15/2019  . Pneumonia 11/27/2018  . Pulmonary emboli ([place]) 11/18/2017  . Tobacco use disorder 07/24/2017  . Encephalopathy   . Palliative care encounter   . Goals of care, counseling/discussion   . Pulmonary embolus ([place])   . UTI (urinary tract infection) 07/19/2017  . PTSD (post-traumatic stress disorder) 06/28/2017  . Adjustment disorder with anxiety 06/28/2017  . Closed compression fracture of L5 lumbar vertebra   . Left low back pain   . Weakness   . A-fib ([place]) 06/24/2017  . Prediabetes 01/18/2017  . Prostate cancer ([place]) 01/18/2017  . Anxiety 01/18/2017  . Preventative health care 04/15/2016  . Restless leg syndrome 04/03/2016  . COPD (chronic obstructive pulmonary disease) ([place]) 04/03/2016  . BPH (benign prostatic hyperplasia) 04/03/2016  . Personal history of tobacco use, presenting hazards to health 08/06/2015   PCP:  Rusty Aus, MD Pharmacy:   [facility] [place], [facility] [address] [place] [address] Phone: [phone] Fax: [phone]  [id number] - [place], [place] - [address] [place] [address] [place] [address] Phone: [phone] Fax: [phone]     Social Determinants of Health (SDOH) Interventions    Readmission Risk Interventions 30 Day Unplanned Readmission Risk Score     ED to Hosp-Admission (Current) from 02/15/2019 in [facility] (2A)  30 Day Unplanned Readmission Risk Score (%)  27 Filed at 02/19/2019 1200     This score is the patient's risk of an unplanned readmission within 30 days of being discharged (0 -100%). The score is based on dignosis, age, lab data, medications, orders, and past utilization.   Low:  0-14.9   Medium: 15-21.9   High: 22-29.9   Extreme: 30 and above       No flowsheet data found.

## 2019-02-19 NOTE — Progress Notes (Signed)
Advance care planning  Purpose of Encounter Influenza, Dementia  Parties in Attendance Patient and Son over the phone-Ricardo Erickson  Patients Decisional capacity Patient with dementia and confusion. Unable to make medical decisions Son Ricardo Erickson is documented HCPOA  We discussed regarding patient's Influenza and worsening confusion. Patient not improving as expected although has improved some. Son tells me he has similar confusion episodes every time he is in the hospital. I did advise that his dementia and poor PO intake are limiting his recovery and complication rate is high. Son understands and tells me patient has declined over last 2 yr. He is hopeful confusion will clear once out of the hospital.  Code status discussed and son wants patient to be DNR/DNI  Orders entered and code status changed to DNR/DNI  Time spent - 20 minutes

## 2019-02-19 NOTE — Progress Notes (Addendum)
[place] at [place] NAME: Ricardo Erickson    MR#:  [id number]  DATE OF BIRTH:  1940-12-26  SUBJECTIVE:  CHIEF COMPLAINT:   Chief Complaint  Patient presents with  . Altered Mental Status   Confused. [place] on Unable to provide any history  REVIEW OF SYSTEMS:    Review of Systems  Unable to perform ROS: Dementia    DRUG ALLERGIES:  No Known Allergies  VITALS:  Blood pressure (!) 179/156, pulse (!) 104, temperature 98.7 F (37.1 C), temperature source Oral, resp. rate 19, height 5\' 10"  (1.778 m), weight 95.3 kg, SpO2 93 %.  PHYSICAL EXAMINATION:   Physical Exam  GENERAL:  78 y.o.-year-old patient lying in the bed , drowzy EYES: Pupils equal, round, reactive to light and accommodation. No scleral icterus. Extraocular muscles intact.  HEENT: Head atraumatic, normocephalic. Oropharynx and nasopharynx clear.  NECK:  Supple, no jugular venous distention. No thyroid enlargement, no tenderness.  LUNGS: Decreased breath sounds bilaterally CARDIOVASCULAR: S1, S2 normal. No murmurs, rubs, or gallops.  ABDOMEN: Soft, nontender, nondistended. Bowel sounds present. No organomegaly or mass.  EXTREMITIES: No cyanosis, clubbing or edema b/l.    NEUROLOGIC: Cranial nerves II through XII are intact. No focal Motor or sensory deficits b/l.   PSYCHIATRIC: The patient is drowzy. confused SKIN: No obvious rash, lesion, or ulcer.   LABORATORY PANEL:   CBC Recent Labs  Lab 02/18/19 0513  WBC 9.9  HGB 15.2  HCT 47.3  PLT 171   ------------------------------------------------------------------------------------------------------------------ Chemistries  Recent Labs  Lab 02/18/19 0513  NA 135  K 3.9  CL 102  CO2 27  GLUCOSE 120*  BUN 14  CREATININE 0.63  CALCIUM 7.8*  MG 2.0  AST 22  ALT 8  ALKPHOS 55  BILITOT 1.2    ------------------------------------------------------------------------------------------------------------------  Cardiac Enzymes Recent Labs  Lab 02/15/19 1418  TROPONINI <0.03   ------------------------------------------------------------------------------------------------------------------  RADIOLOGY:  No results found.   ASSESSMENT AND PLAN:   * Acute hypoxic respiratory failure secondary to influenza A on [place] Continue Tamiflu.  Duonebs as needed.  * Acute metabolic encephalopathy and inpatient delirium over dementia Zyprexa. Ativan PRN  * Chronic atrial fibrillation  Continue home metoprolol.  Cardiac monitoring. Metoprolol  * Multiple thoracic/lumbar compression fractures-seen on CTA chest.  Tylenol and tramadol as needed for pain.  * Hypertension- normotensive in the ED.  Continue home metoprolol.  * COPD- no active wheezing.  Continue home inhalers.  Duonebs PRN.  * Diastolic congestive heart failure-stable.  No signs of volume overload.    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    Continue Eliquis  Family aware of this and is chronic  Anxiety- continue Prozac  Dementia-continue Zyprexa and Namenda Monitor for inpatient delirium  BPH-continue Proscar and and Flomax  All the records are reviewed and case discussed with Care Management/Social Worker Management plans discussed with the patient, family and they are in agreement.  CODE STATUS: FULL CODE  DVT Prophylaxis: SCDs  TOTAL TIME TAKING CARE OF THIS PATIENT: 30 minutes.   POSSIBLE D/C IN 2-3 DAYS, DEPENDING ON CLINICAL CONDITION.  Ricardo Erickson M.D on 02/19/2019 at 9:16 PM  Between 7am to 6pm - Pager - [phone]  After 6pm go to [email or web address] - password [email or web address] [facility]  Office  [phone]  CC: Primary care physician; Rusty Aus, MD  Note: This dictation was prepared with Dragon dictation along with smaller phrase technology. Any transcriptional  errors that result  from this process are unintentional.

## 2019-02-19 NOTE — Care Management Important Message (Signed)
Important Message  Patient Details  Name: KOURTNEY MONTESINOS MRN: [id number] Date of Birth: 02-Mar-1941   Medicare Important Message Given:       Marshell Garfinkel, RN 02/19/2019, 10:21 AM

## 2019-02-19 NOTE — Care Management Important Message (Signed)
Important Message  Patient Details  Name: Ricardo Erickson MRN: [id number] Date of Birth: [birth year]   Medicare Important Message Given:       Marshell Garfinkel, RN 02/19/2019, 10:21 AM

## 2019-02-20 ENCOUNTER — Inpatient Hospital Stay (HOSPITAL_COMMUNITY)
Admit: 2019-02-20 | Discharge: 2019-02-20 | Disposition: A | Discharge status: Home / Self Care | Payer: Medicare Other | Attending: Internal Medicine | Admitting: Internal Medicine

## 2019-02-20 DIAGNOSIS — I5031 Acute diastolic (congestive) heart failure: Secondary | ICD-10-CM

## 2019-02-20 LAB — CULTURE, BLOOD (ROUTINE X 2)
CULTURE: NO GROWTH
Culture: NO GROWTH

## 2019-02-20 LAB — ECHOCARDIOGRAM COMPLETE
Height: 70 in
WEIGHTICAEL: 3017.6 [oz_av]

## 2019-02-20 MED ORDER — FUROSEMIDE 40 MG PO TABS
40.0000 mg | ORAL_TABLET | Freq: Two times a day (BID) | ORAL | Status: DC
  Administered 2019-02-20 – 2019-02-24 (×9): 40 mg via ORAL
  Filled 2019-02-20 (×9): qty 1

## 2019-02-20 NOTE — Plan of Care (Signed)
  Problem: Health Behavior/Discharge Planning: Goal: Ability to manage health-related needs will improve Outcome: Progressing Note:  Patient much more oriented today than he has been, hasn't been impulsive, agitated, or confused. Will continue to monitor. Sitter order discontinued. Ricardo Erickson [facility]

## 2019-02-20 NOTE — Evaluation (Signed)
Physical Therapy Evaluation Patient Details Name: FALCON MCCASKEY MRN: [id number] DOB: 1941/11/06 Today's Date: 02/20/2019   History of Present Illness  presented to ER secondary to AMS, SOB; admitted for management of sepsis related to PNA. Initially requiring BiPAP, now weaned to 3L [place]  Clinical Impression  Upon evaluation, patient alert and oriented to self, basic information; follows simple commands and eager for OOB activities as able.  Generally weak and deconditioned due to current illness, chronic health conditions, but strength and ROM grossly functional for basic transfers and gait efforts.  Significant forward head posture with thoracic kypohosis, R scoliotic curve towards R evident in all sitting/standing postures.  Currently requiring min/mod assist for bed mobility, sit/stand, basic transfers and gait (5') with RW.  Significant SOB with very minimal exertion (sats >90% on 3L); unable to tolerate additional distance/activity as result. Of note, patient HR continues to elevate to 140s with minimal activity; denies chest pain or pressure, but with persistent SOB noted.  Does recovery to baseline (100s) with seated rest x1-2 minutes. Would benefit from skilled PT to address above deficits and promote optimal return to PLOF; recommend transition to  STR upon discharge from acute hospitalization.  Noted in chart that patient/family prefer return to [facility].  If continue to pursue, strongly recommend 24 hour supervision and physical assist; may consider use of manual WC for longer distances. Question patient's ability to safely/indep complete daily activities required to facilitate return to indep living with periods of time alone.   Patient suffers from respiratory failure/PNA/COPD exacerbation which impairs his/her ability to perform daily activities like toileting, feeding, dressing, grooming, bathing in the home. A cane, walker, crutch will not resolve the patient's issue with  performing activities of daily living. A wheelchair is required/recommended and will allow patient to safely perform daily activities.   Patient can safely propel the wheelchair in the home or has a caregiver who can provide assistance.       Follow Up Recommendations SNF    Equipment Recommendations       Recommendations for Other Services       Precautions / Restrictions Precautions Precautions: Fall Restrictions Weight Bearing Restrictions: No      Mobility  Bed Mobility Overal bed mobility: Needs Assistance Bed Mobility: Supine to Sit     Supine to sit: Min assist;Mod assist     General bed mobility comments: extensive assist required due to significant SOB, generalized weakness with minimal activity  Transfers Overall transfer level: Needs assistance Equipment used: Rolling walker (2 wheeled) Transfers: Sit to/from Stand Sit to Stand: Mod assist         General transfer comment: assist for lift off, standing balance  Ambulation/Gait Ambulation/Gait assistance: Mod assist Gait Distance (Feet): 5 Feet Assistive device: Rolling walker (2 wheeled)       General Gait Details: forward flexed posture, very short, choppy steps; min/mod assist from therapist to unweight/advance LEs during gait efforts.  Significant SOB with very minimal exertion (Sats >90% on 3L); additional distance/activity deferred as result  Stairs            Wheelchair Mobility    Modified Rankin (Stroke Patients Only)       Balance Overall balance assessment: Needs assistance Sitting-balance support: Feet supported;No upper extremity supported Sitting balance-Leahy Scale: Fair     Standing balance support: Bilateral upper extremity supported Standing balance-Leahy Scale: Poor  Pertinent Vitals/Pain Pain Assessment: No/denies pain    Home Living Family/patient expects to be discharged to:: ([facility])                       Prior Function Level of Independence: Needs assistance         Comments: Sup/mod indep with quad cane or 4WRW; assist from staff/family/caregiver as needed for ADLs.  Does travel to/from dining hall for at least one (up to three) meal/day     Hand Dominance        Extremity/Trunk Assessment   Upper Extremity Assessment Upper Extremity Assessment: Generalized weakness    Lower Extremity Assessment Lower Extremity Assessment: Generalized weakness(grossly at least 4-/5 throughout)    Cervical / Trunk Assessment Cervical / Trunk Assessment: Kyphotic(significant thoracic kyphosis with scoliotic curvature towards R)  Communication   Communication: No difficulties  Cognition Arousal/Alertness: Awake/alert Behavior During Therapy: WFL for tasks assessed/performed Overall Cognitive Status: No family/caregiver present to determine baseline cognitive functioning                                 General Comments: oriented to basic information; follows commands; pleasant and cooperative      General Comments      Exercises Other Exercises Other Exercises: Extended instruction/education for pursed lip breathing, activity pacing and intermittent rest periods as needed. Patient voices understanding; question full functional understanding.   Assessment/Plan    PT Assessment Patient needs continued PT services  PT Problem List Decreased strength;Decreased range of motion;Decreased activity tolerance;Decreased balance;Decreased mobility;Decreased coordination;Decreased knowledge of use of DME;Decreased safety awareness;Decreased knowledge of precautions;Cardiopulmonary status limiting activity       PT Treatment Interventions DME instruction;Functional mobility training;Therapeutic activities;Therapeutic exercise;Gait training;Balance training;Patient/family education    PT Goals (Current goals can be found in the Care Plan section)  Acute Rehab PT  Goals Patient Stated Goal: to get better PT Goal Formulation: With patient Time For Goal Achievement: 03/06/19 Potential to Achieve Goals: Fair    Frequency Min 2X/week   Barriers to discharge Decreased caregiver support      Co-evaluation               AM-PAC PT "6 Clicks" Mobility  Outcome Measure Help needed turning from your back to your side while in a flat bed without using bedrails?: A Little Help needed moving from lying on your back to sitting on the side of a flat bed without using bedrails?: A Lot Help needed moving to and from a bed to a chair (including a wheelchair)?: A Lot Help needed standing up from a chair using your arms (e.g., wheelchair or bedside chair)?: A Lot Help needed to walk in hospital room?: A Lot Help needed climbing 3-5 steps with a railing? : Total 6 Click Score: 12    End of Session Equipment Utilized During Treatment: Gait belt;Oxygen Activity Tolerance: Patient tolerated treatment well Patient left: in chair;with call bell/phone within reach;with chair alarm set Nurse Communication: Mobility status PT Visit Diagnosis: Muscle weakness (generalized) (M62.81);Difficulty in walking, not elsewhere classified (R26.2)    Time: 6967-8938 PT Time Calculation (min) (ACUTE ONLY): 22 min   Charges:   PT Evaluation $PT Eval Moderate Complexity: 1 Mod PT Treatments $Therapeutic Activity: 8-22 mins        Adriane Gabbert H. Owens Shark, PT, DPT, NCS 02/20/19, 4:07 PM [phone]

## 2019-02-20 NOTE — Progress Notes (Signed)
*  PRELIMINARY RESULTS* Echocardiogram 2D Echocardiogram has been performed.  Sherrie Sport 02/20/2019, 9:41 AM

## 2019-02-20 NOTE — Progress Notes (Signed)
While administering the patient's medications, he stated to this RN that he was having some anxiety.  This RN informed him that he had received pain medication for his chronic back pain, but that we would reevaluate in about an hour and see if he needed an antianxiety medication. Patient amenable to this plan. Currently watching TV with lights dimmed.

## 2019-02-20 NOTE — Progress Notes (Signed)
[place] at [place] NAME: Ricardo Erickson    MR#:  [id number]  DATE OF BIRTH:  July 31, 1941  SUBJECTIVE:  CHIEF COMPLAINT:   Chief Complaint  Patient presents with  . Altered Mental Status   Confused but more awake today  REVIEW OF SYSTEMS:    Review of Systems  Unable to perform ROS: Dementia   DRUG ALLERGIES:  No Known Allergies  VITALS:  Blood pressure (!) 114/91, pulse 100, temperature (!) 97.4 F (36.3 C), temperature source Oral, resp. rate 19, height 5\' 10"  (1.778 m), weight 85.5 kg, SpO2 90 %.  PHYSICAL EXAMINATION:   Physical Exam  GENERAL:  78 y.o.-year-old patient lying in the bed EYES: Pupils equal, round, reactive to light and accommodation. No scleral icterus. Extraocular muscles intact.  HEENT: Head atraumatic, normocephalic. Oropharynx and nasopharynx clear.  NECK:  Supple, no jugular venous distention. No thyroid enlargement, no tenderness.  LUNGS: Decreased breath sounds bilaterally CARDIOVASCULAR: S1, S2 normal. No murmurs, rubs, or gallops.  ABDOMEN: Soft, nontender, nondistended. Bowel sounds present. No organomegaly or mass.  EXTREMITIES: No cyanosis, clubbing or edema b/l.    NEUROLOGIC: Cranial nerves II through XII are intact. No focal Motor or sensory deficits b/l.   PSYCHIATRIC: The patient is confused SKIN: No obvious rash, lesion, or ulcer.   LABORATORY PANEL:   CBC Recent Labs  Lab 02/18/19 0513  WBC 9.9  HGB 15.2  HCT 47.3  PLT 171   ------------------------------------------------------------------------------------------------------------------ Chemistries  Recent Labs  Lab 02/18/19 0513  NA 135  K 3.9  CL 102  CO2 27  GLUCOSE 120*  BUN 14  CREATININE 0.63  CALCIUM 7.8*  MG 2.0  AST 22  ALT 8  ALKPHOS 55  BILITOT 1.2   ------------------------------------------------------------------------------------------------------------------  Cardiac Enzymes Recent Labs  Lab  02/15/19 1418  TROPONINI <0.03   ------------------------------------------------------------------------------------------------------------------  RADIOLOGY:  No results found.   ASSESSMENT AND PLAN:   * Acute hypoxic respiratory failure secondary to influenza A on [place] 3 L O2 Continue Tamiflu.  Duonebs as needed.  * Acute metabolic encephalopathy and inpatient delirium over dementia Zyprexa. Ativan PRN  * Chronic atrial fibrillation with rapid ventricular rate Increased dose of metoprolol.  * Multiple thoracic/lumbar compression fractures-seen on CTA chest.  Tylenol and tramadol as needed for pain.  * Hypertension- normotensive in the ED.  Continue home metoprolol.  * COPD- no active wheezing.  Continue home inhalers.  Duonebs PRN.  * Diastolic congestive heart failure-stable.  No signs of volume overload.    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    Continue Eliquis  Family aware of this and is chronic.  Anxiety- continue Prozac  Dementia-continue Zyprexa and Namenda Monitor for inpatient delirium  BPH-continue Proscar and and Flomax  All the records are reviewed and case discussed with Care Management/Social Worker Management plans discussed with the patient, family and they are in agreement.  CODE STATUS: FULL CODE  DVT Prophylaxis: SCDs  TOTAL TIME TAKING CARE OF THIS PATIENT: 30 minutes.   POSSIBLE D/C IN 1-2 DAYS, DEPENDING ON CLINICAL CONDITION.  Neita Carp M.D on 02/20/2019 at 2:27 PM  Between 7am to 6pm - Pager - [phone]  After 6pm go to [email or web address] - password [email or web address] [place] [facility]  Office  [phone]  CC: Primary care physician; Rusty Aus, MD  Note: This dictation was prepared with Dragon dictation along with smaller phrase technology. Any transcriptional errors that result from this process are unintentional.

## 2019-02-21 NOTE — Progress Notes (Signed)
   02/21/19 1700  Clinical Encounter Type  Visited With Patient  Visit Type Follow-up;Social support  Referral From Nurse  Spiritual Encounters  Spiritual Needs Prayer;Emotional   Chaplain received a page from the unit secretary regarding the patient's interest in talking with a patient. Upon arrival, the patient was sitting up in bed and watching television. His presenting concern was that he was very lonely, that he felt "like a prisoner", and did not understand why God would allow him to go through these things. He said that he believes in God, but also finds himself angry with God at times because of the challenges he is facing and has faced in life. Chaplain and patient talked about Job from the Bible and how he dealt with the way his life unfolded; he also lost a lot and questions God. The patient later expressed an interest in being able to walk around a bit and get out of the room; the chaplain encouraged him to remain in the bed and agreed to make the nurse aware. The chaplain offered a listening ear, provided emotional support, and tried to provide encouragement to him in this time of frustration. He describes the place where he currently lives as a place where the people are kind to him. The patient also expressed a desire to see his family and grandchildren. He presents as lonely and requested that the chaplain visit with him again later.

## 2019-02-21 NOTE — Clinical Social Work Note (Signed)
CSW received referral for SNF.  Case discussed with case manager and plan is to discharge home with home health.  CSW to sign off please re-consult if social work needs arise.  Quintel Mccalla R. Nuria Phebus, MSW, LCSW [phone]  

## 2019-02-21 NOTE — TOC Progression Note (Signed)
Transition of Care [facility]) - Progression Note    Patient Details  Name: Ricardo Erickson MRN: [id number] Date of Birth: [birth year]  [facility] of [facility]) CM/SW Contact  Elza Rafter, RN Phone Number: 02/21/2019, 10:22 AM  Clinical Narrative:   Spoke with son, [name].  He is aware of PT evaluation yesterday.  He states he would still like to have his father discharge back to [place] with home health services when medically stable.      Expected Discharge Plan: Assisted Living(Home health) Barriers to Discharge: Continued Medical Work up  Expected Discharge Plan and Services Expected Discharge Plan: Assisted Living(Home health) Discharge Planning Services: CM Consult Post Acute Care Choice: [place] arrangements for the past 2 months: [place]: [place] ([facility])   Social Determinants of Health (SDOH) Interventions    Readmission Risk Interventions 30 Day Unplanned Readmission Risk Score     ED to Hosp-Admission (Current) from 02/15/2019 in [place] (2A)  30 Day Unplanned Readmission Risk Score (%)  22 Filed at 02/21/2019 0800     This score is the patient's risk of an unplanned readmission within 30 days of being discharged (0 -100%). The score is based on dignosis, age, lab data, medications, orders, and past utilization.   Low:  0-14.9   Medium: 15-21.9   High: 22-29.9   Extreme: 30 and above       No flowsheet data found.

## 2019-02-21 NOTE — Progress Notes (Signed)
[place] at [place] NAME: Ricardo Erickson    MR#:  [id number]  DATE OF BIRTH:  November 22, 1941  SUBJECTIVE:  CHIEF COMPLAINT:   Chief Complaint  Patient presents with  . Altered Mental Status   Awake and alert.  Confused.  Heart rate went up to 160 with atrial fibrillation when he got out of bed with physical therapy.  REVIEW OF SYSTEMS:    Review of Systems  Unable to perform ROS: Dementia   DRUG ALLERGIES:  No Known Allergies  VITALS:  Blood pressure 113/66, pulse 74, temperature 97.6 F (36.4 C), temperature source Oral, resp. rate 19, height 5\' 10"  (1.778 m), weight 84.5 kg, SpO2 92 %.  PHYSICAL EXAMINATION:   Physical Exam  GENERAL:  78 y.o.-year-old patient lying in the bed EYES: Pupils equal, round, reactive to light and accommodation. No scleral icterus. Extraocular muscles intact.  HEENT: Head atraumatic, normocephalic. Oropharynx and nasopharynx clear.  NECK:  Supple, no jugular venous distention. No thyroid enlargement, no tenderness.  LUNGS: Decreased breath sounds bilaterally CARDIOVASCULAR: S1, S2 irregular ABDOMEN: Soft, nontender, nondistended. Bowel sounds present. No organomegaly or mass.  EXTREMITIES: No cyanosis, clubbing or edema b/l.    NEUROLOGIC: Cranial nerves II through XII are intact. No focal Motor or sensory deficits b/l.   PSYCHIATRIC: The patient is confused SKIN: No obvious rash, lesion, or ulcer.   LABORATORY PANEL:   CBC Recent Labs  Lab 02/18/19 0513  WBC 9.9  HGB 15.2  HCT 47.3  PLT 171   ------------------------------------------------------------------------------------------------------------------ Chemistries  Recent Labs  Lab 02/18/19 0513  NA 135  K 3.9  CL 102  CO2 27  GLUCOSE 120*  BUN 14  CREATININE 0.63  CALCIUM 7.8*  MG 2.0  AST 22  ALT 8  ALKPHOS 55  BILITOT 1.2    ------------------------------------------------------------------------------------------------------------------  Cardiac Enzymes Recent Labs  Lab 02/15/19 1418  TROPONINI <0.03   ------------------------------------------------------------------------------------------------------------------  RADIOLOGY:  No results found.   ASSESSMENT AND PLAN:   * Acute hypoxic respiratory failure secondary to influenza A on [place] 2 L O2 Finished Tamiflu.  Duonebs as needed. Home oxygen orders placed  * Acute metabolic encephalopathy and inpatient delirium over dementia Zyprexa. Ativan PRN  * Chronic atrial fibrillation with rapid ventricular rate Increased dose of metoprolol. Heart rate increased to 160s today.  Metoprolol dose was increased yesterday.  Will not add any further medications as at rest heart rate is in the 60s to 70s.  * Multiple thoracic/lumbar compression fractures-seen on CTA chest.  Tylenol and tramadol as needed for pain.  * Hypertension- normotensive in the ED.  Continue home metoprolol.  * COPD- no active wheezing.  Continue home inhalers.  Duonebs PRN.  * Diastolic congestive heart failure-stable.  No signs of volume overload.    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    Continue Eliquis  Family aware of this and is chronic.  Anxiety- continue Prozac  Dementia-continue Zyprexa and Namenda Monitor for inpatient delirium  BPH-continue Proscar and and Flomax  Discharge home with home health tomorrow if heart rate stable  All the records are reviewed and case discussed with Care Management/Social Worker Management plans discussed with the patient, family and they are in agreement.  CODE STATUS: DNR  DVT Prophylaxis: SCDs  TOTAL TIME TAKING CARE OF THIS PATIENT: 30 minutes.   POSSIBLE D/C IN 1-2 DAYS, DEPENDING ON CLINICAL CONDITION.  Neita Carp M.D on 02/21/2019 at 2:42 PM  Between 7am to 6pm -  Pager - [phone]  After 6pm go to  [email or web address] - password [email or web address] [place] [facility]  Office  [phone]  CC: Primary care physician; Rusty Aus, MD  Note: This dictation was prepared with Dragon dictation along with smaller phrase technology. Any transcriptional errors that result from this process are unintentional.

## 2019-02-21 NOTE — Care Management (Signed)
This RNCM spoke with patient's son regarding choice of home health agency after I received a call from [place] with [facility] after she talked with someone at [facility]. [name] was now confused about what was needed. This RNCM explained the difference between outpatient ([facility]) and home health. [facility] goes into patient's home but bills insurance at lower cost. [facility] can only provide therapy services.  I explained to him that [facility] and [facility] have a partnership.  He has changed to [facility] and hopes that patient can get "all services needed at time of discharge".  I have updated Helene Kelp with [facility] and asked Corene Cornea with [facility] to close. Case.  I have requested callback from [facility] to discuss case regarding son's overload of information to better meet the needs of our community and patient.

## 2019-02-21 NOTE — Plan of Care (Signed)
  Problem: Health Behavior/Discharge Planning: Goal: Ability to manage health-related needs will improve Outcome: Not Progressing Note:  Patient very sad / depressed this afternoon. Had a long discussion and tried to cheer the patient up. Waiting possible d/c later in shift. Will continue to monitor / medicate. Ricardo Erickson [facility]

## 2019-02-22 ENCOUNTER — Ambulatory Visit: Payer: Medicare Other

## 2019-02-22 ENCOUNTER — Ambulatory Visit: Payer: Self-pay | Admitting: Urology

## 2019-02-22 DIAGNOSIS — I4819 Other persistent atrial fibrillation: Secondary | ICD-10-CM

## 2019-02-22 MED ORDER — METOPROLOL TARTRATE 5 MG/5ML IV SOLN
5.0000 mg | INTRAVENOUS | Status: DC | PRN
  Administered 2019-02-22: 5 mg via INTRAVENOUS
  Filled 2019-02-22: qty 5

## 2019-02-22 MED ORDER — AMIODARONE HCL 200 MG PO TABS
400.0000 mg | ORAL_TABLET | Freq: Every day | ORAL | Status: DC
  Administered 2019-02-22 – 2019-02-24 (×3): 400 mg via ORAL
  Filled 2019-02-22 (×3): qty 2

## 2019-02-22 NOTE — Progress Notes (Signed)
Patient ID: Ricardo Erickson, male   DOB: 1940/12/13, 78 y.o.   MRN: [id number]  [employer] PROGRESS NOTE  Ricardo Erickson IRC:[id number] DOB: 12/04/1941 DOA: 02/15/2019 PCP: Rusty Aus, MD  HPI/Subjective: Called by nursing staff that his heart rate was 150 sustaining.  Apparently they did not give the metoprolol last night.  Patient feels his heart rate being fast.  Also a little anxious.  Spoke with son on the phone that I likely will not be able to get him out of the hospital today.  Son mentioned that we should not give him any Ativan because then he gets sedated.  Objective: Vitals:   02/22/19 0807 02/22/19 0812  BP: 108/76   Pulse: (!) 133 (!) 149  Resp: (!) 34   Temp: 97.7 F (36.5 C)   SpO2: 96%     Filed Weights   02/15/19 1413 02/20/19 0551 02/21/19 0305  Weight: 95.3 kg 85.5 kg 84.5 kg    ROS: Review of Systems  Constitutional: Negative for chills and fever.  Eyes: Negative for blurred vision.  Respiratory: Positive for cough and shortness of breath.   Cardiovascular: Positive for palpitations. Negative for chest pain.  Gastrointestinal: Negative for abdominal pain, constipation, diarrhea, nausea and vomiting.  Genitourinary: Negative for dysuria.  Musculoskeletal: Negative for joint pain.  Neurological: Negative for dizziness and headaches.   Exam: Physical Exam  HENT:  Nose: No mucosal edema.  Mouth/Throat: No oropharyngeal exudate or posterior oropharyngeal edema.  Eyes: Pupils are equal, round, and reactive to light. Conjunctivae and lids are normal.  Neck: No JVD present. Carotid bruit is not present. No edema present. No thyroid mass and no thyromegaly present.  Cardiovascular: S1 normal and S2 normal. An irregularly irregular rhythm present. Tachycardia present. Exam reveals no gallop.  No murmur heard. Pulses:      Dorsalis pedis pulses are 2+ on the right side and 2+ on the left side.  Respiratory: No respiratory distress. He has decreased breath  sounds in the left middle field and the left lower field. He has no wheezes. He has no rhonchi. He has no rales.  GI: Soft. Bowel sounds are normal. There is no abdominal tenderness.  Musculoskeletal:     Right ankle: He exhibits swelling.     Left ankle: He exhibits swelling.  Lymphadenopathy:    He has no cervical adenopathy.  Neurological: He is alert. No cranial nerve deficit.  Skin: Skin is warm. No rash noted. Nails show no clubbing.  Psychiatric: His mood appears anxious.      Data Reviewed: Basic Metabolic Panel: Recent Labs  Lab 02/15/19 1418 02/16/19 0329 02/16/19 0544 02/18/19 0513  NA 139 138  --  135  K 4.1 3.7  --  3.9  CL 101 97*  --  102  CO2 28 30  --  27  GLUCOSE 109* 105*  --  120*  BUN 13 17  --  14  CREATININE 0.86 0.95  --  0.63  CALCIUM 9.0 8.4*  --  7.8*  MG  --   --  2.1 2.0  PHOS  --   --  3.4 3.2   Liver Function Tests: Recent Labs  Lab 02/15/19 1418 02/18/19 0513  AST 21 22  ALT 12 8  ALKPHOS 81 55  BILITOT 1.3* 1.2  PROT 6.8 5.5*  ALBUMIN 3.9 2.9*   CBC: Recent Labs  Lab 02/15/19 1128 02/15/19 1418 02/16/19 0329 02/17/19 0533 02/18/19 0513  WBC 15.8* 16.2* 13.5* 10.8*  9.9  NEUTROABS 13.7*  --   --   --   --   HGB 16.6 16.8 15.0 15.1 15.2  HCT 51.9 53.4* 48.0 47.1 47.3  MCV 91.7 92.9 93.9 92.0 90.8  PLT 230 229 211 164 171   Cardiac Enzymes: Recent Labs  Lab 02/15/19 1418  TROPONINI <0.03   BNP (last 3 results) Recent Labs    07/03/18 0103 08/22/18 1810 02/15/19 1419  BNP 102.0* 115.0* 180.0*     CBG: Recent Labs  Lab 02/15/19 2058  GLUCAP 100*    Recent Results (from the past 240 hour(s))  Culture, blood (Routine x 2)     Status: None   Collection Time: 02/15/19  2:53 PM  Result Value Ref Range Status   Specimen Description BLOOD BLOOD RIGHT FOREARM  Final   Special Requests   Final    BOTTLES DRAWN AEROBIC AND ANAEROBIC Blood Culture results may not be optimal due to an excessive volume of blood  received in culture bottles   Culture   Final    NO GROWTH 5 DAYS Performed at [facility], [address] [place], [place] [address]    Report Status 02/20/2019 FINAL  Final  Culture, blood (Routine x 2)     Status: None   Collection Time: 02/15/19  2:53 PM  Result Value Ref Range Status   Specimen Description BLOOD BLOOD LEFT FOREARM  Final   Special Requests   Final    BOTTLES DRAWN AEROBIC AND ANAEROBIC Blood Culture results may not be optimal due to an excessive volume of blood received in culture bottles   Culture   Final    NO GROWTH 5 DAYS Performed at [facility], [address] [place], [place] [address]    Report Status 02/20/2019 FINAL  Final  MRSA PCR Screening     Status: None   Collection Time: 02/15/19  9:07 PM  Result Value Ref Range Status   MRSA by PCR NEGATIVE NEGATIVE Final    Comment:        The GeneXpert MRSA Assay (FDA approved for NASAL specimens only), is one component of a comprehensive MRSA colonization surveillance program. It is not intended to diagnose MRSA infection nor to guide or monitor treatment for MRSA infections. Performed at [facility], [address] [place], [place] [address]     Scheduled Meds: . apixaban  5 mg Oral BID  . budesonide (PULMICORT) nebulizer solution  0.25 mg Nebulization BID  . chlorhexidine  15 mL Mouth Rinse BID  . finasteride  5 mg Oral Daily  . FLUoxetine  10 mg Oral Daily  . furosemide  40 mg Oral BID  . ipratropium-albuterol  3 mL Nebulization Q6H  . mouth rinse  15 mL Mouth Rinse q12n4p  . memantine  5 mg Oral BID  . metoprolol tartrate  100 mg Oral BID  . nicotine  14 mg Transdermal Daily  . OLANZapine  1.25 mg Oral QHS  . pramipexole  1 mg Oral QHS  . tamsulosin  0.8 mg Oral QPC supper   Continuous Infusions:  Assessment/Plan:  1. Atrial fibrillation with rapid ventricular response.  Came up into the room to help push IV metoprolol but the IV was leaking and  they needed to place another IV.  We will give IV metoprolol as needed and the oral metoprolol.  No holding parameters on oral metoprolol.  Depending on what the heart rate does may have to give other medications. 2. Acute  hypoxic respiratory failure secondary to influenza A infection.  Try to taper off oxygen.  Finish Tamiflu. 3. Acute metabolic encephalopathy with inpatient delirium.  History of dementia.  Get rid of Ativan.  Continue Zyprexa. 4. Multiple thoracic lumbar compression fractures seen on CT.  PRN Tylenol and tramadol 5. Hypertension.  Continue metoprolol 6. COPD.  Continue inhalers 7. History of diastolic congestive heart failure 8. 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 Anxiety on Prozac 10. Dementia on Zyprexa and Namenda 11. BPH on Proscar and Flomax  Code Status:     Code Status Orders  (From admission, onward)         Start     Ordered   02/19/19 1456  Do not attempt resuscitation (DNR)  Continuous    Question Answer Comment  In the event of cardiac or respiratory ARREST Do not call a "code blue"   In the event of cardiac or respiratory ARREST Do not perform Intubation, CPR, defibrillation or ACLS   In the event of cardiac or respiratory ARREST Use medication by any route, position, wound care, and other measures to relive pain and suffering. May use oxygen, suction and manual treatment of airway obstruction as needed for comfort.      02/19/19 1455        Code Status History    Date Active Date Inactive Code Status Order ID Comments User Context   02/15/2019 2104 02/19/2019 1455 Full Code [id number]  Sela Hua, MD Inpatient   11/27/2018 1042 11/28/2018 1939 Full Code [id number]  Gladstone Lighter, MD ED   11/18/2017 1245 11/22/2017 2031 DNR [id number]  Hillary Bow, MD Inpatient   11/18/2017 0854 11/18/2017 1245 Full Code [id number]  Hillary Bow, MD ED   07/21/2017 1308 07/28/2017 1900 DNR [id number]  Max Sane, MD Inpatient    07/20/2017 0058 07/21/2017 1308 Full Code [id number]  Lance Coon, MD Inpatient   06/24/2017 2235 06/30/2017 0312 Full Code [id number]  Fritzi Mandes, MD Inpatient     Family Communication: Spoke with son on the phone Disposition Plan: Control heart rate today.  Potential disposition tomorrow if heart rate better controlled today  Time spent: 28 minutes  [place]

## 2019-02-22 NOTE — Progress Notes (Signed)
Pt having episodes of confusion and lucidity throughout shift.  Pt reorients easily to place, and events.  When lucid, pt is A&Ox4, and able to discuss appropriately.

## 2019-02-22 NOTE — Progress Notes (Signed)
Physical Therapy Treatment Patient Details Name: Ricardo Erickson MRN: [id number] DOB: 02-22-1941 Today's Date: 02/22/2019    History of Present Illness presented to ER secondary to AMS, SOB; admitted for management of sepsis related to PNA. Initially requiring BiPAP, now weaned to 3L [place]    PT Comments    Pt in recliner.  Chart reviewed and aware of increased HR this am.  Pt 100'110's at rest in recliner.  Pt was able to stand with mod a x 1 to walker.  Once standing HR noted to increase to low 130's.  Pt returned to sitting.  HR back to baseline.  Participated in exercises as described below.  Primary RN aware.   Follow Up Recommendations  SNF     Equipment Recommendations       Recommendations for Other Services       Precautions / Restrictions Precautions Precautions: Fall Precaution Comments: HR Restrictions Weight Bearing Restrictions: No    Mobility  Bed Mobility               General bed mobility comments: in recliner before and after session  Transfers Overall transfer level: Needs assistance Equipment used: Rolling walker (2 wheeled) Transfers: Sit to/from Stand Sit to Stand: Mod assist         General transfer comment: assist for lift off, standing balance  Ambulation/Gait                 Stairs             Wheelchair Mobility    Modified Rankin (Stroke Patients Only)       Balance Overall balance assessment: Needs assistance Sitting-balance support: Feet supported;No upper extremity supported Sitting balance-Leahy Scale: Fair     Standing balance support: Bilateral upper extremity supported Standing balance-Leahy Scale: Poor                              Cognition Arousal/Alertness: Awake/alert Behavior During Therapy: WFL for tasks assessed/performed Overall Cognitive Status: No family/caregiver present to determine baseline cognitive functioning                                 General  Comments: oriented to basic information; follows commands; pleasant and cooperative      Exercises Other Exercises Other Exercises: seated AROM for ankle pumps, LAQ and marches with mod verbal cues for full range and to stay with activity.    General Comments        Pertinent Vitals/Pain Pain Assessment: No/denies pain    Home Living                      Prior Function            PT Goals (current goals can now be found in the care plan section) Progress towards PT goals: Progressing toward goals    Frequency    Min 2X/week      PT Plan Current plan remains appropriate    Co-evaluation              AM-PAC PT "6 Clicks" Mobility   Outcome Measure  Help needed turning from your back to your side while in a flat bed without using bedrails?: A Little Help needed moving from lying on your back to sitting on the side of a flat bed without using bedrails?: A Lot Help  needed moving to and from a bed to a chair (including a wheelchair)?: A Lot Help needed standing up from a chair using your arms (e.g., wheelchair or bedside chair)?: A Lot Help needed to walk in hospital room?: A Lot Help needed climbing 3-5 steps with a railing? : Total 6 Click Score: 12    End of Session Equipment Utilized During Treatment: Gait belt;Oxygen Activity Tolerance: Treatment limited secondary to medical complications (Comment) Patient left: in chair;with call bell/phone within reach;with chair alarm set         Time: 1340-1355 PT Time Calculation (min) (ACUTE ONLY): 15 min  Charges:  $Therapeutic Activity: 8-22 mins                     Chesley Noon, PTA 02/22/19, 2:46 PM

## 2019-02-22 NOTE — Plan of Care (Signed)
  Problem: Health Behavior/Discharge Planning: Goal: Ability to manage health-related needs will improve Outcome: Progressing   Problem: Clinical Measurements: Goal: Ability to maintain clinical measurements within normal limits will improve Outcome: Progressing Goal: Will remain free from infection Outcome: Progressing Goal: Diagnostic test results will improve Outcome: Progressing   

## 2019-02-22 NOTE — TOC Progression Note (Signed)
Transition of Care [facility]) - Progression Note    Patient Details  Name: Ricardo Erickson MRN: [id number] Date of Birth: 1941-03-29  Transition of [facility]) CM/SW Contact  Elza Rafter, RN Phone Number: 02/22/2019, 9:24 AM  Clinical Narrative:   Ricardo Erickson to discharge- heart rate 150.      Expected Discharge Plan: [facility](paient lives a [facility] living) Barriers to Discharge: Continued Medical Work up  Expected Discharge Plan and Services Expected Discharge Plan: [facility](paient lives a [facility] living) Discharge Planning Services: CM Consult Post Acute Care Choice: [place] arrangements for the past 2 months: Apartment                     HH Arranged: Therapist, sports, PT, OT, Nurse's Aide, Social Work [employer] Agency: [facility] [employer]   Social Determinants of Health ([place]) Interventions    Readmission Risk Interventions 30 Day Unplanned Readmission Risk Score     ED to Hosp-Admission (Current) from 02/15/2019 in [place] (2A)  30 Day Unplanned Readmission Risk Score (%)  21 Filed at 02/22/2019 0801     This score is the patient's risk of an unplanned readmission within 30 days of being discharged (0 -100%). The score is based on dignosis, age, lab data, medications, orders, and past utilization.   Low:  0-14.9   Medium: 15-21.9   High: 22-29.9   Extreme: 30 and above       No flowsheet data found.

## 2019-02-23 MED ORDER — DOCUSATE SODIUM 100 MG PO CAPS: 100.0000 mg | ORAL_CAPSULE | Freq: Two times a day (BID) | ORAL | 0 refills | Status: AC

## 2019-02-23 MED ORDER — AMIODARONE HCL 200 MG PO TABS: ORAL_TABLET | ORAL | 0 refills | Status: AC

## 2019-02-23 MED ORDER — BISACODYL 10 MG RE SUPP
10.0000 mg | RECTAL | Status: AC
  Administered 2019-02-23: 10 mg via RECTAL
  Filled 2019-02-23: qty 1

## 2019-02-23 MED ORDER — METOPROLOL TARTRATE 50 MG PO TABS: 50.0000 mg | ORAL_TABLET | Freq: Two times a day (BID) | ORAL | 0 refills | Status: DC

## 2019-02-23 MED ORDER — METOPROLOL TARTRATE 50 MG PO TABS
50.0000 mg | ORAL_TABLET | Freq: Two times a day (BID) | ORAL | Status: DC
  Administered 2019-02-23 – 2019-02-24 (×3): 50 mg via ORAL
  Filled 2019-02-23 (×3): qty 1

## 2019-02-23 MED ORDER — FLEET ENEMA 7-19 GM/118ML RE ENEM: 1.0000 | ENEMA | Freq: Every day | RECTAL | Status: DC | PRN

## 2019-02-23 MED ORDER — TRAMADOL HCL 50 MG PO TABS: 50.0000 mg | ORAL_TABLET | Freq: Two times a day (BID) | ORAL | 0 refills | Status: DC

## 2019-02-23 MED ORDER — NICOTINE 14 MG/24HR TD PT24: MEDICATED_PATCH | TRANSDERMAL | 0 refills | Status: DC

## 2019-02-23 MED ORDER — MEMANTINE HCL 5 MG PO TABS: 5.0000 mg | ORAL_TABLET | Freq: Two times a day (BID) | ORAL | 0 refills | Status: AC

## 2019-02-23 MED ORDER — FLEET ENEMA 7-19 GM/118ML RE ENEM: 1.0000 | ENEMA | Freq: Once | RECTAL | Status: DC

## 2019-02-23 NOTE — Discharge Summary (Addendum)
[facility] NAME: Ricardo Erickson    MR#:  [id number]  DATE OF BIRTH:  Jul 21, 1941  DATE OF ADMISSION:  02/15/2019 ADMITTING PHYSICIAN: Sela Hua, MD  DATE OF DISCHARGE: 02/24/2019  PRIMARY CARE PHYSICIAN: Rusty Aus, MD    ADMISSION DIAGNOSIS:  Acute respiratory distress [R06.03] Influenza A [J10.1]  DISCHARGE DIAGNOSIS:  Active Problems:   Influenza A   SECONDARY DIAGNOSIS:   Past Medical History:  Diagnosis Date  . CHF (congestive heart failure) ([place])   . Chronic back pain   . Dementia ([place])   . Depression   . Emphysema lung ([name])   . Hypertension   . Personal history of tobacco use, presenting hazards to health 08/06/2015  . Pollen allergies   . PTSD (post-traumatic stress disorder)   . Pulmonary embolism ([place])     HOSPITAL COURSE:   1.  Atrial fibrillation with rapid ventricular response.  I had to start amiodarone 400 mg daily.  Will taper down to 200 mg daily starting in 3 days.  We went up on his metoprolol all the way up to 100 mg twice daily but since blood pressure was on the lower side I decreased metoprolol down to 50 mg twice a day.  Patient is on Eliquis.  Heart rate trending better and in the 80s and 90s at rest.  Heart rate with ambulation stayed lower than 120. 2.  Acute hypoxic respiratory failure secondary to influenza A infection.  Patient was tapered off oxygen and with walking held his saturations above 93%.  No need for oxygen currently.  Patient finished Tamiflu already. 3.  Acute metabolic encephalopathy with acute delirium.  Ativan is not a good medication for him.  Continue Zyprexa.  Mental status much better today. 4.  Multiple thoracic lumbar compression fracture seen on CT scan.  PRN Tylenol for pain tramadol as needed. 5.  Essential hypertension on metoprolol 6.  COPD with slight wheeze in the left lung secondary to influenza.  Continue nebulizers and inhalers 7.  History of diastolic  congestive heart failure.  No signs of congestive heart failure currently 8.  Chronic pulmonary embolism seen on CT scan.  Patient already on Eliquis 9.  Anxiety on Prozac 10.  Dementia on Zyprexa and Namenda 11.  BPH on Proscar and Flomax 12.  Tobacco abuse.  Nicotine patch. 13.  Patient is a DO NOT RESUSCITATE 14.  Weakness.  Physical therapy recommended rehab.  Son would like to get him back in his familiar environment since he does well there.  Home health set up. 15 constipation.  Colace upon discharge  DISCHARGE CONDITIONS:   Satisfactory  CONSULTS OBTAINED:  None  DRUG ALLERGIES:   Allergies  Allergen Reactions  . Ativan [Lorazepam] Other (See Comments)    Patient gets severe altered mental status and confusion when taking this medicine    DISCHARGE MEDICATIONS:   Allergies as of 02/23/2019      Reactions   Ativan [lorazepam] Other (See Comments)   Patient gets severe altered mental status and confusion when taking this medicine      Medication List    STOP taking these medications   metoprolol succinate 50 MG 24 hr tablet Commonly known as:  TOPROL-XL     TAKE these medications   acetaminophen 650 MG CR tablet Commonly known as:  TYLENOL Take 650 mg by mouth every 8 (eight) hours as needed for pain.   Advair Diskus 250-50 MCG/DOSE Aepb  Generic drug:  Fluticasone-Salmeterol Inhale 1 puff into the lungs every 12 (twelve) hours.   albuterol 108 (90 Base) MCG/ACT inhaler Commonly known as:  PROVENTIL HFA;VENTOLIN HFA Inhale 2 puffs into the lungs every 4 (four) hours as needed for wheezing or shortness of breath.   albuterol (2.5 MG/3ML) 0.083% nebulizer solution Commonly known as:  PROVENTIL Inhale 2.5 mg into the lungs 4 (four) times daily.   amiodarone 200 MG tablet Commonly known as:  PACERONE Two tabs po daily for three more days then take one tablet daily afterwards   apixaban 5 MG Tabs tablet Commonly known as:  ELIQUIS Take 1 tablet (5 mg  total) by mouth 2 (two) times daily.   docusate sodium 100 MG capsule Commonly known as:  Colace Take 1 capsule (100 mg total) by mouth 2 (two) times daily.   finasteride 5 MG tablet Commonly known as:  Proscar Take 1 tablet (5 mg total) by mouth daily.   FLUoxetine 10 MG capsule Commonly known as:  PROZAC Take 10 mg by mouth daily.   furosemide 20 MG tablet Commonly known as:  LASIX Take 20 mg by mouth every morning.   lidocaine 5 % Commonly known as:  LIDODERM Place 1 patch onto the skin daily. Remove & Discard patch within 12 hours or as directed by MD   memantine 5 MG tablet Commonly known as:  NAMENDA Take 1 tablet (5 mg total) by mouth 2 (two) times daily.   metoprolol tartrate 50 MG tablet Commonly known as:  LOPRESSOR Take 1 tablet (50 mg total) by mouth 2 (two) times daily.   nicotine 14 mg/24hr patch Commonly known as:  NICODERM CQ - dosed in mg/24 hours 14 mg patch transdermal daily (okay to substitute generic)   OLANZapine 2.5 MG tablet Commonly known as:  ZYPREXA Take 1.25 mg by mouth at bedtime.   potassium chloride 10 MEQ tablet Commonly known as:  K-DUR Take 10 mEq by mouth daily.   pramipexole 1 MG tablet Commonly known as:  MIRAPEX Take 1 tablet (1 mg total) by mouth at bedtime.   Spiriva HandiHaler 18 MCG inhalation capsule Generic drug:  tiotropium Place 18 mcg into inhaler and inhale daily.   tamsulosin 0.4 MG Caps capsule Commonly known as:  FLOMAX Take 2 capsules (0.8 mg total) by mouth daily after supper.   traMADol 50 MG tablet Commonly known as:  Ultram Take 1 tablet (50 mg total) by mouth 2 (two) times daily.        DISCHARGE INSTRUCTIONS:   Follow-up PMD 5 days Home health set up  If you experience worsening of your admission symptoms, develop shortness of breath, life threatening emergency, suicidal or homicidal thoughts you must seek medical attention immediately by calling 911 or calling your MD immediately  if symptoms  less severe.  You Must read complete instructions/literature along with all the possible adverse reactions/side effects for all the Medicines you take and that have been prescribed to you. Take any new Medicines after you have completely understood and accept all the possible adverse reactions/side effects.   Please note  You were cared for by a hospitalist during your hospital stay. If you have any questions about your discharge medications or the care you received while you were in the hospital after you are discharged, you can call the unit and asked to speak with the hospitalist on call if the hospitalist that took care of you is not available. Once you are discharged, your primary care physician  will handle any further medical issues. Please note that NO REFILLS for any discharge medications will be authorized once you are discharged, as it is imperative that you return to your primary care physician (or establish a relationship with a primary care physician if you do not have one) for your aftercare needs so that they can reassess your need for medications and monitor your lab values.    Today   CHIEF COMPLAINT:   Chief Complaint  Patient presents with  . Altered Mental Status    HISTORY OF PRESENT ILLNESS:  Amish Mintzer  is a 78 y.o. male came in with altered mental status   VITAL SIGNS:  Blood pressure 114/87, pulse 72, temperature (!) 97.5 F (36.4 C), temperature source Oral, resp. rate 20, height 5\' 10"  (1.778 m), weight 84.5 kg, SpO2 93 %.    PHYSICAL EXAMINATION:  GENERAL:  78 y.o.-year-old patient lying in the bed with no acute distress.  EYES: Pupils equal, round, reactive to light and accommodation. No scleral icterus. Extraocular muscles intact.  HEENT: Head atraumatic, normocephalic. Oropharynx and nasopharynx clear.  NECK:  Supple, no jugular venous distention. No thyroid enlargement, no tenderness.  LUNGS: Normal breath sounds bilaterally, slight expiratory  wheezing left lung.no rales,rhonchi or crepitation. No use of accessory muscles of respiration.  CARDIOVASCULAR: S1, S2 normal. No murmurs, rubs, or gallops.  ABDOMEN: Soft, non-tender, non-distended. Bowel sounds present. No organomegaly or mass.  EXTREMITIES: Trace pedal edema, no cyanosis, or clubbing.  NEUROLOGIC: Cranial nerves II through XII are intact. Muscle strength 5/5 in all extremities. Sensation intact. Gait not checked.  PSYCHIATRIC: The patient is alert and answering questions appropriately today.  SKIN: Chronic lower extremity skin discoloration  DATA REVIEW:   CBC Recent Labs  Lab 02/18/19 0513  WBC 9.9  HGB 15.2  HCT 47.3  PLT 171    Chemistries  Recent Labs  Lab 02/18/19 0513  NA 135  K 3.9  CL 102  CO2 27  GLUCOSE 120*  BUN 14  CREATININE 0.63  CALCIUM 7.8*  MG 2.0  AST 22  ALT 8  ALKPHOS 37  BILITOT 1.2    Microbiology Results  Results for orders placed or performed during the hospital encounter of 02/15/19  Culture, blood (Routine x 2)     Status: None   Collection Time: 02/15/19  2:53 PM  Result Value Ref Range Status   Specimen Description BLOOD BLOOD RIGHT FOREARM  Final   Special Requests   Final    BOTTLES DRAWN AEROBIC AND ANAEROBIC Blood Culture results may not be optimal due to an excessive volume of blood received in culture bottles   Culture   Final    NO GROWTH 5 DAYS Performed at [facility], [address] [place], [place] [address]    Report Status 02/20/2019 FINAL  Final  Culture, blood (Routine x 2)     Status: None   Collection Time: 02/15/19  2:53 PM  Result Value Ref Range Status   Specimen Description BLOOD BLOOD LEFT FOREARM  Final   Special Requests   Final    BOTTLES DRAWN AEROBIC AND ANAEROBIC Blood Culture results may not be optimal due to an excessive volume of blood received in culture bottles   Culture   Final    NO GROWTH 5 DAYS Performed at [facility], [address],  [place], [place] [address]    Report Status 02/20/2019 FINAL  Final  MRSA PCR Screening     Status: None  Collection Time: 02/15/19  9:07 PM  Result Value Ref Range Status   MRSA by PCR NEGATIVE NEGATIVE Final    Comment:        The GeneXpert MRSA Assay (FDA approved for NASAL specimens only), is one component of a comprehensive MRSA colonization surveillance program. It is not intended to diagnose MRSA infection nor to guide or monitor treatment for MRSA infections. Performed at [facility], [address], [place], [place] [address]      Management plans discussed with the patient, family and they are in agreement.  CODE STATUS:     Code Status Orders  (From admission, onward)         Start     Ordered   02/19/19 1456  Do not attempt resuscitation (DNR)  Continuous    Question Answer Comment  In the event of cardiac or respiratory ARREST Do not call a "code blue"   In the event of cardiac or respiratory ARREST Do not perform Intubation, CPR, defibrillation or ACLS   In the event of cardiac or respiratory ARREST Use medication by any route, position, wound care, and other measures to relive pain and suffering. May use oxygen, suction and manual treatment of airway obstruction as needed for comfort.      02/19/19 1455        Code Status History    Date Active Date Inactive Code Status Order ID Comments User Context   02/15/2019 2104 02/19/2019 1455 Full Code [id number]  Sela Hua, MD Inpatient   11/27/2018 1042 11/28/2018 1939 Full Code [id number]  Gladstone Lighter, MD ED   11/18/2017 1245 11/22/2017 2031 DNR [id number]  Hillary Bow, MD Inpatient   11/18/2017 0854 11/18/2017 1245 Full Code [id number]  Hillary Bow, MD ED   07/21/2017 1308 07/28/2017 1900 DNR [id number]  Max Sane, MD Inpatient   07/20/2017 0058 07/21/2017 1308 Full Code [id number]  Lance Coon, MD Inpatient   06/24/2017 2235 06/30/2017 0312 Full Code [id number]  Fritzi Mandes, MD Inpatient       TOTAL TIME TAKING CARE OF THIS PATIENT: 35 minutes.    Loletha Grayer M.D on 02/23/2019 at 11:28 AM  Between 7am to 6pm - Pager - [phone]  After 6pm go to [email or web address] - password [email or web address]  [facility] Office  [phone]  CC: Primary care physician; Rusty Aus, MD

## 2019-02-23 NOTE — Progress Notes (Signed)
SATURATION QUALIFICATIONS: (This note is used to comply with regulatory documentation for home oxygen)  Patient Saturations on Room Air at Rest = 96  Patient Saturations on Room Air while Ambulating = [id number]  Care management is following this case.  MD was also notified.   Phillis Knack, RN

## 2019-02-23 NOTE — Progress Notes (Signed)
   02/23/19 1840  Clinical Encounter Type  Visited With Patient and family together  Visit Type Follow-up (order for prayer)  Referral From Nurse  Byng provided active and reflective listening as patient processed feelings of loneliness being in the hospital extended time.  Patient's daughter arrived during visit.  Chaplain engaged with patient and daughter around his desire to return to his living situation.  They expressed gratitude for the support system he has.  Patient spoke of his cat whom he misses.  Chaplain prayed with patient and daughter.  Encouraged them to reach out as needed.

## 2019-02-23 NOTE — Progress Notes (Signed)
Patient ID: [name], male   DOB: May 07, 1941, 78 y.o.   MRN: [id number]  [employer] PROGRESS NOTE  Ricardo Erickson HWE:[id number] DOB: 1941-05-06 DOA: 02/15/2019 PCP: Ricardo Aus, MD  HPI/Subjective: Patient feeling better.  Still with a little cough and shortness of breath.  No palpitations.  Anxiety better.  Objective: Vitals:   02/23/19 0755 02/23/19 0830  BP:  114/87  Pulse:  72  Resp:  20  Temp:  (!) 97.5 F (36.4 C)  SpO2: 94% 93%    Filed Weights   02/15/19 1413 02/20/19 0551 02/21/19 0305  Weight: 95.3 kg 85.5 kg 84.5 kg    ROS: Review of Systems  Constitutional: Negative for chills and fever.  Eyes: Negative for blurred vision.  Respiratory: Positive for cough and shortness of breath.   Cardiovascular: Negative for chest pain and palpitations.  Gastrointestinal: Negative for abdominal pain, constipation, diarrhea, nausea and vomiting.  Genitourinary: Negative for dysuria.  Musculoskeletal: Negative for joint pain.  Neurological: Negative for dizziness and headaches.   Exam: Physical Exam  HENT:  Nose: No mucosal edema.  Mouth/Throat: No oropharyngeal exudate or posterior oropharyngeal edema.  Eyes: Pupils are equal, round, and reactive to light. Conjunctivae and lids are normal.  Neck: No JVD present. Carotid bruit is not present. No edema present. No thyroid mass and no thyromegaly present.  Cardiovascular: S1 normal and S2 normal. An irregularly irregular rhythm present. Exam reveals no gallop.  No murmur heard. Pulses:      Dorsalis pedis pulses are 2+ on the right side and 2+ on the left side.  Respiratory: No respiratory distress. He has decreased breath sounds in the left middle field and the left lower field. He has wheezes in the left middle field and the left lower field. He has no rhonchi. He has no rales.  GI: Soft. Bowel sounds are normal. There is no abdominal tenderness.  Musculoskeletal:     Right ankle: He exhibits swelling.   Left ankle: He exhibits swelling.  Lymphadenopathy:    He has no cervical adenopathy.  Neurological: He is alert. No cranial nerve deficit.  Skin: Skin is warm. No rash noted. Nails show no clubbing.  Psychiatric: His mood appears anxious.      Data Reviewed: Basic Metabolic Panel: Recent Labs  Lab 02/18/19 0513  NA 135  K 3.9  CL 102  CO2 27  GLUCOSE 120*  BUN 14  CREATININE 0.63  CALCIUM 7.8*  MG 2.0  PHOS 3.2   Liver Function Tests: Recent Labs  Lab 02/18/19 0513  AST 22  ALT 8  ALKPHOS 55  BILITOT 1.2  PROT 5.5*  ALBUMIN 2.9*   CBC: Recent Labs  Lab 02/17/19 0533 02/18/19 0513  WBC 10.8* 9.9  HGB 15.1 15.2  HCT 47.1 47.3  MCV 92.0 90.8  PLT 164 171   Cardiac Enzymes: No results for input(s): CKTOTAL, CKMB, CKMBINDEX, TROPONINI in the last 168 hours. BNP (last 3 results) Recent Labs    07/03/18 0103 08/22/18 1810 02/15/19 1419  BNP 102.0* 115.0* 180.0*      Recent Results (from the past 240 hour(s))  Culture, blood (Routine x 2)     Status: None   Collection Time: 02/15/19  2:53 PM  Result Value Ref Range Status   Specimen Description BLOOD BLOOD RIGHT FOREARM  Final   Special Requests   Final    BOTTLES DRAWN AEROBIC AND ANAEROBIC Blood Culture results may not be optimal due to an excessive volume  of blood received in culture bottles   Culture   Final    NO GROWTH 5 DAYS Performed at [facility], [address] [place], [place] [address]    Report Status 02/20/2019 FINAL  Final  Culture, blood (Routine x 2)     Status: None   Collection Time: 02/15/19  2:53 PM  Result Value Ref Range Status   Specimen Description BLOOD BLOOD LEFT FOREARM  Final   Special Requests   Final    BOTTLES DRAWN AEROBIC AND ANAEROBIC Blood Culture results may not be optimal due to an excessive volume of blood received in culture bottles   Culture   Final    NO GROWTH 5 DAYS Performed at [facility], [address] [place],  [place] [address]    Report Status 02/20/2019 FINAL  Final  MRSA PCR Screening     Status: None   Collection Time: 02/15/19  9:07 PM  Result Value Ref Range Status   MRSA by PCR NEGATIVE NEGATIVE Final    Comment:        The GeneXpert MRSA Assay (FDA approved for NASAL specimens only), is one component of a comprehensive MRSA colonization surveillance program. It is not intended to diagnose MRSA infection nor to guide or monitor treatment for MRSA infections. Performed at [facility], [address] [place], [place] [address]     Scheduled Meds: . amiodarone  400 mg Oral Daily  . apixaban  5 mg Oral BID  . budesonide (PULMICORT) nebulizer solution  0.25 mg Nebulization BID  . chlorhexidine  15 mL Mouth Rinse BID  . finasteride  5 mg Oral Daily  . FLUoxetine  10 mg Oral Daily  . furosemide  40 mg Oral BID  . ipratropium-albuterol  3 mL Nebulization Q6H  . mouth rinse  15 mL Mouth Rinse q12n4p  . memantine  5 mg Oral BID  . metoprolol tartrate  50 mg Oral BID  . nicotine  14 mg Transdermal Daily  . OLANZapine  1.25 mg Oral QHS  . pramipexole  1 mg Oral QHS  . tamsulosin  0.8 mg Oral QPC supper   Continuous Infusions:  Assessment/Plan:  1. Atrial fibrillation with rapid ventricular response.  Heart rate better today at rest and with ambulation.  Started amiodarone yesterday.  Decrease dose of metoprolol today. 2. Acute hypoxic respiratory failure secondary to influenza A infection.  Finished Tamiflu.  Patient's pulse ox with ambulation 83%.  Spoke with son and would rather give another day to see if we can get him off the oxygen. 3. Acute metabolic encephalopathy with inpatient delirium.  This has improved.  Mental status back to normal history of dementia.  Get rid of Ativan.  Continue Zyprexa. 4. Multiple thoracic lumbar compression fractures seen on CT.  PRN Tylenol and tramadol 5. Hypertension.  Continue metoprolol 6. COPD.  Continue inhalers 7. History of diastolic  congestive heart failure 8. 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 Anxiety on Prozac 10. Dementia on Zyprexa and Namenda 11. BPH on Proscar and Flomax 12. Constipation.  Patient had a bowel movement  Code Status:     Code Status Orders  (From admission, onward)         Start     Ordered   02/19/19 1456  Do not attempt resuscitation (DNR)  Continuous    Question Answer Comment  In the event of cardiac or respiratory ARREST Do not call a "code  blue"   In the event of cardiac or respiratory ARREST Do not perform Intubation, CPR, defibrillation or ACLS   In the event of cardiac or respiratory ARREST Use medication by any route, position, wound care, and other measures to relive pain and suffering. May use oxygen, suction and manual treatment of airway obstruction as needed for comfort.      02/19/19 1455        Code Status History    Date Active Date Inactive Code Status Order ID Comments User Context   02/15/2019 2104 02/19/2019 1455 Full Code [id number]  Sela Hua, MD Inpatient   11/27/2018 1042 11/28/2018 1939 Full Code [id number]  Gladstone Lighter, MD ED   11/18/2017 1245 11/22/2017 2031 DNR [id number]  Hillary Bow, MD Inpatient   11/18/2017 0854 11/18/2017 1245 Full Code [id number]  Hillary Bow, MD ED   07/21/2017 1308 07/28/2017 1900 DNR [id number]  Max Sane, MD Inpatient   07/20/2017 0058 07/21/2017 1308 Full Code [id number]  Lance Coon, MD Inpatient   06/24/2017 2235 06/30/2017 0312 Full Code [id number]  Fritzi Mandes, MD Inpatient     Family Communication: Spoke with son at the bedside this morning and on the phone this afternoon Disposition Plan: Try to get off oxygen.  Likely discharge tomorrow.  Time spent: 85minutes  [place]

## 2019-02-24 LAB — CBC
HCT: 48.2 % (ref 39.0–52.0)
Hemoglobin: 15.6 g/dL (ref 13.0–17.0)
MCH: 29.5 pg (ref 26.0–34.0)
MCHC: 32.4 g/dL (ref 30.0–36.0)
MCV: 91.1 fL (ref 80.0–100.0)
Platelets: 302 10*3/uL (ref 150–400)
RBC: 5.29 MIL/uL (ref 4.22–5.81)
RDW: 14.2 % (ref 11.5–15.5)
WBC: 12.4 10*3/uL — ABNORMAL HIGH (ref 4.0–10.5)
nRBC: 0 % (ref 0.0–0.2)

## 2019-02-24 NOTE — Progress Notes (Signed)
Patient ID: Ricardo Erickson, male   DOB: [birth year], 78 y.o.   MRN: [id number]  [facility] PROGRESS NOTE  Ricardo Erickson QQI:[id number] DOB: 01-08-1941 DOA: 02/15/2019 PCP: Rusty Aus, MD  HPI/Subjective: Patient feeling better.  I witnessed him go around the nursing station without a problem.  Told him to walk a little straighter upright.  Patient held his saturations with walking.  Objective: Vitals:   02/24/19 0735 02/24/19 0955  BP: 127/90   Pulse: 90   Resp:    Temp: 97.7 F (36.5 C)   SpO2: 93% 95%    Filed Weights   02/20/19 0551 02/21/19 0305 02/24/19 0521  Weight: 85.5 kg 84.5 kg 82.9 kg    ROS: Review of Systems  Constitutional: Negative for chills and fever.  Eyes: Negative for blurred vision.  Respiratory: Positive for cough and shortness of breath.   Cardiovascular: Negative for chest pain and palpitations.  Gastrointestinal: Negative for abdominal pain, constipation, diarrhea, nausea and vomiting.  Genitourinary: Negative for dysuria.  Musculoskeletal: Negative for joint pain.  Neurological: Negative for dizziness and headaches.   Exam: Physical Exam  HENT:  Nose: No mucosal edema.  Mouth/Throat: No oropharyngeal exudate or posterior oropharyngeal edema.  Eyes: Pupils are equal, round, and reactive to light. Conjunctivae and lids are normal.  Neck: No JVD present. Carotid bruit is not present. No edema present. No thyroid mass and no thyromegaly present.  Cardiovascular: S1 normal and S2 normal. An irregularly irregular rhythm present. Exam reveals no gallop.  No murmur heard. Pulses:      Dorsalis pedis pulses are 2+ on the right side and 2+ on the left side.  Respiratory: No respiratory distress. He has decreased breath sounds in the left middle field and the left lower field. He has wheezes in the left middle field and the left lower field. He has no rhonchi. He has no rales.  GI: Soft. Bowel sounds are normal. There is no abdominal tenderness.   Musculoskeletal:     Right ankle: He exhibits swelling.     Left ankle: He exhibits swelling.  Lymphadenopathy:    He has no cervical adenopathy.  Neurological: He is alert. No cranial nerve deficit.  Skin: Skin is warm. No rash noted. Nails show no clubbing.  Psychiatric: His mood appears anxious.      Data Reviewed: Basic Metabolic Panel: Recent Labs  Lab 02/18/19 0513  NA 135  K 3.9  CL 102  CO2 27  GLUCOSE 120*  BUN 14  CREATININE 0.63  CALCIUM 7.8*  MG 2.0  PHOS 3.2   Liver Function Tests: Recent Labs  Lab 02/18/19 0513  AST 22  ALT 8  ALKPHOS 55  BILITOT 1.2  PROT 5.5*  ALBUMIN 2.9*   CBC: Recent Labs  Lab 02/18/19 0513 02/24/19 0544  WBC 9.9 12.4*  HGB 15.2 15.6  HCT 47.3 48.2  MCV 90.8 91.1  PLT 171 302   Cardiac Enzymes: No results for input(s): CKTOTAL, CKMB, CKMBINDEX, TROPONINI in the last 168 hours. BNP (last 3 results) Recent Labs    07/03/18 0103 08/22/18 1810 02/15/19 1419  BNP 102.0* 115.0* 180.0*      Recent Results (from the past 240 hour(s))  Culture, blood (Routine x 2)     Status: None   Collection Time: 02/15/19  2:53 PM  Result Value Ref Range Status   Specimen Description BLOOD BLOOD RIGHT FOREARM  Final   Special Requests   Final    BOTTLES DRAWN AEROBIC  AND ANAEROBIC Blood Culture results may not be optimal due to an excessive volume of blood received in culture bottles   Culture   Final    NO GROWTH 5 DAYS Performed at [facility], [address] [place], [place] [address]    Report Status 02/20/2019 FINAL  Final  Culture, blood (Routine x 2)     Status: None   Collection Time: 02/15/19  2:53 PM  Result Value Ref Range Status   Specimen Description BLOOD BLOOD LEFT FOREARM  Final   Special Requests   Final    BOTTLES DRAWN AEROBIC AND ANAEROBIC Blood Culture results may not be optimal due to an excessive volume of blood received in culture bottles   Culture   Final    NO GROWTH 5  DAYS Performed at [facility], [address] [place], [place] [address]    Report Status 02/20/2019 FINAL  Final  MRSA PCR Screening     Status: None   Collection Time: 02/15/19  9:07 PM  Result Value Ref Range Status   MRSA by PCR NEGATIVE NEGATIVE Final    Comment:        The GeneXpert MRSA Assay (FDA approved for NASAL specimens only), is one component of a comprehensive MRSA colonization surveillance program. It is not intended to diagnose MRSA infection nor to guide or monitor treatment for MRSA infections. Performed at [facility], [address] [place], [place] [address]     Scheduled Meds: . amiodarone  400 mg Oral Daily  . apixaban  5 mg Oral BID  . budesonide (PULMICORT) nebulizer solution  0.25 mg Nebulization BID  . chlorhexidine  15 mL Mouth Rinse BID  . finasteride  5 mg Oral Daily  . FLUoxetine  10 mg Oral Daily  . furosemide  40 mg Oral BID  . ipratropium-albuterol  3 mL Nebulization Q6H  . mouth rinse  15 mL Mouth Rinse q12n4p  . memantine  5 mg Oral BID  . metoprolol tartrate  50 mg Oral BID  . nicotine  14 mg Transdermal Daily  . OLANZapine  1.25 mg Oral QHS  . pramipexole  1 mg Oral QHS  . tamsulosin  0.8 mg Oral QPC supper   Continuous Infusions:  Assessment/Plan:  1. Atrial fibrillation with rapid ventricular response.  Heart rate better today at rest and with ambulation.  Continue amiodarone.  Decrease dose of metoprolol to 50 mg twice daily. 2. Acute hypoxic respiratory failure secondary to influenza A infection.  Finished Tamiflu.  Patient's pulse ox with with ambulation was above 93%.  No need for oxygen at home. 3. Acute metabolic encephalopathy with inpatient delirium.  This has improved.  continue Zyprexa. 4. Multiple thoracic lumbar compression fractures seen on CT.  PRN Tylenol and tramadol 5. Hypertension.  Continue metoprolol 6. COPD.  Continue inhalers 7. History of diastolic congestive heart  failure 8. 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 Anxiety on Prozac 10. Dementia on Zyprexa and Namenda 11. BPH on Proscar and Flomax 12. Constipation.  Patient had a bowel movement  Code Status:     Code Status Orders  (From admission, onward)         Start     Ordered   02/19/19 1456  Do not attempt resuscitation (DNR)  Continuous    Question Answer Comment  In the event of cardiac or respiratory ARREST Do not call a "code blue"   In the event of  cardiac or respiratory ARREST Do not perform Intubation, CPR, defibrillation or ACLS   In the event of cardiac or respiratory ARREST Use medication by any route, position, wound care, and other measures to relive pain and suffering. May use oxygen, suction and manual treatment of airway obstruction as needed for comfort.      02/19/19 1455        Code Status History    Date Active Date Inactive Code Status Order ID Comments User Context   02/15/2019 2104 02/19/2019 1455 Full Code [id number]  Sela Hua, MD Inpatient   11/27/2018 1042 11/28/2018 1939 Full Code [id number]  Gladstone Lighter, MD ED   11/18/2017 1245 11/22/2017 2031 DNR [id number]  Hillary Bow, MD Inpatient   11/18/2017 0854 11/18/2017 1245 Full Code [id number]  Hillary Bow, MD ED   07/21/2017 1308 07/28/2017 1900 DNR [id number]  Max Sane, MD Inpatient   07/20/2017 0058 07/21/2017 1308 Full Code [id number]  Lance Coon, MD Inpatient   06/24/2017 2235 06/30/2017 0312 Full Code [id number]  Fritzi Mandes, MD Inpatient     Family Communication: Spoke with son on the phone Disposition Plan: Discharge today  Time spent: 32 minutes  [place]

## 2019-02-24 NOTE — Progress Notes (Signed)
Patient is alert and oriented and able to verbalize needs. No complaints of pain at this time. VSS. PIV removed. Tele removed. Printed AVS given to patient's son in discharge packet. All instructions gone over at this time. No concerns voiced. All belongings packed. NT escorted pt to car via wc.  Bethann Punches, RN

## 2019-02-24 NOTE — TOC Transition Note (Signed)
Transition of Care [facility]) - CM/SW Discharge Note   Patient Details  Name: Ricardo Erickson MRN: [id number] Date of Birth: 05/08/1941  Transition of Care [facility]) CM/SW Contact:  Latanya Maudlin, RN Phone Number: 02/24/2019, 10:48 AM   Clinical Narrative:   Patient to be discharged per MD order. Orders in place for home health services. Patient was previously worked up for home health via [place]. Notified Helene Kelp of pending discharge. Patient was successfully weaned from O2. No DME needs. Son to transport.      Final next level of care: [place] Barriers to Discharge: No Barriers Identified   Patient Goals and CMS Choice Patient states their goals for this hospitalization and ongoing recovery are:: "to return to [facility] with [facility] at home and all the services needed and covered under his insurance plan".  CMS Medicare.gov Compare Post Acute Care list provided to:: Patient Choice offered to / list presented to : Patient  Discharge Placement                       Discharge Plan and Services Discharge Planning Services: CM Consult Post Acute Care Choice: Home Health              HH Arranged: RN, PT, OT, Nurse's Aide, Social Work [employer] Agency: [facility] [employer] [facility] [employer]   Social Determinants of Health ([place]) Interventions     Readmission Risk Interventions No flowsheet data found.

## 2019-02-26 ENCOUNTER — Ambulatory Visit: Payer: Self-pay | Admitting: Urology

## 2019-03-04 DIAGNOSIS — I110 Hypertensive heart disease with heart failure: Secondary | ICD-10-CM | POA: Diagnosis not present

## 2019-03-04 DIAGNOSIS — I4891 Unspecified atrial fibrillation: Secondary | ICD-10-CM | POA: Diagnosis not present

## 2019-03-04 DIAGNOSIS — I5030 Unspecified diastolic (congestive) heart failure: Secondary | ICD-10-CM | POA: Diagnosis not present

## 2019-03-04 DIAGNOSIS — J439 Emphysema, unspecified: Secondary | ICD-10-CM | POA: Diagnosis not present

## 2019-04-12 ENCOUNTER — Emergency Department: Payer: Medicare Other

## 2019-04-12 ENCOUNTER — Inpatient Hospital Stay
Admission: EM | Admit: 2019-04-12 | Discharge: 2019-04-17 | DRG: 291 | Disposition: A | Discharge status: Skilled Nursing Facility | Payer: Medicare Other | Attending: Specialist | Admitting: Specialist

## 2019-04-12 ENCOUNTER — Other Ambulatory Visit: Payer: Self-pay

## 2019-04-12 DIAGNOSIS — E876 Hypokalemia: Secondary | ICD-10-CM | POA: Diagnosis not present

## 2019-04-12 DIAGNOSIS — Z79891 Long term (current) use of opiate analgesic: Secondary | ICD-10-CM

## 2019-04-12 DIAGNOSIS — G2581 Restless legs syndrome: Secondary | ICD-10-CM | POA: Diagnosis present

## 2019-04-12 DIAGNOSIS — M8448XA Pathological fracture, other site, initial encounter for fracture: Secondary | ICD-10-CM | POA: Diagnosis present

## 2019-04-12 DIAGNOSIS — F4310 Post-traumatic stress disorder, unspecified: Secondary | ICD-10-CM | POA: Diagnosis present

## 2019-04-12 DIAGNOSIS — I110 Hypertensive heart disease with heart failure: Principal | ICD-10-CM | POA: Diagnosis present

## 2019-04-12 DIAGNOSIS — G8929 Other chronic pain: Secondary | ICD-10-CM | POA: Diagnosis present

## 2019-04-12 DIAGNOSIS — Z8249 Family history of ischemic heart disease and other diseases of the circulatory system: Secondary | ICD-10-CM | POA: Diagnosis not present

## 2019-04-12 DIAGNOSIS — I5033 Acute on chronic diastolic (congestive) heart failure: Secondary | ICD-10-CM | POA: Diagnosis present

## 2019-04-12 DIAGNOSIS — Z7901 Long term (current) use of anticoagulants: Secondary | ICD-10-CM

## 2019-04-12 DIAGNOSIS — Z888 Allergy status to other drugs, medicaments and biological substances status: Secondary | ICD-10-CM

## 2019-04-12 DIAGNOSIS — R296 Repeated falls: Secondary | ICD-10-CM | POA: Diagnosis present

## 2019-04-12 DIAGNOSIS — R531 Weakness: Secondary | ICD-10-CM | POA: Diagnosis present

## 2019-04-12 DIAGNOSIS — Z20828 Contact with and (suspected) exposure to other viral communicable diseases: Secondary | ICD-10-CM | POA: Diagnosis present

## 2019-04-12 DIAGNOSIS — G9349 Other encephalopathy: Secondary | ICD-10-CM | POA: Diagnosis present

## 2019-04-12 DIAGNOSIS — T502X5A Adverse effect of carbonic-anhydrase inhibitors, benzothiadiazides and other diuretics, initial encounter: Secondary | ICD-10-CM | POA: Diagnosis not present

## 2019-04-12 DIAGNOSIS — F17210 Nicotine dependence, cigarettes, uncomplicated: Secondary | ICD-10-CM | POA: Diagnosis present

## 2019-04-12 DIAGNOSIS — R0602 Shortness of breath: Secondary | ICD-10-CM | POA: Diagnosis present

## 2019-04-12 DIAGNOSIS — W19XXXA Unspecified fall, initial encounter: Secondary | ICD-10-CM | POA: Diagnosis present

## 2019-04-12 DIAGNOSIS — N400 Enlarged prostate without lower urinary tract symptoms: Secondary | ICD-10-CM | POA: Diagnosis present

## 2019-04-12 DIAGNOSIS — I480 Paroxysmal atrial fibrillation: Secondary | ICD-10-CM | POA: Diagnosis present

## 2019-04-12 DIAGNOSIS — R4182 Altered mental status, unspecified: Secondary | ICD-10-CM

## 2019-04-12 DIAGNOSIS — F0390 Unspecified dementia without behavioral disturbance: Secondary | ICD-10-CM | POA: Diagnosis present

## 2019-04-12 DIAGNOSIS — J439 Emphysema, unspecified: Secondary | ICD-10-CM | POA: Diagnosis present

## 2019-04-12 DIAGNOSIS — Z86711 Personal history of pulmonary embolism: Secondary | ICD-10-CM

## 2019-04-12 DIAGNOSIS — Z79899 Other long term (current) drug therapy: Secondary | ICD-10-CM

## 2019-04-12 DIAGNOSIS — Z66 Do not resuscitate: Secondary | ICD-10-CM | POA: Diagnosis present

## 2019-04-12 DIAGNOSIS — Z7951 Long term (current) use of inhaled steroids: Secondary | ICD-10-CM

## 2019-04-12 DIAGNOSIS — J9601 Acute respiratory failure with hypoxia: Secondary | ICD-10-CM | POA: Diagnosis present

## 2019-04-12 DIAGNOSIS — I509 Heart failure, unspecified: Secondary | ICD-10-CM

## 2019-04-12 LAB — CBC
HCT: 48.7 % (ref 39.0–52.0)
Hemoglobin: 15.3 g/dL (ref 13.0–17.0)
MCH: 29.9 pg (ref 26.0–34.0)
MCHC: 31.4 g/dL (ref 30.0–36.0)
MCV: 95.3 fL (ref 80.0–100.0)
Platelets: 187 10*3/uL (ref 150–400)
RBC: 5.11 MIL/uL (ref 4.22–5.81)
RDW: 15.3 % (ref 11.5–15.5)
WBC: 14.1 10*3/uL — ABNORMAL HIGH (ref 4.0–10.5)
nRBC: 0 % (ref 0.0–0.2)

## 2019-04-12 LAB — COMPREHENSIVE METABOLIC PANEL
ALT: 7 U/L (ref 0–44)
AST: 20 U/L (ref 15–41)
Albumin: 3.5 g/dL (ref 3.5–5.0)
Alkaline Phosphatase: 86 U/L (ref 38–126)
Anion gap: 9 (ref 5–15)
BUN: 13 mg/dL (ref 8–23)
CO2: 26 mmol/L (ref 22–32)
Calcium: 8.6 mg/dL — ABNORMAL LOW (ref 8.9–10.3)
Chloride: 103 mmol/L (ref 98–111)
Creatinine, Ser: 0.81 mg/dL (ref 0.61–1.24)
GFR calc Af Amer: >60 mL/min (ref >60)
GFR calc non Af Amer: >60 mL/min (ref >60)
Glucose, Bld: 93 mg/dL (ref 70–99)
Potassium: 4.1 mmol/L (ref 3.5–5.1)
Sodium: 138 mmol/L (ref 135–145)
Total Bilirubin: 1.6 mg/dL — ABNORMAL HIGH (ref 0.3–1.2)
Total Protein: 6.7 g/dL (ref 6.5–8.1)

## 2019-04-12 LAB — URINALYSIS, COMPLETE (UACMP) WITH MICROSCOPIC
Bacteria, UA: NONE SEEN
Bilirubin Urine: NEGATIVE
Glucose, UA: NEGATIVE mg/dL
Ketones, ur: NEGATIVE mg/dL
Leukocytes,Ua: NEGATIVE
Nitrite: NEGATIVE
Protein, ur: NEGATIVE mg/dL
Specific Gravity, Urine: 1.012 (ref 1.005–1.030)
Squamous Epithelial / HPF: NONE SEEN (ref 0–5)
pH: 5 (ref 5.0–8.0)

## 2019-04-12 LAB — TROPONIN I: Troponin I: <0.03 ng/mL (ref <0.03)

## 2019-04-12 MED ORDER — METOPROLOL TARTRATE 50 MG PO TABS
50.0000 mg | ORAL_TABLET | Freq: Two times a day (BID) | ORAL | Status: DC
  Administered 2019-04-12 – 2019-04-17 (×8): 50 mg via ORAL
  Filled 2019-04-12 (×10): qty 1

## 2019-04-12 MED ORDER — ACETAMINOPHEN 325 MG PO TABS
650.0000 mg | ORAL_TABLET | ORAL | Status: DC | PRN
  Administered 2019-04-16: 650 mg via ORAL
  Filled 2019-04-12: qty 2

## 2019-04-12 MED ORDER — TIOTROPIUM BROMIDE MONOHYDRATE 18 MCG IN CAPS
18.0000 ug | ORAL_CAPSULE | Freq: Every day | RESPIRATORY_TRACT | Status: DC
  Administered 2019-04-12 – 2019-04-17 (×6): 18 ug via RESPIRATORY_TRACT
  Filled 2019-04-12 (×2): qty 5

## 2019-04-12 MED ORDER — POTASSIUM CHLORIDE CRYS ER 10 MEQ PO TBCR
10.0000 meq | EXTENDED_RELEASE_TABLET | Freq: Every day | ORAL | Status: DC
  Administered 2019-04-13 – 2019-04-17 (×5): 10 meq via ORAL
  Filled 2019-04-12 (×5): qty 1

## 2019-04-12 MED ORDER — FUROSEMIDE 10 MG/ML IJ SOLN: 20.0000 mg | Freq: Two times a day (BID) | INTRAMUSCULAR | Status: DC

## 2019-04-12 MED ORDER — LIDOCAINE 5 % EX PTCH
1.0000 | MEDICATED_PATCH | CUTANEOUS | Status: DC
  Administered 2019-04-12 – 2019-04-16 (×5): 1 via TRANSDERMAL
  Filled 2019-04-12 (×6): qty 1

## 2019-04-12 MED ORDER — MOMETASONE FURO-FORMOTEROL FUM 200-5 MCG/ACT IN AERO
2.0000 | INHALATION_SPRAY | Freq: Two times a day (BID) | RESPIRATORY_TRACT | Status: DC
  Administered 2019-04-12 – 2019-04-17 (×10): 2 via RESPIRATORY_TRACT
  Filled 2019-04-12: qty 8.8

## 2019-04-12 MED ORDER — ALBUTEROL SULFATE (2.5 MG/3ML) 0.083% IN NEBU
2.5000 mg | INHALATION_SOLUTION | Freq: Four times a day (QID) | RESPIRATORY_TRACT | Status: DC
  Administered 2019-04-12 – 2019-04-13 (×2): 2.5 mg via RESPIRATORY_TRACT
  Filled 2019-04-12 (×2): qty 3

## 2019-04-12 MED ORDER — APIXABAN 5 MG PO TABS
5.0000 mg | ORAL_TABLET | Freq: Two times a day (BID) | ORAL | Status: DC
  Administered 2019-04-12 – 2019-04-17 (×10): 5 mg via ORAL
  Filled 2019-04-12 (×10): qty 1

## 2019-04-12 MED ORDER — SODIUM CHLORIDE 0.9 % IV BOLUS
1000.0000 mL | Freq: Once | INTRAVENOUS | Status: AC
  Administered 2019-04-12: 1000 mL via INTRAVENOUS

## 2019-04-12 MED ORDER — ONDANSETRON HCL 4 MG/2ML IJ SOLN: 4.0000 mg | Freq: Four times a day (QID) | INTRAMUSCULAR | Status: DC | PRN

## 2019-04-12 MED ORDER — PRAMIPEXOLE DIHYDROCHLORIDE 0.25 MG PO TABS
1.0000 mg | ORAL_TABLET | Freq: Every day | ORAL | Status: DC
  Administered 2019-04-12 – 2019-04-16 (×5): 1 mg via ORAL
  Filled 2019-04-12 (×5): qty 4

## 2019-04-12 MED ORDER — TAMSULOSIN HCL 0.4 MG PO CAPS
0.4000 mg | ORAL_CAPSULE | Freq: Every day | ORAL | Status: DC
  Administered 2019-04-12 – 2019-04-17 (×5): 0.4 mg via ORAL
  Filled 2019-04-12 (×6): qty 1

## 2019-04-12 MED ORDER — FUROSEMIDE 10 MG/ML IJ SOLN
20.0000 mg | Freq: Once | INTRAMUSCULAR | Status: AC
  Administered 2019-04-12: 19:00:00 20 mg via INTRAVENOUS
  Filled 2019-04-12: qty 4

## 2019-04-12 MED ORDER — DOCUSATE SODIUM 100 MG PO CAPS
100.0000 mg | ORAL_CAPSULE | Freq: Two times a day (BID) | ORAL | Status: DC
  Administered 2019-04-12 – 2019-04-17 (×9): 100 mg via ORAL
  Filled 2019-04-12 (×10): qty 1

## 2019-04-12 MED ORDER — OLANZAPINE 2.5 MG PO TABS
1.2500 mg | ORAL_TABLET | Freq: Every day | ORAL | Status: DC
  Administered 2019-04-12 – 2019-04-13 (×2): 1.25 mg via ORAL
  Filled 2019-04-12 (×2): qty 0.5

## 2019-04-12 MED ORDER — AMIODARONE HCL 200 MG PO TABS
200.0000 mg | ORAL_TABLET | Freq: Every day | ORAL | Status: DC
  Administered 2019-04-13 – 2019-04-17 (×5): 200 mg via ORAL
  Filled 2019-04-12 (×5): qty 1

## 2019-04-12 MED ORDER — SODIUM CHLORIDE 0.9% FLUSH
3.0000 mL | Freq: Two times a day (BID) | INTRAVENOUS | Status: DC
  Administered 2019-04-12 – 2019-04-14 (×4): 3 mL via INTRAVENOUS
  Administered 2019-04-15: 22:00:00 via INTRAVENOUS
  Administered 2019-04-17: 3 mL via INTRAVENOUS

## 2019-04-12 MED ORDER — TRAMADOL HCL 50 MG PO TABS
50.0000 mg | ORAL_TABLET | Freq: Two times a day (BID) | ORAL | Status: DC
  Administered 2019-04-12 – 2019-04-17 (×9): 50 mg via ORAL
  Filled 2019-04-12 (×10): qty 1

## 2019-04-12 MED ORDER — FLUOXETINE HCL 10 MG PO CAPS
10.0000 mg | ORAL_CAPSULE | Freq: Every day | ORAL | Status: DC
  Administered 2019-04-12 – 2019-04-17 (×6): 10 mg via ORAL
  Filled 2019-04-12 (×6): qty 1

## 2019-04-12 MED ORDER — ALBUTEROL SULFATE (2.5 MG/3ML) 0.083% IN NEBU: 2.5000 mg | INHALATION_SOLUTION | RESPIRATORY_TRACT | Status: DC | PRN

## 2019-04-12 MED ORDER — FINASTERIDE 5 MG PO TABS
5.0000 mg | ORAL_TABLET | Freq: Every day | ORAL | Status: DC
  Administered 2019-04-13 – 2019-04-17 (×5): 5 mg via ORAL
  Filled 2019-04-12 (×5): qty 1

## 2019-04-12 NOTE — ED Notes (Signed)
Pt placed on 2L, sats at 92%

## 2019-04-12 NOTE — ED Notes (Signed)
Son [place]: [phone]

## 2019-04-12 NOTE — ED Provider Notes (Signed)
[facility] Emergency Department Provider Note  Time seen: 12:07 PM  I have reviewed the triage vital signs and the nursing notes.   HISTORY  Chief Complaint Altered Mental Status and Shortness of Breath   HPI Ricardo Erickson is a 78 y.o. male with a past medical history of CHF, dementia, hypertension, PTSD, encephalopathy, prior PE on Eliquis, presents to the emergency department for weakness and altered mental status.   According to EMS report patient comes from [facility] home with some difficulty or increased work of breathing, family also states this morning patient seemed more confused and patient states he feels more weak than normal.  Patient does not wear oxygen at baseline.  He does have a history of CHF.  Denies any known fever.  Denies any cough.  Largely negative review of systems otherwise per patient.  Past Medical History:  Diagnosis Date  . CHF (congestive heart failure) ([place])   . Chronic back pain   . Dementia ([place])   . Depression   . Emphysema lung ([place])   . Hypertension   . Personal history of tobacco use, presenting hazards to health 08/06/2015  . Pollen allergies   . PTSD (post-traumatic stress disorder)   . Pulmonary embolism [facility])     Patient Active Problem List   Diagnosis Date Noted  . Influenza A 02/15/2019  . Pneumonia 11/27/2018  . Pulmonary emboli ([place]) 11/18/2017  . Tobacco use disorder 07/24/2017  . Encephalopathy   . Palliative care encounter   . Goals of care, counseling/discussion   . Pulmonary embolus ([place])   . UTI (urinary tract infection) 07/19/2017  . PTSD (post-traumatic stress disorder) 06/28/2017  . Adjustment disorder with anxiety 06/28/2017  . Closed compression fracture of L5 lumbar vertebra   . Left low back pain   . Weakness   . A-fib ([place]) 06/24/2017  . Prediabetes 01/18/2017  . Prostate cancer ([place]) 01/18/2017  . Anxiety 01/18/2017  . Preventative health care 04/15/2016  . Restless leg  syndrome 04/03/2016  . COPD (chronic obstructive pulmonary disease) ([place]) 04/03/2016  . BPH (benign prostatic hyperplasia) 04/03/2016  . Personal history of tobacco use, presenting hazards to health 08/06/2015    Past Surgical History:  Procedure Laterality Date  . BACK SURGERY    . KYPHOPLASTY N/A 06/27/2017   Procedure: KYPHOPLASTY -L5;  Surgeon: Hessie Knows, MD;  Location: [facility] ORS;  Service: Orthopedics;  Laterality: N/A;  . [place]    Prior to Admission medications   Medication Sig Start Date End Date Taking? Authorizing Provider  acetaminophen (TYLENOL) 650 MG CR tablet Take 650 mg by mouth every 8 (eight) hours as needed for pain.    [provider]  ADVAIR DISKUS 250-50 MCG/DOSE AEPB Inhale 1 puff into the lungs every 12 (twelve) hours. 04/09/18   [provider]  albuterol (PROVENTIL HFA;VENTOLIN HFA) 108 (90 Base) MCG/ACT inhaler Inhale 2 puffs into the lungs every 4 (four) hours as needed for wheezing or shortness of breath.     [provider]  albuterol (PROVENTIL) (2.5 MG/3ML) 0.083% nebulizer solution Inhale 2.5 mg into the lungs 4 (four) times daily.  01/15/15   [provider]  amiodarone (PACERONE) 200 MG tablet Two tabs po daily for three more days then take one tablet daily afterwards 02/23/19   Loletha Grayer, MD  apixaban (ELIQUIS) 5 MG TABS tablet Take 1 tablet (5 mg total) by mouth 2 (two) times daily. 11/28/17   Gladstone Lighter,  MD  docusate sodium (COLACE) 100 MG capsule Take 1 capsule (100 mg total) by mouth 2 (two) times daily. 02/23/19 02/23/20  Loletha Grayer, MD  finasteride (PROSCAR) 5 MG tablet Take 1 tablet (5 mg total) by mouth daily. 01/21/19   Festus Aloe, MD  FLUoxetine (PROZAC) 10 MG capsule Take 10 mg by mouth daily.    [provider]  furosemide (LASIX) 20 MG tablet Take 20 mg by mouth every morning.  06/29/18   [provider]  lidocaine (LIDODERM) 5 % Place 1  patch onto the skin daily. Remove & Discard patch within 12 hours or as directed by MD 11/28/18   Gladstone Lighter, MD  memantine (NAMENDA) 5 MG tablet Take 1 tablet (5 mg total) by mouth 2 (two) times daily. 02/23/19   Loletha Grayer, MD  metoprolol tartrate (LOPRESSOR) 50 MG tablet Take 1 tablet (50 mg total) by mouth 2 (two) times daily. 02/23/19   Loletha Grayer, MD  nicotine (NICODERM CQ - DOSED IN MG/24 HOURS) 14 mg/24hr patch 14 mg patch transdermal daily (okay to substitute generic) 02/23/19   Leslye Peer, Richard, MD  OLANZapine (ZYPREXA) 2.5 MG tablet Take 1.25 mg by mouth at bedtime.    [provider]  potassium chloride (K-DUR) 10 MEQ tablet Take 10 mEq by mouth daily. 06/27/18 06/27/19  [provider]  pramipexole (MIRAPEX) 1 MG tablet Take 1 tablet (1 mg total) by mouth at bedtime. 11/22/17   Gladstone Lighter, MD  SPIRIVA HANDIHALER 18 MCG inhalation capsule Place 18 mcg into inhaler and inhale daily. 06/11/18   [provider]  tamsulosin (FLOMAX) 0.4 MG CAPS capsule Take 2 capsules (0.8 mg total) by mouth daily after supper. 02/02/18   Festus Aloe, MD  traMADol (ULTRAM) 50 MG tablet Take 1 tablet (50 mg total) by mouth 2 (two) times daily. 02/23/19   Loletha Grayer, MD    Allergies  Allergen Reactions  . Ativan [Lorazepam] Other (See Comments)    Patient gets severe altered mental status and confusion when taking this medicine    Family History  Problem Relation Age of Onset  . Alcohol abuse Father   . Arthritis Mother   . Heart disease Maternal Grandmother   . Prostate cancer Brother     Social History Social History   Tobacco Use  . Smoking status: Current Every Day Smoker    Packs/day: 0.50    Years: 60.00    Pack years: 30.00    Types: Cigarettes  . Smokeless tobacco: Never Used  Substance Use Topics  . Alcohol use: No    Alcohol/week: 0.0 standard drinks  . Drug use: No    Review of Systems Constitutional: Negative for  fever.  Positive for generalized weakness Cardiovascular: Negative for chest pain. Respiratory: Mild shortness of breath.  Negative for cough. Gastrointestinal: Negative for abdominal pain, vomiting and diarrhea. Genitourinary: Negative for urinary compaints Musculoskeletal: Negative for musculoskeletal complaints Skin: Negative for skin complaints  Neurological: Negative for headache All other ROS negative  ____________________________________________   PHYSICAL EXAM:  VITAL SIGNS: ED Triage Vitals  Enc Vitals Group     BP 04/12/19 1154 119/70     Pulse Rate 04/12/19 1155 (!) 52     Resp 04/12/19 1155 (!) 23     Temp 04/12/19 1155 98.3 F (36.8 C)     Temp Source 04/12/19 1155 Oral     SpO2 04/12/19 1155 99 %     Weight 04/12/19 1156 260 lb (117.9 kg)  Height 04/12/19 1156 5\' 8"  (1.727 m)     Head Circumference --      Peak Flow --      Pain Score 04/12/19 1156 7     Pain Loc --      Pain Edu? --      Excl. in [place]? --     Constitutional: Alert and oriented. Well appearing and in no distress. Eyes: Normal exam ENT      Head: Normocephalic and atraumatic.      Mouth/Throat: Mucous membranes are moist. Cardiovascular: Normal rate, regular rhythm.  Respiratory: Normal respiratory effort without tachypnea nor retractions. Breath sounds are clear Gastrointestinal: Soft and nontender. No distention.  Musculoskeletal: Nontender with normal range of motion in all extremities. Neurologic:  Normal speech and language. No gross focal neurologic deficits  Skin:  Skin is warm, dry and intact.  Psychiatric: Mood and affect are normal.   ____________________________________________    EKG  EKG viewed and interpreted by myself shows a normal sinus rhythm at 52 bpm with a widened QRS, normal axis, slight PR prolongation.  Otherwise normal intervals with nonspecific ST changes.  ____________________________________________    RADIOLOGY  MRI lumbar spine shows L4 endplate  fracture. CT lumbar spine shows L4 fracture.  Chronic fractures of L1-L2 L5. CT head negative. Chest x-ray shows slight pulmonary vascular congestion.  ____________________________________________   INITIAL IMPRESSION / ASSESSMENT AND PLAN / ED COURSE  Pertinent labs & imaging results that were available during my care of the patient were reviewed by me and considered in my medical decision making (see chart for details).   Patient presents to the emergency department from his nursing facility for generalized weakness, shortness of breath and some confusion this morning.  Differential would include CHF exacerbation, pneumonia, upper respiratory infection, metabolic or electrolyte abnormality, infectious etiologies such as pneumonia or UTI.  We will check labs, urinalysis, CT scan of the head, chest x-ray and continue to closely monitor.  CT and MRI of the back pending upon my signout however results have shown L4 endplate fracture.  Patient was ultimately admitted to the hospital for further work-up.  Ricardo Erickson was evaluated in Emergency Department on 04/12/2019 for the symptoms described in the history of present illness. He was evaluated in the context of the global COVID-19 pandemic, which necessitated consideration that the patient might be at risk for infection with the SARS-CoV-2 virus that causes COVID-19. Institutional protocols and algorithms that pertain to the evaluation of patients at risk for COVID-19 are in a state of rapid change based on information released by regulatory bodies including the CDC and federal and state organizations. These policies and algorithms were followed during the patient's care in the ED.  ____________________________________________   FINAL CLINICAL IMPRESSION(S) / ED DIAGNOSES  Shortness of breath Altered mental status/confusion    Harvest Dark, MD 04/14/19 1504

## 2019-04-12 NOTE — ED Notes (Signed)
Patient transported to CT 

## 2019-04-12 NOTE — ED Triage Notes (Signed)
Pt to ED via EMS from [facility], pt arrives working to breathe. Per ems pt fell this morning and family reports AMS, pt it a&o x4. Pt arrives belly breathing and tachypnic on 4l not on o2 doesn't normally wear o2. Pt has hx of CHF. Bilateral lower leg pitting edema. Pt states he feels weak.

## 2019-04-12 NOTE — ED Notes (Signed)
Patient transported to MRI 

## 2019-04-12 NOTE — ED Notes (Signed)
RN called to update POA.

## 2019-04-12 NOTE — H&P (Signed)
[facility] [place] NAME: Ricardo Erickson    MR#:  [id number]  DATE OF BIRTH:  Jun 30, 1941  DATE OF ADMISSION:  04/12/2019  PRIMARY CARE PHYSICIAN: Rusty Aus, MD   REQUESTING/REFERRING PHYSICIAN: Rudene Re, MD  CHIEF COMPLAINT:   Chief Complaint  Patient presents with   Altered Mental Status   Shortness of Breath    HISTORY OF PRESENT ILLNESS:  Ricardo Erickson  is a 78 y.o. male with a known history of pulmonary embolism, atrial fibrillation on Eliquis, CHF, chronic back pain from chronic compression fracture status post kyphoplasty, dementia, hypertension, and recurrent falls presenting to the ED from [facility] with chief complaints of shortness of breath and altered mental status.  Per EMS report patient fell this morning and was noted to be altered.  Patient is not a good historian due to underlying dementia therefore history mostly obtained from the ED charts.  On arrival to the ED, he was afebrile with blood pressure 105/54 mm Hg and pulse rate 69 bpm.  There were no focal neurologic deficit; he was alert and oriented x4.  He was noted to use accessory muscles for breathing and was also tachypneic. Oxygen saturation was in the 80s therefore he was placed on 2 L via nasal cannula with improvement in oxygenation noted.  Initial labs revealed WBC 14.1, CMP unremarkable, troponin negative, UA negative for UTI.  Chest x-ray was obtained and showed new slight pulmonary vascular congestion.  CT head showed no acute intracranial abnormality.  CT and MRI lumbar spine showed superior endplate fracture at L4 new since last imaging, chronic compression fractures of L1, L2 and L5 and lower lumbar spinal canal stenosis at L3-4 and L4-5.  PAST MEDICAL HISTORY:   Past Medical History:  Diagnosis Date   CHF (congestive heart failure) (HCC)    Chronic back pain    Dementia ([place])    Depression    Emphysema lung ([place])    Hypertension      Personal history of tobacco use, presenting hazards to health 08/06/2015   Pollen allergies    PTSD (post-traumatic stress disorder)    Pulmonary embolism ([place])     PAST SURGICAL HISTORY:   Past Surgical History:  Procedure Laterality Date   BACK SURGERY     KYPHOPLASTY N/A 06/27/2017   Procedure: KYPHOPLASTY -L5;  Surgeon: Hessie Knows, MD;  Location: [facility] ORS;  Service: Orthopedics;  Laterality: N/A;   TONSILLECTOMY AND ADENOIDECTOMY  1980    SOCIAL HISTORY:   Social History   Tobacco Use   Smoking status: Current Every Day Smoker    Packs/day: 0.50    Years: 60.00    Pack years: 30.00    Types: Cigarettes   Smokeless tobacco: Never Used  Substance Use Topics   Alcohol use: No    Alcohol/week: 0.0 standard drinks    FAMILY HISTORY:   Family History  Problem Relation Age of Onset   Alcohol abuse Father    Arthritis Mother    Heart disease Maternal Grandmother    Prostate cancer Brother     DRUG ALLERGIES:   Allergies  Allergen Reactions   Ativan [Lorazepam] Other (See Comments)    Patient gets severe altered mental status and confusion when taking this medicine    REVIEW OF SYSTEMS:   ROS  MEDICATIONS AT HOME:   Prior to Admission medications   Medication Sig Start Date End Date Taking? Authorizing Provider  amiodarone (PACERONE) 200 MG  tablet Two tabs po daily for three more days then take one tablet daily afterwards Patient taking differently: Take 200 mg by mouth daily. Two tabs po daily for three more days then take one tablet daily afterwards 02/23/19  Yes Wieting, Richard, MD  apixaban (ELIQUIS) 5 MG TABS tablet Take 1 tablet (5 mg total) by mouth 2 (two) times daily. 11/28/17  Yes Gladstone Lighter, MD  docusate sodium (COLACE) 100 MG capsule Take 1 capsule (100 mg total) by mouth 2 (two) times daily. 02/23/19 02/23/20 Yes Wieting, Richard, MD  finasteride (PROSCAR) 5 MG tablet Take 1 tablet (5 mg total) by mouth daily. 01/21/19  Yes  Festus Aloe, MD  FLUoxetine (PROZAC) 10 MG capsule Take 10 mg by mouth daily.   Yes [provider]  furosemide (LASIX) 20 MG tablet Take 20 mg by mouth every morning.  06/29/18  Yes [provider]  memantine (NAMENDA) 5 MG tablet Take 1 tablet (5 mg total) by mouth 2 (two) times daily. 02/23/19  Yes Wieting, Richard, MD  metoprolol tartrate (LOPRESSOR) 50 MG tablet Take 1 tablet (50 mg total) by mouth 2 (two) times daily. 02/23/19  Yes Wieting, Richard, MD  OLANZapine (ZYPREXA) 2.5 MG tablet Take 1.25 mg by mouth at bedtime.   Yes [provider]  potassium chloride (K-DUR) 10 MEQ tablet Take 10 mEq by mouth daily. 06/27/18 06/27/19 Yes [provider]  pramipexole (MIRAPEX) 1 MG tablet Take 1 tablet (1 mg total) by mouth at bedtime. 11/22/17  Yes Gladstone Lighter, MD  tamsulosin (FLOMAX) 0.4 MG CAPS capsule Take 2 capsules (0.8 mg total) by mouth daily after supper. Patient taking differently: Take 0.4 mg by mouth daily after supper.  02/02/18  Yes Festus Aloe, MD  traMADol (ULTRAM) 50 MG tablet Take 1 tablet (50 mg total) by mouth 2 (two) times daily. Patient taking differently: Take 25 mg by mouth 2 (two) times daily. Take one-half tablet (25 mg) by mouth twice daily 02/23/19  Yes Wieting, Richard, MD  acetaminophen (TYLENOL) 650 MG CR tablet Take 650 mg by mouth every 8 (eight) hours as needed for pain.    [provider]  ADVAIR DISKUS 250-50 MCG/DOSE AEPB Inhale 1 puff into the lungs every 12 (twelve) hours. 04/09/18   [provider]  albuterol (PROVENTIL HFA;VENTOLIN HFA) 108 (90 Base) MCG/ACT inhaler Inhale 2 puffs into the lungs every 4 (four) hours as needed for wheezing or shortness of breath.     [provider]  albuterol (PROVENTIL) (2.5 MG/3ML) 0.083% nebulizer solution Inhale 2.5 mg into the lungs 4 (four) times daily.  01/15/15   [provider]  lidocaine (LIDODERM) 5 % Place 1 patch onto the skin daily.  Remove & Discard patch within 12 hours or as directed by MD 11/28/18   Gladstone Lighter, MD  nicotine (NICODERM CQ - DOSED IN MG/24 HOURS) 14 mg/24hr patch 14 mg patch transdermal daily (okay to substitute generic) Patient not taking: Reported on 04/12/2019 02/23/19   Loletha Grayer, MD  SPIRIVA HANDIHALER 18 MCG inhalation capsule Place 18 mcg into inhaler and inhale daily. 06/11/18   [provider]      VITAL SIGNS:  Blood pressure 136/75, pulse (!) 53, temperature 98.3 F (36.8 C), temperature source Oral, resp. rate 17, height 5\' 8"  (1.727 m), weight 117.9 kg, SpO2 96 %.  PHYSICAL EXAMINATION:   Physical Exam  GENERAL:  78 y.o.-year-old patient lying in the bed with mild respiratory distress. On 2L via nasal cannula.  EYES: Pupils equal, round, reactive to light and accommodation. No scleral icterus. Extraocular muscles intact.  HEENT: Head atraumatic, normocephalic. Oropharynx and nasopharynx clear.  NECK:  Supple, no jugular venous distention. No thyroid enlargement, no tenderness.  LUNGS: Abnormal breath sounds bilaterally,moderate wheezing auscultated throughout.  No rales,rhonchi or crepitation. No use of accessory muscles of respiration.  CARDIOVASCULAR: S1, S2 normal. No murmurs, rubs, or gallops.  ABDOMEN: Soft, nontender, nondistended. Bowel sounds present. No organomegaly or mass.  EXTREMITIES: Pitting edema of bilateral lower extremities, mild cyanosis and clubbing.    NEUROLOGIC: Cranial nerves II through XII are intact. Muscle strength 5/5 in all extremities. Sensation intact. Gait not checked.  PSYCHIATRIC: The patient is alert and oriented x 3.  SKIN: No obvious rash, lesion, or ulcer.   DATA REVIEWED:  LABORATORY PANEL:   CBC Recent Labs  Lab 04/12/19 1204  WBC 14.1*  HGB 15.3  HCT 48.7  PLT 187   ------------------------------------------------------------------------------------------------------------------  Chemistries  Recent Labs  Lab  04/12/19 1204  NA 138  K 4.1  CL 103  CO2 26  GLUCOSE 93  BUN 13  CREATININE 0.81  CALCIUM 8.6*  AST 20  ALT 7  ALKPHOS 86  BILITOT 1.6*   ------------------------------------------------------------------------------------------------------------------  Cardiac Enzymes Recent Labs  Lab 04/12/19 1204  TROPONINI <0.03   ------------------------------------------------------------------------------------------------------------------  RADIOLOGY:  Dg Lumbar Spine Complete  Result Date: 04/12/2019 CLINICAL DATA:  Fall this morning. Altered mental status. Difficulty breathing. Low back pain. EXAM: LUMBAR SPINE - COMPLETE 4+ VIEW COMPARISON:  03/07/2018 and chest CT 02/15/2019 FINDINGS: Vertebral body alignment is within normal. Mild spondylosis throughout the lumbar spine to include facet arthropathy. Stable L5 compression fracture post kyphoplasty. No change in severe L1 compression fracture. Severe L2 compression fracture new since 03/07/2018. Subtle loss of mid vertebral body height of L4 new since 03/07/2018. No change in a moderate T10 compression fracture. Remainder of the exam is unchanged. IMPRESSION: Mild spondylosis of the lumbar spine. Stable compression fractures of T10, L1 and L5. Severe compression fracture of L2 age indeterminate, but new since 03/07/2018. Mild loss of mid vertebral body height of L4 age indeterminate but new since 03/07/2018. Electronically Signed   By: Marin Olp M.D.   On: 04/12/2019 15:34   Dg Pelvis 1-2 Views  Result Date: 04/12/2019 CLINICAL DATA:  Fall this morning.  Low back pain. EXAM: PELVIS - 1-2 VIEW COMPARISON:  CT 02/15/2019 FINDINGS: Evidence of previous L5 compression fracture post kyphoplasty unchanged. Mild diffuse osteopenia. Minimal symmetric degenerative change of the hips. No acute fracture identified. IMPRESSION: No acute findings. Electronically Signed   By: Marin Olp M.D.   On: 04/12/2019 15:37   Ct Head Wo Contrast  Result  Date: 04/12/2019 CLINICAL DATA:  Weakness. Fell today. Difficulty breathing. History of congestive heart failure. EXAM: CT HEAD WITHOUT CONTRAST TECHNIQUE: Contiguous axial images were obtained from the base of the skull through the vertex without intravenous contrast. COMPARISON:  12/09/2018 FINDINGS: Brain: Generalized atrophy. Chronic small-vessel ischemic changes of the white matter. No sign of acute infarction, mass lesion, hemorrhage, hydrocephalus or extra-axial collection. Vascular: There is atherosclerotic calcification of the major vessels at the base of the brain. Skull: Negative Sinuses/Orbits: Clear/normal Other: None IMPRESSION: No acute or reversible finding. Age related atrophy. Chronic small-vessel ischemic changes of the cerebral hemispheric white matter. Electronically Signed   By: Nelson Chimes M.D.   On: 04/12/2019 12:53   Ct Lumbar Spine Wo Contrast  Result Date: 04/12/2019 CLINICAL DATA:  Fall EXAM: CT  LUMBAR SPINE WITHOUT CONTRAST TECHNIQUE: Multidetector CT imaging of the lumbar spine was performed without intravenous contrast administration. Multiplanar CT image reconstructions were also generated. COMPARISON:  CT abdomen pelvis 06/24/2017 CT chest 02/15/2019 Lumbar spine radiograph 04/12/2019, 03/07/2018 Chest radiograph 02/15/2019 FINDINGS: Segmentation: 5 lumbar type vertebrae. Alignment: Normal. Vertebrae: Severe compression fracture at L1 is unchanged since the chest CT of 02/15/2019. Severe compression fracture of L2 with incompletely visualized on the 02/15/2019 CT but it is unchanged compared to the 02/15/2019 radiograph. There is superior endplate fracture of L4 with approximately 25% height loss. No retropulsion. Augmented L5 compression fracture is unchanged. Paraspinal and other soft tissues: Calcific aortic atherosclerosis. Disc levels: T11-12: Unremarkable. T12-L1: No spinal canal or neural foraminal stenosis. L1-2: No spinal canal stenosis or neural impingement. L2-3: Mild  right and moderate-to-severe left neural foraminal stenosis. No spinal canal stenosis. L3-4: Disc bulge and mild facet hypertrophy with moderate spinal canal stenosis. L4-5: Right eccentric disc bulge with mild spinal canal stenosis. L5-S1: Mild-to-moderate neural foraminal stenosis bilaterally. No spinal canal stenosis. IMPRESSION: 1. Superior endplate fracture at L4 25% height loss is age indeterminate but new since 03/07/2018. Acuity might be better characterized by lumbar spine MRI. 2. Chronic compression fractures of L1, L2 and L5. 3. 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 4.  Aortic atherosclerosis (ICD10-I70.0). Electronically Signed   By: Ulyses Jarred M.D.   On: 04/12/2019 16:31   Mr Lumbar Spine Wo Contrast  Result Date: 04/12/2019 CLINICAL DATA:  Fall EXAM: MRI LUMBAR SPINE WITHOUT CONTRAST TECHNIQUE: Multiplanar, multisequence MR imaging of the lumbar spine was performed. No intravenous contrast was administered. COMPARISON:  Lumbar spine CT 04/12/2019 FINDINGS: The examination was discontinued prior to completion due to patient inability to tolerate the full length of the scan. Three sequences were acquired. Most of the sequences are severely motion degraded. Segmentation: Standard. Alignment:  Physiologic. Vertebrae: Chronic compression fractures of L1, L2 and L5. There is mild edema underlying the superior endplate of L4. 36% height loss at L4. Disc levels: Assessment of the spinal canal is limited because of the degree of motion. There is severe spinal canal stenosis at L3-4 and L4-5. IMPRESSION: 1. Motion degraded and truncated examination. 2. Mild edema underlying the superior endplate of L4, where there is 25% height loss. The fracture remains strictly age indeterminate. 3. Chronic compression fractures of L1, L2 and L5. Electronically Signed   By: Ulyses Jarred M.D.   On: 04/12/2019 18:05   Dg Chest Portable 1 View  Result Date: 04/12/2019 CLINICAL DATA:  The patient fell this  morning.  Weakness. EXAM: PORTABLE CHEST 1 VIEW COMPARISON:  CT scan and chest x-ray dated 02/15/2019 FINDINGS: The heart size is normal. There is slight pulmonary vascular congestion which is new. No infiltrates or effusions. Slight scarring at the left lung base with multiple old healed left lateral rib fractures. Aortic atherosclerosis. IMPRESSION: New slight pulmonary vascular congestion. No other acute abnormalities. Aortic Atherosclerosis (ICD10-I70.0). Electronically Signed   By: Lorriane Shire M.D.   On: 04/12/2019 12:27    EKG:  EKG: there are no previous tracings available for comparison.  IMPRESSION AND PLAN:   78 y.o. male with a known history of pulmonary embolism, atrial fibrillation on Eliquis, CHF, chronic back pain from chronic compression fracture status post kyphoplasty, dementia, hypertension, and recurrent falls presenting  with chief complaints of shortness of breath and altered mental status.  1.  Acute respiratory insufficiency in the setting of acute CHF exacerbation,may also be  the cause of his altered mental status in a patient with underlying dementia. - Supplemental O2, goal sat 88-92% - Bronchodilators (albuterol/ipratropium) standing and PRN  2. Acute on chronic diastolic CHF exacerbation likely due to volume overload/pulmonary edema - Admit to telemetry unit - Last Echo on 02/20/2019,  EF 55 to 60% - Beta-Blockade: Metoprolol  - Diuretics: Furosemide 20 IV BID - Low salt diet   3.  Low back pain-has chronic low back pain.  History of L1 kyphoplasty. -Pain medication as tolerated.  Lidoderm patch ordered for now  4.  Tobacco use disorder-counseled, will start nicotine patch. - Smoking cessation counseling provided  5.  History of pulmonary embolism - continue Eliquis  6.  BPH-follows with urologist as outpatient.  - Continue Proscar and high-dose Flomax  7.  Paroxysmal atrial fibrillation -rate controlled on amiodarone and metoprolol - Continue  Eliquis  8.  DVT prophylaxis-on Eliquis as above  All the records are reviewed and case discussed with ED provider. Management plans discussed with the patient, family and they are in agreement.  CODE STATUS: DNR carries yellow DNR form  TOTAL TIME TAKING CARE OF THIS PATIENT: 50 minutes.    on 04/12/2019 at 7:08 PM  This patient was staffed with Dr. Fritzi Mandes who personally evaluated patient, reviewed documentation and agreed with assessment and plan of care as above.  Rufina Falco, DNP, FNP-BC [facility] Nurse Practitioner Between 7am to 6pm - Pager [phone]  After 6pm go to [email or web address] - password [email or web address] [facility]  Office  [phone]  CC: Primary care physician; Rusty Aus, MD

## 2019-04-12 NOTE — Progress Notes (Signed)
Family Meeting Note  Advance Directive: yes. Patient care is out of facility yellow DNR form  Patient is being admitted with acute on chronic diastolic congestive heart failure. He has multiple comorbidities with a fib, compression fracture, dementia, history of pulmonary embolism. He is from nursing home facility. Palliative care consultation as needed. Patient is a DNR. Time spent 16 minutes Fritzi Mandes, MD

## 2019-04-12 NOTE — ED Notes (Signed)
Patient transported to X-ray 

## 2019-04-12 NOTE — ED Notes (Signed)
ED TO INPATIENT HANDOFF REPORT  ED Nurse Name and Phone #: [phone]  S Name/Age/Gender Ricardo Erickson 78 y.o. male Room/Bed: ED09A/ED09A  Code Status   Code Status: Prior  Home/SNF/Other Nursing Home Patient oriented to: self and place Is this baseline? No   Triage Complete: Triage complete  Chief Complaint altered mental status ems  Triage Note Pt to ED via EMS from [facility], pt arrives working to breathe. Per ems pt fell this morning and family reports AMS, pt it a&o x4. Pt arrives belly breathing and tachypnic on 4l not on o2 doesn't normally wear o2. Pt has hx of CHF. Bilateral lower leg pitting edema. Pt states he feels weak.     Allergies Allergies  Allergen Reactions  . Ativan [Lorazepam] Other (See Comments)    Patient gets severe altered mental status and confusion when taking this medicine    Level of Care/Admitting Diagnosis ED Disposition    ED Disposition Condition [facility] Area: [place] [[id number]]  Level of Care: Telemetry [5]  Covid Evaluation: N/A  Diagnosis: Acute exacerbation of congestive heart failure [facility]) [[id number]]  Admitting Physician: Eula Flax  Attending Physician: Odessa Fleming  Estimated length of stay: past midnight tomorrow  Certification:: I certify this patient will need inpatient services for at least 2 midnights  PT Class (Do Not Modify): Inpatient [101]  PT Acc Code (Do Not Modify): Private [1]       B Medical/Surgery History Past Medical History:  Diagnosis Date  . CHF (congestive heart failure) ([place])   . Chronic back pain   . Dementia ([place])   . Depression   . Emphysema lung ([place])   . Hypertension   . Personal history of tobacco use, presenting hazards to health 08/06/2015  . Pollen allergies   . PTSD (post-traumatic stress disorder)   . Pulmonary embolism [facility])    Past Surgical History:  Procedure Laterality Date  . BACK SURGERY    .  KYPHOPLASTY N/A 06/27/2017   Procedure: KYPHOPLASTY -L5;  Surgeon: Hessie Knows, MD;  Location: [facility] ORS;  Service: Orthopedics;  Laterality: N/A;  . TONSILLECTOMY AND ADENOIDECTOMY  1980     A IV Location/Drains/Wounds Patient Lines/Drains/Airways Status   Active Line/Drains/Airways    Name:   Placement date:   Placement time:   Site:   Days:   Peripheral IV 04/12/19 Right Hand   04/12/19    1209    Hand   less than 1   Incision (Closed) 07/23/17 Back   07/23/17    1900     628          Intake/Output Last 24 hours No intake or output data in the 24 hours ending 04/12/19 1927  Labs/Imaging Results for orders placed or performed during the hospital encounter of 04/12/19 (from the past 48 hour(s))  CBC     Status: Abnormal   Collection Time: 04/12/19 12:04 PM  Result Value Ref Range   WBC 14.1 (H) 4.0 - 10.5 K/uL   RBC 5.11 4.22 - 5.81 MIL/uL   Hemoglobin 15.3 13.0 - 17.0 g/dL   HCT 48.7 39.0 - 52.0 %   MCV 95.3 80.0 - 100.0 fL   MCH 29.9 26.0 - 34.0 pg   MCHC 31.4 30.0 - 36.0 g/dL   RDW 15.3 11.5 - 15.5 %   Platelets 187 150 - 400 K/uL   nRBC 0.0 0.0 - 0.2 %    Comment: Performed  at [facility], [address] [place], [place] [address]  Comprehensive metabolic panel     Status: Abnormal   Collection Time: 04/12/19 12:04 PM  Result Value Ref Range   Sodium 138 135 - 145 mmol/L   Potassium 4.1 3.5 - 5.1 mmol/L   Chloride 103 98 - 111 mmol/L   CO2 26 22 - 32 mmol/L   Glucose, Bld 93 70 - 99 mg/dL   BUN 13 8 - 23 mg/dL   Creatinine, Ser 0.81 0.61 - 1.24 mg/dL   Calcium 8.6 (L) 8.9 - 10.3 mg/dL   Total Protein 6.7 6.5 - 8.1 g/dL   Albumin 3.5 3.5 - 5.0 g/dL   AST 20 15 - 41 U/L   ALT 7 0 - 44 U/L   Alkaline Phosphatase 86 38 - 126 U/L   Total Bilirubin 1.6 (H) 0.3 - 1.2 mg/dL   GFR calc non Af Amer >60 >60 mL/min   GFR calc Af Amer >60 >60 mL/min   Anion gap 9 5 - 15    Comment: Performed at [facility], [address] [place], [place]  [address]  Troponin I - ONCE - STAT     Status: None   Collection Time: 04/12/19 12:04 PM  Result Value Ref Range   Troponin I <0.03 <0.03 ng/mL    Comment: Performed at [facility], [address] [place], [place] [address]  Urinalysis, Complete w Microscopic     Status: Abnormal   Collection Time: 04/12/19  2:19 PM  Result Value Ref Range   Color, Urine YELLOW (A) YELLOW   APPearance CLEAR (A) CLEAR   Specific Gravity, Urine 1.012 1.005 - 1.030   pH 5.0 5.0 - 8.0   Glucose, UA NEGATIVE NEGATIVE mg/dL   Hgb urine dipstick MODERATE (A) NEGATIVE   Bilirubin Urine NEGATIVE NEGATIVE   Ketones, ur NEGATIVE NEGATIVE mg/dL   Protein, ur NEGATIVE NEGATIVE mg/dL   Nitrite NEGATIVE NEGATIVE   Leukocytes,Ua NEGATIVE NEGATIVE   RBC / HPF 21-50 0 - 5 RBC/hpf   WBC, UA 0-5 0 - 5 WBC/hpf   Bacteria, UA NONE SEEN NONE SEEN   Squamous Epithelial / LPF NONE SEEN 0 - 5   Mucus PRESENT     Comment: Performed at [facility], [address] [place],  [address]   Dg Lumbar Spine Complete  Result Date: 04/12/2019 CLINICAL DATA:  Fall this morning. Altered mental status. Difficulty breathing. Low back pain. EXAM: LUMBAR SPINE - COMPLETE 4+ VIEW COMPARISON:  03/07/2018 and chest CT 02/15/2019 FINDINGS: Vertebral body alignment is within normal. Mild spondylosis throughout the lumbar spine to include facet arthropathy. Stable L5 compression fracture post kyphoplasty. No change in severe L1 compression fracture. Severe L2 compression fracture new since 03/07/2018. Subtle loss of mid vertebral body height of L4 new since 03/07/2018. No change in a moderate T10 compression fracture. Remainder of the exam is unchanged. IMPRESSION: Mild spondylosis of the lumbar spine. Stable compression fractures of T10, L1 and L5. Severe compression fracture of L2 age indeterminate, but new since 03/07/2018. Mild loss of mid vertebral body height of L4 age indeterminate but new since 03/07/2018.  Electronically Signed   By: Marin Olp M.D.   On: 04/12/2019 15:34   Dg Pelvis 1-2 Views  Result Date: 04/12/2019 CLINICAL DATA:  Fall this morning.  Low back pain. EXAM: PELVIS - 1-2 VIEW COMPARISON:  CT 02/15/2019 FINDINGS: Evidence of previous L5 compression fracture post kyphoplasty unchanged. Mild diffuse osteopenia.  Minimal symmetric degenerative change of the hips. No acute fracture identified. IMPRESSION: No acute findings. Electronically Signed   By: Marin Olp M.D.   On: 04/12/2019 15:37   Ct Head Wo Contrast  Result Date: 04/12/2019 CLINICAL DATA:  Weakness. Fell today. Difficulty breathing. History of congestive heart failure. EXAM: CT HEAD WITHOUT CONTRAST TECHNIQUE: Contiguous axial images were obtained from the base of the skull through the vertex without intravenous contrast. COMPARISON:  12/09/2018 FINDINGS: Brain: Generalized atrophy. Chronic small-vessel ischemic changes of the white matter. No sign of acute infarction, mass lesion, hemorrhage, hydrocephalus or extra-axial collection. Vascular: There is atherosclerotic calcification of the major vessels at the base of the brain. Skull: Negative Sinuses/Orbits: Clear/normal Other: None IMPRESSION: No acute or reversible finding. Age related atrophy. Chronic small-vessel ischemic changes of the cerebral hemispheric white matter. Electronically Signed   By: Nelson Chimes M.D.   On: 04/12/2019 12:53   Ct Lumbar Spine Wo Contrast  Result Date: 04/12/2019 CLINICAL DATA:  Fall EXAM: CT LUMBAR SPINE WITHOUT CONTRAST TECHNIQUE: Multidetector CT imaging of the lumbar spine was performed without intravenous contrast administration. Multiplanar CT image reconstructions were also generated. COMPARISON:  CT abdomen pelvis 06/24/2017 CT chest 02/15/2019 Lumbar spine radiograph 04/12/2019, 03/07/2018 Chest radiograph 02/15/2019 FINDINGS: Segmentation: 5 lumbar type vertebrae. Alignment: Normal. Vertebrae: Severe compression fracture at L1 is  unchanged since the chest CT of 02/15/2019. Severe compression fracture of L2 with incompletely visualized on the 02/15/2019 CT but it is unchanged compared to the 02/15/2019 radiograph. There is superior endplate fracture of L4 with approximately 25% height loss. No retropulsion. Augmented L5 compression fracture is unchanged. Paraspinal and other soft tissues: Calcific aortic atherosclerosis. Disc levels: T11-12: Unremarkable. T12-L1: No spinal canal or neural foraminal stenosis. L1-2: No spinal canal stenosis or neural impingement. L2-3: Mild right and moderate-to-severe left neural foraminal stenosis. No spinal canal stenosis. L3-4: Disc bulge and mild facet hypertrophy with moderate spinal canal stenosis. L4-5: Right eccentric disc bulge with mild spinal canal stenosis. L5-S1: Mild-to-moderate neural foraminal stenosis bilaterally. No spinal canal stenosis. IMPRESSION: 1. Superior endplate fracture at L4 25% height loss is age indeterminate but new since 03/07/2018. Acuity might be better characterized by lumbar spine MRI. 2. Chronic compression fractures of L1, L2 and L5. 3. 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 4.  Aortic atherosclerosis (ICD10-I70.0). Electronically Signed   By: Ulyses Jarred M.D.   On: 04/12/2019 16:31   Mr Lumbar Spine Wo Contrast  Result Date: 04/12/2019 CLINICAL DATA:  Fall EXAM: MRI LUMBAR SPINE WITHOUT CONTRAST TECHNIQUE: Multiplanar, multisequence MR imaging of the lumbar spine was performed. No intravenous contrast was administered. COMPARISON:  Lumbar spine CT 04/12/2019 FINDINGS: The examination was discontinued prior to completion due to patient inability to tolerate the full length of the scan. Three sequences were acquired. Most of the sequences are severely motion degraded. Segmentation: Standard. Alignment:  Physiologic. Vertebrae: Chronic compression fractures of L1, L2 and L5. There is mild edema underlying the superior endplate of L4. 26% height loss at  L4. Disc levels: Assessment of the spinal canal is limited because of the degree of motion. There is severe spinal canal stenosis at L3-4 and L4-5. IMPRESSION: 1. Motion degraded and truncated examination. 2. Mild edema underlying the superior endplate of L4, where there is 25% height loss. The fracture remains strictly age indeterminate. 3. Chronic compression fractures of L1, L2 and L5. Electronically Signed   By: Ulyses Jarred M.D.   On: 04/12/2019 18:05   Dg  Chest Portable 1 View  Result Date: 04/12/2019 CLINICAL DATA:  The patient fell this morning.  Weakness. EXAM: PORTABLE CHEST 1 VIEW COMPARISON:  CT scan and chest x-ray dated 02/15/2019 FINDINGS: The heart size is normal. There is slight pulmonary vascular congestion which is new. No infiltrates or effusions. Slight scarring at the left lung base with multiple old healed left lateral rib fractures. Aortic atherosclerosis. IMPRESSION: New slight pulmonary vascular congestion. No other acute abnormalities. Aortic Atherosclerosis (ICD10-I70.0). Electronically Signed   By: Lorriane Shire M.D.   On: 04/12/2019 12:27    Pending Labs Unresulted Labs (From admission, onward)   None      Vitals/Pain Today's Vitals   04/12/19 1155 04/12/19 1156 04/12/19 1330 04/12/19 1430  BP: 119/70  (!) 144/74 136/75  Pulse: (!) 52  (!) 55 (!) 53  Resp: (!) 23  (!) 21 17  Temp: 98.3 F (36.8 C)     TempSrc: Oral     SpO2: 99%  96% 96%  Weight:  117.9 kg    Height:  5\' 8"  (1.727 m)    PainSc:  7       Isolation Precautions No active isolations  Medications Medications  sodium chloride 0.9 % bolus 1,000 mL (1,000 mLs Intravenous Bolus 04/12/19 1209)  furosemide (LASIX) injection 20 mg (20 mg Intravenous Given 04/12/19 1832)    Mobility walks with device Low fall risk   Focused Assessments Neuro Assessment Handoff:  Swallow screen pass? na Cardiac Rhythm: Normal sinus rhythm       Neuro Assessment: Within Defined Limits Neuro Checks:       Last Documented NIHSS Modified Score:   Has TPA been given? No If patient is a Neuro Trauma and patient is going to OR before floor call report to [place] nurse: [phone] or [phone]     R Recommendations: See Admitting Provider Note  Report given to:   Additional Notes:

## 2019-04-12 NOTE — ED Notes (Signed)
pts family called and updated

## 2019-04-13 MED ORDER — FUROSEMIDE 10 MG/ML IJ SOLN
20.0000 mg | Freq: Two times a day (BID) | INTRAMUSCULAR | Status: DC
  Administered 2019-04-13 – 2019-04-14 (×3): 20 mg via INTRAVENOUS
  Filled 2019-04-13 (×4): qty 2

## 2019-04-13 MED ORDER — NICOTINE 21 MG/24HR TD PT24
21.0000 mg | MEDICATED_PATCH | Freq: Every day | TRANSDERMAL | Status: DC
  Administered 2019-04-13 – 2019-04-17 (×4): 21 mg via TRANSDERMAL
  Filled 2019-04-13 (×5): qty 1

## 2019-04-13 MED ORDER — FUROSEMIDE 20 MG PO TABS: 20.0000 mg | ORAL_TABLET | Freq: Every day | ORAL | Status: DC

## 2019-04-13 MED ORDER — ALBUTEROL SULFATE (2.5 MG/3ML) 0.083% IN NEBU
2.5000 mg | INHALATION_SOLUTION | Freq: Three times a day (TID) | RESPIRATORY_TRACT | Status: DC
  Administered 2019-04-13 – 2019-04-17 (×12): 2.5 mg via RESPIRATORY_TRACT
  Filled 2019-04-13 (×13): qty 3

## 2019-04-13 NOTE — Plan of Care (Signed)
  Problem: Education: Goal: Knowledge of General Education information will improve Description Including pain rating scale, medication(s)/side effects and non-pharmacologic comfort measures Outcome: Progressing   Problem: Health Behavior/Discharge Planning: Goal: Ability to manage health-related needs will improve Outcome: Progressing   Problem: Clinical Measurements: Goal: Respiratory complications will improve Outcome: Progressing   Problem: Activity: Goal: Risk for activity intolerance will decrease Outcome: Progressing   Problem: Nutrition: Goal: Adequate nutrition will be maintained Outcome: Progressing   Problem: Coping: Goal: Level of anxiety will decrease Outcome: Progressing   Problem: Elimination: Goal: Will not experience complications related to urinary retention Outcome: Progressing   Problem: Safety: Goal: Ability to remain free from injury will improve Outcome: Progressing   Problem: Skin Integrity: Goal: Risk for impaired skin integrity will decrease Outcome: Progressing

## 2019-04-13 NOTE — Progress Notes (Addendum)
He has been laying on his left side all day.  He says it's because his other hip hurts.  This morning he implied that it has hurt for a long time.  Attempted to stand him to move him to the chair.  He was unable to bear weight on his right hip and fell back into the bed.  Informed Dr. Verdell Carmine.  CT of right hip ordered. 1540:  Pelvic CT shows no sign of hip fracture.  CT cancelled.

## 2019-04-13 NOTE — Plan of Care (Signed)
No longer disoriented.  Does have short term memory loss

## 2019-04-13 NOTE — Progress Notes (Signed)
PT Cancellation Note  Patient Details Name: Ricardo Erickson MRN: [id number] DOB: Nov 25, 1941   Cancelled Treatment:    Reason Eval/Treat Not Completed: Patient at procedure or test/unavailable   Alanson Puls, PT DPT 04/13/2019, 1:44 PM

## 2019-04-13 NOTE — Progress Notes (Signed)
[place] [facility] [place] NAME: Ricardo Erickson    MR#:  [id number]  DATE OF BIRTH:  [birth year]  SUBJECTIVE:   Patient presented to the hospital due to altered mental status and also shortness of breath and noted to be in mild CHF.  Feels a bit better today.  Continue diuresis with Lasix.  REVIEW OF SYSTEMS:    Review of Systems  Constitutional: Negative for chills and fever.  HENT: Negative for congestion and tinnitus.   Eyes: Negative for blurred vision and double vision.  Respiratory: Positive for shortness of breath. Negative for cough and wheezing.   Cardiovascular: Positive for leg swelling. Negative for chest pain, orthopnea and PND.  Gastrointestinal: Negative for abdominal pain, diarrhea, nausea and vomiting.  Genitourinary: Negative for dysuria and hematuria.  Neurological: Negative for dizziness, sensory change and focal weakness.  All other systems reviewed and are negative.    Nutrition: Heart Healthy/Carb modified Tolerating Diet: Yes Tolerating PT: Await Eval.   DRUG ALLERGIES:   Allergies  Allergen Reactions   Ativan [Lorazepam] Other (See Comments)    Patient gets severe altered mental status and confusion when taking this medicine    VITALS:  Blood pressure (!) 102/53, pulse 76, temperature 99 F (37.2 C), temperature source Oral, resp. rate 20, height 5\' 8"  (1.727 m), weight 87.6 kg, SpO2 90 %.  PHYSICAL EXAMINATION:   Physical Exam  GENERAL:  78 y.o.-year-old patient lying in bed in no acute distress.  EYES: Pupils equal, round, reactive to light and accommodation. No scleral icterus. Extraocular muscles intact.  HEENT: Head atraumatic, normocephalic. Oropharynx and nasopharynx clear.  NECK:  Supple, no jugular venous distention. No thyroid enlargement, no tenderness.  LUNGS: Normal breath sounds bilaterally, no wheezing, minimal rales @ bases, No rhonchi. No use of accessory muscles of respiration.    CARDIOVASCULAR: S1, S2 normal. No murmurs, rubs, or gallops.  ABDOMEN: Soft, nontender, nondistended. Bowel sounds present. No organomegaly or mass.  EXTREMITIES: No cyanosis, clubbing or edema b/l.    NEUROLOGIC: Cranial nerves II through XII are intact. No focal Motor or sensory deficits b/l. Globally weak.    PSYCHIATRIC: The patient is alert and oriented x 3.  SKIN: No obvious rash, lesion, or ulcer.    LABORATORY PANEL:   CBC Recent Labs  Lab 04/12/19 1204  WBC 14.1*  HGB 15.3  HCT 48.7  PLT 187   ------------------------------------------------------------------------------------------------------------------  Chemistries  Recent Labs  Lab 04/12/19 1204  NA 138  K 4.1  CL 103  CO2 26  GLUCOSE 93  BUN 13  CREATININE 0.81  CALCIUM 8.6*  AST 20  ALT 7  ALKPHOS 86  BILITOT 1.6*   ------------------------------------------------------------------------------------------------------------------  Cardiac Enzymes Recent Labs  Lab 04/12/19 1204  TROPONINI <0.03   ------------------------------------------------------------------------------------------------------------------  RADIOLOGY:  Dg Lumbar Spine Complete  Result Date: 04/12/2019 CLINICAL DATA:  Fall this morning. Altered mental status. Difficulty breathing. Low back pain. EXAM: LUMBAR SPINE - COMPLETE 4+ VIEW COMPARISON:  03/07/2018 and chest CT 02/15/2019 FINDINGS: Vertebral body alignment is within normal. Mild spondylosis throughout the lumbar spine to include facet arthropathy. Stable L5 compression fracture post kyphoplasty. No change in severe L1 compression fracture. Severe L2 compression fracture new since 03/07/2018. Subtle loss of mid vertebral body height of L4 new since 03/07/2018. No change in a moderate T10 compression fracture. Remainder of the exam is unchanged. IMPRESSION: Mild spondylosis of the lumbar spine. Stable compression fractures of T10, L1 and L5. Severe  compression fracture of L2  age indeterminate, but new since 03/07/2018. Mild loss of mid vertebral body height of L4 age indeterminate but new since 03/07/2018. Electronically Signed   By: Marin Olp M.D.   On: 04/12/2019 15:34   Dg Pelvis 1-2 Views  Result Date: 04/12/2019 CLINICAL DATA:  Fall this morning.  Low back pain. EXAM: PELVIS - 1-2 VIEW COMPARISON:  CT 02/15/2019 FINDINGS: Evidence of previous L5 compression fracture post kyphoplasty unchanged. Mild diffuse osteopenia. Minimal symmetric degenerative change of the hips. No acute fracture identified. IMPRESSION: No acute findings. Electronically Signed   By: Marin Olp M.D.   On: 04/12/2019 15:37   Ct Head Wo Contrast  Result Date: 04/12/2019 CLINICAL DATA:  Weakness. Fell today. Difficulty breathing. History of congestive heart failure. EXAM: CT HEAD WITHOUT CONTRAST TECHNIQUE: Contiguous axial images were obtained from the base of the skull through the vertex without intravenous contrast. COMPARISON:  12/09/2018 FINDINGS: Brain: Generalized atrophy. Chronic small-vessel ischemic changes of the white matter. No sign of acute infarction, mass lesion, hemorrhage, hydrocephalus or extra-axial collection. Vascular: There is atherosclerotic calcification of the major vessels at the base of the brain. Skull: Negative Sinuses/Orbits: Clear/normal Other: None IMPRESSION: No acute or reversible finding. Age related atrophy. Chronic small-vessel ischemic changes of the cerebral hemispheric white matter. Electronically Signed   By: Nelson Chimes M.D.   On: 04/12/2019 12:53   Ct Lumbar Spine Wo Contrast  Result Date: 04/12/2019 CLINICAL DATA:  Fall EXAM: CT LUMBAR SPINE WITHOUT CONTRAST TECHNIQUE: Multidetector CT imaging of the lumbar spine was performed without intravenous contrast administration. Multiplanar CT image reconstructions were also generated. COMPARISON:  CT abdomen pelvis 06/24/2017 CT chest 02/15/2019 Lumbar spine radiograph 04/12/2019, 03/07/2018 Chest radiograph  02/15/2019 FINDINGS: Segmentation: 5 lumbar type vertebrae. Alignment: Normal. Vertebrae: Severe compression fracture at L1 is unchanged since the chest CT of 02/15/2019. Severe compression fracture of L2 with incompletely visualized on the 02/15/2019 CT but it is unchanged compared to the 02/15/2019 radiograph. There is superior endplate fracture of L4 with approximately 25% height loss. No retropulsion. Augmented L5 compression fracture is unchanged. Paraspinal and other soft tissues: Calcific aortic atherosclerosis. Disc levels: T11-12: Unremarkable. T12-L1: No spinal canal or neural foraminal stenosis. L1-2: No spinal canal stenosis or neural impingement. L2-3: Mild right and moderate-to-severe left neural foraminal stenosis. No spinal canal stenosis. L3-4: Disc bulge and mild facet hypertrophy with moderate spinal canal stenosis. L4-5: Right eccentric disc bulge with mild spinal canal stenosis. L5-S1: Mild-to-moderate neural foraminal stenosis bilaterally. No spinal canal stenosis. IMPRESSION: 1. Superior endplate fracture at L4 25% height loss is age indeterminate but new since 03/07/2018. Acuity might be better characterized by lumbar spine MRI. 2. Chronic compression fractures of L1, L2 and L5. 3. 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 4.  Aortic atherosclerosis (ICD10-I70.0). Electronically Signed   By: Ulyses Jarred M.D.   On: 04/12/2019 16:31   Mr Lumbar Spine Wo Contrast  Result Date: 04/12/2019 CLINICAL DATA:  Fall EXAM: MRI LUMBAR SPINE WITHOUT CONTRAST TECHNIQUE: Multiplanar, multisequence MR imaging of the lumbar spine was performed. No intravenous contrast was administered. COMPARISON:  Lumbar spine CT 04/12/2019 FINDINGS: The examination was discontinued prior to completion due to patient inability to tolerate the full length of the scan. Three sequences were acquired. Most of the sequences are severely motion degraded. Segmentation: Standard. Alignment:  Physiologic. Vertebrae:  Chronic compression fractures of L1, L2 and L5. There is mild edema underlying the superior endplate of L4. 63% height  loss at L4. Disc levels: Assessment of the spinal canal is limited because of the degree of motion. There is severe spinal canal stenosis at L3-4 and L4-5. IMPRESSION: 1. Motion degraded and truncated examination. 2. Mild edema underlying the superior endplate of L4, where there is 25% height loss. The fracture remains strictly age indeterminate. 3. Chronic compression fractures of L1, L2 and L5. Electronically Signed   By: Ulyses Jarred M.D.   On: 04/12/2019 18:05   Dg Chest Portable 1 View  Result Date: 04/12/2019 CLINICAL DATA:  The patient fell this morning.  Weakness. EXAM: PORTABLE CHEST 1 VIEW COMPARISON:  CT scan and chest x-ray dated 02/15/2019 FINDINGS: The heart size is normal. There is slight pulmonary vascular congestion which is new. No infiltrates or effusions. Slight scarring at the left lung base with multiple old healed left lateral rib fractures. Aortic atherosclerosis. IMPRESSION: New slight pulmonary vascular congestion. No other acute abnormalities. Aortic Atherosclerosis (ICD10-I70.0). Electronically Signed   By: Lorriane Shire M.D.   On: 04/12/2019 12:27     ASSESSMENT AND PLAN:   77 y.o. male with a known history of pulmonary embolism, atrial fibrillation on Eliquis, CHF, chronic back pain from chronic compression fracture status post kyphoplasty, dementia, hypertension, and recurrent falls presenting  with chief complaints of shortness of breath and altered mental status.  1.  Acute respiratory insufficiency in the setting of acute CHF exacerbation, with underlying COPD - Continue O2 supplementation and wean as tolerated. -Continue diuresis with IV Lasix, follow I's and O's and daily weights.  Clinically improving.   2. Acute on chronic diastolic CHF exacerbation likely due to volume overload/pulmonary edema - Continue diuresis with IV Lasix, follow I's  and O's and daily weights. - Continue metoprolol - Last Echo on 02/20/2019,  EF 55 to 60%  3. Low back pain-has chronic low back pain. History of L1 kyphoplasty. - MRI of the lumbar spine showing no acute pathology. -Pain medication as tolerated. Lidoderm patch ordered for now  4. Tobacco use disorder- cont. Nicotine patch.   5. History of pulmonary embolism - continue Eliquis  6. BPH-follows with urologist as outpatient.  -Continue Proscar, Flomax and no urinary retention.   7. Paroxysmal atrial fibrillation -rate controlled on amiodarone and metoprolol - Continue Eliquis  8.  COPD-no acute exacerbation.  Continue Dulera, Spiriva. - Continue albuterol nebulizers as needed.  Patient likely will need home oxygen upon discharge.  9.  Weakness/frequent falls- we will get physical therapy consult to assess mobility.  Updated patient's son over the phone.  All the records are reviewed and case discussed with Care Management/Social Worker. Management plans discussed with the patient, family and they are in agreement.  CODE STATUS: DNR  DVT Prophylaxis: Eliquis  TOTAL TIME TAKING CARE OF THIS PATIENT: 30 minutes.   POSSIBLE D/C IN 1-2 DAYS, DEPENDING ON CLINICAL CONDITION.   Henreitta Leber M.D on 04/13/2019 at 12:34 PM  Between 7am to 6pm - Pager - [phone]  After 6pm go to [email or web address] - Proofreader  [facility]  Office  [phone]  CC: Primary care physician; Rusty Aus, MD

## 2019-04-14 LAB — BASIC METABOLIC PANEL
Anion gap: 10 (ref 5–15)
BUN: 13 mg/dL (ref 8–23)
CO2: 27 mmol/L (ref 22–32)
Calcium: 8.6 mg/dL — ABNORMAL LOW (ref 8.9–10.3)
Chloride: 103 mmol/L (ref 98–111)
Creatinine, Ser: 0.71 mg/dL (ref 0.61–1.24)
GFR calc Af Amer: >60 mL/min (ref >60)
GFR calc non Af Amer: >60 mL/min (ref >60)
Glucose, Bld: 109 mg/dL — ABNORMAL HIGH (ref 70–99)
Potassium: 3.2 mmol/L — ABNORMAL LOW (ref 3.5–5.1)
Sodium: 140 mmol/L (ref 135–145)

## 2019-04-14 LAB — MRSA PCR SCREENING: MRSA by PCR: NEGATIVE

## 2019-04-14 MED ORDER — ADULT MULTIVITAMIN W/MINERALS CH
1.0000 | ORAL_TABLET | Freq: Every day | ORAL | Status: DC
  Administered 2019-04-15 – 2019-04-17 (×3): 1 via ORAL
  Filled 2019-04-14 (×3): qty 1

## 2019-04-14 MED ORDER — OLANZAPINE 2.5 MG PO TABS
2.5000 mg | ORAL_TABLET | Freq: Every day | ORAL | Status: DC
  Administered 2019-04-16: 21:00:00 2.5 mg via ORAL
  Filled 2019-04-14 (×4): qty 1

## 2019-04-14 MED ORDER — HALOPERIDOL LACTATE 5 MG/ML IJ SOLN
2.5000 mg | Freq: Once | INTRAMUSCULAR | Status: AC
  Administered 2019-04-14: 2.5 mg via INTRAVENOUS
  Filled 2019-04-14: qty 1

## 2019-04-14 MED ORDER — QUETIAPINE FUMARATE 25 MG PO TABS
25.0000 mg | ORAL_TABLET | Freq: Every day | ORAL | Status: DC
  Administered 2019-04-15: 25 mg via ORAL
  Filled 2019-04-14 (×2): qty 1

## 2019-04-14 MED ORDER — ENSURE ENLIVE PO LIQD
237.0000 mL | Freq: Two times a day (BID) | ORAL | Status: DC
  Administered 2019-04-15 – 2019-04-17 (×5): 237 mL via ORAL

## 2019-04-14 MED ORDER — POTASSIUM CHLORIDE CRYS ER 20 MEQ PO TBCR
40.0000 meq | EXTENDED_RELEASE_TABLET | Freq: Once | ORAL | Status: AC
  Administered 2019-04-14: 40 meq via ORAL
  Filled 2019-04-14: qty 2

## 2019-04-14 NOTE — Progress Notes (Addendum)
[place] [facility] [place] NAME: Ricardo Erickson    MR#:  [id number]  DATE OF BIRTH:  1941-05-04  SUBJECTIVE:   Shortness of breath has improved.  Responding well to IV diuresis.  Creatinine remained stable.  Remains somewhat confused and still complaining of some right hip/lower back pain.  REVIEW OF SYSTEMS:    Review of Systems  Constitutional: Negative for chills and fever.  HENT: Negative for congestion and tinnitus.   Eyes: Negative for blurred vision and double vision.  Respiratory: Positive for shortness of breath. Negative for cough and wheezing.   Cardiovascular: Negative for chest pain, orthopnea, leg swelling and PND.  Gastrointestinal: Negative for abdominal pain, diarrhea, nausea and vomiting.  Genitourinary: Negative for dysuria and hematuria.  Musculoskeletal: Positive for back pain and joint pain (right hip).  Neurological: Negative for dizziness, sensory change and focal weakness.  All other systems reviewed and are negative.    Nutrition: Heart Healthy/Carb modified Tolerating Diet: Yes Tolerating PT: Await Eval.   DRUG ALLERGIES:   Allergies  Allergen Reactions   Ativan [Lorazepam] Other (See Comments)    Patient gets severe altered mental status and confusion when taking this medicine    VITALS:  Blood pressure 112/72, pulse 84, temperature 98.4 F (36.9 C), temperature source Oral, resp. rate 18, height 5\' 8"  (1.727 m), weight 87.2 kg, SpO2 90 %.  PHYSICAL EXAMINATION:   Physical Exam  GENERAL:  78 y.o.-year-old patient lying in bed in no acute distress.  EYES: Pupils equal, round, reactive to light and accommodation. No scleral icterus. Extraocular muscles intact.  HEENT: Head atraumatic, normocephalic. Oropharynx and nasopharynx clear.  NECK:  Supple, no jugular venous distention. No thyroid enlargement, no tenderness.  LUNGS: Normal breath sounds bilaterally, no wheezing, minimal rales @ bases, No rhonchi.  No use of accessory muscles of respiration.  CARDIOVASCULAR: S1, S2 normal. No murmurs, rubs, or gallops.  ABDOMEN: Soft, nontender, nondistended. Bowel sounds present. No organomegaly or mass.  EXTREMITIES: No cyanosis, clubbing or edema b/l.    NEUROLOGIC: Cranial nerves II through XII are intact. No focal Motor or sensory deficits b/l. Globally weak.    PSYCHIATRIC: The patient is alert and oriented x 2.  SKIN: No obvious rash, lesion, or ulcer.    LABORATORY PANEL:   CBC Recent Labs  Lab 04/12/19 1204  WBC 14.1*  HGB 15.3  HCT 48.7  PLT 187   ------------------------------------------------------------------------------------------------------------------  Chemistries  Recent Labs  Lab 04/12/19 1204 04/14/19 0504  NA 138 140  K 4.1 3.2*  CL 103 103  CO2 26 27  GLUCOSE 93 109*  BUN 13 13  CREATININE 0.81 0.71  CALCIUM 8.6* 8.6*  AST 20  --   ALT 7  --   ALKPHOS 86  --   BILITOT 1.6*  --    ------------------------------------------------------------------------------------------------------------------  Cardiac Enzymes Recent Labs  Lab 04/12/19 1204  TROPONINI <0.03   ------------------------------------------------------------------------------------------------------------------  RADIOLOGY:  Dg Lumbar Spine Complete  Result Date: 04/12/2019 CLINICAL DATA:  Fall this morning. Altered mental status. Difficulty breathing. Low back pain. EXAM: LUMBAR SPINE - COMPLETE 4+ VIEW COMPARISON:  03/07/2018 and chest CT 02/15/2019 FINDINGS: Vertebral body alignment is within normal. Mild spondylosis throughout the lumbar spine to include facet arthropathy. Stable L5 compression fracture post kyphoplasty. No change in severe L1 compression fracture. Severe L2 compression fracture new since 03/07/2018. Subtle loss of mid vertebral body height of L4 new since 03/07/2018. No change in a moderate T10  compression fracture. Remainder of the exam is unchanged. IMPRESSION: Mild  spondylosis of the lumbar spine. Stable compression fractures of T10, L1 and L5. Severe compression fracture of L2 age indeterminate, but new since 03/07/2018. Mild loss of mid vertebral body height of L4 age indeterminate but new since 03/07/2018. Electronically Signed   By: Marin Olp M.D.   On: 04/12/2019 15:34   Dg Pelvis 1-2 Views  Result Date: 04/12/2019 CLINICAL DATA:  Fall this morning.  Low back pain. EXAM: PELVIS - 1-2 VIEW COMPARISON:  CT 02/15/2019 FINDINGS: Evidence of previous L5 compression fracture post kyphoplasty unchanged. Mild diffuse osteopenia. Minimal symmetric degenerative change of the hips. No acute fracture identified. IMPRESSION: No acute findings. Electronically Signed   By: Marin Olp M.D.   On: 04/12/2019 15:37   Ct Head Wo Contrast  Result Date: 04/12/2019 CLINICAL DATA:  Weakness. Fell today. Difficulty breathing. History of congestive heart failure. EXAM: CT HEAD WITHOUT CONTRAST TECHNIQUE: Contiguous axial images were obtained from the base of the skull through the vertex without intravenous contrast. COMPARISON:  12/09/2018 FINDINGS: Brain: Generalized atrophy. Chronic small-vessel ischemic changes of the white matter. No sign of acute infarction, mass lesion, hemorrhage, hydrocephalus or extra-axial collection. Vascular: There is atherosclerotic calcification of the major vessels at the base of the brain. Skull: Negative Sinuses/Orbits: Clear/normal Other: None IMPRESSION: No acute or reversible finding. Age related atrophy. Chronic small-vessel ischemic changes of the cerebral hemispheric white matter. Electronically Signed   By: Nelson Chimes M.D.   On: 04/12/2019 12:53   Ct Lumbar Spine Wo Contrast  Result Date: 04/12/2019 CLINICAL DATA:  Fall EXAM: CT LUMBAR SPINE WITHOUT CONTRAST TECHNIQUE: Multidetector CT imaging of the lumbar spine was performed without intravenous contrast administration. Multiplanar CT image reconstructions were also generated. COMPARISON:   CT abdomen pelvis 06/24/2017 CT chest 02/15/2019 Lumbar spine radiograph 04/12/2019, 03/07/2018 Chest radiograph 02/15/2019 FINDINGS: Segmentation: 5 lumbar type vertebrae. Alignment: Normal. Vertebrae: Severe compression fracture at L1 is unchanged since the chest CT of 02/15/2019. Severe compression fracture of L2 with incompletely visualized on the 02/15/2019 CT but it is unchanged compared to the 02/15/2019 radiograph. There is superior endplate fracture of L4 with approximately 25% height loss. No retropulsion. Augmented L5 compression fracture is unchanged. Paraspinal and other soft tissues: Calcific aortic atherosclerosis. Disc levels: T11-12: Unremarkable. T12-L1: No spinal canal or neural foraminal stenosis. L1-2: No spinal canal stenosis or neural impingement. L2-3: Mild right and moderate-to-severe left neural foraminal stenosis. No spinal canal stenosis. L3-4: Disc bulge and mild facet hypertrophy with moderate spinal canal stenosis. L4-5: Right eccentric disc bulge with mild spinal canal stenosis. L5-S1: Mild-to-moderate neural foraminal stenosis bilaterally. No spinal canal stenosis. IMPRESSION: 1. Superior endplate fracture at L4 25% height loss is age indeterminate but new since 03/07/2018. Acuity might be better characterized by lumbar spine MRI. 2. Chronic compression fractures of L1, L2 and L5. 3. 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 4.  Aortic atherosclerosis (ICD10-I70.0). Electronically Signed   By: Ulyses Jarred M.D.   On: 04/12/2019 16:31   Mr Lumbar Spine Wo Contrast  Result Date: 04/12/2019 CLINICAL DATA:  Fall EXAM: MRI LUMBAR SPINE WITHOUT CONTRAST TECHNIQUE: Multiplanar, multisequence MR imaging of the lumbar spine was performed. No intravenous contrast was administered. COMPARISON:  Lumbar spine CT 04/12/2019 FINDINGS: The examination was discontinued prior to completion due to patient inability to tolerate the full length of the scan. Three sequences were acquired.  Most of the sequences are severely motion degraded. Segmentation: Standard.  Alignment:  Physiologic. Vertebrae: Chronic compression fractures of L1, L2 and L5. There is mild edema underlying the superior endplate of L4. 48% height loss at L4. Disc levels: Assessment of the spinal canal is limited because of the degree of motion. There is severe spinal canal stenosis at L3-4 and L4-5. IMPRESSION: 1. Motion degraded and truncated examination. 2. Mild edema underlying the superior endplate of L4, where there is 25% height loss. The fracture remains strictly age indeterminate. 3. Chronic compression fractures of L1, L2 and L5. Electronically Signed   By: Ulyses Jarred M.D.   On: 04/12/2019 18:05   Dg Chest Portable 1 View  Result Date: 04/12/2019 CLINICAL DATA:  The patient fell this morning.  Weakness. EXAM: PORTABLE CHEST 1 VIEW COMPARISON:  CT scan and chest x-ray dated 02/15/2019 FINDINGS: The heart size is normal. There is slight pulmonary vascular congestion which is new. No infiltrates or effusions. Slight scarring at the left lung base with multiple old healed left lateral rib fractures. Aortic atherosclerosis. IMPRESSION: New slight pulmonary vascular congestion. No other acute abnormalities. Aortic Atherosclerosis (ICD10-I70.0). Electronically Signed   By: Lorriane Shire M.D.   On: 04/12/2019 12:27     ASSESSMENT AND PLAN:   78 y.o. male with a known history of pulmonary embolism, atrial fibrillation on Eliquis, CHF, chronic back pain from chronic compression fracture status post kyphoplasty, dementia, hypertension, and recurrent falls presenting  with chief complaints of shortness of breath and altered mental status.  1.  Acute respiratory insufficiency in the setting of acute CHF exacerbation, with underlying COPD -Responding well to IV diuresis.  Continue to follow I's and O's and daily weights.  Wean oxygen as tolerated.  2. Acute on chronic diastolic CHF exacerbation likely due to volume  overload/pulmonary edema - Continue diuresis with IV Lasix, responding to IV lasix and will cont. To monitor. I's and O's and daily weights.  - Continue metoprolol - Last Echo on 02/20/2019,  EF 55 to 60%  3. Low back pain-has chronic low back pain. History of L1 kyphoplasty. - MRI of the lumbar spine showing no acute pathology. -Pain medication as tolerated. Lidoderm patch ordered for now  4. Tobacco use disorder- cont. Nicotine patch.   5. History of pulmonary embolism - continue Eliquis  6. BPH-follows with urologist as outpatient.  -Continue Proscar, Flomax and no urinary retention.   7. Paroxysmal atrial fibrillation -rate controlled on amiodarone and metoprolol - Continue Eliquis  8.  COPD-no acute exacerbation.  Continue Dulera, Spiriva. - Continue albuterol nebulizers as needed.  Patient likely will need home oxygen upon discharge.  9.  Weakness/frequent falls- Await PT eval.   10. Hypokalemia - due to diuresis and will give PO supplement and repeat in a.m.  - check Mg. Level in a.m.   All the records are reviewed and case discussed with Care Management/Social Worker. Management plans discussed with the patient, family and they are in agreement.  CODE STATUS: DNR  DVT Prophylaxis: Eliquis  TOTAL TIME TAKING CARE OF THIS PATIENT: 30 minutes.   POSSIBLE D/C IN 1-2 DAYS, DEPENDING ON CLINICAL CONDITION.   Henreitta Leber M.D on 04/14/2019 at 11:56 AM  Between 7am to 6pm - Pager - [phone]  After 6pm go to [email or web address] - Proofreader  [facility]  Office  [phone]  CC: Primary care physician; Rusty Aus, MD

## 2019-04-14 NOTE — Progress Notes (Signed)
PT Cancellation Note  Patient Details Name: Ricardo Erickson MRN: [id number] DOB: July 12, 1941   Cancelled Treatment:    Reason Eval/Treat Not Completed: Patient's level of consciousness;Other (comment)(Patient continues to be too agitated to participate in physical therapy. Continues to call out "help me." Haldol was adminstered at 1322. Will re-attempt at later time/date. )   Everlean Alstrom. Graylon Good, PT, DPT 04/14/19, 3:18 PM

## 2019-04-14 NOTE — Plan of Care (Signed)
  Problem: Education: Goal: Knowledge of General Education information will improve Description Including pain rating scale, medication(s)/side effects and non-pharmacologic comfort measures Outcome: Not Progressing Note:  Patient overtly confused, worsening throughout the shift. At one point patient naked, monitor leads ripped off and the end of his condom catheter was lodged in an armpit. Patient has however converted from A-Fib to NSR. Will continue to monitor neurological status. [name] [facility]

## 2019-04-14 NOTE — Progress Notes (Signed)
IV team nurse placed new PIV to patient's R forearm. About 15 minutes ago, for the second time in a matter of hours, patient ripped it out again (I believe it had even been wrapped). Blood all over patient's arm, bed linen, and the bed itself. IV somewhere on the floor (later removed and placed in sharps container). When asked why he had ripped out the original IV, patient stated it was an accident. When asked why he had ripped out the second PIV, patient stated "because I didn't want it". Explained the importance of maintaining an IV, patient appeared agitated and acutely delirious. Still awaiting sitter to arrive. Will continue to monitor. Complete bed linen changed by Mykia, NT. [name] [facility]

## 2019-04-14 NOTE — Progress Notes (Signed)
PT Cancellation Note  Patient Details Name: Ricardo Erickson MRN: [id number] DOB: 1941-07-28   Cancelled Treatment:    Reason Eval/Treat Not Completed: Patient's level of consciousness;Other (comment)(Patient highly agitated when PT arrived, confused, unable to cooperate. Assisted 3-4 NT to calm pt and help him back to center of bed, assisted with rolling for pericare. Patient calmed down but continues to be confused and not appropriate for PT evaluation at this time. Helped pt call his son, [name], on cell phone, patient left VM instructing son to call Clarise Cruz the PT if he had questions. Will attempt eval again this afternoon).    Everlean Alstrom. Graylon Good, PT, DPT 04/14/19, 11:53 AM

## 2019-04-14 NOTE — Plan of Care (Signed)
  Problem: Clinical Measurements: Goal: Will remain free from infection Outcome: Progressing Goal: Diagnostic test results will improve Outcome: Progressing Goal: Cardiovascular complication will be avoided Outcome: Progressing   Problem: Pain Managment: Goal: General experience of comfort will improve Outcome: Progressing   Problem: Safety: Goal: Ability to remain free from injury will improve Outcome: Progressing   Problem: Education: Goal: Ability to demonstrate management of disease process will improve Outcome: Progressing   Problem: Cardiac: Goal: Ability to achieve and maintain adequate cardiopulmonary perfusion will improve Outcome: Progressing

## 2019-04-14 NOTE — Progress Notes (Signed)
Initial Nutrition Assessment  RD working remotely.  DOCUMENTATION CODES:   Not applicable  INTERVENTION:  Recommend liberalizing diet to 2 gram sodium.  Provide Ensure Enlive po BID, each supplement provides 350 kcal and 20 grams of protein.  Provide daily MVI.  NUTRITION DIAGNOSIS:   Increased nutrient needs related to catabolic illness(COPD, CHF) as evidenced by estimated needs.  GOAL:   Patient will meet greater than or equal to 90% of their needs  MONITOR:   PO intake, Supplement acceptance, Labs, Weight trends, I & O's  REASON FOR ASSESSMENT:   Malnutrition Screening Tool    ASSESSMENT:   78 year old male with PMHx of dementia, depression, emphysema of lung, COPD, PTSD, chronic back pain, HTN, CHF, hx pulmonary embolism, recurrent falls admitted with shortness of breath and AMS found to have acute CHF exacerbation.   Attempted to call patient to obtain nutrition/weight history but he was unable to answer phone. Patient known to this RD from two previous assessments in 2018. He was a very poor historian at both of those assessments. Patient previously did not like the food provided at the hospital and instead preferred sweets brought in by family or ice cream. He is edentulous and on previous admissions has not had dentures present (unsure if they were left at home or he did not have any). No hx of DM per chart. Last HgbA1c was 6 on 01/18/2017.  Patient was 221.4 lbs on 06/19/2017 and 204.1 lbs on 11/19/2017. Patient is currently 87.2 kg (192.27 lbs). Weight loss has not been significant for time.  Medications reviewed and include: amiodarone, Colace 100 mg BID, Lasix 20 mg BID IV, nicotine patch, potassium chloride 10 Eq daily PO, Flomax, Ultram.  Labs reviewed: Potassium 3.2.  NUTRITION - FOCUSED PHYSICAL EXAM:  Unable to complete at this time.  Diet Order:   Diet Order            Diet heart healthy/carb modified Room service appropriate? Yes; Fluid consistency:  Thin  Diet effective now             EDUCATION NEEDS:   Not appropriate for education at this time  Skin:  Skin Assessment: Reviewed RN Assessment(ecchymosis)  Last BM:  Unknown/PTA  Height:   Ht Readings from Last 1 Encounters:  04/12/19 5\' 8"  (1.727 m)   Weight:   Wt Readings from Last 1 Encounters:  04/14/19 87.2 kg   Ideal Body Weight:  70 kg  BMI:  Body mass index is 29.24 kg/m.  Estimated Nutritional Needs:   Kcal:  1900-2100  Protein:  100-110 grams  Fluid:  per MD  Willey Blade, MS, RD, LDN Office: [phone] Pager: [phone] After Hours/Weekend Pager: [phone]

## 2019-04-15 LAB — MAGNESIUM: Magnesium: 2 mg/dL (ref 1.7–2.4)

## 2019-04-15 LAB — POTASSIUM: Potassium: 3.4 mmol/L — ABNORMAL LOW (ref 3.5–5.1)

## 2019-04-15 MED ORDER — FUROSEMIDE 20 MG PO TABS
20.0000 mg | ORAL_TABLET | ORAL | Status: DC
  Administered 2019-04-15 – 2019-04-17 (×3): 20 mg via ORAL
  Filled 2019-04-15 (×3): qty 1

## 2019-04-15 MED ORDER — MEMANTINE HCL 5 MG PO TABS
5.0000 mg | ORAL_TABLET | Freq: Two times a day (BID) | ORAL | Status: DC
  Administered 2019-04-15 – 2019-04-17 (×5): 5 mg via ORAL
  Filled 2019-04-15 (×6): qty 1

## 2019-04-15 MED ORDER — POTASSIUM CHLORIDE CRYS ER 20 MEQ PO TBCR
40.0000 meq | EXTENDED_RELEASE_TABLET | Freq: Once | ORAL | Status: AC
  Administered 2019-04-15: 40 meq via ORAL
  Filled 2019-04-15: qty 2

## 2019-04-15 NOTE — Progress Notes (Signed)
Spoke with Illene Bolus, RN concerning need for IV. Patient has been here two days, has a Air cabin crew ordered, but has pulled out three PIVs in that time. There are two IV medications, prn Zofran and scheduled Lasix 20 mg, where both can be ordered PO, as all other medications are PO. Nurse to confer with MD to clarify need for IV considering the risks of infection and vessel damage. Illene Bolus, RN to replace order if needed per MD.

## 2019-04-15 NOTE — Progress Notes (Signed)
[place] [facility] [place] NAME: Ricardo Erickson    MR#:  [id number]  DATE OF BIRTH:  08/28/1941  SUBJECTIVE:   Patient was a bit agitated yesterday afternoon and received multiple doses of Haldol.  Also received Seroquel at bedtime.  Mental status is slightly improved today patient is more alert and following commands.  REVIEW OF SYSTEMS:    Review of Systems  Constitutional: Negative for chills and fever.  HENT: Negative for congestion and tinnitus.   Eyes: Negative for blurred vision and double vision.  Respiratory: Negative for cough, shortness of breath and wheezing.   Cardiovascular: Negative for chest pain, orthopnea, leg swelling and PND.  Gastrointestinal: Negative for abdominal pain, diarrhea, nausea and vomiting.  Genitourinary: Negative for dysuria and hematuria.  Musculoskeletal: Positive for back pain and joint pain (right hip).  Neurological: Negative for dizziness, sensory change and focal weakness.  All other systems reviewed and are negative.    Nutrition: Heart Healthy/Carb modified Tolerating Diet: Yes Tolerating PT: Await Eval.   DRUG ALLERGIES:   Allergies  Allergen Reactions  . Ativan [Lorazepam] Other (See Comments)    Patient gets severe altered mental status and confusion when taking this medicine    VITALS:  Blood pressure (!) 128/59, pulse 74, temperature 98.4 F (36.9 C), temperature source Oral, resp. rate 18, height 5\' 8"  (1.727 m), weight 83.7 kg, SpO2 92 %.  PHYSICAL EXAMINATION:   Physical Exam  GENERAL:  78 y.o.-year-old patient lying in bed in no acute distress.  EYES: Pupils equal, round, reactive to light and accommodation. No scleral icterus. Extraocular muscles intact.  HEENT: Head atraumatic, normocephalic. Oropharynx and nasopharynx clear.  NECK:  Supple, no jugular venous distention. No thyroid enlargement, no tenderness.  LUNGS: Normal breath sounds bilaterally, no wheezing, minimal rales  @ bases, No rhonchi. No use of accessory muscles of respiration.  CARDIOVASCULAR: S1, S2 normal. No murmurs, rubs, or gallops.  ABDOMEN: Soft, nontender, nondistended. Bowel sounds present. No organomegaly or mass.  EXTREMITIES: No cyanosis, clubbing or edema b/l.    NEUROLOGIC: Cranial nerves II through XII are intact. No focal Motor or sensory deficits b/l. Globally weak.    PSYCHIATRIC: The patient is alert and oriented x 2.  SKIN: No obvious rash, lesion, or ulcer.    LABORATORY PANEL:   CBC Recent Labs  Lab 04/12/19 1204  WBC 14.1*  HGB 15.3  HCT 48.7  PLT 187   ------------------------------------------------------------------------------------------------------------------  Chemistries  Recent Labs  Lab 04/12/19 1204 04/14/19 0504 04/15/19 0401  NA 138 140  --   K 4.1 3.2* 3.4*  CL 103 103  --   CO2 26 27  --   GLUCOSE 93 109*  --   BUN 13 13  --   CREATININE 0.81 0.71  --   CALCIUM 8.6* 8.6*  --   MG  --   --  2.0  AST 20  --   --   ALT 7  --   --   ALKPHOS 86  --   --   BILITOT 1.6*  --   --    ------------------------------------------------------------------------------------------------------------------  Cardiac Enzymes Recent Labs  Lab 04/12/19 1204  TROPONINI <0.03   ------------------------------------------------------------------------------------------------------------------  RADIOLOGY:  No results found.   ASSESSMENT AND PLAN:   78 y.o. male with a known history of pulmonary embolism, atrial fibrillation on Eliquis, CHF, chronic back pain from chronic compression fracture status post kyphoplasty, dementia, hypertension, and recurrent falls presenting  with chief complaints of shortness of breath and altered mental status.  1.  Acute respiratory insufficiency in the setting of acute CHF exacerbation, with underlying COPD -Much improved and responded well to IV diuresis.  Wean off oxygen as tolerated.  Close to baseline now.  2. Acute  on chronic diastolic CHF exacerbation likely due to volume overload/pulmonary edema Responded well to IV diuresis and switched over to oral Lasix today.  Continue metoprolol. - Last Echo on 02/20/2019,  EF 55 to 60%  3. Low back pain-has chronic low back pain. History of L1 kyphoplasty. - MRI of the lumbar spine showing no acute pathology. -Pain medication as tolerated. Lidoderm patch ordered for now  4. AlteredMental status/encephalopathy-patient developed some agitation altered mental status yesterday.  This was likely metabolic in nature related to underlying dementia with sundowning.  Patient received some Haldol and Seroquel at bedtime.  Much improved today. - Avoid benzodiazepines, continue to follow mental status.  5. History of pulmonary embolism - continue Eliquis  6. BPH-follows with urologist as outpatient.  -Continue Proscar, Flomax and no urinary retention.   7. Paroxysmal atrial fibrillation -rate controlled on amiodarone and metoprolol - Continue Eliquis  8.  COPD-no acute exacerbation.  Continue Dulera, Spiriva. - Continue albuterol nebulizers as needed.  Patient likely will need home oxygen upon discharge.  9.  Weakness/frequent falls- Await PT eval as pt. Was encouraged to work with PT today.    10. Hypokalemia - cont. To supplement and repeat in a.m  - Mg. Level was normal.   Discussed plan of care and updated patient's son over the phone.  Also discussed plan of care with care management/social work.  All the records are reviewed and case discussed with Care Management/Social Worker. Management plans discussed with the patient, family and they are in agreement.  CODE STATUS: DNR  DVT Prophylaxis: Eliquis  TOTAL TIME TAKING CARE OF THIS PATIENT: 35 minutes.   POSSIBLE D/C IN 1-2 DAYS, DEPENDING ON CLINICAL CONDITION.   Henreitta Leber M.D on 04/15/2019 at 11:58 AM  Between 7am to 6pm - Pager - [phone]  After 6pm go to [email or web address] -  Proofreader  [facility]  Office  [phone]  CC: Primary care physician; Rusty Aus, MD

## 2019-04-15 NOTE — TOC Initial Note (Signed)
Transition of Care [facility]) - Initial/Assessment Note    Patient Details  Name: Ricardo Erickson MRN: [id number] Date of Birth: [birth year]  Transition of Care [facility]) CM/SW Contact:    Elza Rafter, RN Phone Number: 04/15/2019, 11:26 AM  Clinical Narrative:           Patient has been admitted with fall and back pain.  No fractures.   From [place].  Current with PCP.  Uses a walker at home.  Son states he typically has hospital anxiety.  He has required a Air cabin crew but MD is discontinuing as patient has been appropriate.  This RNCM spoke with son Ricardo Erickson [phone].  Ricardo Erickson states he wants wants best for his dad at DC.  Patient has not worked with PT yet and has anxiety about getting up with PT.  Ricardo Erickson states his dad thinks he broke something though x rays negative.  Ricardo Erickson states if PT recmmends STR that he would like [facility].  If they recommend home with home health; they have used [facility] in the past and would like them again.  This RNCM to patient room and encouraged him to work with PT.  He states he will.  FL2 complete.  Will continue to assist with DC planning.  Geoffry Paradise, BSN, RN Care Manager [phone]            Expected Discharge Plan: Skilled Nursing Facility Barriers to Discharge: Continued Medical Work up   Patient Goals and CMS Choice Patient states their goals for this hospitalization and ongoing recovery are:: To discharge back to [facility].gov Compare Post Acute Care list provided to:: Patient Represenative (must comment)(son [name]) Choice offered to / list presented to : Adult Children, Patient  Expected Discharge Plan and Services Expected Discharge Plan: [place]   Discharge Planning Services: CM Consult Post Acute Care Choice: [place] Living arrangements for the past 2 months: [facility])                                      Prior Living Arrangements/Services Living  arrangements for the past 2 months: [facility]) Lives with:: Self Patient language and need for interpreter reviewed:: Yes Do you feel safe going back to the place where you live?: Yes            Criminal Activity/Legal Involvement Pertinent to Current Situation/Hospitalization: No - Comment as needed  Activities of Daily Living Home Assistive Devices/Equipment: Dentures (specify type), Wheelchair ADL Screening (condition at time of admission) Patient's cognitive ability adequate to safely complete daily activities?: No Is the patient deaf or have difficulty hearing?: No Does the patient have difficulty seeing, even when wearing glasses/contacts?: No Does the patient have difficulty concentrating, remembering, or making decisions?: Yes Patient able to express need for assistance with ADLs?: Yes Does the patient have difficulty dressing or bathing?: Yes Independently performs ADLs?: No Communication: Independent Dressing (OT): Needs assistance Is this a change from baseline?: Pre-admission baseline Grooming: Needs assistance Is this a change from baseline?: Pre-admission baseline Feeding: Independent Bathing: Needs assistance Is this a change from baseline?: Pre-admission baseline Toileting: Needs assistance Is this a change from baseline?: Pre-admission baseline In/Out Bed: Needs assistance Is this a change from baseline?: Pre-admission baseline Walks in Home: Dependent(wheelchair per pt) Is this a change from baseline?: Pre-admission baseline Does the patient have difficulty walking or climbing stairs?:  Yes Weakness of Legs: Both Weakness of Arms/Hands: Both  Permission Sought/Granted Permission sought to share information with : Family Supports Permission granted to share information with : Yes, Verbal Permission Granted        Permission granted to share info w Relationship: Ricardo Erickson [phone]     Emotional Assessment Appearance:: Appears stated  age Attitude/Demeanor/Rapport: Gracious Affect (typically observed): Accepting Orientation: : Oriented to Self, Oriented to  Time, Oriented to Situation Alcohol / Substance Use: Not Applicable    Admission diagnosis:  Shortness of breath [R06.02] Acute respiratory failure with hypoxia (HCC) [J96.01] Generalized weakness [R53.1] Altered mental status, unspecified altered mental status type [R41.82] Acute on chronic congestive heart failure, unspecified heart failure type [facility]) [I50.9] Patient Active Problem List   Diagnosis Date Noted  . Acute exacerbation of congestive heart failure ([place]) 04/12/2019  . Influenza A 02/15/2019  . Pneumonia 11/27/2018  . Pulmonary emboli ([place]) 11/18/2017  . Tobacco use disorder 07/24/2017  . Encephalopathy   . Palliative care encounter   . Goals of care, counseling/discussion   . Pulmonary embolus ([place])   . UTI (urinary tract infection) 07/19/2017  . PTSD (post-traumatic stress disorder) 06/28/2017  . Adjustment disorder with anxiety 06/28/2017  . Closed compression fracture of L5 lumbar vertebra   . Left low back pain   . Weakness   . A-fib ([place]) 06/24/2017  . Prediabetes 01/18/2017  . Prostate cancer ([place]) 01/18/2017  . Anxiety 01/18/2017  . Preventative health care 04/15/2016  . Restless leg syndrome 04/03/2016  . COPD (chronic obstructive pulmonary disease) ([place]) 04/03/2016  . BPH (benign prostatic hyperplasia) 04/03/2016  . Personal history of tobacco use, presenting hazards to health 08/06/2015   PCP:  Rusty Aus, MD Pharmacy:   [facility] [address] [place] - [place] AT [facility] [address] [place] [address] Phone: [phone] Fax: [phone]  [id number] - [place], [place] - [place] AT [address] [place] [address] Phone: [phone] Fax: [phone]     Social Determinants of Health ([place])  Interventions    Readmission Risk Interventions No flowsheet data found.

## 2019-04-15 NOTE — Plan of Care (Signed)
  Problem: Education: Goal: Knowledge of General Education information will improve Description: Including pain rating scale, medication(s)/side effects and non-pharmacologic comfort measures Outcome: Progressing   Problem: Clinical Measurements: Goal: Will remain free from infection Outcome: Progressing Goal: Respiratory complications will improve Outcome: Progressing   Problem: Nutrition: Goal: Adequate nutrition will be maintained Outcome: Progressing   

## 2019-04-15 NOTE — NC FL2 (Signed)
[place] LEVEL OF CARE SCREENING TOOL     IDENTIFICATION  Patient Name: Ricardo Erickson Birthdate: December 28, 1940 Sex: male Admission Date (Current Location): 04/12/2019  [place] and [place] Number:  Engineering geologist and Address:  [facility], [address] [place], [place] [address]      Provider Number: [id number]  Attending Physician Name and Address:  Henreitta Leber, MD  Relative Name and Phone Number:  son [name]    Current Level of Care: Hospital Recommended Level of Care: [place] Prior Approval Number:    Date Approved/Denied:   PASRR Number: [id number]  Discharge Plan: SNF    Current Diagnoses: Patient Active Problem List   Diagnosis Date Noted  . Acute exacerbation of congestive heart failure ([place]) 04/12/2019  . Influenza A 02/15/2019  . Pneumonia 11/27/2018  . Pulmonary emboli ([place]) 11/18/2017  . Tobacco use disorder 07/24/2017  . Encephalopathy   . Palliative care encounter   . Goals of care, counseling/discussion   . Pulmonary embolus ([place])   . UTI (urinary tract infection) 07/19/2017  . PTSD (post-traumatic stress disorder) 06/28/2017  . Adjustment disorder with anxiety 06/28/2017  . Closed compression fracture of L5 lumbar vertebra   . Left low back pain   . Weakness   . A-fib ([place]) 06/24/2017  . Prediabetes 01/18/2017  . Prostate cancer ([place]) 01/18/2017  . Anxiety 01/18/2017  . Preventative health care 04/15/2016  . Restless leg syndrome 04/03/2016  . COPD (chronic obstructive pulmonary disease) ([place]) 04/03/2016  . BPH (benign prostatic hyperplasia) 04/03/2016  . Personal history of tobacco use, presenting hazards to health 08/06/2015    Orientation RESPIRATION BLADDER Height & Weight     Self, Situation, Place  Normal Incontinent Weight: 83.7 kg Height:  5\' 8"  (172.7 cm)  BEHAVIORAL SYMPTOMS/MOOD NEUROLOGICAL BOWEL NUTRITION STATUS      Incontinent Diet(2gram sodium)   AMBULATORY STATUS COMMUNICATION OF NEEDS Skin   Limited Assist Verbally Normal                       Personal Care Assistance Level of Assistance  Bathing, Feeding, Dressing Bathing Assistance: Limited assistance Feeding assistance: Independent Dressing Assistance: Limited assistance     Functional Limitations Info  Sight, Hearing, Speech Sight Info: Adequate Hearing Info: Adequate Speech Info: Adequate    SPECIAL CARE FACTORS FREQUENCY  PT (By licensed PT)     PT Frequency: 5 X per week              Contractures Contractures Info: Not present    Additional Factors Info  Code Status, Allergies, Psychotropic Code Status Info: DNR Allergies Info: Ativan           Current Medications (04/15/2019):  This is the current hospital active medication list Current Facility-Administered Medications  Medication Dose Route Frequency Provider Last Rate Last Dose  . acetaminophen (TYLENOL) tablet 650 mg  650 mg Oral Q4H PRN Lang Snow, NP      . albuterol (PROVENTIL) (2.5 MG/3ML) 0.083% nebulizer solution 2.5 mg  2.5 mg Inhalation Q4H PRN Lang Snow, NP      . albuterol (PROVENTIL) (2.5 MG/3ML) 0.083% nebulizer solution 2.5 mg  2.5 mg Inhalation TID Henreitta Leber, MD   2.5 mg at 04/15/19 0755  . amiodarone (PACERONE) tablet 200 mg  200 mg Oral Daily Lang Snow, NP   200 mg at 04/15/19 1044  . apixaban (ELIQUIS) tablet 5 mg  5  mg Oral BID Lang Snow, NP   5 mg at 04/15/19 1032  . docusate sodium (COLACE) capsule 100 mg  100 mg Oral BID Lang Snow, NP   100 mg at 04/15/19 1032  . feeding supplement (ENSURE ENLIVE) (ENSURE ENLIVE) liquid 237 mL  237 mL Oral BID BM Henreitta Leber, MD   237 mL at 04/15/19 1033  . finasteride (PROSCAR) tablet 5 mg  5 mg Oral Daily Lang Snow, NP   5 mg at 04/15/19 1032  . FLUoxetine (PROZAC) capsule 10 mg  10 mg Oral Daily Lang Snow, NP   10 mg at  04/15/19 1033  . furosemide (LASIX) tablet 20 mg  20 mg Oral Burnadette Pop, MD   20 mg at 04/15/19 1032  . lidocaine (LIDODERM) 5 % 1 patch  1 patch Transdermal Q24H Lang Snow, NP   1 patch at 04/14/19 2112  . metoprolol tartrate (LOPRESSOR) tablet 50 mg  50 mg Oral BID Lang Snow, NP   50 mg at 04/15/19 1043  . mometasone-formoterol (DULERA) 200-5 MCG/ACT inhaler 2 puff  2 puff Inhalation BID Lang Snow, NP   2 puff at 04/15/19 1032  . multivitamin with minerals tablet 1 tablet  1 tablet Oral Daily Henreitta Leber, MD   1 tablet at 04/15/19 1032  . nicotine (NICODERM CQ - dosed in mg/24 hours) patch 21 mg  21 mg Transdermal Daily Henreitta Leber, MD   21 mg at 04/15/19 1045  . OLANZapine (ZYPREXA) tablet 2.5 mg  2.5 mg Oral QHS Harrie Foreman, MD      . ondansetron [facility]) injection 4 mg  4 mg Intravenous Q6H PRN Lang Snow, NP      . potassium chloride (K-DUR) CR tablet 10 mEq  10 mEq Oral Daily Lang Snow, NP   10 mEq at 04/15/19 1032  . pramipexole (MIRAPEX) tablet 1 mg  1 mg Oral QHS Lang Snow, NP   1 mg at 04/14/19 2116  . QUEtiapine (SEROQUEL) tablet 25 mg  25 mg Oral QHS Sainani, Vivek J, MD      . sodium chloride flush (NS) 0.9 % injection 3 mL  3 mL Intravenous Q12H Lang Snow, NP   3 mL at 04/14/19 0906  . tamsulosin (FLOMAX) capsule 0.4 mg  0.4 mg Oral QPC supper Lang Snow, NP   0.4 mg at 04/13/19 1733  . tiotropium (SPIRIVA) inhalation capsule ([facility] use ONLY) 18 mcg  18 mcg Inhalation Daily Lang Snow, NP   18 mcg at 04/15/19 1033  . traMADol (ULTRAM) tablet 50 mg  50 mg Oral BID Lang Snow, NP   50 mg at 04/14/19 2115     Discharge Medications: Please see discharge summary for a list of discharge medications.  Relevant Imaging Results:  Relevant Lab Results:   Additional Information SSN [id number]  Elza Rafter,  RN

## 2019-04-15 NOTE — Progress Notes (Addendum)
Patient more alert and oriented today. Alert and oriented x4. 1:1 sitter discontinued and telesitter placed. Patient has not been impulsive or combative and is able to follow commands. Telesiiter called in for report. Explained to patient what the device is and how it works. Patient's diet also changed to soft because he is missing his bottom denture. Will handoff to appropriate staff to keep an eye out for his denture. Will continue to monitor patient.   Update 1422: patient still alert and oriented x4. No complaints. Will continue to monitor.   Update 1728: Patient stating he has restless leg syndrome and usually takes Requip for it. Dr. Verdell Carmine notified. Will await for new orders.

## 2019-04-15 NOTE — Progress Notes (Signed)
Physical Therapy Evaluation Patient Details Name: BURLIE CAJAMARCA MRN: [id number] DOB: 1941/01/23 Today's Date: 04/15/2019   History of Present Illness  Maximus Hoffert is a 78 y.o. male who was brought to hospital ED by EMS from [facility] on 04/12/2019 with shortness of breath and AMS. He is a poor historian with a history of dementia. He was admitted to the hospital with diagnosis of acute respiratory insufficienciy in the setting of acute CHF exacerbation, acute on chronic diastolic CHF exacerbation, low back pain. He has a known history of pulmonary embolism, atrial fibrillation on Eliquis, CHF, chronic back pain from chronic compression fracture status post kyphoplasty, dementia, hypertension, tobacco use disorder, BPH, paroxysmal atrial fibrillation, and recurrent falls Lumbar imaging reveals chronic compression fx at T10, L1, L2 (severe/new since 03/08/19), L5. Superior endplate fracture at L4 (new since 03/08/19).    Clinical Impression  Patient performs bed mobility with mod to max assist for supine to sit and sit to supine. Patient has poor sitting posture and poor sitting balance and poor sitting tolerance. He performs sit to stand transfer with mod assist and elevated hospital bed with RW and poor standing posture and poor standing balance. He is not able to ambulate due to weakness and poor posture and poor balance.Patient has decreased UE and LE strength and decreased trunk strength.  He will benefit from skilled PT to improve strength and mobility.     Follow Up Recommendations      Equipment Recommendations       Recommendations for Other Services       Precautions / Restrictions Restrictions Weight Bearing Restrictions: No      Mobility  Bed Mobility Overal bed mobility: Needs Assistance(unable to sit straight up, leans to the left) Bed Mobility: Supine to Sit;Sit to Supine     Supine to sit: Mod assist Sit to supine: Mod assist   General bed mobility comments:  unable to raise his truk up   Transfers Overall transfer level: Needs assistance Equipment used: Rolling walker (2 wheeled)             General transfer comment: (Patient stands with RW and trunk flex for 30 sec)  Ambulation/Gait Ambulation/Gait assistance: (unable)              Stairs            Wheelchair Mobility    Modified Rankin (Stroke Patients Only)       Balance Overall balance assessment: Needs assistance;History of Falls Sitting-balance support: Bilateral upper extremity supported;Feet supported Sitting balance-Leahy Scale: Poor     Standing balance support: Bilateral upper extremity supported Standing balance-Leahy Scale: Poor                               Pertinent Vitals/Pain Pain Assessment: No/denies pain    Home Living                        Prior Function Level of Independence: Independent with assistive device(s)               Hand Dominance        Extremity/Trunk Assessment   Upper Extremity Assessment Upper Extremity Assessment: Generalized weakness    Lower Extremity Assessment Lower Extremity Assessment: Generalized weakness    Cervical / Trunk Assessment Cervical / Trunk Assessment: Kyphotic  Communication      Cognition Arousal/Alertness: Awake/alert Behavior During Therapy: [facility]  for tasks assessed/performed;Impulsive Overall Cognitive Status: History of cognitive impairments - at baseline                                        General Comments      Exercises     Assessment/Plan    PT Assessment Patient needs continued PT services  PT Problem List Decreased strength;Decreased activity tolerance;Decreased balance;Decreased mobility;Decreased safety awareness       PT Treatment Interventions Gait training;Therapeutic activities;Therapeutic exercise;Balance training    PT Goals (Current goals can be found in the Care Plan section)  Acute Rehab PT  Goals Patient Stated Goal: (to walk) PT Goal Formulation: With patient Time For Goal Achievement: 04/29/19 Potential to Achieve Goals: Poor    Frequency Min 2X/week   Barriers to discharge        Co-evaluation               AM-PAC PT "6 Clicks" Mobility  Outcome Measure Help needed turning from your back to your side while in a flat bed without using bedrails?: A Lot Help needed moving from lying on your back to sitting on the side of a flat bed without using bedrails?: Total Help needed moving to and from a bed to a chair (including a wheelchair)?: Total Help needed standing up from a chair using your arms (e.g., wheelchair or bedside chair)?: Total Help needed to walk in hospital room?: Total Help needed climbing 3-5 steps with a railing? : Total 6 Click Score: 7    End of Session Equipment Utilized During Treatment: Gait belt Activity Tolerance: Patient limited by fatigue Patient left: in bed;with bed alarm set   PT Visit Diagnosis: Other abnormalities of gait and mobility (R26.89);Muscle weakness (generalized) (M62.81);Difficulty in walking, not elsewhere classified (R26.2)    Time: 1330-1400 PT Time Calculation (min) (ACUTE ONLY): 30 min   Charges:   PT Evaluation $PT Eval Low Complexity: 1 Low PT Treatments $Therapeutic Activity: 23-37 mins         Alanson Puls, PT DPT 04/15/2019, 3:18 PM

## 2019-04-15 NOTE — Plan of Care (Signed)
  Problem: Education: Goal: Knowledge of General Education information will improve Description Including pain rating scale, medication(s)/side effects and non-pharmacologic comfort measures Outcome: Progressing   Problem: Clinical Measurements: Goal: Will remain free from infection Outcome: Progressing Goal: Respiratory complications will improve Outcome: Progressing   Problem: Nutrition: Goal: Adequate nutrition will be maintained Outcome: Progressing   Problem: Coping: Goal: Level of anxiety will decrease Outcome: Progressing   Problem: Elimination: Goal: Will not experience complications related to bowel motility Outcome: Progressing

## 2019-04-15 NOTE — Plan of Care (Signed)
Patient was irritable and impulsive in the beginning of the shift. Now he is resting comfortably.

## 2019-04-16 LAB — CBC
HCT: 50.4 % (ref 39.0–52.0)
Hemoglobin: 16.2 g/dL (ref 13.0–17.0)
MCH: 29.8 pg (ref 26.0–34.0)
MCHC: 32.1 g/dL (ref 30.0–36.0)
MCV: 92.6 fL (ref 80.0–100.0)
Platelets: 186 10*3/uL (ref 150–400)
RBC: 5.44 MIL/uL (ref 4.22–5.81)
RDW: 15.3 % (ref 11.5–15.5)
WBC: 10.4 10*3/uL (ref 4.0–10.5)
nRBC: 0 % (ref 0.0–0.2)

## 2019-04-16 LAB — CREATININE, SERUM
Creatinine, Ser: 0.68 mg/dL (ref 0.61–1.24)
GFR calc Af Amer: >60 mL/min (ref >60)
GFR calc non Af Amer: >60 mL/min (ref >60)

## 2019-04-16 LAB — NOVEL CORONAVIRUS, NAA (HOSP ORDER, SEND-OUT TO REF LAB; TAT 18-24 HRS): SARS-CoV-2, NAA: NOT DETECTED

## 2019-04-16 LAB — POTASSIUM: Potassium: 3.6 mmol/L (ref 3.5–5.1)

## 2019-04-16 MED ORDER — ROPINIROLE HCL 0.25 MG PO TABS: 0.2500 mg | ORAL_TABLET | Freq: Every day | ORAL | Status: DC

## 2019-04-16 NOTE — Progress Notes (Addendum)
[facility] [place] NAME: Ricardo Erickson    MR#:  [id number]  DATE OF BIRTH:  Oct 19, 1941  SUBJECTIVE:   Patient slept better overnight, no acute events overnight.  Awaiting physical therapy evaluation.  REVIEW OF SYSTEMS:    Review of Systems  Constitutional: Negative for chills and fever.  HENT: Negative for congestion and tinnitus.   Eyes: Negative for blurred vision and double vision.  Respiratory: Negative for cough, shortness of breath and wheezing.   Cardiovascular: Negative for chest pain, orthopnea, leg swelling and PND.  Gastrointestinal: Negative for abdominal pain, diarrhea, nausea and vomiting.  Genitourinary: Negative for dysuria and hematuria.  Musculoskeletal: Positive for back pain and joint pain (right hip).  Neurological: Negative for dizziness, sensory change and focal weakness.  All other systems reviewed and are negative.    Nutrition: Heart Healthy/Carb modified Tolerating Diet: Yes Tolerating PT: Await Eval.   DRUG ALLERGIES:   Allergies  Allergen Reactions  . Ativan [Lorazepam] Other (See Comments)    Patient gets severe altered mental status and confusion when taking this medicine    VITALS:  Blood pressure (!) 119/93, pulse 98, temperature 98.4 F (36.9 C), temperature source Oral, resp. rate 18, height 5\' 8"  (1.727 m), weight 87.1 kg, SpO2 93 %.  PHYSICAL EXAMINATION:   Physical Exam  GENERAL:  78 y.o.-year-old patient lying in bed in no acute distress.  EYES: Pupils equal, round, reactive to light and accommodation. No scleral icterus. Extraocular muscles intact.  HEENT: Head atraumatic, normocephalic. Oropharynx and nasopharynx clear.  NECK:  Supple, no jugular venous distention. No thyroid enlargement, no tenderness.  LUNGS: Normal breath sounds bilaterally, no wheezing, No rales, No rhonchi. No use of accessory muscles of respiration.  CARDIOVASCULAR: S1, S2 normal. No murmurs, rubs, or  gallops.  ABDOMEN: Soft, nontender, nondistended. Bowel sounds present. No organomegaly or mass.  EXTREMITIES: No cyanosis, clubbing or edema b/l.    NEUROLOGIC: Cranial nerves II through XII are intact. No focal Motor or sensory deficits b/l. Globally weak and deconditioned. PSYCHIATRIC: The patient is alert and oriented x 3.  SKIN: No obvious rash, lesion, or ulcer.    LABORATORY PANEL:   CBC Recent Labs  Lab 04/16/19 0516  WBC 10.4  HGB 16.2  HCT 50.4  PLT 186   ------------------------------------------------------------------------------------------------------------------  Chemistries  Recent Labs  Lab 04/12/19 1204 04/14/19 0504 04/15/19 0401 04/16/19 0516  NA 138 140  --   --   K 4.1 3.2* 3.4* 3.6  CL 103 103  --   --   CO2 26 27  --   --   GLUCOSE 93 109*  --   --   BUN 13 13  --   --   CREATININE 0.81 0.71  --  0.68  CALCIUM 8.6* 8.6*  --   --   MG  --   --  2.0  --   AST 20  --   --   --   ALT 7  --   --   --   ALKPHOS 86  --   --   --   BILITOT 1.6*  --   --   --    ------------------------------------------------------------------------------------------------------------------  Cardiac Enzymes Recent Labs  Lab 04/12/19 1204  TROPONINI <0.03   ------------------------------------------------------------------------------------------------------------------  RADIOLOGY:  No results found.   ASSESSMENT AND PLAN:   78 y.o. male with a known history of pulmonary embolism, atrial fibrillation on Eliquis, CHF, chronic back pain  from chronic compression fracture status post kyphoplasty, dementia, hypertension, and recurrent falls presenting  with chief complaints of shortness of breath and altered mental status.  1.  Acute respiratory insufficiency in the setting of acute CHF exacerbation, with underlying COPD -Much improved and responded well to IV diuresis.   2. Acute on chronic diastolic CHF exacerbation likely due to volume overload/pulmonary  edema Responded well to IV diuresis and switched over to oral Lasix yesterday.  -   Continue metoprolol. - Last Echo on 02/20/2019,  EF 55 to 60%  3. Low back pain-has chronic low back pain. History of L1 kyphoplasty. - MRI of the lumbar spine showing no acute pathology. -Pain medication as tolerated. Lidoderm patch ordered for now  4. AlteredMental status/encephalopathy-patient developed some agitation altered mental status 2 days ago.  -  This was likely metabolic in nature related to underlying dementia with sundowning.  Patient received some Haldol and Seroquel at bedtime.  Much improved and close to baseline now. - Avoid benzodiazepines, continue to follow mental status.  5. History of pulmonary embolism - continue Eliquis  6. BPH-follows with urologist as outpatient.  -Continue Proscar, Flomax and no urinary retention.   7. Paroxysmal atrial fibrillation -rate controlled on amiodarone and metoprolol - Continue Eliquis  8.  COPD-no acute exacerbation.  Continue Dulera, Spiriva. - Continue albuterol nebulizers as needed.  Patient likely will need home oxygen upon discharge.  9.  Weakness/frequent falls- due to significant weakness and deconditioning.  Await physical therapy evaluation.  10. Hypokalemia -improved and resolved with supplementation.  11. Restless Leg Syndrome - cont. Mirapex.   Await PT eval. COVID-19 test is pending.    All the records are reviewed and case discussed with Care Management/Social Worker. Management plans discussed with the patient, family and they are in agreement.  CODE STATUS: DNR  DVT Prophylaxis: Eliquis  TOTAL TIME TAKING CARE OF THIS PATIENT: 30 minutes.   POSSIBLE D/C IN 1-2 DAYS, DEPENDING ON CLINICAL CONDITION.   Henreitta Leber M.D on 04/16/2019 at 1:08 PM  Between 7am to 6pm - Pager - [phone]  After 6pm go to [email or web address] - Proofreader  [facility]  Office  [phone]   CC: Primary care physician; Rusty Aus, MD

## 2019-04-16 NOTE — Progress Notes (Signed)
Physical Therapy Treatment Patient Details Name: Ricardo Erickson MRN: [id number] DOB: 02/18/1941 Today's Date: 04/16/2019    History of Present Illness Ricardo Erickson is a 78 y.o. male who was brought to hospital ED by EMS from [facility] on 04/12/2019 with shortness of breath and AMS. He is a poor historian with a history of dementia. He was admitted to the hospital with diagnosis of acute respiratory insufficienciy in the setting of acute CHF exacerbation, acute on chronic diastolic CHF exacerbation, low back pain. He has a known history of pulmonary embolism, atrial fibrillation on Eliquis, CHF, chronic back pain from chronic compression fracture status post kyphoplasty, dementia, hypertension, tobacco use disorder, BPH, paroxysmal atrial fibrillation, and recurrent falls Lumbar imaging reveals chronic compression fx at T10, L1, L2 (severe/new since 03/08/19), L5. Superior endplate fracture at L4 (new since 03/08/19).      PT Comments    Patient progressing fair. Able to transfer to bedside chair with mod A +2, he exhibits heavy forward flexed posture with severe thoracic kyphosis and forward flexed head. Patient expressed increased back pain with movement but no pain at rest; He was instructed in LE strengthening/flexibility exercise in chair. Utilized teachback method for better HEP adherence. Patient would benefit from additional skilled PT Intervention to improve strength, balance and gait safety; Patient left in chair with chair alarm on and needs in reach; RN informed;    Follow Up Recommendations  SNF;Supervision/Assistance - 24 hour     Equipment Recommendations  None recommended by PT    Recommendations for Other Services       Precautions / Restrictions Precautions Precautions: Fall Restrictions Weight Bearing Restrictions: No    Mobility  Bed Mobility Overal bed mobility: Needs Assistance Bed Mobility: Supine to Sit     Supine to sit: Mod assist     General bed  mobility comments: elevated head of bed; requires mod A +1 with increased time to sit edge of bed with severe forward flexed posture and kyphotic curve; patient uses bed rail to hold self up in semi-neutral position; short of breath and fatigued with sitting edge of bed; able to maintain for approximately 5 min during chair/room set up to prepare to transfer;   Transfers Overall transfer level: Needs assistance Equipment used: Rolling walker (2 wheeled) Transfers: Sit to/from [facility] Sit to Stand: Mod assist;From elevated surface Stand pivot transfers: Mod assist;+2 physical assistance;From elevated surface       General transfer comment: patient transferred sit<>Stand from elevated bed x1 rep with mod A +1 with RW, mod VCs for hand placement and body position; unable to hold >1-2 sec; patient was +2 mod A for stand pivot transfer to bedside chair with RW with mod VCs for hand/foot placement and positioning; Patient required increased time and cues for safety and encouragement to participate.   Ambulation/Gait Ambulation/Gait assistance: Mod assist Gait Distance (Feet): 2 Feet Assistive device: Rolling walker (2 wheeled) Gait Pattern/deviations: Step-to pattern;Decreased step length - right;Decreased step length - left;Decreased dorsiflexion - right;Decreased dorsiflexion - left;Trunk flexed;Narrow base of support Gait velocity: decreased   General Gait Details: took 2 steps to bedside chair, mod A +2 physical assist with mod VCs for walker placement and positioning; complains of increased BLE weakness and thigh pain upon standing with increased fear of falling;    Stairs             Wheelchair Mobility    Modified Rankin (Stroke Patients Only)  Balance Overall balance assessment: Needs assistance Sitting-balance support: Bilateral upper extremity supported;Feet supported Sitting balance-Leahy Scale: Poor Sitting balance - Comments: heavy kyphotic  curve with flexed posture; left lateral lean;  Postural control: Left lateral lean Standing balance support: Bilateral upper extremity supported Standing balance-Leahy Scale: Poor Standing balance comment: uses RW with mod A +2 for standing balance;                             Cognition Arousal/Alertness: Awake/alert Behavior During Therapy: WFL for tasks assessed/performed;Impulsive Overall Cognitive Status: History of cognitive impairments - at baseline                                        Exercises Other Exercises Other Exercises: Instructed patient in sitting LE strengthening exercise: long sitting: ankle pump, quad sets, gluteal squeeze, all x10 bilaterally; utilized teachback method for patient understanding, He was able to verbalize and teachback LE exercise. Required min VCs to slow down LE movement and increase ROM for better strengthening and flexibility with exercise x8 min;     General Comments        Pertinent Vitals/Pain Pain Assessment: No/denies pain(at rest; 6/10 back pain with movement; )    Home Living                      Prior Function            PT Goals (current goals can now be found in the care plan section) Acute Rehab PT Goals Patient Stated Goal: to walk(to walk) PT Goal Formulation: With patient Time For Goal Achievement: 04/29/19 Potential to Achieve Goals: Poor Progress towards PT goals: Progressing toward goals    Frequency    Min 2X/week      PT Plan      Co-evaluation              AM-PAC PT "6 Clicks" Mobility   Outcome Measure  Help needed turning from your back to your side while in a flat bed without using bedrails?: A Lot Help needed moving from lying on your back to sitting on the side of a flat bed without using bedrails?: Total Help needed moving to and from a bed to a chair (including a wheelchair)?: A Lot Help needed standing up from a chair using your arms (e.g., wheelchair  or bedside chair)?: A Lot Help needed to walk in hospital room?: Total Help needed climbing 3-5 steps with a railing? : Total 6 Click Score: 9    End of Session Equipment Utilized During Treatment: Gait belt Activity Tolerance: Patient limited by fatigue Patient left: in chair;with call bell/phone within reach;with chair alarm set Nurse Communication: Mobility status PT Visit Diagnosis: Other abnormalities of gait and mobility (R26.89);Muscle weakness (generalized) (M62.81);Difficulty in walking, not elsewhere classified (R26.2)     Time: 1010-1037 PT Time Calculation (min) (ACUTE ONLY): 27 min  Charges:  $Therapeutic Exercise: 8-22 mins $Therapeutic Activity: 8-22 mins                        Izzabelle Bouley PT, DPT 04/16/2019, 11:08 AM

## 2019-04-16 NOTE — Progress Notes (Signed)
Pt working with PT, and sitting up in chair.  Pt has demonstrable weakness, and requires 1-2 person assist with transferring.  Pt's son, [name], updated on pt's progress and discharge plans.

## 2019-04-16 NOTE — Plan of Care (Signed)

## 2019-04-17 MED ORDER — TRAMADOL HCL 50 MG PO TABS: 25.0000 mg | ORAL_TABLET | Freq: Two times a day (BID) | ORAL | 0 refills | Status: DC

## 2019-04-17 NOTE — Care Management Important Message (Signed)
Important Message  Patient Details  Name: Ricardo Erickson MRN: [id number] Date of Birth: [birth year]   Medicare Important Message Given:  Yes    Dannette Barbara 04/17/2019, 10:37 AM

## 2019-04-17 NOTE — Progress Notes (Signed)
Report given to Mia at [facility]. EMS called to transport patient.

## 2019-04-17 NOTE — Discharge Summary (Signed)
[facility] [place] NAME: Ricardo Erickson    MR#:  [id number]  DATE OF BIRTH:  05-08-1941  DATE OF ADMISSION:  04/12/2019 ADMITTING PHYSICIAN: Fritzi Mandes, MD  DATE OF DISCHARGE: 04/17/2019  PRIMARY CARE PHYSICIAN: Rusty Aus, MD    ADMISSION DIAGNOSIS:  Shortness of breath [R06.02] Acute respiratory failure with hypoxia (HCC) [J96.01] Generalized weakness [R53.1] Altered mental status, unspecified altered mental status type [R41.82] Acute on chronic congestive heart failure, unspecified heart failure type ([place]) [I50.9]  DISCHARGE DIAGNOSIS:  Active Problems:   Acute exacerbation of congestive heart failure ([place])   SECONDARY DIAGNOSIS:   Past Medical History:  Diagnosis Date  . CHF (congestive heart failure) ([place])   . Chronic back pain   . Dementia ([place])   . Depression   . Emphysema lung ([place])   . Hypertension   . Personal history of tobacco use, presenting hazards to health 08/06/2015  . Pollen allergies   . PTSD (post-traumatic stress disorder)   . Pulmonary embolism [facility])     HOSPITAL COURSE:   78 y.o.malewith a known history ofpulmonary embolism, atrial fibrillation on Eliquis, CHF, chronic back pain from chronic compression fracture status post kyphoplasty,dementia, hypertension, andrecurrent fallspresentingwith chief complaints of shortness of breath and altered mental status.  1. Acute respiratory insufficiency in the setting of acute CHF exacerbation, with underlying COPD -Patient was diuresed with IV Lasix and has clinically improved.  He is now being discharged on his oral Lasix and has been weaned off oxygen.  2.Acute on chronicdiastolicCHF exacerbation likely due to volumeoverload/pulmonary edema Responded well to IV diuresis and mch better and switched back to his home dose oral lasix and will cont. That.  -   Continue metoprolol. -Last [place] 02/20/2019,EF 55 to 60%  3. Low back pain-has  chronic low back pain. History of L1 kyphoplasty. - MRI of the lumbar spine showing no acute pathology. - pt. Will cont. Lidoderm patch ordered for now  4. AlteredMental status/encephalopathy- due to sundowning/polypharmacy. -Much improved.  Mental status back to baseline now patient was given some Haldol and some Seroquel and continued on his Zyprexa.    5. History ofpulmonary embolism - pt. Will continue Eliquis  6. BPH-follows with urologist as outpatient. -Continue Proscar, Flomax and no urinary retention.   7.Paroxysmal atrial fibrillation-rate controlled and pt. Will cont. amiodarone and metoprolol -pt. WillContinue Eliquis  8.  COPD-no acute exacerbation.  pt. Will Continue Advair, Spiriva.  - pt. Will albuterol inhaler as needed.  - cont. O2.  9.  Weakness/frequent falls- due to significant weakness and deconditioning. Seen by PT and they recommend SNF/STR and pt. Will be discharged there today.   10. Hypokalemia -improved and resolved with supplementation.  11. Restless Leg Syndrome - pt. Will  cont. Mirapex.   DISCHARGE CONDITIONS:   Stable.   CONSULTS OBTAINED:    DRUG ALLERGIES:   Allergies  Allergen Reactions  . Ativan [Lorazepam] Other (See Comments)    Patient gets severe altered mental status and confusion when taking this medicine    DISCHARGE MEDICATIONS:   Allergies as of 04/17/2019      Reactions   Ativan [lorazepam] Other (See Comments)   Patient gets severe altered mental status and confusion when taking this medicine      Medication List    STOP taking these medications   nicotine 14 mg/24hr patch Commonly known as:  NICODERM CQ - dosed in mg/24 hours     TAKE these medications  acetaminophen 650 MG CR tablet Commonly known as:  TYLENOL Take 650 mg by mouth every 8 (eight) hours as needed for pain.   Advair Diskus 250-50 MCG/DOSE Aepb Generic drug:  Fluticasone-Salmeterol Inhale 1 puff into the lungs every 12  (twelve) hours.   albuterol 108 (90 Base) MCG/ACT inhaler Commonly known as:  VENTOLIN HFA Inhale 2 puffs into the lungs every 4 (four) hours as needed for wheezing or shortness of breath.   albuterol (2.5 MG/3ML) 0.083% nebulizer solution Commonly known as:  PROVENTIL Inhale 2.5 mg into the lungs 4 (four) times daily.   amiodarone 200 MG tablet Commonly known as:  PACERONE Two tabs po daily for three more days then take one tablet daily afterwards What changed:    how much to take  how to take this  when to take this   apixaban 5 MG Tabs tablet Commonly known as:  ELIQUIS Take 1 tablet (5 mg total) by mouth 2 (two) times daily.   docusate sodium 100 MG capsule Commonly known as:  Colace Take 1 capsule (100 mg total) by mouth 2 (two) times daily.   finasteride 5 MG tablet Commonly known as:  Proscar Take 1 tablet (5 mg total) by mouth daily.   FLUoxetine 10 MG capsule Commonly known as:  PROZAC Take 10 mg by mouth daily.   furosemide 20 MG tablet Commonly known as:  LASIX Take 20 mg by mouth every morning.   lidocaine 5 % Commonly known as:  LIDODERM Place 1 patch onto the skin daily. Remove & Discard patch within 12 hours or as directed by MD   memantine 5 MG tablet Commonly known as:  NAMENDA Take 1 tablet (5 mg total) by mouth 2 (two) times daily.   metoprolol tartrate 50 MG tablet Commonly known as:  LOPRESSOR Take 1 tablet (50 mg total) by mouth 2 (two) times daily.   OLANZapine 2.5 MG tablet Commonly known as:  ZYPREXA Take 1.25 mg by mouth at bedtime.   potassium chloride 10 MEQ tablet Commonly known as:  K-DUR Take 10 mEq by mouth daily.   pramipexole 1 MG tablet Commonly known as:  MIRAPEX Take 1 tablet (1 mg total) by mouth at bedtime.   Spiriva HandiHaler 18 MCG inhalation capsule Generic drug:  tiotropium Place 18 mcg into inhaler and inhale daily.   tamsulosin 0.4 MG Caps capsule Commonly known as:  FLOMAX Take 2 capsules (0.8 mg  total) by mouth daily after supper. What changed:  how much to take   traMADol 50 MG tablet Commonly known as:  Ultram Take 0.5 tablets (25 mg total) by mouth 2 (two) times daily. What changed:  how much to take         DISCHARGE INSTRUCTIONS:   DIET:  Cardiac diet  DISCHARGE CONDITION:  Stable  ACTIVITY:  Activity as tolerated  OXYGEN:  Home Oxygen: No.   Oxygen Delivery: room air  DISCHARGE LOCATION:  nursing home   If you experience worsening of your admission symptoms, develop shortness of breath, life threatening emergency, suicidal or homicidal thoughts you must seek medical attention immediately by calling 911 or calling your MD immediately  if symptoms less severe.  You Must read complete instructions/literature along with all the possible adverse reactions/side effects for all the Medicines you take and that have been prescribed to you. Take any new Medicines after you have completely understood and accpet all the possible adverse reactions/side effects.   Please note  You were cared for by  a hospitalist during your hospital stay. If you have any questions about your discharge medications or the care you received while you were in the hospital after you are discharged, you can call the unit and asked to speak with the hospitalist on call if the hospitalist that took care of you is not available. Once you are discharged, your primary care physician will handle any further medical issues. Please note that NO REFILLS for any discharge medications will be authorized once you are discharged, as it is imperative that you return to your primary care physician (or establish a relationship with a primary care physician if you do not have one) for your aftercare needs so that they can reassess your need for medications and monitor your lab values.     Today   No acute events overnight, slept better.  Will d/c to SNF today.    VITAL SIGNS:  Blood pressure (!) 159/73,  pulse 65, temperature 98.5 F (36.9 C), temperature source Oral, resp. rate 19, height 5\' 8"  (1.727 m), weight 89.6 kg, SpO2 94 %.  I/O:    Intake/Output Summary (Last 24 hours) at 04/17/2019 1257 Last data filed at 04/16/2019 1340 Gross per 24 hour  Intake 360 ml  Output -  Net 360 ml    PHYSICAL EXAMINATION:   GENERAL:  78 y.o.-year-old obese patient lying in bed in no acute distress.  EYES: Pupils equal, round, reactive to light and accommodation. No scleral icterus. Extraocular muscles intact.  HEENT: Head atraumatic, normocephalic. Oropharynx and nasopharynx clear.  NECK:  Supple, no jugular venous distention. No thyroid enlargement, no tenderness.  LUNGS: Normal breath sounds bilaterally, no wheezing, No rales, No rhonchi. No use of accessory muscles of respiration.  CARDIOVASCULAR: S1, S2 normal. No murmurs, rubs, or gallops.  ABDOMEN: Soft, nontender, nondistended. Bowel sounds present. No organomegaly or mass.  EXTREMITIES: No cyanosis, clubbing or edema b/l.    NEUROLOGIC: Cranial nerves II through XII are intact. No focal Motor or sensory deficits b/l. Globally weak and deconditioned. PSYCHIATRIC: The patient is alert and oriented x 3.  SKIN: No obvious rash, lesion, or ulcer.   DATA REVIEW:   CBC Recent Labs  Lab 04/16/19 0516  WBC 10.4  HGB 16.2  HCT 50.4  PLT 186    Chemistries  Recent Labs  Lab 04/12/19 1204 04/14/19 0504 04/15/19 0401 04/16/19 0516  NA 138 140  --   --   K 4.1 3.2* 3.4* 3.6  CL 103 103  --   --   CO2 26 27  --   --   GLUCOSE 93 109*  --   --   BUN 13 13  --   --   CREATININE 0.81 0.71  --  0.68  CALCIUM 8.6* 8.6*  --   --   MG  --   --  2.0  --   AST 20  --   --   --   ALT 7  --   --   --   ALKPHOS 86  --   --   --   BILITOT 1.6*  --   --   --     Cardiac Enzymes Recent Labs  Lab 04/12/19 [place] <0.03    Microbiology Results  Results for orders placed or performed during the hospital encounter of 04/12/19   MRSA PCR Screening     Status: None   Collection Time: 04/14/19  3:19 AM  Result Value Ref Range Status   MRSA  by PCR NEGATIVE NEGATIVE Final    Comment:        The GeneXpert MRSA Assay (FDA approved for NASAL specimens only), is one component of a comprehensive MRSA colonization surveillance program. It is not intended to diagnose MRSA infection nor to guide or monitor treatment for MRSA infections. Performed at [facility], [address] [place], [place] [address]   Novel Coronavirus, NAA (hospital order; send-out to ref lab)     Status: None   Collection Time: 04/15/19 10:50 AM  Result Value Ref Range Status   SARS-CoV-2, NAA NOT DETECTED NOT DETECTED Final    Comment: (NOTE) This test was developed and its performance characteristics determined by [employer]. This test has not been FDA cleared or approved. This test has been authorized by FDA under an Emergency Use Authorization (EUA). This test is only authorized for the duration of time the declaration that circumstances exist justifying the authorization of the emergency use of in vitro diagnostic tests for detection of SARS-CoV-2 virus and/or diagnosis of COVID-19 infection under section 564(b)(1) of the Act, 21 U.S.C. 709GGE-3(M)(6), unless the authorization is terminated or revoked sooner. When diagnostic testing is negative, the possibility of a false negative result should be considered in the context of a patient's recent exposures and the presence of clinical signs and symptoms consistent with COVID-19. An individual without symptoms of COVID-19 and who is not shedding SARS-CoV-2 virus would expect to have a negative (not detected) result in this assay. Performed  At: [facility] [place], [place] [id number] Rush Farmer MD KP:[id number]    [place]  Final    Comment: Performed at [facility], [address] [place],  [place] [address]    RADIOLOGY:  No results found.    Management plans discussed with the patient, family and they are in agreement.  CODE STATUS:     Code Status Orders  (From admission, onward)         Start     Ordered   04/12/19 2108  Do not attempt resuscitation (DNR)  Continuous    Question Answer Comment  In the event of cardiac or respiratory ARREST Do not call a "code blue"   In the event of cardiac or respiratory ARREST Do not perform Intubation, CPR, defibrillation or ACLS   In the event of cardiac or respiratory ARREST Use medication by any route, position, wound care, and other measures to relive pain and suffering. May use oxygen, suction and manual treatment of airway obstruction as needed for comfort.      04/12/19 2107          TOTAL TIME TAKING CARE OF THIS PATIENT: 45 minutes.    Henreitta Leber M.D on 04/17/2019 at 12:57 PM  Between 7am to 6pm - Pager - [phone]  After 6pm go to [email or web address] - Proofreader  [facility] [place] [facility]  Office  [phone]  CC: Primary care physician; Rusty Aus, MD

## 2019-04-17 NOTE — TOC Progression Note (Signed)
Transition of Care [facility]) - Progression Note    Patient Details  Name: OWAIN ECKERMAN MRN: [id number] Date of Birth: [birth year]  [facility]) CM/SW Contact  Elza Rafter, RN Phone Number: 04/17/2019, 11:29 AM  Clinical Narrative:   [place] area SNF have denied patient for STR.  Spoke with son to notify him that I am waiting on [facility] to offer; if they deny we will have to look into [facility].  Son [name] states he really wants him to go to [facility] but they have denied due to patient becoming combative a few nights ago and not participating in PT.  Patient did participate in PT yesterday.  [name] asked me to call Magda Paganini with [facility] again and update her on PT.  Called and left Magda Paganini a message.  MD aware.      Expected Discharge Plan: [place] Barriers to Discharge: Continued Medical Work up  Expected Discharge Plan and Services Expected Discharge Plan: [place]   Discharge Planning Services: CM Consult Post Acute Care Choice: [place] Living arrangements for the past 2 months: [facility])                                       Social Determinants of Health (SDOH) Interventions    Readmission Risk Interventions No flowsheet data found.

## 2019-04-17 NOTE — TOC Transition Note (Signed)
Transition of Care [facility]) - CM/SW Discharge Note   Patient Details  Name: BONIFACE GOFFE MRN: [id number] Date of Birth: 24-Sep-1941  Transition of Care [facility]) CM/SW Contact:  Elza Rafter, RN Phone Number: 04/17/2019, 2:21 PM   Clinical Narrative:   [facility] in [place] has accepted patient and can discharge today.  New PASRR started as his level E has expired.  Started Ship broker with [employer] with [employer].  Faxed required documentation to [phone].  Ishmeal Rorie is aware of DC today to SNF.  [facility] admissions coordinator has assigned bed 3235, call report to [phone] after 3 pm.  Placed DNR, Tramadol script, EMS form and face sheet on chart.  Caryl Pina, RN aware.  Narda Rutherford confirms she has received DC summary and FL2.      Final next level of care: [place] Barriers to Discharge: No Barriers Identified   Patient Goals and CMS Choice Patient states their goals for this hospitalization and ongoing recovery are:: To discharge back to [email or web address] Compare Post Acute Care list provided to:: Patient Represenative (must comment)(son [name]) Choice offered to / list presented to : Adult Children, Patient  Discharge Placement                Patient to be transferred to facility by: (EMS) Name of family member notified: Madelin Headings Patient and family notified of of transfer: 04/17/19  Discharge Plan and Services   Discharge Planning Services: CM Consult Post Acute Care Choice: [place]                               Social Determinants of Health (SDOH) Interventions     Readmission Risk Interventions No flowsheet data found.

## 2019-05-08 ENCOUNTER — Emergency Department: Payer: Medicare Other

## 2019-05-08 ENCOUNTER — Emergency Department
Admission: EM | Admit: 2019-05-08 | Discharge: 2019-05-08 | Disposition: A | Discharge status: Home / Self Care | Payer: Medicare Other | Attending: Emergency Medicine | Admitting: Emergency Medicine

## 2019-05-08 ENCOUNTER — Other Ambulatory Visit: Payer: Self-pay

## 2019-05-08 DIAGNOSIS — F0390 Unspecified dementia without behavioral disturbance: Secondary | ICD-10-CM | POA: Diagnosis not present

## 2019-05-08 DIAGNOSIS — Y93B9 Activity, other involving muscle strengthening exercises: Secondary | ICD-10-CM | POA: Insufficient documentation

## 2019-05-08 DIAGNOSIS — I509 Heart failure, unspecified: Secondary | ICD-10-CM | POA: Diagnosis not present

## 2019-05-08 DIAGNOSIS — I110 Hypertensive heart disease with heart failure: Secondary | ICD-10-CM | POA: Insufficient documentation

## 2019-05-08 DIAGNOSIS — S3992XD Unspecified injury of lower back, subsequent encounter: Secondary | ICD-10-CM | POA: Diagnosis present

## 2019-05-08 DIAGNOSIS — F17210 Nicotine dependence, cigarettes, uncomplicated: Secondary | ICD-10-CM | POA: Diagnosis not present

## 2019-05-08 DIAGNOSIS — Z79899 Other long term (current) drug therapy: Secondary | ICD-10-CM | POA: Diagnosis not present

## 2019-05-08 DIAGNOSIS — Z7901 Long term (current) use of anticoagulants: Secondary | ICD-10-CM | POA: Insufficient documentation

## 2019-05-08 DIAGNOSIS — S32040G Wedge compression fracture of fourth lumbar vertebra, subsequent encounter for fracture with delayed healing: Secondary | ICD-10-CM | POA: Diagnosis not present

## 2019-05-08 DIAGNOSIS — M79605 Pain in left leg: Secondary | ICD-10-CM | POA: Insufficient documentation

## 2019-05-08 DIAGNOSIS — Y999 Unspecified external cause status: Secondary | ICD-10-CM | POA: Diagnosis not present

## 2019-05-08 DIAGNOSIS — Y929 Unspecified place or not applicable: Secondary | ICD-10-CM | POA: Diagnosis not present

## 2019-05-08 DIAGNOSIS — J449 Chronic obstructive pulmonary disease, unspecified: Secondary | ICD-10-CM | POA: Insufficient documentation

## 2019-05-08 DIAGNOSIS — X58XXXA Exposure to other specified factors, initial encounter: Secondary | ICD-10-CM | POA: Insufficient documentation

## 2019-05-08 LAB — COMPREHENSIVE METABOLIC PANEL
ALT: 8 U/L (ref 0–44)
AST: 19 U/L (ref 15–41)
Albumin: 3.3 g/dL — ABNORMAL LOW (ref 3.5–5.0)
Alkaline Phosphatase: 132 U/L — ABNORMAL HIGH (ref 38–126)
Anion gap: 8 (ref 5–15)
BUN: 14 mg/dL (ref 8–23)
CO2: 27 mmol/L (ref 22–32)
Calcium: 8.7 mg/dL — ABNORMAL LOW (ref 8.9–10.3)
Chloride: 103 mmol/L (ref 98–111)
Creatinine, Ser: 0.8 mg/dL (ref 0.61–1.24)
GFR calc Af Amer: >60 mL/min (ref >60)
GFR calc non Af Amer: >60 mL/min (ref >60)
Glucose, Bld: 99 mg/dL (ref 70–99)
Potassium: 3.9 mmol/L (ref 3.5–5.1)
Sodium: 138 mmol/L (ref 135–145)
Total Bilirubin: 0.7 mg/dL (ref 0.3–1.2)
Total Protein: 6.4 g/dL — ABNORMAL LOW (ref 6.5–8.1)

## 2019-05-08 LAB — CBC
HCT: 46.7 % (ref 39.0–52.0)
Hemoglobin: 15 g/dL (ref 13.0–17.0)
MCH: 29.4 pg (ref 26.0–34.0)
MCHC: 32.1 g/dL (ref 30.0–36.0)
MCV: 91.6 fL (ref 80.0–100.0)
Platelets: 241 10*3/uL (ref 150–400)
RBC: 5.1 MIL/uL (ref 4.22–5.81)
RDW: 14.3 % (ref 11.5–15.5)
WBC: 9.2 10*3/uL (ref 4.0–10.5)
nRBC: 0 % (ref 0.0–0.2)

## 2019-05-08 LAB — URINALYSIS, COMPLETE (UACMP) WITH MICROSCOPIC
Bacteria, UA: NONE SEEN
Bilirubin Urine: NEGATIVE
Glucose, UA: NEGATIVE mg/dL
Hgb urine dipstick: NEGATIVE
Ketones, ur: NEGATIVE mg/dL
Nitrite: NEGATIVE
Protein, ur: NEGATIVE mg/dL
Specific Gravity, Urine: 1.019 (ref 1.005–1.030)
Squamous Epithelial / HPF: NONE SEEN (ref 0–5)
pH: 7 (ref 5.0–8.0)

## 2019-05-08 NOTE — ED Notes (Signed)
Returned from CT.

## 2019-05-08 NOTE — ED Notes (Signed)
Ricardo Erickson obtained for patient . Patient was not able to stand on his own. Reports he is scared of falling. Did not try to stand on his own.

## 2019-05-08 NOTE — ED Notes (Signed)
Pt ambulated few steps with walker with this RN and tech assistance. Pt did bear weight on bilat legs.

## 2019-05-08 NOTE — ED Notes (Signed)
Back brace placed, patient able to stand and walk stable with walker. Patient discharged to home.

## 2019-05-08 NOTE — ED Notes (Signed)
[employer] - tech called at this time for LSO back brace. Tech for company is sending out notification for brace at this time

## 2019-05-08 NOTE — ED Notes (Signed)
Wheel chair obtained, patient was able to sit on edge of bed and hold on to w/c arms and pivit himself with out assist into chair.

## 2019-05-08 NOTE — ED Notes (Signed)
Son notified in lobby about waiting for brace and pt status at this time. Family verbalize understanding

## 2019-05-08 NOTE — ED Triage Notes (Signed)
Reports left hip non traumatic pain. today. Patient non ambulatory,

## 2019-05-08 NOTE — Discharge Instructions (Signed)
Your compression fracture of your L-spine is worse than it was 1 month ago.  Please wear lumbar brace.  I have talked to Dr. Cari Caraway and he will follow-up with you in clinic.  You may also follow-up with Dr. Rudene Christians if you prefer.

## 2019-05-08 NOTE — ED Provider Notes (Signed)
[facility] Emergency Department Provider Note  ____________________________________________  Time seen: Approximately 5:21 PM  I have reviewed the triage vital signs and the nursing notes.   HISTORY  Chief Complaint No chief complaint on file.    HPI Ricardo Erickson is a 78 y.o. male that presents to the emergency department for evaluation of left hip pain this afternoon.  Patient states that he was working out in his room and developed left hip pain during workout.  His friends at the [facility] called EMS for evaluation at the emergency department.  Patient states that hip pain resolved shortly after EMS got there and no longer has any pain.  He would prefer not to stay in the emergency department any longer, feels silly, and would like to go home.  He recently finished rehab following a compression fracture of his lumbar spine.  He denies falling.  He denies any complaints or concerns currently.   Past Medical History:  Diagnosis Date  . CHF (congestive heart failure) ([place])   . Chronic back pain   . Dementia ([place])   . Depression   . Emphysema lung ([place])   . Hypertension   . Personal history of tobacco use, presenting hazards to health 08/06/2015  . Pollen allergies   . PTSD (post-traumatic stress disorder)   . Pulmonary embolism [facility])     Patient Active Problem List   Diagnosis Date Noted  . Acute exacerbation of congestive heart failure ([place]) 04/12/2019  . Influenza A 02/15/2019  . Pneumonia 11/27/2018  . Pulmonary emboli ([place]) 11/18/2017  . Tobacco use disorder 07/24/2017  . Encephalopathy   . Palliative care encounter   . Goals of care, counseling/discussion   . Pulmonary embolus ([place])   . UTI (urinary tract infection) 07/19/2017  . PTSD (post-traumatic stress disorder) 06/28/2017  . Adjustment disorder with anxiety 06/28/2017  . Closed compression fracture of L5 lumbar vertebra   . Left low back pain   . Weakness   . A-fib  ([place]) 06/24/2017  . Prediabetes 01/18/2017  . Prostate cancer ([place]) 01/18/2017  . Anxiety 01/18/2017  . Preventative health care 04/15/2016  . Restless leg syndrome 04/03/2016  . COPD (chronic obstructive pulmonary disease) ([place]) 04/03/2016  . BPH (benign prostatic hyperplasia) 04/03/2016  . Personal history of tobacco use, presenting hazards to health 08/06/2015    Past Surgical History:  Procedure Laterality Date  . BACK SURGERY    . KYPHOPLASTY N/A 06/27/2017   Procedure: KYPHOPLASTY -L5;  Surgeon: Hessie Knows, MD;  Location: [facility] ORS;  Service: Orthopedics;  Laterality: N/A;  . [place]    Prior to Admission medications   Medication Sig Start Date End Date Taking? Authorizing Provider  acetaminophen (TYLENOL) 650 MG CR tablet Take 650 mg by mouth every 8 (eight) hours as needed for pain.    [provider]  ADVAIR DISKUS 250-50 MCG/DOSE AEPB Inhale 1 puff into the lungs every 12 (twelve) hours. 04/09/18   [provider]  albuterol (PROVENTIL HFA;VENTOLIN HFA) 108 (90 Base) MCG/ACT inhaler Inhale 2 puffs into the lungs every 4 (four) hours as needed for wheezing or shortness of breath.     [provider]  albuterol (PROVENTIL) (2.5 MG/3ML) 0.083% nebulizer solution Inhale 2.5 mg into the lungs 4 (four) times daily.  01/15/15   [provider]  amiodarone (PACERONE) 200 MG tablet Two tabs po daily for three more days then take one tablet daily afterwards Patient taking differently:  Take 200 mg by mouth daily. Two tabs po daily for three more days then take one tablet daily afterwards 02/23/19   Loletha Grayer, MD  apixaban (ELIQUIS) 5 MG TABS tablet Take 1 tablet (5 mg total) by mouth 2 (two) times daily. 11/28/17   Gladstone Lighter, MD  docusate sodium (COLACE) 100 MG capsule Take 1 capsule (100 mg total) by mouth 2 (two) times daily. 02/23/19 02/23/20  Loletha Grayer, MD  finasteride (PROSCAR) 5 MG tablet Take 1  tablet (5 mg total) by mouth daily. 01/21/19   Festus Aloe, MD  FLUoxetine (PROZAC) 10 MG capsule Take 10 mg by mouth daily.    [provider]  furosemide (LASIX) 20 MG tablet Take 20 mg by mouth every morning.  06/29/18   [provider]  lidocaine (LIDODERM) 5 % Place 1 patch onto the skin daily. Remove & Discard patch within 12 hours or as directed by MD 11/28/18   Gladstone Lighter, MD  memantine (NAMENDA) 5 MG tablet Take 1 tablet (5 mg total) by mouth 2 (two) times daily. 02/23/19   Loletha Grayer, MD  metoprolol tartrate (LOPRESSOR) 50 MG tablet Take 1 tablet (50 mg total) by mouth 2 (two) times daily. 02/23/19   Loletha Grayer, MD  OLANZapine (ZYPREXA) 2.5 MG tablet Take 1.25 mg by mouth at bedtime.    [provider]  potassium chloride (K-DUR) 10 MEQ tablet Take 10 mEq by mouth daily. 06/27/18 06/27/19  [provider]  pramipexole (MIRAPEX) 1 MG tablet Take 1 tablet (1 mg total) by mouth at bedtime. 11/22/17   Gladstone Lighter, MD  SPIRIVA HANDIHALER 18 MCG inhalation capsule Place 18 mcg into inhaler and inhale daily. 06/11/18   [provider]  tamsulosin (FLOMAX) 0.4 MG CAPS capsule Take 2 capsules (0.8 mg total) by mouth daily after supper. Patient taking differently: Take 0.4 mg by mouth daily after supper.  02/02/18   Festus Aloe, MD  traMADol (ULTRAM) 50 MG tablet Take 0.5 tablets (25 mg total) by mouth 2 (two) times daily. 04/17/19   Henreitta Leber, MD    Allergies Ativan [lorazepam]  Family History  Problem Relation Age of Onset  . Alcohol abuse Father   . Arthritis Mother   . Heart disease Maternal Grandmother   . Prostate cancer Brother     Social History Social History   Tobacco Use  . Smoking status: Current Every Day Smoker    Packs/day: 0.50    Years: 60.00    Pack years: 30.00    Types: Cigarettes  . Smokeless tobacco: Never Used  Substance Use Topics  . Alcohol use: No    Alcohol/week: 0.0  standard drinks  . Drug use: No     Review of Systems  Constitutional: No fever/chills Cardiovascular: No chest pain. Respiratory: No SOB. Gastrointestinal: No abdominal pain.  No nausea, no vomiting.  Genitourinary: Negative for dysuria. Musculoskeletal: Positive for hip pain earlier today.  Negative for leg pain. Skin: Negative for rash, abrasions, lacerations, ecchymosis. Neurological: Negative for headaches, numbness or tingling   ____________________________________________   PHYSICAL EXAM:  VITAL SIGNS: ED Triage Vitals  Enc Vitals Group     BP 05/08/19 1539 137/72     Pulse Rate 05/08/19 1539 (!) 53     Resp 05/08/19 1539 (!) 24     Temp 05/08/19 1539 98.4 F (36.9 C)     Temp Source 05/08/19 1539 Oral     SpO2 05/08/19 1539 94 %     Weight  05/08/19 1541 196 lb 3.4 oz (89 kg)     Height 05/08/19 1541 5\' 8"  (1.727 m)     Head Circumference --      Peak Flow --      Pain Score 05/08/19 1540 10     Pain Loc --      Pain Edu? --      Excl. in [place]? --      Constitutional: Alert and oriented. Well appearing and in no acute distress. Eyes: Conjunctivae are normal. PERRL. EOMI. Head: Atraumatic. ENT:      Ears:      Nose: No congestion/rhinnorhea.      Mouth/Throat: Mucous membranes are moist.  Neck: No stridor.  Cardiovascular: Normal rate, regular rhythm.  Good peripheral circulation. Respiratory: Normal respiratory effort without tachypnea or retractions. Lungs CTAB. Good air entry to the bases with no decreased or absent breath sounds. Gastrointestinal: Bowel sounds 4 quadrants. Soft and nontender to palpation. No guarding or rigidity. No palpable masses. No distention. Musculoskeletal: Full range of motion to all extremities. No gross deformities appreciated.  Full range of motion of left hip.  No tenderness to palpation. Neurologic:  Normal speech and language. No gross focal neurologic deficits are appreciated.  Skin:  Skin is warm, dry and intact. No rash  noted. Psychiatric: Mood and affect are normal. Speech and behavior are normal. Patient exhibits appropriate insight and judgement.   ____________________________________________   LABS (all labs ordered are listed, but only abnormal results are displayed)  Labs Reviewed  COMPREHENSIVE METABOLIC PANEL - Abnormal; Notable for the following components:      Result Value   Calcium 8.7 (*)    Total Protein 6.4 (*)    Albumin 3.3 (*)    Alkaline Phosphatase 132 (*)    All other components within normal limits  URINALYSIS, COMPLETE (UACMP) WITH MICROSCOPIC - Abnormal; Notable for the following components:   Color, Urine YELLOW (*)    APPearance CLEAR (*)    Leukocytes,Ua TRACE (*)    All other components within normal limits  CBC   ____________________________________________  EKG   ____________________________________________  RADIOLOGY Robinette Haines, personally viewed and evaluated these images (plain radiographs) as part of my medical decision making, as well as reviewing the written report by the radiologist.  Ct Lumbar Spine Wo Contrast  Result Date: 05/08/2019 CLINICAL DATA:  Initial evaluation for acute back pain. EXAM: CT LUMBAR SPINE WITHOUT CONTRAST TECHNIQUE: Multidetector CT imaging of the lumbar spine was performed without intravenous contrast administration. Multiplanar CT image reconstructions were also generated. COMPARISON:  Previous MRI and CT from 04/12/2019. FINDINGS: Segmentation: Standard. Lowest well-formed disc labeled the L5-S1 level. Alignment: Trace 3 mm retrolisthesis of L5 on S1. Straightening with slight reversal of the normal lumbar lordosis. Vertebrae: Compression deformity involving the T10 vertebral body with up to 50% height loss and 3 mm bony retropulsion, somewhat age indeterminate, but favored to largely be chronic in nature. A severe compression deformity with near complete height loss at L1 and up to 6 mm bony retropulsion, relatively stable  from previous. This was subacute in appearance on previous MRI. Severe compression deformity involving the L2 vertebral body with near complete height loss and up to 4 mm bony retropulsion, also relatively unchanged from previous. This was also subacute in appearance on previous MRI. Compression deformity involving the superior endplate of L4, mildly worsened from previous now measuring up to 40% with 4 mm bony retropulsion. This is acute to subacute in appearance. Chronic  compression deformity involving the L5 vertebral body with sequelae of prior vertebral augmentation, stable. Superimpose prominent endplate Schmorl's node noted. Associated 4 mm bony retropulsion. Vertebral body heights otherwise maintained. Visualized sacrum and pelvis intact. SI joints approximated symmetric. Chronic remotely healed fractures of the left transverse process of L3 and left posterior eleventh rib noted as well. No discrete or worrisome osseous lesions. Paraspinal and other soft tissues: Paraspinous soft tissues demonstrate no acute finding. Advanced aorto bi-iliac atherosclerotic disease. Aneurysmal dilatation of the infrarenal aorta up to 3.2 cm. Common iliac arteries measure up to 1.7 cm on the left and 1.9 cm on the right. Prominent stool burden noted within the distal colon. Disc levels: T12-L1: 6 mm bony retropulsion related to the L1 compression fracture. Bilateral facet hypertrophy. Resultant mild-to-moderate spinal stenosis. Foramina remain patent. L1-2: 4 mm bony retropulsion related to the L2 compression deformity. Associated mild diffuse disc bulge, asymmetric to the left. Moderate facet hypertrophy. Thecal sac remains patent. No significant foraminal narrowing. L2-3: Left eccentric disc bulge. Moderate bilateral facet hypertrophy. Thecal sac remains patent. Mild bilateral foraminal narrowing. L3-4: Diffuse disc bulge. Moderate bilateral facet hypertrophy. Resultant severe spinal stenosis. Mild to moderate bilateral L3  foraminal narrowing. L4-5: Diffuse disc bulge. Moderate to advanced bilateral facet hypertrophy. Resultant severe spinal stenosis. Mild bilateral foraminal narrowing. L5-S1: Diffuse disc bulge with disc desiccation and intervertebral disc space narrowing. Mild to moderate facet hypertrophy. Resultant mild to moderate bilateral lateral recess stenosis. Distal thecal sac remains patent. Mild left with moderate right L5 foraminal narrowing. IMPRESSION: 1. Acute to subacute compression fracture involving the superior endplate of L4 with up to 40% height loss and 4 mm bony retropulsion. This has progressed and mildly worsened from previous. 2. Severe compression deformities involving the L1 and L2 vertebral bodies with up to 6 mm retropulsion at L1 and 4 mm at L2. Findings are stable from previous. These were subacute in appearance on previous MRI. 3. Compression deformity involving the T10 vertebral body with up to 50% height loss, somewhat age indeterminate, but favored to be chronic. 4. Chronic compression deformity of L5 with sequelae of prior vertebral augmentation, stable. Moderate multilevel degenerative spondylolysis with facet arthrosis as above, with resultant severe multifactorial spinal stenosis at L3-4 and L4-5. 5. 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 Recommend followup by ultrasound in 3 years. This recommendation follows ACR consensus guidelines: [name]. [facility] Coll Radiol 2013; 10:789-794 Electronically Signed   By: Jeannine Boga M.D.   On: 05/08/2019 20:50   Ct Hip Left Wo Contrast  Result Date: 05/08/2019 CLINICAL DATA:  Reports left hip non traumatic pain today. Patient non ambulatory. EXAM: CT OF THE LEFT HIP WITHOUT CONTRAST TECHNIQUE: Multidetector CT imaging of the left hip was performed according to the standard protocol. Multiplanar CT image reconstructions were also generated. COMPARISON:   06/24/2017 FINDINGS: Bones/Joint/Cartilage Generalized osteopenia. No fracture or dislocation. Normal alignment. No joint effusion. Ligaments Ligaments are suboptimally evaluated by CT. Muscles and Tendons Muscles are normal. No muscle atrophy. No intramuscular fluid collection or hematoma. Soft tissue No fluid collection or hematoma.  No soft tissue mass. IMPRESSION: 1.  No acute osseous injury of the left hip. Electronically Signed   By: Kathreen Devoid   On: 05/08/2019 20:40   US Venous Img Lower Unilateral Left  Result Date: 05/08/2019 CLINICAL DATA:  Lower extremity pain for 1 day EXAM: LEFT LOWER EXTREMITY VENOUS DOPPLER ULTRASOUND TECHNIQUE:  Gray-scale sonography with graded compression, as well as color Doppler and duplex ultrasound were performed to evaluate the lower extremity deep venous systems from the level of the common femoral vein and including the common femoral, femoral, profunda femoral, popliteal and calf veins including the posterior tibial, peroneal and gastrocnemius veins when visible. The superficial great saphenous vein was also interrogated. Spectral Doppler was utilized to evaluate flow at rest and with distal augmentation maneuvers in the common femoral, femoral and popliteal veins. COMPARISON:  None. FINDINGS: Contralateral Common Femoral Vein: Respiratory phasicity is normal and symmetric with the symptomatic side. No evidence of thrombus. Normal compressibility. Common Femoral Vein: No evidence of thrombus. Normal compressibility, respiratory phasicity and response to augmentation. Saphenofemoral Junction: No evidence of thrombus. Normal compressibility and flow on color Doppler imaging. Profunda Femoral Vein: No evidence of thrombus. Normal compressibility and flow on color Doppler imaging. Femoral Vein: No evidence of thrombus. Normal compressibility, respiratory phasicity and response to augmentation. Popliteal Vein: No evidence of thrombus. Normal compressibility, respiratory  phasicity and response to augmentation. Calf Veins: No evidence of thrombus. Normal compressibility and flow on color Doppler imaging. Superficial Great Saphenous Vein: No evidence of thrombus. Normal compressibility. Venous Reflux:  None. Other Findings:  None. IMPRESSION: No evidence of deep venous thrombosis. Electronically Signed   By: Inez Catalina M.D.   On: 05/08/2019 20:45   Dg Hip Unilat W Or Wo Pelvis 2-3 Views Left  Result Date: 05/08/2019 CLINICAL DATA:  Hip pain EXAM: DG HIP (WITH OR WITHOUT PELVIS) 2-3V LEFT COMPARISON:  None. FINDINGS: The patient is status post prior vertebral augmentation of the L5 vertebral body. Osteopenia is noted. There is no acute displaced fracture or dislocation. Mild degenerative changes are noted of both hips. IMPRESSION: No acute displaced fracture or dislocation. Electronically Signed   By: Constance Holster M.D.   On: 05/08/2019 17:29    ____________________________________________    PROCEDURES  Procedure(s) performed:    Procedures    Medications - No data to display   ____________________________________________   INITIAL IMPRESSION / ASSESSMENT AND PLAN / ED COURSE  Pertinent labs & imaging results that were available during my care of the patient were reviewed by me and considered in my medical decision making (see chart for details).  Review of the [place] CSRS was performed in accordance of the [place] prior to dispensing any controlled drugs.   Patient presented to emergency department for evaluation of left hip pain that started this afternoon.  X-ray negative for fracture.  CTs of lumbar spine and hip were ordered to further evaluate.  CT of the hip is negative for fracture.  CT of the lumbar spine shows multiple chronic compression fractures and a worsening compression fracture of L4.  Patient has chronic urinary incontinence.  He has some numbness, tingling, weakness to his bilateral lower legs but is unable to give any sort of  timeframe for these symptoms.  Dr. Cari Caraway with neurosurgery was consulted and recommends that patient be placed in a brace and follow-up with him in clinic outpatient.  Dr. Rip Harbour is agreeable with this plan of care.  Patient's son, who is in the waiting room is agreeable with this plan as well and is going to take patient home with him.  Patient is able to get up and ambulate using a walker without assistance.  Patient is to follow up with neurosurgery and primary care as directed.  Patient is given ED precautions to return to the ED for any worsening or new  symptoms.     ____________________________________________  FINAL CLINICAL IMPRESSION(S) / ED DIAGNOSES  Final diagnoses:  Compression fracture of L4 vertebra with delayed healing, subsequent encounter      NEW MEDICATIONS STARTED DURING THIS VISIT:  ED Discharge Orders    None          This chart was dictated using voice recognition software/Dragon. Despite best efforts to proofread, errors can occur which can change the meaning. Any change was purely unintentional.    Laban Emperor, PA-C 05/08/19 2358    Nena Polio, MD 05/09/19 Dyann Kief

## 2019-05-08 NOTE — ED Notes (Signed)
Pt taken to son for discharge in lobby. Son verbalizes d/c and follow up instructions as well. Pt in NAD, PT able to stand and ambulate to car. Pt unable to sign discharge signature du eto computer malfnx

## 2019-07-18 ENCOUNTER — Other Ambulatory Visit: Payer: Self-pay

## 2019-07-18 ENCOUNTER — Emergency Department: Payer: Medicare Other

## 2019-07-18 ENCOUNTER — Inpatient Hospital Stay
Admission: EM | Admit: 2019-07-18 | Discharge: 2019-07-22 | DRG: 291 | Payer: Medicare Other | Attending: Specialist | Admitting: Specialist

## 2019-07-18 DIAGNOSIS — Z91018 Allergy to other foods: Secondary | ICD-10-CM

## 2019-07-18 DIAGNOSIS — I110 Hypertensive heart disease with heart failure: Secondary | ICD-10-CM | POA: Diagnosis not present

## 2019-07-18 DIAGNOSIS — F329 Major depressive disorder, single episode, unspecified: Secondary | ICD-10-CM | POA: Diagnosis present

## 2019-07-18 DIAGNOSIS — F4310 Post-traumatic stress disorder, unspecified: Secondary | ICD-10-CM | POA: Diagnosis present

## 2019-07-18 DIAGNOSIS — I959 Hypotension, unspecified: Secondary | ICD-10-CM | POA: Diagnosis present

## 2019-07-18 DIAGNOSIS — R001 Bradycardia, unspecified: Secondary | ICD-10-CM | POA: Diagnosis present

## 2019-07-18 DIAGNOSIS — F419 Anxiety disorder, unspecified: Secondary | ICD-10-CM | POA: Diagnosis not present

## 2019-07-18 DIAGNOSIS — Z86711 Personal history of pulmonary embolism: Secondary | ICD-10-CM | POA: Diagnosis not present

## 2019-07-18 DIAGNOSIS — Z79899 Other long term (current) drug therapy: Secondary | ICD-10-CM | POA: Diagnosis not present

## 2019-07-18 DIAGNOSIS — F17210 Nicotine dependence, cigarettes, uncomplicated: Secondary | ICD-10-CM | POA: Diagnosis present

## 2019-07-18 DIAGNOSIS — I5033 Acute on chronic diastolic (congestive) heart failure: Secondary | ICD-10-CM | POA: Diagnosis present

## 2019-07-18 DIAGNOSIS — G8929 Other chronic pain: Secondary | ICD-10-CM | POA: Diagnosis present

## 2019-07-18 DIAGNOSIS — Z7901 Long term (current) use of anticoagulants: Secondary | ICD-10-CM

## 2019-07-18 DIAGNOSIS — F0151 Vascular dementia with behavioral disturbance: Secondary | ICD-10-CM | POA: Diagnosis present

## 2019-07-18 DIAGNOSIS — Z66 Do not resuscitate: Secondary | ICD-10-CM | POA: Diagnosis present

## 2019-07-18 DIAGNOSIS — Z811 Family history of alcohol abuse and dependence: Secondary | ICD-10-CM

## 2019-07-18 DIAGNOSIS — N400 Enlarged prostate without lower urinary tract symptoms: Secondary | ICD-10-CM | POA: Diagnosis present

## 2019-07-18 DIAGNOSIS — M549 Dorsalgia, unspecified: Secondary | ICD-10-CM | POA: Diagnosis present

## 2019-07-18 DIAGNOSIS — I482 Chronic atrial fibrillation, unspecified: Secondary | ICD-10-CM | POA: Diagnosis present

## 2019-07-18 DIAGNOSIS — I509 Heart failure, unspecified: Secondary | ICD-10-CM

## 2019-07-18 DIAGNOSIS — F0390 Unspecified dementia without behavioral disturbance: Secondary | ICD-10-CM | POA: Diagnosis not present

## 2019-07-18 DIAGNOSIS — F339 Major depressive disorder, recurrent, unspecified: Secondary | ICD-10-CM | POA: Diagnosis not present

## 2019-07-18 DIAGNOSIS — Z7951 Long term (current) use of inhaled steroids: Secondary | ICD-10-CM

## 2019-07-18 DIAGNOSIS — J9601 Acute respiratory failure with hypoxia: Secondary | ICD-10-CM | POA: Diagnosis present

## 2019-07-18 DIAGNOSIS — Z8249 Family history of ischemic heart disease and other diseases of the circulatory system: Secondary | ICD-10-CM | POA: Diagnosis not present

## 2019-07-18 DIAGNOSIS — J439 Emphysema, unspecified: Secondary | ICD-10-CM | POA: Diagnosis present

## 2019-07-18 DIAGNOSIS — R531 Weakness: Secondary | ICD-10-CM

## 2019-07-18 DIAGNOSIS — Z8261 Family history of arthritis: Secondary | ICD-10-CM

## 2019-07-18 DIAGNOSIS — I2699 Other pulmonary embolism without acute cor pulmonale: Secondary | ICD-10-CM | POA: Diagnosis present

## 2019-07-18 DIAGNOSIS — Z20828 Contact with and (suspected) exposure to other viral communicable diseases: Secondary | ICD-10-CM | POA: Diagnosis present

## 2019-07-18 LAB — URINALYSIS, COMPLETE (UACMP) WITH MICROSCOPIC
Bacteria, UA: NONE SEEN
Bilirubin Urine: NEGATIVE
Glucose, UA: NEGATIVE mg/dL
Hgb urine dipstick: NEGATIVE
Ketones, ur: NEGATIVE mg/dL
Leukocytes,Ua: NEGATIVE
Nitrite: NEGATIVE
Protein, ur: NEGATIVE mg/dL
Specific Gravity, Urine: 1.005 (ref 1.005–1.030)
Squamous Epithelial / HPF: NONE SEEN (ref 0–5)
WBC, UA: NONE SEEN WBC/hpf (ref 0–5)
pH: 7 (ref 5.0–8.0)

## 2019-07-18 LAB — TROPONIN I (HIGH SENSITIVITY)
Troponin I (High Sensitivity): 5 ng/L (ref <18)
Troponin I (High Sensitivity): 7 ng/L (ref <18)

## 2019-07-18 LAB — CBC WITH DIFFERENTIAL/PLATELET
Abs Immature Granulocytes: 0.04 10*3/uL (ref 0.00–0.07)
Basophils Absolute: 0.1 10*3/uL (ref 0.0–0.1)
Basophils Relative: 1 %
Eosinophils Absolute: 0.1 10*3/uL (ref 0.0–0.5)
Eosinophils Relative: 1 %
HCT: 41 % (ref 39.0–52.0)
Hemoglobin: 12.7 g/dL — ABNORMAL LOW (ref 13.0–17.0)
Immature Granulocytes: 0 %
Lymphocytes Relative: 16 %
Lymphs Abs: 1.6 10*3/uL (ref 0.7–4.0)
MCH: 28.6 pg (ref 26.0–34.0)
MCHC: 31 g/dL (ref 30.0–36.0)
MCV: 92.3 fL (ref 80.0–100.0)
Monocytes Absolute: 1.1 10*3/uL — ABNORMAL HIGH (ref 0.1–1.0)
Monocytes Relative: 11 %
Neutro Abs: 7.3 10*3/uL (ref 1.7–7.7)
Neutrophils Relative %: 71 %
Platelets: 266 10*3/uL (ref 150–400)
RBC: 4.44 MIL/uL (ref 4.22–5.81)
RDW: 14 % (ref 11.5–15.5)
WBC: 10.1 10*3/uL (ref 4.0–10.5)
nRBC: 0 % (ref 0.0–0.2)

## 2019-07-18 LAB — COMPREHENSIVE METABOLIC PANEL
ALT: 8 U/L (ref 0–44)
AST: 22 U/L (ref 15–41)
Albumin: 2.9 g/dL — ABNORMAL LOW (ref 3.5–5.0)
Alkaline Phosphatase: 77 U/L (ref 38–126)
Anion gap: 8 (ref 5–15)
BUN: 11 mg/dL (ref 8–23)
CO2: 25 mmol/L (ref 22–32)
Calcium: 8.3 mg/dL — ABNORMAL LOW (ref 8.9–10.3)
Chloride: 106 mmol/L (ref 98–111)
Creatinine, Ser: 0.7 mg/dL (ref 0.61–1.24)
GFR calc Af Amer: >60 mL/min (ref >60)
GFR calc non Af Amer: >60 mL/min (ref >60)
Glucose, Bld: 96 mg/dL (ref 70–99)
Potassium: 3.4 mmol/L — ABNORMAL LOW (ref 3.5–5.1)
Sodium: 139 mmol/L (ref 135–145)
Total Bilirubin: 0.8 mg/dL (ref 0.3–1.2)
Total Protein: 5.8 g/dL — ABNORMAL LOW (ref 6.5–8.1)

## 2019-07-18 LAB — BRAIN NATRIURETIC PEPTIDE: B Natriuretic Peptide: 56 pg/mL (ref 0.0–100.0)

## 2019-07-18 LAB — SARS CORONAVIRUS 2 BY RT PCR (HOSPITAL ORDER, PERFORMED IN ~~LOC~~ HOSPITAL LAB): SARS Coronavirus 2: NEGATIVE

## 2019-07-18 LAB — ETHANOL: Alcohol, Ethyl (B): <10 mg/dL (ref <10)

## 2019-07-18 MED ORDER — APIXABAN 5 MG PO TABS
5.0000 mg | ORAL_TABLET | Freq: Two times a day (BID) | ORAL | Status: DC
  Administered 2019-07-19 – 2019-07-22 (×8): 5 mg via ORAL
  Filled 2019-07-18 (×9): qty 1

## 2019-07-18 MED ORDER — ACETAMINOPHEN 325 MG PO TABS: 650.0000 mg | ORAL_TABLET | Freq: Three times a day (TID) | ORAL | Status: DC | PRN

## 2019-07-18 MED ORDER — AMIODARONE HCL 200 MG PO TABS
200.0000 mg | ORAL_TABLET | Freq: Every day | ORAL | Status: DC
  Administered 2019-07-19: 200 mg via ORAL
  Filled 2019-07-18: qty 1

## 2019-07-18 MED ORDER — MEMANTINE HCL 5 MG PO TABS
5.0000 mg | ORAL_TABLET | Freq: Two times a day (BID) | ORAL | Status: DC
  Administered 2019-07-19 – 2019-07-22 (×8): 5 mg via ORAL
  Filled 2019-07-18 (×9): qty 1

## 2019-07-18 MED ORDER — FUROSEMIDE 10 MG/ML IJ SOLN
20.0000 mg | Freq: Two times a day (BID) | INTRAMUSCULAR | Status: DC
  Filled 2019-07-18: qty 2

## 2019-07-18 MED ORDER — DOCUSATE SODIUM 100 MG PO CAPS
100.0000 mg | ORAL_CAPSULE | Freq: Two times a day (BID) | ORAL | Status: DC
  Administered 2019-07-19 – 2019-07-22 (×8): 100 mg via ORAL
  Filled 2019-07-18 (×9): qty 1

## 2019-07-18 MED ORDER — POTASSIUM CHLORIDE CRYS ER 10 MEQ PO TBCR
10.0000 meq | EXTENDED_RELEASE_TABLET | Freq: Every day | ORAL | Status: DC
  Administered 2019-07-19 – 2019-07-22 (×4): 10 meq via ORAL
  Filled 2019-07-18 (×4): qty 1

## 2019-07-18 MED ORDER — ACETAMINOPHEN 325 MG PO TABS: 650.0000 mg | ORAL_TABLET | ORAL | Status: DC | PRN

## 2019-07-18 MED ORDER — FINASTERIDE 5 MG PO TABS
5.0000 mg | ORAL_TABLET | Freq: Every day | ORAL | Status: DC
  Administered 2019-07-19 – 2019-07-22 (×4): 5 mg via ORAL
  Filled 2019-07-18 (×4): qty 1

## 2019-07-18 MED ORDER — FUROSEMIDE 10 MG/ML IJ SOLN
40.0000 mg | Freq: Once | INTRAMUSCULAR | Status: AC
  Administered 2019-07-18: 19:00:00 40 mg via INTRAVENOUS
  Filled 2019-07-18: qty 4

## 2019-07-18 MED ORDER — MOMETASONE FURO-FORMOTEROL FUM 200-5 MCG/ACT IN AERO
2.0000 | INHALATION_SPRAY | Freq: Two times a day (BID) | RESPIRATORY_TRACT | Status: DC
  Administered 2019-07-19 – 2019-07-22 (×8): 2 via RESPIRATORY_TRACT
  Filled 2019-07-18: qty 8.8

## 2019-07-18 MED ORDER — SODIUM CHLORIDE 0.9% FLUSH
3.0000 mL | Freq: Two times a day (BID) | INTRAVENOUS | Status: DC
  Administered 2019-07-19 – 2019-07-20 (×5): 3 mL via INTRAVENOUS

## 2019-07-18 MED ORDER — SODIUM CHLORIDE 0.9% FLUSH: 3.0000 mL | INTRAVENOUS | Status: DC | PRN

## 2019-07-18 MED ORDER — LISINOPRIL 5 MG PO TABS
5.0000 mg | ORAL_TABLET | Freq: Every day | ORAL | Status: DC
  Administered 2019-07-19 – 2019-07-22 (×4): 5 mg via ORAL
  Filled 2019-07-18 (×4): qty 1

## 2019-07-18 MED ORDER — PRAMIPEXOLE DIHYDROCHLORIDE 1 MG PO TABS
1.0000 mg | ORAL_TABLET | Freq: Every day | ORAL | Status: DC
  Administered 2019-07-19 – 2019-07-21 (×4): 1 mg via ORAL
  Filled 2019-07-18 (×5): qty 1

## 2019-07-18 MED ORDER — ALBUTEROL SULFATE (2.5 MG/3ML) 0.083% IN NEBU
2.5000 mg | INHALATION_SOLUTION | Freq: Four times a day (QID) | RESPIRATORY_TRACT | Status: DC
  Administered 2019-07-19 (×2): 2.5 mg via RESPIRATORY_TRACT
  Filled 2019-07-18 (×2): qty 3

## 2019-07-18 MED ORDER — FLUOXETINE HCL 10 MG PO CAPS
10.0000 mg | ORAL_CAPSULE | Freq: Every day | ORAL | Status: DC
  Administered 2019-07-19: 11:00:00 10 mg via ORAL
  Filled 2019-07-18: qty 1

## 2019-07-18 MED ORDER — POTASSIUM CHLORIDE CRYS ER 20 MEQ PO TBCR
40.0000 meq | EXTENDED_RELEASE_TABLET | Freq: Once | ORAL | Status: AC
  Administered 2019-07-19: 40 meq via ORAL
  Filled 2019-07-18: qty 2

## 2019-07-18 MED ORDER — SODIUM CHLORIDE 0.9 % IV SOLN: 250.0000 mL | INTRAVENOUS | Status: DC | PRN

## 2019-07-18 MED ORDER — TAMSULOSIN HCL 0.4 MG PO CAPS
0.4000 mg | ORAL_CAPSULE | Freq: Every day | ORAL | Status: DC
  Administered 2019-07-19 – 2019-07-21 (×3): 0.4 mg via ORAL
  Filled 2019-07-18 (×3): qty 1

## 2019-07-18 MED ORDER — ONDANSETRON HCL 4 MG/2ML IJ SOLN: 4.0000 mg | Freq: Four times a day (QID) | INTRAMUSCULAR | Status: DC | PRN

## 2019-07-18 MED ORDER — TIOTROPIUM BROMIDE MONOHYDRATE 18 MCG IN CAPS
18.0000 ug | ORAL_CAPSULE | Freq: Every day | RESPIRATORY_TRACT | Status: DC
  Administered 2019-07-19 – 2019-07-22 (×4): 18 ug via RESPIRATORY_TRACT
  Filled 2019-07-18: qty 5

## 2019-07-18 NOTE — H&P (Signed)
[facility] NAME: Ricardo Erickson    MR#:  [id number]  DATE OF BIRTH:  09/06/1941  DATE OF ADMISSION:  07/18/2019  PRIMARY CARE PHYSICIAN: Rusty Aus, MD   REQUESTING/REFERRING PHYSICIAN: McShane  CHIEF COMPLAINT:  No chief complaint on file.   HISTORY OF PRESENT ILLNESS: Ricardo Erickson  is a 78 y.o. male with a known history of CHF, chronic back pain, dementia, emphysema, depression, hypertension, pulmonary embolism-on Eliquis, lives in a group home sent to emergency room with shortness of breath and swelling on both legs. Patient denies any chest pain, fever or cough. He was noted to be in acute congestive heart failure.  His heart rate was also running in 40s or 50s but blood pressure was stable.  PAST MEDICAL HISTORY:   Past Medical History:  Diagnosis Date  . CHF (congestive heart failure) ([place])   . Chronic back pain   . Dementia ([place])   . Depression   . Emphysema lung ([place])   . Hypertension   . Personal history of tobacco use, presenting hazards to health 08/06/2015  . Pollen allergies   . PTSD (post-traumatic stress disorder)   . Pulmonary embolism ([place])     PAST SURGICAL HISTORY:  Past Surgical History:  Procedure Laterality Date  . BACK SURGERY    . KYPHOPLASTY N/A 06/27/2017   Procedure: KYPHOPLASTY -L5;  Surgeon: Hessie Knows, MD;  Location: [facility] ORS;  Service: Orthopedics;  Laterality: N/A;  . TONSILLECTOMY AND ADENOIDECTOMY  1980    SOCIAL HISTORY:  Social History   Tobacco Use  . Smoking status: Current Every Day Smoker    Packs/day: 0.50    Years: 60.00    Pack years: 30.00    Types: Cigarettes  . Smokeless tobacco: Never Used  Substance Use Topics  . Alcohol use: No    Alcohol/week: 0.0 standard drinks    FAMILY HISTORY:  Family History  Problem Relation Age of Onset  . Alcohol abuse Father   . Arthritis Mother   . Heart disease Maternal Grandmother   . Prostate cancer Brother     DRUG  ALLERGIES:  Allergies  Allergen Reactions  . Ativan [Lorazepam] Other (See Comments)    Patient gets severe altered mental status and confusion when taking this medicine    REVIEW OF SYSTEMS:   CONSTITUTIONAL: No fever, fatigue or weakness.  EYES: No blurred or double vision.  EARS, NOSE, AND THROAT: No tinnitus or ear pain.  RESPIRATORY: No cough, have shortness of breath,no wheezing or hemoptysis.  CARDIOVASCULAR: No chest pain, orthopnea,have b/l edema.  GASTROINTESTINAL: No nausea, vomiting, diarrhea or abdominal pain.  GENITOURINARY: No dysuria, hematuria.  ENDOCRINE: No polyuria, nocturia,  HEMATOLOGY: No anemia, easy bruising or bleeding SKIN: No rash or lesion. MUSCULOSKELETAL: No joint pain or arthritis.   NEUROLOGIC: No tingling, numbness, weakness.  PSYCHIATRY: No anxiety or depression.   MEDICATIONS AT HOME:  Prior to Admission medications   Medication Sig Start Date End Date Taking? Authorizing Provider  acetaminophen (TYLENOL) 650 MG CR tablet Take 650 mg by mouth every 8 (eight) hours as needed for pain.   Yes [provider]  ADVAIR DISKUS 250-50 MCG/DOSE AEPB Inhale 1 puff into the lungs every 12 (twelve) hours. 04/09/18  Yes [provider]  albuterol (PROVENTIL HFA;VENTOLIN HFA) 108 (90 Base) MCG/ACT inhaler Inhale 2 puffs into the lungs every 4 (four) hours as needed for wheezing or shortness of breath.    Yes [provider]  albuterol (PROVENTIL) (2.5 MG/3ML) 0.083% nebulizer solution Inhale 2.5 mg into the lungs 4 (four) times daily.  01/15/15  Yes [provider]  amiodarone (PACERONE) 200 MG tablet Two tabs po daily for three more days then take one tablet daily afterwards Patient taking differently: Take 200 mg by mouth daily.  02/23/19  Yes Wieting, Richard, MD  apixaban (ELIQUIS) 5 MG TABS tablet Take 1 tablet (5 mg total) by mouth 2 (two) times daily. 11/28/17  Yes Gladstone Lighter, MD  docusate sodium (COLACE) 100 MG  capsule Take 1 capsule (100 mg total) by mouth 2 (two) times daily. 02/23/19 02/23/20 Yes Wieting, Richard, MD  finasteride (PROSCAR) 5 MG tablet Take 1 tablet (5 mg total) by mouth daily. 01/21/19  Yes Festus Aloe, MD  FLUoxetine (PROZAC) 10 MG capsule Take 10 mg by mouth daily.   Yes [provider]  furosemide (LASIX) 20 MG tablet Take 20 mg by mouth every morning.  06/29/18  Yes [provider]  lidocaine (LIDODERM) 5 % Place 1 patch onto the skin daily. Remove & Discard patch within 12 hours or as directed by MD 11/28/18  Yes Gladstone Lighter, MD  memantine (NAMENDA) 5 MG tablet Take 1 tablet (5 mg total) by mouth 2 (two) times daily. 02/23/19  Yes Wieting, Richard, MD  metoprolol tartrate (LOPRESSOR) 50 MG tablet Take 1 tablet (50 mg total) by mouth 2 (two) times daily. Patient taking differently: Take 50 mg by mouth daily.  02/23/19  Yes Wieting, Richard, MD  potassium chloride (K-DUR) 10 MEQ tablet Take 10 mEq by mouth daily.   Yes [provider]  pramipexole (MIRAPEX) 1 MG tablet Take 1 tablet (1 mg total) by mouth at bedtime. 11/22/17  Yes Gladstone Lighter, MD  SPIRIVA HANDIHALER 18 MCG inhalation capsule Place 18 mcg into inhaler and inhale daily. 06/11/18  Yes [provider]  tamsulosin (FLOMAX) 0.4 MG CAPS capsule Take 2 capsules (0.8 mg total) by mouth daily after supper. Patient taking differently: Take 0.4 mg by mouth daily after supper.  02/02/18  Yes Festus Aloe, MD      PHYSICAL EXAMINATION:   VITAL SIGNS: Blood pressure 109/65, pulse (!) 47, temperature 98.3 F (36.8 C), temperature source Oral, resp. rate (!) 26, height 5\' 7"  (1.702 m), weight 85 kg, SpO2 100 %.  GENERAL:  78 y.o.-year-old patient lying in the bed with no acute distress.  EYES: Pupils equal, round, reactive to light and accommodation. No scleral icterus. Extraocular muscles intact.  HEENT: Head atraumatic, normocephalic. Oropharynx and nasopharynx clear.  NECK:   Supple, no jugular venous distention. No thyroid enlargement, no tenderness.  LUNGS: Normal breath sounds bilaterally, no wheezing, have crepitation. No use of accessory muscles of respiration.  CARDIOVASCULAR: S1, S2 normal. No murmurs, rubs, or gallops.  ABDOMEN: Soft, nontender, nondistended. Bowel sounds present. No organomegaly or mass.  EXTREMITIES: b/l pedal edema,no cyanosis, or clubbing.  NEUROLOGIC: Cranial nerves II through XII are intact. Muscle strength 5/5 in all extremities. Sensation intact. Gait not checked.  PSYCHIATRIC: The patient is alert and oriented x 3.  SKIN: No obvious rash, lesion, or ulcer.   LABORATORY PANEL:   CBC Recent Labs  Lab 07/18/19 1633  WBC 10.1  HGB 12.7*  HCT 41.0  PLT 266  MCV 92.3  MCH 28.6  MCHC 31.0  RDW 14.0  LYMPHSABS 1.6  MONOABS 1.1*  EOSABS 0.1  BASOSABS 0.1   ------------------------------------------------------------------------------------------------------------------  Chemistries  Recent Labs  Lab 07/18/19 1633  NA 139  K 3.4*  CL 106  CO2 25  GLUCOSE 96  BUN 11  CREATININE 0.70  CALCIUM 8.3*  AST 22  ALT 8  ALKPHOS 77  BILITOT 0.8   ------------------------------------------------------------------------------------------------------------------ estimated creatinine clearance is 81.9 mL/min (by C-G formula based on SCr of 0.7 mg/dL). ------------------------------------------------------------------------------------------------------------------ No results for input(s): TSH, T4TOTAL, T3FREE, THYROIDAB in the last 72 hours.  Invalid input(s): FREET3   Coagulation profile No results for input(s): INR, PROTIME in the last 168 hours. ------------------------------------------------------------------------------------------------------------------- No results for input(s): DDIMER in the last 72  hours. -------------------------------------------------------------------------------------------------------------------  Cardiac Enzymes No results for input(s): CKMB, TROPONINI, MYOGLOBIN in the last 168 hours.  Invalid input(s): CK ------------------------------------------------------------------------------------------------------------------ Invalid input(s): POCBNP  ---------------------------------------------------------------------------------------------------------------  Urinalysis    Component Value Date/Time   COLORURINE YELLOW (A) 05/08/2019 1720   APPEARANCEUR CLEAR (A) 05/08/2019 1720   APPEARANCEUR Clear 02/02/2018 0942   LABSPEC 1.019 05/08/2019 1720   PHURINE 7.0 05/08/2019 1720   GLUCOSEU NEGATIVE 05/08/2019 1720   HGBUR NEGATIVE 05/08/2019 1720   BILIRUBINUR NEGATIVE 05/08/2019 1720   BILIRUBINUR Negative 02/02/2018 0942   KETONESUR NEGATIVE 05/08/2019 1720   PROTEINUR NEGATIVE 05/08/2019 1720   UROBILINOGEN 0.2 06/19/2017 1216   NITRITE NEGATIVE 05/08/2019 1720   LEUKOCYTESUR TRACE (A) 05/08/2019 1720     RADIOLOGY: Dg Chest 2 View  Result Date: 07/18/2019 CLINICAL DATA:  Weakness, increased leftward leaning EXAM: CHEST - 2 VIEW COMPARISON:  Chest radiograph Apr 12, 2019, CTA chest February 15, 2019 FINDINGS: Patient is rotated right anterior oblique. Apparent cardiomediastinal enlargement likely related to rotation and portable technique. There is increasing patchy airspace and interstitial opacity with a basilar predominance as well as new left effusion. The pulmonary vascularity is indistinct. The aorta is calcified and tortuous. The bones are diffusely demineralized. Several remote left rib deformities are again seen. No pneumothorax. IMPRESSION: Worsening patchy airspace and interstitial opacities with a basilar predominance and new left effusion, most concerning for worsening pulmonary edema though developing infection could have a similar appearance.  Electronically Signed   By: Lovena Le M.D.   On: 07/18/2019 16:09   Ct Head Wo Contrast  Result Date: 07/18/2019 CLINICAL DATA:  Leaning to the left, concern for CVA EXAM: CT HEAD WITHOUT CONTRAST TECHNIQUE: Contiguous axial images were obtained from the base of the skull through the vertex without intravenous contrast. COMPARISON:  Head CT Apr 12, 2019 FINDINGS: Brain: No evidence of acute infarction, hemorrhage, hydrocephalus, extra-axial collection or mass lesion/mass effect. Symmetric prominence of the ventricles, cisterns and sulci compatible with parenchymal volume loss. Overall extent is similar to prior. Patchy areas of white matter hypoattenuation are most compatible with chronic microvascular angiopathy. Vascular: Atherosclerotic calcification of the carotid siphons. Skull: No calvarial fracture or suspicious osseous lesion. No scalp swelling or hematoma. Sinuses/Orbits: Paranasal sinuses and mastoid air cells are predominantly clear. Orbital structures are unremarkable. Other: None. IMPRESSION: No acute intracranial abnormality. Stable parenchymal volume loss and chronic microvascular angiopathy. Electronically Signed   By: Lovena Le M.D.   On: 07/18/2019 16:17    EKG: Orders placed or performed during the hospital encounter of 07/18/19  . EKG 12-Lead  . EKG 12-Lead  . EKG 12-Lead  . EKG 12-Lead  . ED EKG  . ED EKG    IMPRESSION AND PLAN:  *Acute respiratory failure Acute on chronic diastolic congestive heart failure  Recent echocardiogram shows ejection fraction 60%. IV Lasix, daily weight, intake and output measurement. Hold beta-blocker due to currently bradycardia Start on ACE  inhibitor small dose.  *COPD Currently no exacerbation symptoms Continue home inhalers and nebs if needed.  *Pulmonary emboli Continue Eliquis.  *Atrial fibrillation Continue amiodarone, hold metoprolol for now due to bradycardia, continue Eliquis.  *Active smoker Counseled to quit  smoking for 4 minutes and offered nicotine patch.  *BPH Continue finasteride and Flomax.  All the records are reviewed and case discussed with ED provider. Management plans discussed with the patient, family and they are in agreement.  CODE STATUS: DNR Code Status History    Date Active Date Inactive Code Status Order ID Comments User Context   04/12/2019 2107 04/17/2019 2100 DNR [id number]  Lang Snow, NP Inpatient   02/19/2019 1455 02/24/2019 1522 DNR [id number]  Hillary Bow, MD Inpatient   02/15/2019 2104 02/19/2019 1455 Full Code [id number]  Mayo, Pete Pelt, MD Inpatient   11/27/2018 1042 11/28/2018 1939 Full Code [id number]  Gladstone Lighter, MD ED   11/18/2017 1245 11/22/2017 2031 DNR [id number]  Hillary Bow, MD Inpatient   11/18/2017 0854 11/18/2017 1245 Full Code [id number]  Hillary Bow, MD ED   07/21/2017 1308 07/28/2017 1900 DNR [id number]  Max Sane, MD Inpatient   07/20/2017 0058 07/21/2017 1308 Full Code [id number]  Lance Coon, MD Inpatient   06/24/2017 2235 06/30/2017 0312 Full Code [id number]  Fritzi Mandes, MD Inpatient   Advance Care Planning Activity    Questions for Most Recent Historical Code Status (Order [id number])    Question Answer Comment   In the event of cardiac or respiratory ARREST Do not call a "code blue"    In the event of cardiac or respiratory ARREST Do not perform Intubation, CPR, defibrillation or ACLS    In the event of cardiac or respiratory ARREST Use medication by any route, position, wound care, and other measures to relive pain and suffering. May use oxygen, suction and manual treatment of airway obstruction as needed for comfort.        TOTAL TIME TAKING CARE OF THIS PATIENT: 45 minutes.    Vaughan Basta M.D on 07/18/2019   Between 7am to 6pm - Pager - [phone]  After 6pm go to [email or web address] - password [email or web address] [facility]  Office  [phone]  CC: Primary care physician; Rusty Aus, MD   Note:  This dictation was prepared with Dragon dictation along with smaller phrase technology. Any transcriptional errors that result from this process are unintentional.

## 2019-07-18 NOTE — ED Notes (Signed)
Pt resting comfortably in bed at this time, NAD noted. Respirations even and unlabored at this time. Will continue to monitor for further patient needs.

## 2019-07-18 NOTE — ED Notes (Signed)
ED TO INPATIENT HANDOFF REPORT  ED Nurse Name and Phone #: Ricardo Erickson Name/Age/Gender Ricardo Erickson 78 y.o. male Room/Bed: ED07A/ED07A  Code Status   Code Status: Prior  Home/SNF/Other group home Patient oriented to: self, place, time and situation Is this baseline? Yes      Chief Complaint lt side weakness ems  Triage Note Pt sent in after physical therapy for increased leaning to the left. Pt is a&ox3 and denies paint.  Negative stroke scale.   Allergies Allergies  Allergen Reactions  . Ativan [Lorazepam] Other (See Comments)    Patient gets severe altered mental status and confusion when taking this medicine    Level of Care/Admitting Diagnosis ED Disposition    ED Disposition Condition Comment   Admit  Hospital Area: [facility] [[id number]]  Level of Care: Med-Surg [16]  Covid Evaluation: Confirmed COVID Negative  Diagnosis: CHF (congestive heart failure) [facility]) [[id number]]  Admitting Physician: Vaughan Basta [phone]  Attending Physician: Vaughan Basta [phone]  Estimated length of stay: past midnight tomorrow  Certification:: I certify this patient will need inpatient services for at least 2 midnights  PT Class (Do Not Modify): Inpatient [101]  PT Acc Code (Do Not Modify): Private [1]       B Medical/Surgery History Past Medical History:  Diagnosis Date  . CHF (congestive heart failure) ([place])   . Chronic back pain   . Dementia ([place])   . Depression   . Emphysema lung ([place])   . Hypertension   . Personal history of tobacco use, presenting hazards to health 08/06/2015  . Pollen allergies   . PTSD (post-traumatic stress disorder)   . Pulmonary embolism [facility])    Past Surgical History:  Procedure Laterality Date  . BACK SURGERY    . KYPHOPLASTY N/A 06/27/2017   Procedure: KYPHOPLASTY -L5;  Surgeon: Hessie Knows, MD;  Location: [facility] ORS;  Service: Orthopedics;  Laterality: N/A;  . TONSILLECTOMY AND ADENOIDECTOMY   1980     A IV Location/Drains/Wounds Patient Lines/Drains/Airways Status   Active Line/Drains/Airways    Name:   Placement date:   Placement time:   Site:   Days:   External Urinary Catheter   04/12/19    2121    -   97   Incision (Closed) 07/23/17 Back   07/23/17    1900     725          Intake/Output Last 24 hours  Intake/Output Summary (Last 24 hours) at 07/18/2019 2228 Last data filed at 07/18/2019 2147 Gross per 24 hour  Intake -  Output 1400 ml  Net -1400 ml    Labs/Imaging Results for orders placed or performed during the hospital encounter of 07/18/19 (from the past 48 hour(s))  CBC with Differential     Status: Abnormal   Collection Time: 07/18/19  4:33 PM  Result Value Ref Range   WBC 10.1 4.0 - 10.5 K/uL   RBC 4.44 4.22 - 5.81 MIL/uL   Hemoglobin 12.7 (L) 13.0 - 17.0 g/dL   HCT 41.0 39.0 - 52.0 %   MCV 92.3 80.0 - 100.0 fL   MCH 28.6 26.0 - 34.0 pg   MCHC 31.0 30.0 - 36.0 g/dL   RDW 14.0 11.5 - 15.5 %   Platelets 266 150 - 400 K/uL   nRBC 0.0 0.0 - 0.2 %   Neutrophils Relative % 71 %   Neutro Abs 7.3 1.7 - 7.7 K/uL   Lymphocytes Relative 16 %   Lymphs  Abs 1.6 0.7 - 4.0 K/uL   Monocytes Relative 11 %   Monocytes Absolute 1.1 (H) 0.1 - 1.0 K/uL   Eosinophils Relative 1 %   Eosinophils Absolute 0.1 0.0 - 0.5 K/uL   Basophils Relative 1 %   Basophils Absolute 0.1 0.0 - 0.1 K/uL   Immature Granulocytes 0 %   Abs Immature Granulocytes 0.04 0.00 - 0.07 K/uL    Comment: Performed at [facility], [address] [place], [place] [address]  Comprehensive metabolic panel     Status: Abnormal   Collection Time: 07/18/19  4:33 PM  Result Value Ref Range   Sodium 139 135 - 145 mmol/L   Potassium 3.4 (L) 3.5 - 5.1 mmol/L   Chloride 106 98 - 111 mmol/L   CO2 25 22 - 32 mmol/L   Glucose, Bld 96 70 - 99 mg/dL   BUN 11 8 - 23 mg/dL   Creatinine, Ser 0.70 0.61 - 1.24 mg/dL   Calcium 8.3 (L) 8.9 - 10.3 mg/dL   Total Protein 5.8 (L) 6.5 - 8.1 g/dL    Albumin 2.9 (L) 3.5 - 5.0 g/dL   AST 22 15 - 41 U/L   ALT 8 0 - 44 U/L   Alkaline Phosphatase 77 38 - 126 U/L   Total Bilirubin 0.8 0.3 - 1.2 mg/dL   GFR calc non Af Amer >60 >60 mL/min   GFR calc Af Amer >60 >60 mL/min   Anion gap 8 5 - 15    Comment: Performed at [facility], [address] [place], [place] [address]  Ethanol     Status: None   Collection Time: 07/18/19  4:33 PM  Result Value Ref Range   Alcohol, Ethyl (B) <10 <10 mg/dL    Comment: (NOTE) Lowest detectable limit for serum alcohol is 10 mg/dL. For medical purposes only. Performed at [facility], [address] [place], [place] [address]   Brain natriuretic peptide     Status: None   Collection Time: 07/18/19  4:33 PM  Result Value Ref Range   B Natriuretic Peptide 56.0 0.0 - 100.0 pg/mL    Comment: Performed at [facility], [place], [place] [address]  Troponin I (High Sensitivity)     Status: None   Collection Time: 07/18/19  4:33 PM  Result Value Ref Range   Troponin I (High Sensitivity) 5 <18 ng/L    Comment: (NOTE) Elevated high sensitivity troponin I (hsTnI) values and significant  changes across serial measurements may suggest ACS but many other  chronic and acute conditions are known to elevate hsTnI results.  Refer to the "Links" section for chest pain algorithms and additional  guidance. Performed at [facility], [address] [place],  [address]   SARS Coronavirus 2 [facility] order, Performed in [facility] hospital lab) Nasopharyngeal Nasopharyngeal Swab     Status: None   Collection Time: 07/18/19  7:21 PM   Specimen: Nasopharyngeal Swab  Result Value Ref Range   SARS Coronavirus 2 NEGATIVE NEGATIVE    Comment: (NOTE) If result is NEGATIVE SARS-CoV-2 target nucleic acids are NOT DETECTED. The SARS-CoV-2 RNA is generally detectable in upper and lower  respiratory specimens during the acute phase of infection. The lowest   concentration of SARS-CoV-2 viral copies this assay can detect is 250  copies / mL. A negative result does not preclude SARS-CoV-2 infection  and should not be used as the sole basis for treatment or other  patient management decisions.  A negative result may occur with  improper specimen collection / handling, submission of specimen other  than nasopharyngeal swab, presence of viral mutation(s) within the  areas targeted by this assay, and inadequate number of viral copies  (<250 copies / mL). A negative result must be combined with clinical  observations, patient history, and epidemiological information. If result is POSITIVE SARS-CoV-2 target nucleic acids are DETECTED. The SARS-CoV-2 RNA is generally detectable in upper and lower  respiratory specimens dur ing the acute phase of infection.  Positive  results are indicative of active infection with SARS-CoV-2.  Clinical  correlation with patient history and other diagnostic information is  necessary to determine patient infection status.  Positive results do  not rule out bacterial infection or co-infection with other viruses. If result is PRESUMPTIVE POSTIVE SARS-CoV-2 nucleic acids MAY BE PRESENT.   A presumptive positive result was obtained on the submitted specimen  and confirmed on repeat testing.  While 2019 novel coronavirus  (SARS-CoV-2) nucleic acids may be present in the submitted sample  additional confirmatory testing may be necessary for epidemiological  and / or clinical management purposes  to differentiate between  SARS-CoV-2 and other Sarbecovirus currently known to infect humans.  If clinically indicated additional testing with an alternate test  methodology [phone]) is advised. The SARS-CoV-2 RNA is generally  detectable in upper and lower respiratory sp ecimens during the acute  phase of infection. The expected result is Negative. Fact Sheet for Patients:  StrictlyIdeas.no Fact Sheet  for Healthcare Providers: BankingDealers.co.za This test is not yet approved or cleared by the [place] FDA and has been authorized for detection and/or diagnosis of SARS-CoV-2 by FDA under an Emergency Use Authorization (EUA).  This EUA will remain in effect (meaning this test can be used) for the duration of the COVID-19 declaration under Section 564(b)(1) of the Act, 21 U.S.C. section 360bbb-3(b)(1), unless the authorization is terminated or revoked sooner. Performed at [facility], [address] [place], [place] [address]   Urinalysis, Complete w Microscopic     Status: Abnormal   Collection Time: 07/18/19  9:43 PM  Result Value Ref Range   Color, Urine COLORLESS (A) YELLOW   APPearance CLEAR (A) CLEAR   Specific Gravity, Urine 1.005 1.005 - 1.030   pH 7.0 5.0 - 8.0   Glucose, UA NEGATIVE NEGATIVE mg/dL   Hgb urine dipstick NEGATIVE NEGATIVE   Bilirubin Urine NEGATIVE NEGATIVE   Ketones, ur NEGATIVE NEGATIVE mg/dL   Protein, ur NEGATIVE NEGATIVE mg/dL   Nitrite NEGATIVE NEGATIVE   Leukocytes,Ua NEGATIVE NEGATIVE   WBC, UA NONE SEEN 0 - 5 WBC/hpf   Bacteria, UA NONE SEEN NONE SEEN   Squamous Epithelial / LPF NONE SEEN 0 - 5   Mucus PRESENT     Comment: Performed at [facility], [address] [place], [place] [address]   Dg Chest 2 View  Result Date: 07/18/2019 CLINICAL DATA:  Weakness, increased leftward leaning EXAM: CHEST - 2 VIEW COMPARISON:  Chest radiograph Apr 12, 2019, CTA chest February 15, 2019 FINDINGS: Patient is rotated right anterior oblique. Apparent cardiomediastinal enlargement likely related to rotation and portable technique. There is increasing patchy airspace and interstitial opacity with a basilar predominance as well as new left effusion. The pulmonary vascularity is indistinct. The aorta is calcified and tortuous. The bones are diffusely demineralized. Several remote left rib deformities are again seen. No  pneumothorax. IMPRESSION: Worsening patchy airspace and interstitial opacities with a  basilar predominance and new left effusion, most concerning for worsening pulmonary edema though developing infection could have a similar appearance. Electronically Signed   By: Lovena Le M.D.   On: 07/18/2019 16:09   Ct Head Wo Contrast  Result Date: 07/18/2019 CLINICAL DATA:  Leaning to the left, concern for CVA EXAM: CT HEAD WITHOUT CONTRAST TECHNIQUE: Contiguous axial images were obtained from the base of the skull through the vertex without intravenous contrast. COMPARISON:  Head CT Apr 12, 2019 FINDINGS: Brain: No evidence of acute infarction, hemorrhage, hydrocephalus, extra-axial collection or mass lesion/mass effect. Symmetric prominence of the ventricles, cisterns and sulci compatible with parenchymal volume loss. Overall extent is similar to prior. Patchy areas of white matter hypoattenuation are most compatible with chronic microvascular angiopathy. Vascular: Atherosclerotic calcification of the carotid siphons. Skull: No calvarial fracture or suspicious osseous lesion. No scalp swelling or hematoma. Sinuses/Orbits: Paranasal sinuses and mastoid air cells are predominantly clear. Orbital structures are unremarkable. Other: None. IMPRESSION: No acute intracranial abnormality. Stable parenchymal volume loss and chronic microvascular angiopathy. Electronically Signed   By: Lovena Le M.D.   On: 07/18/2019 16:17    Pending Labs [employer] (From admission, onward)    Start     Ordered   Signed and Held  Basic metabolic panel  Daily,   R     Signed and Held          Vitals/Pain Today's Vitals   07/18/19 2115 07/18/19 2126 07/18/19 2150 07/18/19 2215  BP:   (!) 112/58   Pulse: (!) 47 (!) 48 (!) 48 (!) 45  Resp:  18 20 14   Temp:      TempSrc:      SpO2: 100% 99% 100% 99%  Weight:      Height:      PainSc:        Isolation Precautions No active isolations  Medications Medications   furosemide (LASIX) injection 40 mg (40 mg Intravenous Given 07/18/19 1922)    Mobility wheelchair Low fall risk   Focused Assessments weakness to L side inc per group home/PT staff/baseline per pt; stroke scale negative; neuro baseline   R Recommendations: See Admitting Provider Note  Report given to:   Additional Notes:  L ac IV

## 2019-07-18 NOTE — Progress Notes (Signed)
Family Meeting Note  Advance Directive:yes  Today a meeting took place with the Patient.   The following clinical team members were present during this meeting:MD  The following were discussed:Patient's diagnosis: Acute on chronic diastolic CHF, COPD, atrial fibrillation, pulmonary embolism, hypertension, Patient's progosis: Unable to determine and Goals for treatment: DNR  Additional follow-up to be provided: PMD  Time spent during discussion:20 minutes  Vaughan Basta, MD

## 2019-07-18 NOTE — ED Notes (Signed)
Pt noted to be more awake since his son arrived to ED. Pt currently laying bed talking to his son. MD made aware of patient's son concerns regarding patient recently becoming incontinent of stool in addition to urine.

## 2019-07-18 NOTE — ED Notes (Signed)
Pt returned from CT and X-ray.

## 2019-07-18 NOTE — ED Notes (Signed)
Son will be taking pt's denture's home. He is afraid they will be lost again.

## 2019-07-18 NOTE — ED Notes (Signed)
Attempted report to floor.  

## 2019-07-18 NOTE — ED Notes (Signed)
Lab confirms 2nd troponin still in process.

## 2019-07-18 NOTE — ED Triage Notes (Signed)
Pt sent in after physical therapy for increased leaning to the left. Pt is a&ox3 and denies paint.  Negative stroke scale.

## 2019-07-18 NOTE — ED Notes (Signed)
This RN called lab regarding bloodwork. Per Lab, they do not have bloodwork. Will recollect bloodwork and send to lab.

## 2019-07-18 NOTE — ED Notes (Signed)
Lab called about add-on troponin orders. Lab states they will run these now.

## 2019-07-18 NOTE — ED Notes (Signed)
Pt moved from hallway into room 7. External catheter placed by nurse megan. Pt will be admitted. Pt given food tray and drink.

## 2019-07-18 NOTE — ED Provider Notes (Signed)
[facility] Emergency Department Provider Note  ____________________________________________   I have reviewed the triage vital signs and the nursing notes. Where available I have reviewed prior notes and, if possible and indicated, outside hospital notes.   Patient seen and evaluated during the coronavirus epidemic during a time with low staffing  Patient seen for the symptoms described in the history of present illness. She was evaluated in the context of the global COVID-19 pandemic, which necessitated consideration that the patient might be at risk for infection with the SARS-CoV-2 virus that causes COVID-19. Institutional protocols and algorithms that pertain to the evaluation of patients at risk for COVID-19 are in a state of rapid change based on information released by regulatory bodies including the CDC and federal and state organizations. These policies and algorithms were followed during the patient's care in the ED.    HISTORY  Chief Complaint No chief complaint on file.    HPI HOLTON SIDMAN is a 78 y.o. male    He was at physical therapy, patient has history of CHF chronic back pain dementia depression chronic lower extremity edema, poor ambulation, PTSD, PEs in the past, and multiple other medical problems.  He does not have a history of CVA.  He is on blood thinners.  He states that he was feeling tired and PT was tiring him out.  PT thought he was leaning to the left more than normal and they called 911.  Patient denies any focal numbness or weakness in all he wants to do is eat a sandwich and go home.    Past Medical History:  Diagnosis Date  . CHF (congestive heart failure) ([place])   . Chronic back pain   . Dementia ([place])   . Depression   . Emphysema lung ([place])   . Hypertension   . Personal history of tobacco use, presenting hazards to health 08/06/2015  . Pollen allergies   . PTSD (post-traumatic stress disorder)   . Pulmonary embolism [facility])      Patient Active Problem List   Diagnosis Date Noted  . Acute exacerbation of congestive heart failure ([place]) 04/12/2019  . Influenza A 02/15/2019  . Pneumonia 11/27/2018  . Pulmonary emboli ([name]) 11/18/2017  . Tobacco use disorder 07/24/2017  . Encephalopathy   . Palliative care encounter   . Goals of care, counseling/discussion   . Pulmonary embolus ([place])   . UTI (urinary tract infection) 07/19/2017  . PTSD (post-traumatic stress disorder) 06/28/2017  . Adjustment disorder with anxiety 06/28/2017  . Closed compression fracture of L5 lumbar vertebra   . Left low back pain   . Weakness   . A-fib ([place]) 06/24/2017  . Prediabetes 01/18/2017  . Prostate cancer ([place]) 01/18/2017  . Anxiety 01/18/2017  . Preventative health care 04/15/2016  . Restless leg syndrome 04/03/2016  . COPD (chronic obstructive pulmonary disease) ([place]) 04/03/2016  . BPH (benign prostatic hyperplasia) 04/03/2016  . Personal history of tobacco use, presenting hazards to health 08/06/2015    Past Surgical History:  Procedure Laterality Date  . BACK SURGERY    . KYPHOPLASTY N/A 06/27/2017   Procedure: KYPHOPLASTY -L5;  Surgeon: Hessie Knows, MD;  Location: [facility] ORS;  Service: Orthopedics;  Laterality: N/A;  . [place]    Prior to Admission medications   Medication Sig Start Date End Date Taking? Authorizing Provider  acetaminophen (TYLENOL) 650 MG CR tablet Take 650 mg by mouth every 8 (eight) hours as needed for pain.   Yes  [provider]  ADVAIR DISKUS 250-50 MCG/DOSE AEPB Inhale 1 puff into the lungs every 12 (twelve) hours. 04/09/18  Yes [provider]  albuterol (PROVENTIL HFA;VENTOLIN HFA) 108 (90 Base) MCG/ACT inhaler Inhale 2 puffs into the lungs every 4 (four) hours as needed for wheezing or shortness of breath.    Yes [provider]  albuterol (PROVENTIL) (2.5 MG/3ML) 0.083% nebulizer solution Inhale 2.5 mg into the lungs 4 (four) times  daily.  01/15/15  Yes [provider]  amiodarone (PACERONE) 200 MG tablet Two tabs po daily for three more days then take one tablet daily afterwards Patient taking differently: Take 200 mg by mouth daily.  02/23/19  Yes Wieting, Richard, MD  apixaban (ELIQUIS) 5 MG TABS tablet Take 1 tablet (5 mg total) by mouth 2 (two) times daily. 11/28/17  Yes Gladstone Lighter, MD  docusate sodium (COLACE) 100 MG capsule Take 1 capsule (100 mg total) by mouth 2 (two) times daily. 02/23/19 02/23/20 Yes Wieting, Richard, MD  finasteride (PROSCAR) 5 MG tablet Take 1 tablet (5 mg total) by mouth daily. 01/21/19  Yes Festus Aloe, MD  FLUoxetine (PROZAC) 10 MG capsule Take 10 mg by mouth daily.   Yes [provider]  furosemide (LASIX) 20 MG tablet Take 20 mg by mouth every morning.  06/29/18  Yes [provider]  lidocaine (LIDODERM) 5 % Place 1 patch onto the skin daily. Remove & Discard patch within 12 hours or as directed by MD 11/28/18  Yes Gladstone Lighter, MD  memantine (NAMENDA) 5 MG tablet Take 1 tablet (5 mg total) by mouth 2 (two) times daily. 02/23/19  Yes Wieting, Richard, MD  metoprolol tartrate (LOPRESSOR) 50 MG tablet Take 1 tablet (50 mg total) by mouth 2 (two) times daily. Patient taking differently: Take 50 mg by mouth daily.  02/23/19  Yes Wieting, Richard, MD  potassium chloride (K-DUR) 10 MEQ tablet Take 10 mEq by mouth daily.   Yes [provider]  pramipexole (MIRAPEX) 1 MG tablet Take 1 tablet (1 mg total) by mouth at bedtime. 11/22/17  Yes Gladstone Lighter, MD  SPIRIVA HANDIHALER 18 MCG inhalation capsule Place 18 mcg into inhaler and inhale daily. 06/11/18  Yes [provider]  tamsulosin (FLOMAX) 0.4 MG CAPS capsule Take 2 capsules (0.8 mg total) by mouth daily after supper. Patient taking differently: Take 0.4 mg by mouth daily after supper.  02/02/18  Yes Festus Aloe, MD    Allergies Ativan [lorazepam]  Family History  Problem  Relation Age of Onset  . Alcohol abuse Father   . Arthritis Mother   . Heart disease Maternal Grandmother   . Prostate cancer Brother     Social History Social History   Tobacco Use  . Smoking status: Current Every Day Smoker    Packs/day: 0.50    Years: 60.00    Pack years: 30.00    Types: Cigarettes  . Smokeless tobacco: Never Used  Substance Use Topics  . Alcohol use: No    Alcohol/week: 0.0 standard drinks  . Drug use: No    Review of Systems Constitutional: No fever/chills Eyes: No visual changes. ENT: No sore throat. No stiff neck no neck pain Cardiovascular: Denies chest pain. Respiratory: Denies shortness of breath. Gastrointestinal:   no vomiting.  No diarrhea.  No constipation. Genitourinary: Negative for dysuria. Musculoskeletal: Chronic lower extremity swelling Skin: Negative for rash. Neurological: Negative for severe headaches, focal weakness or numbness.   ____________________________________________   PHYSICAL EXAM:  VITAL SIGNS:  ED Triage Vitals  Enc Vitals Group     BP 07/18/19 1418 (!) 105/52     Pulse Rate 07/18/19 1418 (!) 48     Resp 07/18/19 1538 18     Temp 07/18/19 1418 98.3 F (36.8 C)     Temp Source 07/18/19 1418 Oral     SpO2 07/18/19 1418 98 %     Weight 07/18/19 1419 187 lb 6.3 oz (85 kg)     Height 07/18/19 1419 5\' 7"  (1.702 m)     Head Circumference --      Peak Flow --      Pain Score 07/18/19 1418 0     Pain Loc --      Pain Edu? --      Excl. in [place]? --     Constitutional: Alert and oriented to name and place unsure the exact date at baseline per family. Well appearing and in no acute distress. Eyes: Conjunctivae are normal Head: Atraumatic HEENT: No congestion/rhinnorhea. Mucous membranes are moist.  Oropharynx non-erythematous Neck:   Nontender with no meningismus, no masses, no stridor Cardiovascular: Normal rate, regular rhythm. Grossly normal heart sounds.  Good peripheral circulation. Respiratory: Normal  respiratory effort.  No retractions. Lungs CTAB. Abdominal: Soft and nontender. No distention. No guarding no rebound Back:  There is no focal tenderness or step off.  there is no midline tenderness there are no lesions noted. there is no CVA tenderness Musculoskeletal: No lower extremity tenderness, no upper extremity tenderness. No joint effusions, no DVT signs strong distal pulses + edema Neurologic:  Normal speech and language. No gross focal neurologic deficits are appreciated.  Skin:  Skin is warm, dry and intact. No rash noted. Psychiatric: Mood and affect are normal. Speech and behavior are normal.  ____________________________________________   LABS (all labs ordered are listed, but only abnormal results are displayed)  Labs Reviewed  CBC WITH DIFFERENTIAL/PLATELET - Abnormal; Notable for the following components:      Result Value   Hemoglobin 12.7 (*)    Monocytes Absolute 1.1 (*)    All other components within normal limits  COMPREHENSIVE METABOLIC PANEL - Abnormal; Notable for the following components:   Potassium 3.4 (*)    Calcium 8.3 (*)    Total Protein 5.8 (*)    Albumin 2.9 (*)    All other components within normal limits  ETHANOL  URINALYSIS, COMPLETE (UACMP) WITH MICROSCOPIC  CBG MONITORING, ED    Pertinent labs  results that were available during my care of the patient were reviewed by me and considered in my medical decision making (see chart for details). ____________________________________________  EKG  I personally interpreted any EKGs ordered by me or triage  ____Sinus rhythm, rate 48 right bundle branch block no acute ST elevation or depression.  Intraventricular conduction delay noted.  ________________________________________  RADIOLOGY  Pertinent labs & imaging results that were available during my care of the patient were reviewed by me and considered in my medical decision making (see chart for details). If possible, patient and/or family  made aware of any abnormal findings.  Dg Chest 2 View  Result Date: 07/18/2019 CLINICAL DATA:  Weakness, increased leftward leaning EXAM: CHEST - 2 VIEW COMPARISON:  Chest radiograph Apr 12, 2019, CTA chest February 15, 2019 FINDINGS: Patient is rotated right anterior oblique. Apparent cardiomediastinal enlargement likely related to rotation and portable technique. There is increasing patchy airspace and interstitial opacity with a basilar predominance as well as new left effusion. The pulmonary  vascularity is indistinct. The aorta is calcified and tortuous. The bones are diffusely demineralized. Several remote left rib deformities are again seen. No pneumothorax. IMPRESSION: Worsening patchy airspace and interstitial opacities with a basilar predominance and new left effusion, most concerning for worsening pulmonary edema though developing infection could have a similar appearance. Electronically Signed   By: Lovena Le M.D.   On: 07/18/2019 16:09   Ct Head Wo Contrast  Result Date: 07/18/2019 CLINICAL DATA:  Leaning to the left, concern for CVA EXAM: CT HEAD WITHOUT CONTRAST TECHNIQUE: Contiguous axial images were obtained from the base of the skull through the vertex without intravenous contrast. COMPARISON:  Head CT Apr 12, 2019 FINDINGS: Brain: No evidence of acute infarction, hemorrhage, hydrocephalus, extra-axial collection or mass lesion/mass effect. Symmetric prominence of the ventricles, cisterns and sulci compatible with parenchymal volume loss. Overall extent is similar to prior. Patchy areas of white matter hypoattenuation are most compatible with chronic microvascular angiopathy. Vascular: Atherosclerotic calcification of the carotid siphons. Skull: No calvarial fracture or suspicious osseous lesion. No scalp swelling or hematoma. Sinuses/Orbits: Paranasal sinuses and mastoid air cells are predominantly clear. Orbital structures are unremarkable. Other: None. IMPRESSION: No acute intracranial  abnormality. Stable parenchymal volume loss and chronic microvascular angiopathy. Electronically Signed   By: Lovena Le M.D.   On: 07/18/2019 16:17   ____________________________________________    PROCEDURES  Procedure(s) performed: None  Procedures  Critical Care performed: None  ____________________________________________   INITIAL IMPRESSION / ASSESSMENT AND PLAN / ED COURSE  Pertinent labs & imaging results that were available during my care of the patient were reviewed by me and considered in my medical decision making (see chart for details).   Here with no complaints or symptoms except for the fact that he wants a candy bar.  He states he just feels tired during his physical therapy.  He is bedbound.  Family are at bedside, he looks about like he normally likes anything.  We did do a work-up blood work is reassuring, no chest pain no shortness of breath no nausea no vomiting vital signs are noted.  He is a little bit bradycardic at times however his pressure is holding well.  Patient is on a small dose of Lasix.  He is also on metoprolol which I suspect is contributing to his mild low heart rate.  His neurologic exam is reassuring, and again he has no complaints except for that he is hungry CT scan is negative.  Chest x-ray shows mild edema, he has had edema in his legs.  He may need to increase Lasix for a few days.  However oxygen saturation is reassuring here.    ____________________________________________   FINAL CLINICAL IMPRESSION(S) / ED DIAGNOSES  Final diagnoses:  None      This chart was dictated using voice recognition software.  Despite best efforts to proofread,  errors can occur which can change meaning.      Schuyler Amor, MD 07/18/19 2257

## 2019-07-19 DIAGNOSIS — F419 Anxiety disorder, unspecified: Secondary | ICD-10-CM

## 2019-07-19 DIAGNOSIS — F0390 Unspecified dementia without behavioral disturbance: Secondary | ICD-10-CM

## 2019-07-19 DIAGNOSIS — F339 Major depressive disorder, recurrent, unspecified: Secondary | ICD-10-CM

## 2019-07-19 LAB — MRSA PCR SCREENING: MRSA by PCR: NEGATIVE

## 2019-07-19 LAB — BASIC METABOLIC PANEL
Anion gap: 8 (ref 5–15)
BUN: 12 mg/dL (ref 8–23)
CO2: 26 mmol/L (ref 22–32)
Calcium: 8.3 mg/dL — ABNORMAL LOW (ref 8.9–10.3)
Chloride: 106 mmol/L (ref 98–111)
Creatinine, Ser: 0.64 mg/dL (ref 0.61–1.24)
GFR calc Af Amer: >60 mL/min (ref >60)
GFR calc non Af Amer: >60 mL/min (ref >60)
Glucose, Bld: 92 mg/dL (ref 70–99)
Potassium: 3.6 mmol/L (ref 3.5–5.1)
Sodium: 140 mmol/L (ref 135–145)

## 2019-07-19 MED ORDER — ENSURE ENLIVE PO LIQD
237.0000 mL | Freq: Three times a day (TID) | ORAL | Status: DC
  Administered 2019-07-21 – 2019-07-22 (×2): 237 mL via ORAL

## 2019-07-19 MED ORDER — RISPERIDONE 0.25 MG PO TABS
0.2500 mg | ORAL_TABLET | Freq: Every day | ORAL | Status: DC | PRN
  Filled 2019-07-19: qty 1

## 2019-07-19 MED ORDER — ALBUTEROL SULFATE (2.5 MG/3ML) 0.083% IN NEBU
2.5000 mg | INHALATION_SOLUTION | Freq: Three times a day (TID) | RESPIRATORY_TRACT | Status: DC
  Administered 2019-07-19 – 2019-07-22 (×9): 2.5 mg via RESPIRATORY_TRACT
  Filled 2019-07-19 (×10): qty 3

## 2019-07-19 MED ORDER — GABAPENTIN 100 MG PO CAPS: 100.0000 mg | ORAL_CAPSULE | Freq: Two times a day (BID) | ORAL | Status: DC

## 2019-07-19 MED ORDER — ADULT MULTIVITAMIN W/MINERALS CH
1.0000 | ORAL_TABLET | Freq: Every day | ORAL | Status: DC
  Administered 2019-07-20 – 2019-07-22 (×3): 1 via ORAL
  Filled 2019-07-19 (×3): qty 1

## 2019-07-19 MED ORDER — FLUOXETINE HCL 20 MG PO CAPS
20.0000 mg | ORAL_CAPSULE | Freq: Every day | ORAL | Status: DC
  Administered 2019-07-20 – 2019-07-22 (×3): 20 mg via ORAL
  Filled 2019-07-19 (×3): qty 1

## 2019-07-19 NOTE — NC FL2 (Signed)
[place] LEVEL OF CARE SCREENING TOOL     IDENTIFICATION  Patient Name: Ricardo Erickson Birthdate: [birth year] Sex: male Admission Date (Current Location): 07/18/2019  [place] and [place] Number:  Engineering geologist and Address:  [facility], [address] [place], [place] [address]      Provider Number: [id number]  Attending Physician Name and Address:  Epifanio Lesches, MD  Relative Name and Phone Number:       Current Level of Care: Hospital Recommended Level of Care: [place] Prior Approval Number:    Date Approved/Denied:   PASRR Number:    Discharge Plan: (Assisted Living)    Current Diagnoses: Patient Active Problem List   Diagnosis Date Noted  . Dementia ([place]) 07/19/2019  . CHF (congestive heart failure) ([place]) 07/18/2019  . Acute exacerbation of congestive heart failure ([place]) 04/12/2019  . Influenza A 02/15/2019  . Pneumonia 11/27/2018  . Pulmonary emboli ([place]) 11/18/2017  . Tobacco use disorder 07/24/2017  . Encephalopathy   . Palliative care encounter   . Goals of care, counseling/discussion   . Pulmonary embolus ([place])   . UTI (urinary tract infection) 07/19/2017  . PTSD (post-traumatic stress disorder) 06/28/2017  . Adjustment disorder with anxiety 06/28/2017  . Closed compression fracture of L5 lumbar vertebra   . Left low back pain   . Weakness   . A-fib ([place]) 06/24/2017  . Prediabetes 01/18/2017  . Prostate cancer ([place]) 01/18/2017  . Anxiety 01/18/2017  . Preventative health care 04/15/2016  . Restless leg syndrome 04/03/2016  . COPD (chronic obstructive pulmonary disease) ([place]) 04/03/2016  . BPH (benign prostatic hyperplasia) 04/03/2016  . Personal history of tobacco use, presenting hazards to health 08/06/2015    Orientation RESPIRATION BLADDER Height & Weight     Self, Time, Situation, Place  O2 Continent Weight: 78 kg Height:  5\' 7"  (170.2 cm)  BEHAVIORAL SYMPTOMS/MOOD  NEUROLOGICAL BOWEL NUTRITION STATUS      Continent Diet(low sodium)  AMBULATORY STATUS COMMUNICATION OF NEEDS Skin   Limited Assist   Normal                       Personal Care Assistance Level of Assistance  Bathing, Dressing Bathing Assistance: Limited assistance   Dressing Assistance: Limited assistance     Functional Limitations Info  Hearing          SPECIAL CARE FACTORS FREQUENCY  Blood pressure Blood Pressure Frequency: Daily Weights,  Keep records Home health Physical therapy                  Contractures Contractures Info: Not present    Additional Factors Info  Code Status Code Status Info: DNR             Current Medications (07/19/2019):  This is the current hospital active medication list Current Facility-Administered Medications  Medication Dose Route Frequency Provider Last Rate Last Dose  . 0.9 %  sodium chloride infusion  250 mL Intravenous PRN Vaughan Basta, MD      . acetaminophen (TYLENOL) tablet 650 mg  650 mg Oral Q8H PRN Vaughan Basta, MD      . acetaminophen (TYLENOL) tablet 650 mg  650 mg Oral Q4H PRN Vaughan Basta, MD      . albuterol (PROVENTIL) (2.5 MG/3ML) 0.083% nebulizer solution 2.5 mg  2.5 mg Inhalation TID Epifanio Lesches, MD   2.5 mg at 07/19/19 1939  . apixaban (ELIQUIS) tablet 5 mg  5  mg Oral BID Vaughan Basta, MD   5 mg at 07/19/19 1052  . docusate sodium (COLACE) capsule 100 mg  100 mg Oral BID Vaughan Basta, MD   100 mg at 07/19/19 1051  . feeding supplement (ENSURE ENLIVE) (ENSURE ENLIVE) liquid 237 mL  237 mL Oral TID BM Epifanio Lesches, MD      . finasteride (PROSCAR) tablet 5 mg  5 mg Oral Daily Vaughan Basta, MD   5 mg at 07/19/19 1052  . [START ON 07/20/2019] FLUoxetine (PROZAC) capsule 20 mg  20 mg Oral Daily Lord, [place] Y, NP      . lisinopril (ZESTRIL) tablet 5 mg  5 mg Oral Daily Vaughan Basta, MD   5 mg at 07/19/19 1051  . memantine  (NAMENDA) tablet 5 mg  5 mg Oral BID Vaughan Basta, MD   5 mg at 07/19/19 1053  . mometasone-formoterol (DULERA) 200-5 MCG/ACT inhaler 2 puff  2 puff Inhalation BID Vaughan Basta, MD   2 puff at 07/19/19 1258  . [START ON 07/20/2019] multivitamin with minerals tablet 1 tablet  1 tablet Oral Daily Epifanio Lesches, MD      . ondansetron (ZOFRAN) injection 4 mg  4 mg Intravenous Q6H PRN Vaughan Basta, MD      . potassium chloride (K-DUR) CR tablet 10 mEq  10 mEq Oral Daily Vaughan Basta, MD   10 mEq at 07/19/19 1052  . pramipexole (MIRAPEX) tablet 1 mg  1 mg Oral QHS Vaughan Basta, MD   1 mg at 07/19/19 0045  . risperiDONE (RISPERDAL) tablet 0.25 mg  0.25 mg Oral Daily PRN Patrecia Pour, NP      . sodium chloride flush (NS) 0.9 % injection 3 mL  3 mL Intravenous Q12H Vaughan Basta, MD   3 mL at 07/19/19 1055  . sodium chloride flush (NS) 0.9 % injection 3 mL  3 mL Intravenous PRN Vaughan Basta, MD      . tamsulosin (FLOMAX) capsule 0.4 mg  0.4 mg Oral QPC supper Vaughan Basta, MD   0.4 mg at 07/19/19 1800  . tiotropium (SPIRIVA) inhalation capsule ([facility] use ONLY) 18 mcg  18 mcg Inhalation Daily Vaughan Basta, MD   18 mcg at 07/19/19 1054     Discharge Medications: Please see discharge summary for a list of discharge medications.  Relevant Imaging Results:  Relevant Lab Results:   Additional Information Nolberto Cheuvront Carlota Raspberry RN  Katrina Stack, RN

## 2019-07-19 NOTE — Progress Notes (Signed)
Initial Nutrition Assessment  DOCUMENTATION CODES:   Not applicable  INTERVENTION:   Ensure Enlive po BID, each supplement provides 350 kcal and 20 grams of protein  MVI daily   2 gram sodium diet with chopped meats  NUTRITION DIAGNOSIS:   Unintentional weight loss related to chronic illness(COPD, CHF, dementia) as evidenced by 25 percent weight loss over the past year.  GOAL:   Patient will meet greater than or equal to 90% of their needs  MONITOR:   PO intake, Supplement acceptance, Weight trends, Labs, Skin, I & O's  REASON FOR ASSESSMENT:   Malnutrition Screening Tool    ASSESSMENT:   78 y.o. male with a known history of CHF, COPD, chronic back pain, dementia, emphysema, depression, hypertension, pulmonary embolism-on Eliquis, lives in a group home sent to emergency room with shortness of breath and swelling on both legs.  RD working remotely.  Pt with good appetite and oral intake in hospital; pt eating 100% of meals. Pt does wear dentures but pt's family took them home in fear they would be lost. RD will change pt to a 2 gram sodium diet and request chopped meats. RD will also add supplements and MVI to help pt meet his estimated needs. Per chart, pt with 55lb(25%) weight loss over the past year; this is significant.   Medications reviewed and include: colace, lasix, KCl  Labs reviewed:   Unable to complete Nutrition-Focused physical exam at this time.   Diet Order:   Diet Order            Diet 2 gram sodium Room service appropriate? Yes; Fluid consistency: Thin  Diet effective now             EDUCATION NEEDS:   Not appropriate for education at this time  Skin:  Skin Assessment: Reviewed RN Assessment  Last BM:  pta  Height:   Ht Readings from Last 1 Encounters:  07/18/19 5\' 7"  (1.702 m)    Weight:   Wt Readings from Last 1 Encounters:  07/19/19 78 kg    Ideal Body Weight:  67 kg  BMI:  Body mass index is 26.93 kg/m.  Estimated  Nutritional Needs:   Kcal:  1800-2100kcal/day  Protein:  85-100g/day  Fluid:  >1.7L/day  Koleen Distance MS, RD, LDN Pager #- [phone] Office#- [phone] After Hours Pager: [phone]

## 2019-07-19 NOTE — TOC Initial Note (Signed)
Transition of Care [facility]) - Initial/Assessment Note    Patient Details  Name: RANALD ALESSIO MRN: [id number] Date of Birth: [birth year]  Transition of Care [facility]) CM/SW Contact:    Katrina Stack, RN Phone Number: 07/19/2019, 7:45 PM  Clinical Narrative:                Admitted with heart failure.  Resident at [employer].  Clancy Gourd relays that patient can be weighed daily and low sodium diet can be accomodate.  She does say that family brings patient a lot of crackers and chips. Notified Cheryl with [facility] of admission and need for heart failure follow up   Expected Discharge Plan: [place]     Patient Goals and CMS Choice Patient states their goals for this hospitalization and ongoing recovery are:: Go back to [facility] CMS Medicare.gov Compare Post Acute Care list provided to:: Patient Choice offered to / list presented to : NA  Expected Discharge Plan and Services Expected Discharge Plan: [place]       Living arrangements for the past 2 months: Assisted Living Facility                           HH Arranged: RN, PT [facility] Agency: [place] Date [facility] Agency Contacted: 07/19/19   Representative spoke with at [place]: Malachy Mood  Prior Living Arrangements/Services Living arrangements for the past 2 months: [place] Lives with:: Facility Resident Patient language and need for interpreter reviewed:: Yes Do you feel safe going back to the place where you live?: Yes      Need for Family Participation in Patient Care: No (Comment) Care giver support system in place?: Yes (comment) Current home services: Home PT, Home RN Criminal Activity/Legal Involvement Pertinent to Current Situation/Hospitalization: No - Comment as needed  Activities of Daily Living      Permission Sought/Granted Permission sought to share information with : Facility Art therapist granted to share information  with : Yes, Verbal Permission Granted              Emotional Assessment Appearance:: Appears stated age Attitude/Demeanor/Rapport: Engaged Affect (typically observed): Accepting, Appropriate Orientation: : Oriented to Self, Oriented to Place, Oriented to  Time, Oriented to Situation, Fluctuating Orientation (Suspected and/or reported Sundowners)   Psych Involvement: No (comment)  Admission diagnosis:  Weakness [R53.1] Patient Active Problem List   Diagnosis Date Noted  . Dementia ([place]) 07/19/2019  . CHF (congestive heart failure) ([place]) 07/18/2019  . Acute exacerbation of congestive heart failure ([place]) 04/12/2019  . Influenza A 02/15/2019  . Pneumonia 11/27/2018  . Pulmonary emboli ([place]) 11/18/2017  . Tobacco use disorder 07/24/2017  . Encephalopathy   . Palliative care encounter   . Goals of care, counseling/discussion   . Pulmonary embolus ([place])   . UTI (urinary tract infection) 07/19/2017  . PTSD (post-traumatic stress disorder) 06/28/2017  . Adjustment disorder with anxiety 06/28/2017  . Closed compression fracture of L5 lumbar vertebra   . Left low back pain   . Weakness   . A-fib ([place]) 06/24/2017  . Prediabetes 01/18/2017  . Prostate cancer ([place]) 01/18/2017  . Anxiety 01/18/2017  . Preventative health care 04/15/2016  . Restless leg syndrome 04/03/2016  . COPD (chronic obstructive pulmonary disease) ([place]) 04/03/2016  . BPH (benign prostatic hyperplasia) 04/03/2016  . Personal history of tobacco use, presenting hazards to health 08/06/2015   PCP:  Sabra Heck,  Christean Grief, MD Pharmacy:   [facility] [address] [place] - [place] AT [facility] [place] [address] Phone: [phone] Fax: [phone]  [id number] - [place], [place] - [place] AT [place] [address] [place] [address] Phone: [phone] Fax: [phone]     Social Determinants of Health  ([place]) Interventions    Readmission Risk Interventions Readmission Risk Prevention Plan 04/17/2019  Transportation Screening Complete  PCP or Specialist Appt within 3-5 Days Complete  HRI or [place] Complete  Social Work Consult for [place] Planning/Counseling Complete  Palliative Care Screening Not Applicable  Medication Review Press photographer) Complete  Some recent data might be hidden

## 2019-07-19 NOTE — Plan of Care (Signed)
Confused, and reoriented to place and situation.  Repeatedly calling out for assistance.   Bradycardic in the 50's.  Hypotensive at 79/48.  MD aware.   Problem: Education: Goal: Knowledge of General Education information will improve Description: Including pain rating scale, medication(s)/side effects and non-pharmacologic comfort measures 07/19/2019 1825 by Aubery Lapping, RN Outcome: Not Progressing 07/19/2019 1824 by Aubery Lapping, RN Outcome: Not Progressing Note: Confused, and reoriented to place and situation.  Repeatedly calling out for assistance.    Problem: Education: Goal: Ability to demonstrate management of disease process will improve 07/19/2019 1825 by Aubery Lapping, RN Outcome: Not Progressing 07/19/2019 1824 by Aubery Lapping, RN Outcome: Not Progressing Goal: Ability to verbalize understanding of medication therapies will improve 07/19/2019 1825 by Aubery Lapping, RN Outcome: Not Progressing 07/19/2019 1824 by Aubery Lapping, RN Outcome: Not Progressing Goal: Individualized Educational Video(s) 07/19/2019 1825 by Aubery Lapping, RN Outcome: Not Progressing 07/19/2019 1824 by Aubery Lapping, RN Outcome: Not Progressing   Problem: Activity: Goal: Capacity to carry out activities will improve 07/19/2019 1825 by Aubery Lapping, RN Outcome: Not Progressing 07/19/2019 1824 by Aubery Lapping, RN Outcome: Progressing   Problem: Cardiac: Goal: Ability to achieve and maintain adequate cardiopulmonary perfusion will improve 07/19/2019 1825 by Aubery Lapping, RN Outcome: Not Progressing 07/19/2019 1824 by Aubery Lapping, RN Outcome: Progressing

## 2019-07-19 NOTE — Consult Note (Addendum)
[facility] Face-to-Face Psychiatry Consult   Reason for Consult:  Depression, anxiety Referring Physician:  Dr Anselm Jungling Patient Identification: Ricardo Erickson MRN:  [id number] Principal Diagnosis: CHF and anxiety Diagnosis:  Active Problems:   Anxiety   Dementia ([place])   Acute exacerbation of congestive heart failure ([place])   CHF (congestive heart failure) ([place])   Total Time spent with patient: 1 hour  Subjective:   Ricardo Erickson is a 78 y.o. male patient admitted with CHF exacerbation.  "I'm no good, I need to be home.  Let me out of here.  I like these people but want to be home."  Denies suicidal/homicidal ideations, hallucinations, and substance abuse.  He does voice "pain all over", especially in his back.  HPI:  78 yo male admitted for CHF exacerbation, history of dementia/depression/ anxiety.  The sitter reports he was upset about his food at lunch and was hungry.  She was changing him and going to call for something he wanted to eat.  He is anxious on assessment and irritable but not combative or aggressive or threatening to staff.  Cooperative when the sitter asks him to assist with his transfer and other things.  Reports he slept well last night and appetite is good.  Currently he is taking Prozac 10 mg and Namenda, nothing ordered for anxiety--allergy to Ativan  No threat to staff or himself at this time. His symptoms should resolve with food and PRN Risperdal.  Psychiatrically stable.  Past Psychiatric History: dementia, depression, anxiety  Risk to Self:  none Risk to Others:  none Prior Inpatient Therapy:  no psychiatric hospitalization Prior Outpatient Therapy:  PCP  Past Medical History:  Past Medical History:  Diagnosis Date  . CHF (congestive heart failure) ([place])   . Chronic back pain   . Dementia ([place])   . Depression   . Emphysema lung ([place])   . Hypertension   . Personal history of tobacco use, presenting hazards to health 08/06/2015  . Pollen allergies   . PTSD  (post-traumatic stress disorder)   . Pulmonary embolism [facility])     Past Surgical History:  Procedure Laterality Date  . BACK SURGERY    . KYPHOPLASTY N/A 06/27/2017   Procedure: KYPHOPLASTY -L5;  Surgeon: Hessie Knows, MD;  Location: [facility] ORS;  Service: Orthopedics;  Laterality: N/A;  . TONSILLECTOMY AND ADENOIDECTOMY  1980   Family History:  Family History  Problem Relation Age of Onset  . Alcohol abuse Father   . Arthritis Mother   . Heart disease Maternal Grandmother   . Prostate cancer Brother    Family Psychiatric  History: father with alcohol abuse Social History:  Social History   Substance and Sexual Activity  Alcohol Use No  . Alcohol/week: 0.0 standard drinks     Social History   Substance and Sexual Activity  Drug Use No    Social History   Socioeconomic History  . Marital status: Widowed    Spouse name: Not on file  . Number of children: Not on file  . Years of education: Not on file  . Highest education level: Not on file  Occupational History  . Not on file  Social Needs  . Financial resource strain: Not on file  . Food insecurity    Worry: Not on file    Inability: Not on file  . Transportation needs    Medical: Not on file    Non-medical: Not on file  Tobacco Use  . Smoking status: Current Every Day  Smoker    Packs/day: 0.50    Years: 60.00    Pack years: 30.00    Types: Cigarettes  . Smokeless tobacco: Never Used  Substance and Sexual Activity  . Alcohol use: No    Alcohol/week: 0.0 standard drinks  . Drug use: No  . Sexual activity: Not Currently  Lifestyle  . Physical activity    Days per week: Not on file    Minutes per session: Not on file  . Stress: Not on file  Relationships  . Social Herbalist on phone: Not on file    Gets together: Not on file    Attends religious service: Not on file    Active member of club or organization: Not on file    Attends meetings of clubs or organizations: Not on file     Relationship status: Not on file  Other Topics Concern  . Not on file  Social History Narrative   Widower.   2 children, 3 grandchildren, 1 great grandchild.   Retired. Now works as a Gaffer.   Enjoys relaxing, spending time with family, watching basketball.   Additional Social History:    Allergies:   Allergies  Allergen Reactions  . Ativan [Lorazepam] Other (See Comments)    Patient gets severe altered mental status and confusion when taking this medicine    Labs:  Results for orders placed or performed during the hospital encounter of 07/18/19 (from the past 48 hour(s))  CBC with Differential     Status: Abnormal   Collection Time: 07/18/19  4:33 PM  Result Value Ref Range   WBC 10.1 4.0 - 10.5 K/uL   RBC 4.44 4.22 - 5.81 MIL/uL   Hemoglobin 12.7 (L) 13.0 - 17.0 g/dL   HCT 41.0 39.0 - 52.0 %   MCV 92.3 80.0 - 100.0 fL   MCH 28.6 26.0 - 34.0 pg   MCHC 31.0 30.0 - 36.0 g/dL   RDW 14.0 11.5 - 15.5 %   Platelets 266 150 - 400 K/uL   nRBC 0.0 0.0 - 0.2 %   Neutrophils Relative % 71 %   Neutro Abs 7.3 1.7 - 7.7 K/uL   Lymphocytes Relative 16 %   Lymphs Abs 1.6 0.7 - 4.0 K/uL   Monocytes Relative 11 %   Monocytes Absolute 1.1 (H) 0.1 - 1.0 K/uL   Eosinophils Relative 1 %   Eosinophils Absolute 0.1 0.0 - 0.5 K/uL   Basophils Relative 1 %   Basophils Absolute 0.1 0.0 - 0.1 K/uL   Immature Granulocytes 0 %   Abs Immature Granulocytes 0.04 0.00 - 0.07 K/uL    Comment: Performed at [facility], [address] [place], [place] [address]  Comprehensive metabolic panel     Status: Abnormal   Collection Time: 07/18/19  4:33 PM  Result Value Ref Range   Sodium 139 135 - 145 mmol/L   Potassium 3.4 (L) 3.5 - 5.1 mmol/L   Chloride 106 98 - 111 mmol/L   CO2 25 22 - 32 mmol/L   Glucose, Bld 96 70 - 99 mg/dL   BUN 11 8 - 23 mg/dL   Creatinine, Ser 0.70 0.61 - 1.24 mg/dL   Calcium 8.3 (L) 8.9 - 10.3 mg/dL   Total Protein 5.8 (L) 6.5 - 8.1 g/dL   Albumin 2.9 (L) 3.5  - 5.0 g/dL   AST 22 15 - 41 U/L   ALT 8 0 - 44 U/L   Alkaline Phosphatase 77 38 -  126 U/L   Total Bilirubin 0.8 0.3 - 1.2 mg/dL   GFR calc non Af Amer >60 >60 mL/min   GFR calc Af Amer >60 >60 mL/min   Anion gap 8 5 - 15    Comment: Performed at [facility], [address] [place], [place] [address]  Ethanol     Status: None   Collection Time: 07/18/19  4:33 PM  Result Value Ref Range   Alcohol, Ethyl (B) <10 <10 mg/dL    Comment: (NOTE) Lowest detectable limit for serum alcohol is 10 mg/dL. For medical purposes only. Performed at [facility], [address] [place], [place] [address]   Brain natriuretic peptide     Status: None   Collection Time: 07/18/19  4:33 PM  Result Value Ref Range   B Natriuretic Peptide 56.0 0.0 - 100.0 pg/mL    Comment: Performed at [facility], [place], [place] [address]  Troponin I (High Sensitivity)     Status: None   Collection Time: 07/18/19  4:33 PM  Result Value Ref Range   Troponin I (High Sensitivity) 5 <18 ng/L    Comment: (NOTE) Elevated high sensitivity troponin I (hsTnI) values and significant  changes across serial measurements may suggest ACS but many other  chronic and acute conditions are known to elevate hsTnI results.  Refer to the "Links" section for chest pain algorithms and additional  guidance. Performed at [facility], [address] [place], [place] [address]   SARS Coronavirus 2 [facility] order, Performed in [facility] hospital lab) Nasopharyngeal Nasopharyngeal Swab     Status: None   Collection Time: 07/18/19  7:21 PM   Specimen: Nasopharyngeal Swab  Result Value Ref Range   SARS Coronavirus 2 NEGATIVE NEGATIVE    Comment: (NOTE) If result is NEGATIVE SARS-CoV-2 target nucleic acids are NOT DETECTED. The SARS-CoV-2 RNA is generally detectable in upper and lower  respiratory specimens during the acute phase of infection. The lowest  concentration of  SARS-CoV-2 viral copies this assay can detect is 250  copies / mL. A negative result does not preclude SARS-CoV-2 infection  and should not be used as the sole basis for treatment or other  patient management decisions.  A negative result may occur with  improper specimen collection / handling, submission of specimen other  than nasopharyngeal swab, presence of viral mutation(s) within the  areas targeted by this assay, and inadequate number of viral copies  (<250 copies / mL). A negative result must be combined with clinical  observations, patient history, and epidemiological information. If result is POSITIVE SARS-CoV-2 target nucleic acids are DETECTED. The SARS-CoV-2 RNA is generally detectable in upper and lower  respiratory specimens dur ing the acute phase of infection.  Positive  results are indicative of active infection with SARS-CoV-2.  Clinical  correlation with patient history and other diagnostic information is  necessary to determine patient infection status.  Positive results do  not rule out bacterial infection or co-infection with other viruses. If result is PRESUMPTIVE POSTIVE SARS-CoV-2 nucleic acids MAY BE PRESENT.   A presumptive positive result was obtained on the submitted specimen  and confirmed on repeat testing.  While 2019 novel coronavirus  (SARS-CoV-2) nucleic acids may be present in the submitted sample  additional confirmatory testing may be necessary for epidemiological  and / or clinical management purposes  to differentiate between  SARS-CoV-2 and other Sarbecovirus currently known to infect humans.  If clinically indicated additional testing  with an alternate test  methodology [phone]) is advised. The SARS-CoV-2 RNA is generally  detectable in upper and lower respiratory sp ecimens during the acute  phase of infection. The expected result is Negative. Fact Sheet for Patients:  StrictlyIdeas.no Fact Sheet for Healthcare  Providers: BankingDealers.co.za This test is not yet approved or cleared by the [place] FDA and has been authorized for detection and/or diagnosis of SARS-CoV-2 by FDA under an Emergency Use Authorization (EUA).  This EUA will remain in effect (meaning this test can be used) for the duration of the COVID-19 declaration under Section 564(b)(1) of the Act, 21 U.S.C. section 360bbb-3(b)(1), unless the authorization is terminated or revoked sooner. Performed at [facility], [address] [place], [place] [address]   Urinalysis, Complete w Microscopic     Status: Abnormal   Collection Time: 07/18/19  9:43 PM  Result Value Ref Range   Color, Urine COLORLESS (A) YELLOW   APPearance CLEAR (A) CLEAR   Specific Gravity, Urine 1.005 1.005 - 1.030   pH 7.0 5.0 - 8.0   Glucose, UA NEGATIVE NEGATIVE mg/dL   Hgb urine dipstick NEGATIVE NEGATIVE   Bilirubin Urine NEGATIVE NEGATIVE   Ketones, ur NEGATIVE NEGATIVE mg/dL   Protein, ur NEGATIVE NEGATIVE mg/dL   Nitrite NEGATIVE NEGATIVE   Leukocytes,Ua NEGATIVE NEGATIVE   WBC, UA NONE SEEN 0 - 5 WBC/hpf   Bacteria, UA NONE SEEN NONE SEEN   Squamous Epithelial / LPF NONE SEEN 0 - 5   Mucus PRESENT     Comment: Performed at [facility], [place], [place] [address]  Troponin I (High Sensitivity)     Status: None   Collection Time: 07/18/19  9:43 PM  Result Value Ref Range   Troponin I (High Sensitivity) 7 <18 ng/L    Comment: (NOTE) Elevated high sensitivity troponin I (hsTnI) values and significant  changes across serial measurements may suggest ACS but many other  chronic and acute conditions are known to elevate hsTnI results.  Refer to the "Links" section for chest pain algorithms and additional  guidance. Performed at [facility], [address] [place], [place] [address]   MRSA PCR Screening     Status: None   Collection Time: 07/19/19  1:09 AM   Specimen:  Nasal Mucosa; Nasopharyngeal  Result Value Ref Range   MRSA by PCR NEGATIVE NEGATIVE    Comment:        The GeneXpert MRSA Assay (FDA approved for NASAL specimens only), is one component of a comprehensive MRSA colonization surveillance program. It is not intended to diagnose MRSA infection nor to guide or monitor treatment for MRSA infections. Performed at [facility], [address] [place], [place] [address]   Basic metabolic panel     Status: Abnormal   Collection Time: 07/19/19  6:50 AM  Result Value Ref Range   Sodium 140 135 - 145 mmol/L   Potassium 3.6 3.5 - 5.1 mmol/L   Chloride 106 98 - 111 mmol/L   CO2 26 22 - 32 mmol/L   Glucose, Bld 92 70 - 99 mg/dL   BUN 12 8 - 23 mg/dL   Creatinine, Ser 0.64 0.61 - 1.24 mg/dL   Calcium 8.3 (L) 8.9 - 10.3 mg/dL   GFR calc non Af Amer >60 >60 mL/min   GFR calc Af Amer >60 >60 mL/min   Anion gap 8 5 - 15    Comment: Performed at [facility], [address] [name], [place] [address]    Current Facility-Administered Medications  Medication Dose Route Frequency Provider Last Rate Last Dose  . 0.9 %  sodium chloride infusion  250 mL Intravenous PRN Vaughan Basta, MD      . acetaminophen (TYLENOL) tablet 650 mg  650 mg Oral Q8H PRN Vaughan Basta, MD      . acetaminophen (TYLENOL) tablet 650 mg  650 mg Oral Q4H PRN Vaughan Basta, MD      . albuterol (PROVENTIL) (2.5 MG/3ML) 0.083% nebulizer solution 2.5 mg  2.5 mg Inhalation TID Epifanio Lesches, MD   2.5 mg at 07/19/19 1339  . apixaban (ELIQUIS) tablet 5 mg  5 mg Oral BID Vaughan Basta, MD   5 mg at 07/19/19 1052  . docusate sodium (COLACE) capsule 100 mg  100 mg Oral BID Vaughan Basta, MD   100 mg at 07/19/19 1051  . feeding supplement (ENSURE ENLIVE) (ENSURE ENLIVE) liquid 237 mL  237 mL Oral TID BM Epifanio Lesches, MD      . finasteride (PROSCAR) tablet 5 mg  5 mg Oral Daily Vaughan Basta, MD   5  mg at 07/19/19 1052  . FLUoxetine (PROZAC) capsule 10 mg  10 mg Oral Daily Vaughan Basta, MD   10 mg at 07/19/19 1053  . lisinopril (ZESTRIL) tablet 5 mg  5 mg Oral Daily Vaughan Basta, MD   5 mg at 07/19/19 1051  . memantine (NAMENDA) tablet 5 mg  5 mg Oral BID Vaughan Basta, MD   5 mg at 07/19/19 1053  . mometasone-formoterol (DULERA) 200-5 MCG/ACT inhaler 2 puff  2 puff Inhalation BID Vaughan Basta, MD   2 puff at 07/19/19 1258  . [START ON 07/20/2019] multivitamin with minerals tablet 1 tablet  1 tablet Oral Daily Epifanio Lesches, MD      . ondansetron (ZOFRAN) injection 4 mg  4 mg Intravenous Q6H PRN Vaughan Basta, MD      . potassium chloride (K-DUR) CR tablet 10 mEq  10 mEq Oral Daily Vaughan Basta, MD   10 mEq at 07/19/19 1052  . pramipexole (MIRAPEX) tablet 1 mg  1 mg Oral QHS Vaughan Basta, MD   1 mg at 07/19/19 0045  . sodium chloride flush (NS) 0.9 % injection 3 mL  3 mL Intravenous Q12H Vaughan Basta, MD   3 mL at 07/19/19 1055  . sodium chloride flush (NS) 0.9 % injection 3 mL  3 mL Intravenous PRN Vaughan Basta, MD      . tamsulosin (FLOMAX) capsule 0.4 mg  0.4 mg Oral QPC supper Vaughan Basta, MD      . tiotropium [facility]) inhalation capsule ([facility] use ONLY) 18 mcg  18 mcg Inhalation Daily Vaughan Basta, MD   18 mcg at 07/19/19 1054    Musculoskeletal: Strength & Muscle Tone: decreased Gait & Station: did not witness Patient leans: N/A  Psychiatric Specialty Exam: Physical Exam  Nursing note and vitals reviewed. Constitutional: He is oriented to person, place, and time. He appears well-developed and well-nourished.  HENT:  Head: Normocephalic.  Neck: Normal range of motion.  Respiratory: Effort normal.  Musculoskeletal: Normal range of motion.  Neurological: He is alert and oriented to person, place, and time.  Psychiatric: His behavior is normal. Judgment and thought  content normal. His mood appears anxious. His affect is blunt. His speech is slurred. Cognition and memory are impaired.    Review of Systems  Constitutional: Positive for malaise/fatigue.  Musculoskeletal: Positive for back pain.  Neurological: Positive for weakness.  Psychiatric/Behavioral: Positive for memory loss. The patient is nervous/anxious.   All other systems reviewed and are negative.   Blood pressure (!) 78/49, pulse (!) 51, temperature 98 F (36.7 C), temperature source Oral, resp. rate 19, height 5\' 7"  (1.702 m), weight 78 kg, SpO2 98 %.Body mass index is 26.93 kg/m.  General Appearance: Casual  Eye Contact:  Good  Speech:  Slurred  Volume:  Normal  Mood:  Anxious and Irritable  Affect:  Blunt  Thought Process:  Coherent and Descriptions of Associations: Intact  Orientation:  Full (Time, Place, and Person)  Thought Content:  Logical  Suicidal Thoughts:  No  Homicidal Thoughts:  No  Memory:  Immediate;   Fair Recent;   Poor Remote;   Fair  Judgement:  Impaired  Insight:  Lacking  Psychomotor Activity:  Normal  Concentration:  Concentration: Fair and Attention Span: Fair  Recall:  [employer] of Knowledge:  Fair  Language:  Poor  Akathisia:  No  Handed:  Right  AIMS (if indicated):     Assets:  Housing Resilience Social Support  ADL's:  Impaired  Cognition:  Impaired,  Mild to moderate  Sleep:       Treatment Plan Summary: Dementia: -Continue Namenda 5 mg BID  Anxiety: -Increase Prozac 10 mg daily to 20 mg daily -Start Risperdal 0.25 mg daily PRN  Disposition: No evidence of imminent risk to self or others at present.   Supportive therapy provided about ongoing stressors.  Waylan Boga, NP 07/19/2019 4:06 PM

## 2019-07-19 NOTE — Progress Notes (Signed)
[facility] [place] NAME: Ricardo Erickson    MR#:  [id number]  DATE OF BIRTH:  [birth year]  SUBJECTIVE: Patient is seen at bedside, has history of chronic back pain, dementia, depression and from group home.  Admitted for CHF exacerbation.  Patient also found to have bradycardia.  CHIEF COMPLAINT:  No chief complaint on file. Shortness of breath improved compared to yesterday.  REVIEW OF SYSTEMS:   ROS CONSTITUTIONAL: No fever, fatigue or weakness.  EYES: No blurred or double vision.  EARS, NOSE, AND THROAT: No tinnitus or ear pain.  RESPIRATORY: No cough, shortness of breath, wheezing or hemoptysis.  CARDIOVASCULAR: No chest pain, orthopnea, edema.  GASTROINTESTINAL: No nausea, vomiting, diarrhea or abdominal pain.  GENITOURINARY: No dysuria, hematuria.  ENDOCRINE: No polyuria, nocturia,  HEMATOLOGY: No anemia, easy bruising or bleeding SKIN: No rash or lesion. MUSCULOSKELETAL: No joint pain or arthritis.   NEUROLOGIC: No tingling, numbness, weakness.  PSYCHIATRY: No anxiety or depression.   DRUG ALLERGIES:   Allergies  Allergen Reactions  . Ativan [Lorazepam] Other (See Comments)    Patient gets severe altered mental status and confusion when taking this medicine    VITALS:  Blood pressure (!) 78/49, pulse (!) 51, temperature 98 F (36.7 C), temperature source Oral, resp. rate 19, height 5\' 7"  (1.702 m), weight 78 kg, SpO2 98 %.  PHYSICAL EXAMINATION:  GENERAL:  78 y.o.-year-old patient lying in the bed with no acute distress.  EYES: Pupils equal, round, reactive to light and accommodation. No scleral icterus. Extraocular muscles intact.  HEENT: Head atraumatic, normocephalic. Oropharynx and nasopharynx clear.  NECK:  Supple, no jugular venous distention. No thyroid enlargement, no tenderness.  LUNGS: Diminished breath sounds at base.   CARDIOVASCULAR: S1, S2 normal. No murmurs, rubs, or gallops.  ABDOMEN: Soft,  nontender, nondistended. Bowel sounds present. No organomegaly or mass.  EXTREMITIES: No pedal edema, cyanosis, or clubbing.  NEUROLOGIC: Cranial nerves II through XII are intact. Muscle strength 5/5 in all extremities. Sensation intact. Gait not checked.  PSYCHIATRIC: The patient is alert and oriented x 3.  Agitated at times. SKIN: No obvious rash, lesion, or ulcer.    LABORATORY PANEL:   CBC Recent Labs  Lab 07/18/19 1633  WBC 10.1  HGB 12.7*  HCT 41.0  PLT 266   ------------------------------------------------------------------------------------------------------------------  Chemistries  Recent Labs  Lab 07/18/19 1633 07/19/19 0650  NA 139 140  K 3.4* 3.6  CL 106 106  CO2 25 26  GLUCOSE 96 92  BUN 11 12  CREATININE 0.70 0.64  CALCIUM 8.3* 8.3*  AST 22  --   ALT 8  --   ALKPHOS 77  --   BILITOT 0.8  --    ------------------------------------------------------------------------------------------------------------------  Cardiac Enzymes No results for input(s): TROPONINI in the last 168 hours. ------------------------------------------------------------------------------------------------------------------  RADIOLOGY:  Dg Chest 2 View  Result Date: 07/18/2019 CLINICAL DATA:  Weakness, increased leftward leaning EXAM: CHEST - 2 VIEW COMPARISON:  Chest radiograph Apr 12, 2019, CTA chest February 15, 2019 FINDINGS: Patient is rotated right anterior oblique. Apparent cardiomediastinal enlargement likely related to rotation and portable technique. There is increasing patchy airspace and interstitial opacity with a basilar predominance as well as new left effusion. The pulmonary vascularity is indistinct. The aorta is calcified and tortuous. The bones are diffusely demineralized. Several remote left rib deformities are again seen. No pneumothorax. IMPRESSION: Worsening patchy airspace and interstitial opacities with a basilar predominance and new left effusion, most concerning for  worsening pulmonary edema though developing infection could have a similar appearance. Electronically Signed   By: Lovena Le M.D.   On: 07/18/2019 16:09   Ct Head Wo Contrast  Result Date: 07/18/2019 CLINICAL DATA:  Leaning to the left, concern for CVA EXAM: CT HEAD WITHOUT CONTRAST TECHNIQUE: Contiguous axial images were obtained from the base of the skull through the vertex without intravenous contrast. COMPARISON:  Head CT Apr 12, 2019 FINDINGS: Brain: No evidence of acute infarction, hemorrhage, hydrocephalus, extra-axial collection or mass lesion/mass effect. Symmetric prominence of the ventricles, cisterns and sulci compatible with parenchymal volume loss. Overall extent is similar to prior. Patchy areas of white matter hypoattenuation are most compatible with chronic microvascular angiopathy. Vascular: Atherosclerotic calcification of the carotid siphons. Skull: No calvarial fracture or suspicious osseous lesion. No scalp swelling or hematoma. Sinuses/Orbits: Paranasal sinuses and mastoid air cells are predominantly clear. Orbital structures are unremarkable. Other: None. IMPRESSION: No acute intracranial abnormality. Stable parenchymal volume loss and chronic microvascular angiopathy. Electronically Signed   By: Lovena Le M.D.   On: 07/18/2019 16:17    EKG:   Orders placed or performed during the hospital encounter of 07/18/19  . EKG 12-Lead  . EKG 12-Lead  . EKG 12-Lead  . EKG 12-Lead  . ED EKG  . ED EKG    ASSESSMENT AND PLAN:   78 year old male with a history of for COPD, PE on Eliquis, chronic atrial fibrillation, history of depression, dementia admitted because of congestive heart failure exacerbation, bradycardia.   #1. acute respiratory failure secondary to acute on chronic diastolic heart failure, patient clinically is better, on IV diuretics, hypoxia resolved, room air saturation 96%.  Due to hypotension will hold Lasix. 2.  Bradycardia likely secondary to beta-blockers,  amiodarone, hold both, cardiology consult requested. 3.  History of dementia, depression, patient is from group home, psychiatric medicines restarted, namely Prozac, Namenda, Mirapex, psychiatric consult requested. #4 history of COPD, no wheezing, continue Spiriva, albuterol. 5.  History of BPH, continue Proscar, Flomax.  All the records are reviewed and case discussed with Care Management/Social Workerr. Management plans discussed with the patient, family and they are in agreement.  CODE STATUS: DNR TOTAL TIME TAKING CARE OF THIS PATIENT: 35 minutes.  More than 50% time is spent reviewing the charts, counseling and coordination of care. POSSIBLE D/C IN 1-2 DAYS, DEPENDING ON CLINICAL CONDITION.   Epifanio Lesches M.D on 07/19/2019 at 2:47 PM  Between 7am to 6pm - Pager - [phone]  After 6pm go to [email or web address] - password [email or web address] [facility]  Office  [phone]  CC: Primary care physician; Rusty Aus, MD   Note: This dictation was prepared with Dragon dictation along with smaller phrase technology. Any transcriptional errors that result from this process are unintentional.

## 2019-07-20 LAB — BASIC METABOLIC PANEL
Anion gap: 7 (ref 5–15)
BUN: 10 mg/dL (ref 8–23)
CO2: 25 mmol/L (ref 22–32)
Calcium: 8.5 mg/dL — ABNORMAL LOW (ref 8.9–10.3)
Chloride: 108 mmol/L (ref 98–111)
Creatinine, Ser: 0.68 mg/dL (ref 0.61–1.24)
GFR calc Af Amer: >60 mL/min (ref >60)
GFR calc non Af Amer: >60 mL/min (ref >60)
Glucose, Bld: 114 mg/dL — ABNORMAL HIGH (ref 70–99)
Potassium: 3.8 mmol/L (ref 3.5–5.1)
Sodium: 140 mmol/L (ref 135–145)

## 2019-07-20 LAB — CBC
HCT: 41.5 % (ref 39.0–52.0)
Hemoglobin: 12.8 g/dL — ABNORMAL LOW (ref 13.0–17.0)
MCH: 28.4 pg (ref 26.0–34.0)
MCHC: 30.8 g/dL (ref 30.0–36.0)
MCV: 92.2 fL (ref 80.0–100.0)
Platelets: 253 10*3/uL (ref 150–400)
RBC: 4.5 MIL/uL (ref 4.22–5.81)
RDW: 14.2 % (ref 11.5–15.5)
WBC: 8.7 10*3/uL (ref 4.0–10.5)
nRBC: 0 % (ref 0.0–0.2)

## 2019-07-20 MED ORDER — RISPERIDONE 0.25 MG PO TABS
0.2500 mg | ORAL_TABLET | Freq: Every day | ORAL | Status: DC
  Administered 2019-07-20 – 2019-07-21 (×2): 0.25 mg via ORAL
  Filled 2019-07-20 (×3): qty 1

## 2019-07-20 NOTE — Plan of Care (Signed)
  Problem: Education: Goal: Knowledge of General Education information will improve Description: Including pain rating scale, medication(s)/side effects and non-pharmacologic comfort measures Outcome: Progressing   Problem: Education: Goal: Ability to demonstrate management of disease process will improve Outcome: Not Progressing Note: Patient cognitively impaired. Resides at a group home. Will continue to monitor psychological status for the remainder of the shift. [name] [facility]

## 2019-07-20 NOTE — Progress Notes (Signed)
Per Dr. Verdell Carmine, it's OK for patient to go without a PIV for now. Patient ripped old one out, violently. Ricardo Erickson [facility]

## 2019-07-20 NOTE — Consult Note (Addendum)
[facility] Face-to-Face Psychiatry Consult   Reason for Consult:  Depression, anxiety Referring Physician:  Dr Anselm Jungling Patient Identification: Ricardo Erickson MRN:  [id number] Principal Diagnosis: CHF and anxiety Diagnosis:  Active Problems:   Anxiety   Dementia ([place])   Acute exacerbation of congestive heart failure ([place])   CHF (congestive heart failure) ([place])   Total Time spent with patient: 35 minutes  Subjective:   Ricardo Erickson is a 78 y.o. male patient admitted with CHF exacerbation.  "I'm better.  I just talked to my son and he always gets me up.  He's going to try and get me out of here today."  HPI:  78 yo male admitted for CHF exacerbation, history of dementia/depression/ anxiety.  Seen and evaluated in person, patient sitting up in bed, just finished eating his breakfast 100% eaten.  Smiling, pleasant.  His Prozac was increased yesterday with no negative side effects.  His physical pain has improved today with some lingering weakness and fatigue.  Reports his sleep was "good", denies any issues or concerns at this time.  No suicidal/homicidal ideations, hallucinations, or substance abuse.  He is returning to his facility today. Psychiatrically stable.  Past Psychiatric History: dementia, depression, anxiety  Risk to Self:  none Risk to Others:  none Prior Inpatient Therapy:  no psychiatric hospitalization Prior Outpatient Therapy:  PCP  Past Medical History:  Past Medical History:  Diagnosis Date  . CHF (congestive heart failure) ([place])   . Chronic back pain   . Dementia ([place])   . Depression   . Emphysema lung ([place])   . Hypertension   . Personal history of tobacco use, presenting hazards to health 08/06/2015  . Pollen allergies   . PTSD (post-traumatic stress disorder)   . Pulmonary embolism [facility])     Past Surgical History:  Procedure Laterality Date  . BACK SURGERY    . KYPHOPLASTY N/A 06/27/2017   Procedure: KYPHOPLASTY -L5;  Surgeon: Hessie Knows, MD;  Location: [facility]  ORS;  Service: Orthopedics;  Laterality: N/A;  . TONSILLECTOMY AND ADENOIDECTOMY  1980   Family History:  Family History  Problem Relation Age of Onset  . Alcohol abuse Father   . Arthritis Mother   . Heart disease Maternal Grandmother   . Prostate cancer Brother    Family Psychiatric  History: father with alcohol abuse Social History:  Social History   Substance and Sexual Activity  Alcohol Use No  . Alcohol/week: 0.0 standard drinks     Social History   Substance and Sexual Activity  Drug Use No    Social History   Socioeconomic History  . Marital status: Widowed    Spouse name: Not on file  . Number of children: Not on file  . Years of education: Not on file  . Highest education level: Not on file  Occupational History  . Not on file  Social Needs  . Financial resource strain: Not on file  . Food insecurity    Worry: Not on file    Inability: Not on file  . Transportation needs    Medical: Not on file    Non-medical: Not on file  Tobacco Use  . Smoking status: Current Every Day Smoker    Packs/day: 0.50    Years: 60.00    Pack years: 30.00    Types: Cigarettes  . Smokeless tobacco: Never Used  Substance and Sexual Activity  . Alcohol use: No    Alcohol/week: 0.0 standard drinks  . Drug use: No  .  Sexual activity: Not Currently  Lifestyle  . Physical activity    Days per week: Not on file    Minutes per session: Not on file  . Stress: Not on file  Relationships  . Social Herbalist on phone: Not on file    Gets together: Not on file    Attends religious service: Not on file    Active member of club or organization: Not on file    Attends meetings of clubs or organizations: Not on file    Relationship status: Not on file  Other Topics Concern  . Not on file  Social History Narrative   Widower.   2 children, 3 grandchildren, 1 great grandchild.   Retired. Now works as a Gaffer.   Enjoys relaxing, spending time with family, watching  basketball.   Additional Social History:    Allergies:   Allergies  Allergen Reactions  . Ativan [Lorazepam] Other (See Comments)    Patient gets severe altered mental status and confusion when taking this medicine    Labs:  Results for orders placed or performed during the hospital encounter of 07/18/19 (from the past 48 hour(s))  CBC with Differential     Status: Abnormal   Collection Time: 07/18/19  4:33 PM  Result Value Ref Range   WBC 10.1 4.0 - 10.5 K/uL   RBC 4.44 4.22 - 5.81 MIL/uL   Hemoglobin 12.7 (L) 13.0 - 17.0 g/dL   HCT 41.0 39.0 - 52.0 %   MCV 92.3 80.0 - 100.0 fL   MCH 28.6 26.0 - 34.0 pg   MCHC 31.0 30.0 - 36.0 g/dL   RDW 14.0 11.5 - 15.5 %   Platelets 266 150 - 400 K/uL   nRBC 0.0 0.0 - 0.2 %   Neutrophils Relative % 71 %   Neutro Abs 7.3 1.7 - 7.7 K/uL   Lymphocytes Relative 16 %   Lymphs Abs 1.6 0.7 - 4.0 K/uL   Monocytes Relative 11 %   Monocytes Absolute 1.1 (H) 0.1 - 1.0 K/uL   Eosinophils Relative 1 %   Eosinophils Absolute 0.1 0.0 - 0.5 K/uL   Basophils Relative 1 %   Basophils Absolute 0.1 0.0 - 0.1 K/uL   Immature Granulocytes 0 %   Abs Immature Granulocytes 0.04 0.00 - 0.07 K/uL    Comment: Performed at [facility], [address] [place], [place] [address]  Comprehensive metabolic panel     Status: Abnormal   Collection Time: 07/18/19  4:33 PM  Result Value Ref Range   Sodium 139 135 - 145 mmol/L   Potassium 3.4 (L) 3.5 - 5.1 mmol/L   Chloride 106 98 - 111 mmol/L   CO2 25 22 - 32 mmol/L   Glucose, Bld 96 70 - 99 mg/dL   BUN 11 8 - 23 mg/dL   Creatinine, Ser 0.70 0.61 - 1.24 mg/dL   Calcium 8.3 (L) 8.9 - 10.3 mg/dL   Total Protein 5.8 (L) 6.5 - 8.1 g/dL   Albumin 2.9 (L) 3.5 - 5.0 g/dL   AST 22 15 - 41 U/L   ALT 8 0 - 44 U/L   Alkaline Phosphatase 77 38 - 126 U/L   Total Bilirubin 0.8 0.3 - 1.2 mg/dL   GFR calc non Af Amer >60 >60 mL/min   GFR calc Af Amer >60 >60 mL/min   Anion gap 8 5 - 15    Comment: Performed at  [facility], [address] [place],  [place] [address]  Ethanol     Status: None   Collection Time: 07/18/19  4:33 PM  Result Value Ref Range   Alcohol, Ethyl (B) <10 <10 mg/dL    Comment: (NOTE) Lowest detectable limit for serum alcohol is 10 mg/dL. For medical purposes only. Performed at [facility], [address] [place], [place] [address]   Brain natriuretic peptide     Status: None   Collection Time: 07/18/19  4:33 PM  Result Value Ref Range   B Natriuretic Peptide 56.0 0.0 - 100.0 pg/mL    Comment: Performed at [facility], [place], [place] [address]  Troponin I (High Sensitivity)     Status: None   Collection Time: 07/18/19  4:33 PM  Result Value Ref Range   Troponin I (High Sensitivity) 5 <18 ng/L    Comment: (NOTE) Elevated high sensitivity troponin I (hsTnI) values and significant  changes across serial measurements may suggest ACS but many other  chronic and acute conditions are known to elevate hsTnI results.  Refer to the "Links" section for chest pain algorithms and additional  guidance. Performed at [facility], [address] [place], [place] [address]   SARS Coronavirus 2 [facility] order, Performed in [facility] hospital lab) Nasopharyngeal Nasopharyngeal Swab     Status: None   Collection Time: 07/18/19  7:21 PM   Specimen: Nasopharyngeal Swab  Result Value Ref Range   SARS Coronavirus 2 NEGATIVE NEGATIVE    Comment: (NOTE) If result is NEGATIVE SARS-CoV-2 target nucleic acids are NOT DETECTED. The SARS-CoV-2 RNA is generally detectable in upper and lower  respiratory specimens during the acute phase of infection. The lowest  concentration of SARS-CoV-2 viral copies this assay can detect is 250  copies / mL. A negative result does not preclude SARS-CoV-2 infection  and should not be used as the sole basis for treatment or other  patient management decisions.  A negative result may occur with   improper specimen collection / handling, submission of specimen other  than nasopharyngeal swab, presence of viral mutation(s) within the  areas targeted by this assay, and inadequate number of viral copies  (<250 copies / mL). A negative result must be combined with clinical  observations, patient history, and epidemiological information. If result is POSITIVE SARS-CoV-2 target nucleic acids are DETECTED. The SARS-CoV-2 RNA is generally detectable in upper and lower  respiratory specimens dur ing the acute phase of infection.  Positive  results are indicative of active infection with SARS-CoV-2.  Clinical  correlation with patient history and other diagnostic information is  necessary to determine patient infection status.  Positive results do  not rule out bacterial infection or co-infection with other viruses. If result is PRESUMPTIVE POSTIVE SARS-CoV-2 nucleic acids MAY BE PRESENT.   A presumptive positive result was obtained on the submitted specimen  and confirmed on repeat testing.  While 2019 novel coronavirus  (SARS-CoV-2) nucleic acids may be present in the submitted sample  additional confirmatory testing may be necessary for epidemiological  and / or clinical management purposes  to differentiate between  SARS-CoV-2 and other Sarbecovirus currently known to infect humans.  If clinically indicated additional testing with an alternate test  methodology [phone]) is advised. The SARS-CoV-2 RNA is generally  detectable in upper and lower respiratory sp ecimens during the acute  phase of infection. The expected result is Negative. Fact Sheet for Patients:  StrictlyIdeas.no Fact Sheet for Healthcare Providers: BankingDealers.co.za This test is not yet  approved or cleared by the [place] and has been authorized for detection and/or diagnosis of SARS-CoV-2 by FDA under an Emergency Use Authorization (EUA).  This EUA will  remain in effect (meaning this test can be used) for the duration of the COVID-19 declaration under Section 564(b)(1) of the Act, 21 U.S.C. section 360bbb-3(b)(1), unless the authorization is terminated or revoked sooner. Performed at [facility], [address] [place], [place] [address]   Urinalysis, Complete w Microscopic     Status: Abnormal   Collection Time: 07/18/19  9:43 PM  Result Value Ref Range   Color, Urine COLORLESS (A) YELLOW   APPearance CLEAR (A) CLEAR   Specific Gravity, Urine 1.005 1.005 - 1.030   pH 7.0 5.0 - 8.0   Glucose, UA NEGATIVE NEGATIVE mg/dL   Hgb urine dipstick NEGATIVE NEGATIVE   Bilirubin Urine NEGATIVE NEGATIVE   Ketones, ur NEGATIVE NEGATIVE mg/dL   Protein, ur NEGATIVE NEGATIVE mg/dL   Nitrite NEGATIVE NEGATIVE   Leukocytes,Ua NEGATIVE NEGATIVE   WBC, UA NONE SEEN 0 - 5 WBC/hpf   Bacteria, UA NONE SEEN NONE SEEN   Squamous Epithelial / LPF NONE SEEN 0 - 5   Mucus PRESENT     Comment: Performed at [facility], [place], [place] [address]  Troponin I (High Sensitivity)     Status: None   Collection Time: 07/18/19  9:43 PM  Result Value Ref Range   Troponin I (High Sensitivity) 7 <18 ng/L    Comment: (NOTE) Elevated high sensitivity troponin I (hsTnI) values and significant  changes across serial measurements may suggest ACS but many other  chronic and acute conditions are known to elevate hsTnI results.  Refer to the "Links" section for chest pain algorithms and additional  guidance. Performed at [facility], [address] [place], [place] [address]   MRSA PCR Screening     Status: None   Collection Time: 07/19/19  1:09 AM   Specimen: Nasal Mucosa; Nasopharyngeal  Result Value Ref Range   MRSA by PCR NEGATIVE NEGATIVE    Comment:        The GeneXpert MRSA Assay (FDA approved for NASAL specimens only), is one component of a comprehensive MRSA colonization surveillance program. It is  not intended to diagnose MRSA infection nor to guide or monitor treatment for MRSA infections. Performed at [facility], [address] [place], [place] [address]   Basic metabolic panel     Status: Abnormal   Collection Time: 07/19/19  6:50 AM  Result Value Ref Range   Sodium 140 135 - 145 mmol/L   Potassium 3.6 3.5 - 5.1 mmol/L   Chloride 106 98 - 111 mmol/L   CO2 26 22 - 32 mmol/L   Glucose, Bld 92 70 - 99 mg/dL   BUN 12 8 - 23 mg/dL   Creatinine, Ser 0.64 0.61 - 1.24 mg/dL   Calcium 8.3 (L) 8.9 - 10.3 mg/dL   GFR calc non Af Amer >60 >60 mL/min   GFR calc Af Amer >60 >60 mL/min   Anion gap 8 5 - 15    Comment: Performed at [facility], [address] [place], [place] [address]  Basic metabolic panel     Status: Abnormal   Collection Time: 07/20/19  5:22 AM  Result Value Ref Range   Sodium 140 135 - 145 mmol/L   Potassium 3.8 3.5 - 5.1 mmol/L   Chloride 108 98 - 111 mmol/L  CO2 25 22 - 32 mmol/L   Glucose, Bld 114 (H) 70 - 99 mg/dL   BUN 10 8 - 23 mg/dL   Creatinine, Ser 0.68 0.61 - 1.24 mg/dL   Calcium 8.5 (L) 8.9 - 10.3 mg/dL   GFR calc non Af Amer >60 >60 mL/min   GFR calc Af Amer >60 >60 mL/min   Anion gap 7 5 - 15    Comment: Performed at [facility], [address] [place], [place] [address]  CBC     Status: Abnormal   Collection Time: 07/20/19  5:22 AM  Result Value Ref Range   WBC 8.7 4.0 - 10.5 K/uL   RBC 4.50 4.22 - 5.81 MIL/uL   Hemoglobin 12.8 (L) 13.0 - 17.0 g/dL   HCT 41.5 39.0 - 52.0 %   MCV 92.2 80.0 - 100.0 fL   MCH 28.4 26.0 - 34.0 pg   MCHC 30.8 30.0 - 36.0 g/dL   RDW 14.2 11.5 - 15.5 %   Platelets 253 150 - 400 K/uL   nRBC 0.0 0.0 - 0.2 %    Comment: Performed at [facility], [address] [place],  [address]    Current Facility-Administered Medications  Medication Dose Route Frequency Provider Last Rate Last Dose  . 0.9 %  sodium chloride infusion  250 mL Intravenous PRN Vaughan Basta, MD      . acetaminophen (TYLENOL) tablet 650 mg  650 mg Oral Q8H PRN Vaughan Basta, MD      . acetaminophen (TYLENOL) tablet 650 mg  650 mg Oral Q4H PRN Vaughan Basta, MD      . albuterol (PROVENTIL) (2.5 MG/3ML) 0.083% nebulizer solution 2.5 mg  2.5 mg Inhalation TID Epifanio Lesches, MD   2.5 mg at 07/20/19 0730  . apixaban (ELIQUIS) tablet 5 mg  5 mg Oral BID Vaughan Basta, MD   5 mg at 07/20/19 0940  . docusate sodium (COLACE) capsule 100 mg  100 mg Oral BID Vaughan Basta, MD   100 mg at 07/20/19 0941  . feeding supplement (ENSURE ENLIVE) (ENSURE ENLIVE) liquid 237 mL  237 mL Oral TID BM Epifanio Lesches, MD      . finasteride (PROSCAR) tablet 5 mg  5 mg Oral Daily Vaughan Basta, MD   5 mg at 07/20/19 0941  . FLUoxetine (PROZAC) capsule 20 mg  20 mg Oral Daily Patrecia Pour, NP   20 mg at 07/20/19 0941  . lisinopril (ZESTRIL) tablet 5 mg  5 mg Oral Daily Vaughan Basta, MD   5 mg at 07/20/19 0941  . memantine (NAMENDA) tablet 5 mg  5 mg Oral BID Vaughan Basta, MD   5 mg at 07/20/19 0941  . mometasone-formoterol (DULERA) 200-5 MCG/ACT inhaler 2 puff  2 puff Inhalation BID Vaughan Basta, MD   2 puff at 07/20/19 0834  . multivitamin with minerals tablet 1 tablet  1 tablet Oral Daily Epifanio Lesches, MD   1 tablet at 07/20/19 0940  . ondansetron (ZOFRAN) injection 4 mg  4 mg Intravenous Q6H PRN Vaughan Basta, MD      . potassium chloride (K-DUR) CR tablet 10 mEq  10 mEq Oral Daily Vaughan Basta, MD   10 mEq at 07/20/19 0940  . pramipexole (MIRAPEX) tablet 1 mg  1 mg Oral QHS Vaughan Basta, MD   1 mg at 07/19/19 2131  . risperiDONE (RISPERDAL) tablet 0.25 mg  0.25 mg Oral Daily PRN Patrecia Pour, NP      .  sodium chloride flush (NS) 0.9 % injection 3 mL  3 mL Intravenous Q12H Vaughan Basta, MD   3 mL at 07/20/19 0940  . sodium chloride flush (NS) 0.9 % injection 3  mL  3 mL Intravenous PRN Vaughan Basta, MD      . tamsulosin (FLOMAX) capsule 0.4 mg  0.4 mg Oral QPC supper Vaughan Basta, MD   0.4 mg at 07/19/19 1800  . tiotropium (SPIRIVA) inhalation capsule ([facility] use ONLY) 18 mcg  18 mcg Inhalation Daily Vaughan Basta, MD   18 mcg at 07/20/19 0940    Musculoskeletal: Strength & Muscle Tone: decreased Gait & Station: did not witness Patient leans: N/A  Psychiatric Specialty Exam: Physical Exam  Nursing note and vitals reviewed. Constitutional: He is oriented to person, place, and time. He appears well-developed and well-nourished.  HENT:  Head: Normocephalic.  Neck: Normal range of motion.  Respiratory: Effort normal.  Musculoskeletal: Normal range of motion.  Neurological: He is alert and oriented to person, place, and time.  Psychiatric: His behavior is normal. Judgment and thought content normal. His affect is blunt. His speech is slurred. Cognition and memory are impaired.  Slightly slurred today    Review of Systems  Constitutional: Positive for malaise/fatigue.  Musculoskeletal: Negative.   Neurological: Positive for weakness.  Psychiatric/Behavioral: Positive for memory loss.  All other systems reviewed and are negative.   Blood pressure 119/65, pulse (!) 51, temperature 97.8 F (36.6 C), temperature source Oral, resp. rate 18, height 5\' 7"  (1.702 m), weight 78 kg, SpO2 94 %.Body mass index is 26.93 kg/m.  General Appearance: Casual  Eye Contact:  Good  Speech:  Slurred, slightly  Volume:  Normal  Mood:  Euthymic  Affect:  Blunt  Thought Process:  Coherent and Descriptions of Associations: Intact  Orientation:  Full (Time, Place, and Person)  Thought Content:  Logical  Suicidal Thoughts:  No  Homicidal Thoughts:  No  Memory:  Immediate;   Fair Recent;   Poor Remote;   Fair  Judgement:  Fair  Insight:  Fair  Psychomotor Activity:  Normal  Concentration:  Concentration: Fair and Attention Span:  Fair  Recall:  [employer] of Knowledge:  Fair  Language:  Fair  Akathisia:  No  Handed:  Right  AIMS (if indicated):     Assets:  Housing Resilience Social Support  ADL's:  Impaired  Cognition:  Impaired,  Mild   Sleep:       Treatment Plan Summary: Dementia: -Continue Namenda 5 mg BID  Anxiety: -Continue 20 mg daily -Continue Risperdal 0.25 mg daily PRN  Disposition: No evidence of imminent risk to self or others at present.   Supportive therapy provided about ongoing stressors.  Waylan Boga, NP 07/20/2019 9:49 AM

## 2019-07-20 NOTE — Progress Notes (Signed)
[facility] NAME: Ricardo Erickson    MR#:  [id number]  DATE OF BIRTH:  1941/01/04  SUBJECTIVE:   Patient mental status is at baseline.  No other acute events overnight.  Patient remains bradycardic, patient's son is at bedside.  REVIEW OF SYSTEMS:    Review of Systems  Unable to perform ROS: Mental acuity    Nutrition: 2 gm Sodium Tolerating Diet: Yes Tolerating PT: Await Eval.   DRUG ALLERGIES:   Allergies  Allergen Reactions  . Ativan [Lorazepam] Other (See Comments)    Patient gets severe altered mental status and confusion when taking this medicine    VITALS:  Blood pressure 119/65, pulse (!) 51, temperature 97.8 F (36.6 C), temperature source Oral, resp. rate 18, height 5\' 7"  (1.702 m), weight 78 kg, SpO2 94 %.  PHYSICAL EXAMINATION:   Physical Exam  GENERAL:  78 y.o.-year-old patient lying in bed in a contracted position leaning to the left in NAD.   EYES: Pupils equal, round, reactive to light and accommodation. No scleral icterus. Extraocular muscles intact.  HEENT: Head atraumatic, normocephalic. Oropharynx and nasopharynx clear.  NECK:  Supple, no jugular venous distention. No thyroid enlargement, no tenderness.  LUNGS: poor Resp. effort, no wheezing, rales, rhonchi. No use of accessory muscles of respiration.  CARDIOVASCULAR: S1, S2 normal. No murmurs, rubs, or gallops.  ABDOMEN: Soft, nontender, nondistended. Bowel sounds present. No organomegaly or mass.  EXTREMITIES: No cyanosis, clubbing or edema b/l.    NEUROLOGIC: Cranial nerves II through XII are intact. No focal Motor or sensory deficits b/l. Globally weak  PSYCHIATRIC: The patient is alert and oriented x 1.  SKIN: No obvious rash, lesion, or ulcer.    LABORATORY PANEL:   CBC Recent Labs  Lab 07/20/19 0522  WBC 8.7  HGB 12.8*  HCT 41.5  PLT 253    ------------------------------------------------------------------------------------------------------------------  Chemistries  Recent Labs  Lab 07/18/19 1633  07/20/19 0522  NA 139   < > 140  K 3.4*   < > 3.8  CL 106   < > 108  CO2 25   < > 25  GLUCOSE 96   < > 114*  BUN 11   < > 10  CREATININE 0.70   < > 0.68  CALCIUM 8.3*   < > 8.5*  AST 22  --   --   ALT 8  --   --   ALKPHOS 77  --   --   BILITOT 0.8  --   --    < > = values in this interval not displayed.   ------------------------------------------------------------------------------------------------------------------  Cardiac Enzymes No results for input(s): TROPONINI in the last 168 hours. ------------------------------------------------------------------------------------------------------------------  RADIOLOGY:  Ct Head Wo Contrast  Result Date: 07/18/2019 CLINICAL DATA:  Leaning to the left, concern for CVA EXAM: CT HEAD WITHOUT CONTRAST TECHNIQUE: Contiguous axial images were obtained from the base of the skull through the vertex without intravenous contrast. COMPARISON:  Head CT Apr 12, 2019 FINDINGS: Brain: No evidence of acute infarction, hemorrhage, hydrocephalus, extra-axial collection or mass lesion/mass effect. Symmetric prominence of the ventricles, cisterns and sulci compatible with parenchymal volume loss. Overall extent is similar to prior. Patchy areas of white matter hypoattenuation are most compatible with chronic microvascular angiopathy. Vascular: Atherosclerotic calcification of the carotid siphons. Skull: No calvarial fracture or suspicious osseous lesion. No scalp swelling or hematoma. Sinuses/Orbits: Paranasal sinuses and mastoid air cells are predominantly clear. Orbital structures are unremarkable.  Other: None. IMPRESSION: No acute intracranial abnormality. Stable parenchymal volume loss and chronic microvascular angiopathy. Electronically Signed   By: Lovena Le M.D.   On: 07/18/2019 16:17      ASSESSMENT AND PLAN:   78 year old male with past medical history of dementia, PE, COPD, depression, CHF, PTSD who presented to the hospital due to altered mental status.  1.  Acute respiratory failure with hypoxia-secondary to mild CHF. - Patient initially diuresed with IV Lasix and responded but became slightly hypotensive therefore Lasix has been held. - O2 sats have improved, will continue to monitor.  2.  CHF-acute on chronic diastolic CHF. -Improved with diuresis.  Hypoxia resolved.  Diuretics held due to some relative hypotension. -Clinically euvolemic  3.  Bradycardia- patient developed some sinus bradycardia. -Continue to hold beta-blocker and amiodarone for now.  Hemodynamically stable presently.  4.  History of dementia/depression/PTSD- patient has episodes of sundowning at nighttime. - Continue Prozac, Namenda. - Continue Risperdal at bedtime.  Appreciate psychiatric input.  5.  Essential hypertension-continue lisinopril.  6.  History of chronic atrial fibrillation-rate controlled.  Patient is presently bradycardic.  Hold amiodarone and metoprolol.  Continue Eliquis.  7.  BPH-continue finasteride, Flomax.  8.  COPD-no acute exacerbation.  Continue Dulera, Spiriva, albuterol nebulizers as needed.  Discussed plan of care with patient's son at bedside.  All the records are reviewed and case discussed with Care Management/Social Worker. Management plans discussed with the patient, family and they are in agreement.  CODE STATUS: DNR  DVT Prophylaxis: Eliquis  TOTAL TIME TAKING CARE OF THIS PATIENT: 30 minutes.   POSSIBLE D/C IN 1-2 DAYS, DEPENDING ON CLINICAL CONDITION.   Henreitta Leber M.D on 07/20/2019 at 4:08 PM  Between 7am to 6pm - Pager - [phone]  After 6pm go to [email or web address] - Proofreader  [facility]  Office  [phone]  CC: Primary care physician; Rusty Aus, MD

## 2019-07-21 LAB — BASIC METABOLIC PANEL
Anion gap: 7 (ref 5–15)
BUN: 11 mg/dL (ref 8–23)
CO2: 24 mmol/L (ref 22–32)
Calcium: 8.2 mg/dL — ABNORMAL LOW (ref 8.9–10.3)
Chloride: 107 mmol/L (ref 98–111)
Creatinine, Ser: 0.61 mg/dL (ref 0.61–1.24)
GFR calc Af Amer: >60 mL/min (ref >60)
GFR calc non Af Amer: >60 mL/min (ref >60)
Glucose, Bld: 106 mg/dL — ABNORMAL HIGH (ref 70–99)
Potassium: 3.6 mmol/L (ref 3.5–5.1)
Sodium: 138 mmol/L (ref 135–145)

## 2019-07-21 NOTE — Plan of Care (Signed)
  Problem: Education: Goal: Knowledge of General Education information will improve Description: Including pain rating scale, medication(s)/side effects and non-pharmacologic comfort measures Outcome: Progressing   Problem: Cardiac: Goal: Ability to achieve and maintain adequate cardiopulmonary perfusion will improve Outcome: Progressing

## 2019-07-21 NOTE — Plan of Care (Signed)

## 2019-07-21 NOTE — Progress Notes (Signed)
[place] at [place] NAME: Ricardo Erickson    MR#:  [id number]  DATE OF BIRTH:  1941/08/13  SUBJECTIVE:   No acute events overnight hemodynamically stable.  No episodes of agitation/sundowning overnight.  REVIEW OF SYSTEMS:    Review of Systems  Unable to perform ROS: Mental acuity    Nutrition: 2 gm Sodium Tolerating Diet: Yes Tolerating PT: Await Eval.   DRUG ALLERGIES:   Allergies  Allergen Reactions  . Ativan [Lorazepam] Other (See Comments)    Patient gets severe altered mental status and confusion when taking this medicine    VITALS:  Blood pressure 135/65, pulse (!) 53, temperature 98.3 F (36.8 C), temperature source Oral, resp. rate 18, height 5\' 7"  (1.702 m), weight 78.8 kg, SpO2 100 %.  PHYSICAL EXAMINATION:   Physical Exam  GENERAL:  78 y.o.-year-old patient lying in bed in a contracted position leaning to the left in NAD.   EYES: Pupils equal, round, reactive to light and accommodation. No scleral icterus. Extraocular muscles intact.  HEENT: Head atraumatic, normocephalic. Oropharynx and nasopharynx clear.  NECK:  Supple, no jugular venous distention. No thyroid enlargement, no tenderness.  LUNGS: poor Resp. effort, no wheezing, rales, rhonchi. No use of accessory muscles of respiration.  CARDIOVASCULAR: S1, S2 normal. No murmurs, rubs, or gallops.  ABDOMEN: Soft, nontender, nondistended. Bowel sounds present. No organomegaly or mass.  EXTREMITIES: No cyanosis, clubbing or edema b/l.    NEUROLOGIC: Cranial nerves II through XII are intact. No focal Motor or sensory deficits b/l. Globally weak  PSYCHIATRIC: The patient is alert and oriented x 1.  SKIN: No obvious rash, lesion, or ulcer.    LABORATORY PANEL:   CBC Recent Labs  Lab 07/20/19 0522  WBC 8.7  HGB 12.8*  HCT 41.5  PLT 253   ------------------------------------------------------------------------------------------------------------------   Chemistries  Recent Labs  Lab 07/18/19 1633  07/21/19 0522  NA 139   < > 138  K 3.4*   < > 3.6  CL 106   < > 107  CO2 25   < > 24  GLUCOSE 96   < > 106*  BUN 11   < > 11  CREATININE 0.70   < > 0.61  CALCIUM 8.3*   < > 8.2*  AST 22  --   --   ALT 8  --   --   ALKPHOS 77  --   --   BILITOT 0.8  --   --    < > = values in this interval not displayed.   ------------------------------------------------------------------------------------------------------------------  Cardiac Enzymes No results for input(s): TROPONINI in the last 168 hours. ------------------------------------------------------------------------------------------------------------------  RADIOLOGY:  No results found.   ASSESSMENT AND PLAN:   78 year old male with past medical history of dementia, PE, COPD, depression, CHF, PTSD who presented to the hospital due to altered mental status.  1.  Acute respiratory failure with hypoxia-secondary to mild CHF. - Patient initially diuresed with IV Lasix and responded but became slightly hypotensive therefore Lasix currently being held.   - O2 sats have improved and wean as tolerated.   2.  CHF-acute on chronic diastolic CHF. -Improved with diuresis.  Hypoxia resolved.  Diuretics held due to some relative hypotension. -Clinically euvolemic  3.  Bradycardia- patient developed some sinus bradycardia. -Continue to hold beta-blocker and amiodarone for now.  Hemodynamically stable presently.  4.  History of dementia/depression/PTSD- patient has episodes of sundowning at nighttime. - Continue Prozac, Namenda. - cont.  Risperdal at bedtime and it has helped.  Appreciate psychiatric input.  5.  Essential hypertension-continue lisinopril. - BP stable.   6.  History of chronic atrial fibrillation-rate controlled.  Patient is presently bradycardic.  Cont. to Hold amiodarone and metoprolol.  Continue Eliquis.  7.  BPH-continue finasteride, Flomax. - no urinary retention.    8.  COPD-no acute exacerbation.  Continue Dulera, Spiriva, albuterol nebulizers as needed.  Discharge the patient assisted living tomorrow.  All the records are reviewed and case discussed with Care Management/Social Worker. Management plans discussed with the patient, family and they are in agreement.  CODE STATUS: DNR  DVT Prophylaxis: Eliquis  TOTAL TIME TAKING CARE OF THIS PATIENT: 30 minutes.   POSSIBLE D/C IN 1-2 DAYS, DEPENDING ON CLINICAL CONDITION.   Henreitta Leber M.D on 07/21/2019 at 2:57 PM  Between 7am to 6pm - Pager - [phone]  After 6pm go to [email or web address] - Proofreader  [facility]  Office  [phone]  CC: Primary care physician; Rusty Aus, MD

## 2019-07-22 LAB — BASIC METABOLIC PANEL
Anion gap: 6 (ref 5–15)
BUN: 10 mg/dL (ref 8–23)
CO2: 25 mmol/L (ref 22–32)
Calcium: 8.5 mg/dL — ABNORMAL LOW (ref 8.9–10.3)
Chloride: 104 mmol/L (ref 98–111)
Creatinine, Ser: 0.63 mg/dL (ref 0.61–1.24)
GFR calc Af Amer: >60 mL/min (ref >60)
GFR calc non Af Amer: >60 mL/min (ref >60)
Glucose, Bld: 111 mg/dL — ABNORMAL HIGH (ref 70–99)
Potassium: 3.9 mmol/L (ref 3.5–5.1)
Sodium: 135 mmol/L (ref 135–145)

## 2019-07-22 MED ORDER — LISINOPRIL 5 MG PO TABS: 5.0000 mg | ORAL_TABLET | Freq: Every day | ORAL | 0 refills | Status: AC

## 2019-07-22 MED ORDER — RISPERIDONE 0.25 MG PO TABS: 0.2500 mg | ORAL_TABLET | Freq: Every day | ORAL | 0 refills | Status: AC

## 2019-07-22 NOTE — Plan of Care (Signed)
Pt ready for discharge.    Problem: Education: Goal: Knowledge of General Education information will improve Description: Including pain rating scale, medication(s)/side effects and non-pharmacologic comfort measures Outcome: Completed/Met   Problem: Education: Goal: Ability to demonstrate management of disease process will improve Outcome: Completed/Met Goal: Ability to verbalize understanding of medication therapies will improve Outcome: Completed/Met Goal: Individualized Educational Video(s) Outcome: Completed/Met   Problem: Activity: Goal: Capacity to carry out activities will improve Outcome: Completed/Met   Problem: Cardiac: Goal: Ability to achieve and maintain adequate cardiopulmonary perfusion will improve Outcome: Completed/Met   Problem: Unintentional Weight Loss (-3.2) Goal: Food and/or nutrient delivery Description: Individualized approach for food/nutrient provision. Outcome: Completed/Met

## 2019-07-22 NOTE — TOC Transition Note (Addendum)
Transition of Care [facility]) - CM/SW Discharge Note   Patient Details  Name: BRAZEN DOMANGUE MRN: [id number] Date of Birth: May 22, 1941  Transition of Care [facility]) CM/SW Contact:  Ross Ludwig, LCSW Phone Number: 07/22/2019, 12:13 PM   Clinical Narrative:     Patient will be discharging back to [facility] with memory care.  Patient has been staying at [facility] for a few years.  Patient plans to return back to ALF with home health PT and RN.  CSW notified patient's son [name] and he is aware that patient will be discharging today.   Final next level of care: Assisted Living Barriers to Discharge: Barriers Resolved   Patient Goals and CMS Choice Patient states their goals for this hospitalization and ongoing recovery are:: To return back to [facility], and continue with home health PT and RN. CMS Medicare.gov Compare Post Acute Care list provided to:: Patient Represenative (must comment) Choice offered to / list presented to : [place] / Guardian  Discharge Placement   Patient to be d/c'ed today to [facility] [address] Patient and family agreeable to plans will transport via ems RN to call report to [facility] [phone].  CSW updated patient's son [name], and he is aware that patient is discharging today.               Patient chooses bed at: [facility]. Patient to be transferred to facility by: [facility] Name of family member notified: Trinidad Curet Patient and family notified of of transfer: 07/22/19  Discharge Plan and Services                DME Arranged: N/A DME Agency: NA       HH Arranged: RN, PT [place] Agency: [place] Date [place]: 07/22/19 Time [place]: 1212 Representative spoke with at [place]: [place] ([place]) Interventions     Readmission Risk Interventions Readmission Risk Prevention Plan 04/17/2019  Transportation Screening Complete  PCP or Specialist Appt  within 3-5 Days Complete  HRI or [place] Complete  Social Work Consult for [place] Planning/Counseling Complete  Palliative Care Screening Not Applicable  Medication Review Press photographer) Complete  Some recent data might be hidden

## 2019-07-22 NOTE — Plan of Care (Signed)
  Problem: Education: Goal: Ability to verbalize understanding of medication therapies will improve Outcome: Progressing   

## 2019-07-22 NOTE — Care Management Important Message (Signed)
Important Message  Patient Details  Name: Ricardo Erickson MRN: [id number] Date of Birth: 1941/08/28   Medicare Important Message Given:  Yes     Juliann Pulse A Aundria Bitterman 07/22/2019, 12:03 PM

## 2019-07-22 NOTE — Discharge Summary (Addendum)
[place] at [place] NAME: Ricardo Erickson    MR#:  [id number]  DATE OF BIRTH:  [birth year]  DATE OF ADMISSION:  07/18/2019 ADMITTING PHYSICIAN: Vaughan Basta, MD  DATE OF DISCHARGE: 07/22/2019  PRIMARY CARE PHYSICIAN: Rusty Aus, MD    ADMISSION DIAGNOSIS:  Weakness [R53.1]  DISCHARGE DIAGNOSIS:  Active Problems:   Anxiety   Acute exacerbation of congestive heart failure (HCC)   CHF (congestive heart failure) (HCC)   Dementia (HCC)   SECONDARY DIAGNOSIS:   Past Medical History:  Diagnosis Date  . CHF (congestive heart failure) ([place])   . Chronic back pain   . Dementia ([place])   . Depression   . Emphysema lung ([place])   . Hypertension   . Personal history of tobacco use, presenting hazards to health 08/06/2015  . Pollen allergies   . PTSD (post-traumatic stress disorder)   . Pulmonary embolism [facility])     HOSPITAL COURSE:   78 year old male with past medical history of dementia, PE, COPD, depression, CHF, PTSD who presented to the hospital due to altered mental status.  1.  Acute respiratory failure with hypoxia-secondary to mild CHF. - Patient initially diuresed with IV Lasix and responded but became slightly hypotensive therefore Lasix  Was stopped. BP has improved and lasix can be resumed upon discharge today.  - pt. Has been weaned off O2 now.   2.  CHF-acute on chronic diastolic CHF. -Patient was diuresed with IV Lasix and has improved.  He became hypotensive and therefore his Lasix was discontinued for a brief period of time. -Blood pressures have improved now.  Patient will resume his Lasix, beta-blocker has been stopped due to some bradycardia and patient was started on a low-dose ACE inhibitor. -Clinically patient is euvolemic now.  3.  Bradycardia- patient developed some sinus bradycardia. -She remained hemodynamically stable.  Patient's amiodarone and metoprolol were held.  Heart rates have improved but still  remain labile in the low 60s to low 70s.  Will resume patient's amiodarone but hold off on metoprolol.  If heart rates improve as an outpatient down the road metoprolol can be resumed.  4.  History of Multi-infarct dementia with behavioral disturbance/depression/PTSD-  patient has episodes of sundowning agitation and therefore psychiatric consult was obtained. -Patient will continue his Prozac, Namenda, low-dose Risperdal was added at bedtime for sundowning which seems to have helped.  Patient will be discharged on that.  5.  Essential hypertension- pt. Will continue lisinopril. - off Metoprolol due to bradycardia.   6.  History of chronic atrial fibrillation- patient was on amiodarone and metoprolol prior to coming to the hospital.  He was bradycardic and therefore both were held.  Patient will resume his amiodarone for rate control. -We will continue his Eliquis.  Heart rates are currently stable.    7.  BPH-continue finasteride, Flomax. - no urinary retention.   8.  COPD-no acute exacerbation.   -Patient will continue his Advair, Spiriva, albuterol inhaler as needed.  DISCHARGE CONDITIONS:   Stable.   CONSULTS OBTAINED:    DRUG ALLERGIES:   Allergies  Allergen Reactions  . Ativan [Lorazepam] Other (See Comments)    Patient gets severe altered mental status and confusion when taking this medicine    DISCHARGE MEDICATIONS:   Allergies as of 07/22/2019      Reactions   Ativan [lorazepam] Other (See Comments)   Patient gets severe altered mental status and confusion when taking this medicine  Medication List    STOP taking these medications   metoprolol tartrate 50 MG tablet Commonly known as: LOPRESSOR     TAKE these medications   acetaminophen 650 MG CR tablet Commonly known as: TYLENOL Take 650 mg by mouth every 8 (eight) hours as needed for pain.   Advair Diskus 250-50 MCG/DOSE Aepb Generic drug: Fluticasone-Salmeterol Inhale 1 puff into the lungs every  12 (twelve) hours.   albuterol 108 (90 Base) MCG/ACT inhaler Commonly known as: VENTOLIN HFA Inhale 2 puffs into the lungs every 4 (four) hours as needed for wheezing or shortness of breath.   albuterol (2.5 MG/3ML) 0.083% nebulizer solution Commonly known as: PROVENTIL Inhale 2.5 mg into the lungs 4 (four) times daily.   amiodarone 200 MG tablet Commonly known as: PACERONE Two tabs po daily for three more days then take one tablet daily afterwards What changed:   how much to take  how to take this  when to take this  additional instructions   apixaban 5 MG Tabs tablet Commonly known as: ELIQUIS Take 1 tablet (5 mg total) by mouth 2 (two) times daily.   docusate sodium 100 MG capsule Commonly known as: Colace Take 1 capsule (100 mg total) by mouth 2 (two) times daily.   finasteride 5 MG tablet Commonly known as: Proscar Take 1 tablet (5 mg total) by mouth daily.   FLUoxetine 10 MG capsule Commonly known as: PROZAC Take 10 mg by mouth daily.   furosemide 20 MG tablet Commonly known as: LASIX Take 20 mg by mouth every morning.   lidocaine 5 % Commonly known as: LIDODERM Place 1 patch onto the skin daily. Remove & Discard patch within 12 hours or as directed by MD   lisinopril 5 MG tablet Commonly known as: ZESTRIL Take 1 tablet (5 mg total) by mouth daily. Start taking on: July 23, 2019   memantine 5 MG tablet Commonly known as: NAMENDA Take 1 tablet (5 mg total) by mouth 2 (two) times daily.   potassium chloride 10 MEQ tablet Commonly known as: K-DUR Take 10 mEq by mouth daily.   pramipexole 1 MG tablet Commonly known as: MIRAPEX Take 1 tablet (1 mg total) by mouth at bedtime.   risperiDONE 0.25 MG tablet Commonly known as: RISPERDAL Take 1 tablet (0.25 mg total) by mouth at bedtime.   Spiriva HandiHaler 18 MCG inhalation capsule Generic drug: tiotropium Place 18 mcg into inhaler and inhale daily.   tamsulosin 0.4 MG Caps capsule Commonly  known as: FLOMAX Take 2 capsules (0.8 mg total) by mouth daily after supper. What changed: how much to take         DISCHARGE INSTRUCTIONS:   DIET:  Cardiac diet  DISCHARGE CONDITION:  Stable  ACTIVITY:  Activity as tolerated  OXYGEN:  Home Oxygen: No.   Oxygen Delivery: room air  DISCHARGE LOCATION:  Assited Living    If you experience worsening of your admission symptoms, develop shortness of breath, life threatening emergency, suicidal or homicidal thoughts you must seek medical attention immediately by calling 911 or calling your MD immediately  if symptoms less severe.  You Must read complete instructions/literature along with all the possible adverse reactions/side effects for all the Medicines you take and that have been prescribed to you. Take any new Medicines after you have completely understood and accpet all the possible adverse reactions/side effects.   Please note  You were cared for by a hospitalist during your hospital stay. If you have any questions about  your discharge medications or the care you received while you were in the hospital after you are discharged, you can call the unit and asked to speak with the hospitalist on call if the hospitalist that took care of you is not available. Once you are discharged, your primary care physician will handle any further medical issues. Please note that NO REFILLS for any discharge medications will be authorized once you are discharged, as it is imperative that you return to your primary care physician (or establish a relationship with a primary care physician if you do not have one) for your aftercare needs so that they can reassess your need for medications and monitor your lab values.     Today   No acute events overnight.  Heart rates are stable.  Hemodynamically stable.  Respiratory status is stable.  Will discharge to his assisted living today.  VITAL SIGNS:  Blood pressure 137/88, pulse (!) 58, temperature  98.4 F (36.9 C), temperature source Oral, resp. rate 18, height 5\' 7"  (1.702 m), weight 82.1 kg, SpO2 95 %.  I/O:    Intake/Output Summary (Last 24 hours) at 07/22/2019 0929 Last data filed at 07/22/2019 0056 Gross per 24 hour  Intake 1000 ml  Output 0 ml  Net 1000 ml    PHYSICAL EXAMINATION:   GENERAL:  78 y.o.-year-old patient lying in bed leaning to the left in NAD.   EYES: Pupils equal, round, reactive to light and accommodation. No scleral icterus. Extraocular muscles intact.  HEENT: Head atraumatic, normocephalic. Oropharynx and nasopharynx clear.  NECK:  Supple, no jugular venous distention. No thyroid enlargement, no tenderness.  LUNGS: Good a/e b/l, no wheezing, rales, rhonchi. No use of accessory muscles of respiration.  CARDIOVASCULAR: S1, S2 normal. No murmurs, rubs, or gallops.  ABDOMEN: Soft, nontender, nondistended. Bowel sounds present. No organomegaly or mass.  EXTREMITIES: No cyanosis, clubbing or edema b/l.    NEUROLOGIC: Cranial nerves II through XII are intact. No focal Motor or sensory deficits b/l. Globally weak  PSYCHIATRIC: The patient is alert and oriented x 2.  SKIN: No obvious rash, lesion, or ulcer.   DATA REVIEW:   CBC Recent Labs  Lab 07/20/19 0522  WBC 8.7  HGB 12.8*  HCT 41.5  PLT 253    Chemistries  Recent Labs  Lab 07/18/19 1633  07/22/19 0427  NA 139   < > 135  K 3.4*   < > 3.9  CL 106   < > 104  CO2 25   < > 25  GLUCOSE 96   < > 111*  BUN 11   < > 10  CREATININE 0.70   < > 0.63  CALCIUM 8.3*   < > 8.5*  AST 22  --   --   ALT 8  --   --   ALKPHOS 77  --   --   BILITOT 0.8  --   --    < > = values in this interval not displayed.    Cardiac Enzymes No results for input(s): TROPONINI in the last 168 hours.  Microbiology Results  Results for orders placed or performed during the hospital encounter of 07/18/19  SARS Coronavirus 2 [facility] order, Performed in [facility] hospital lab) Nasopharyngeal Nasopharyngeal Swab      Status: None   Collection Time: 07/18/19  7:21 PM   Specimen: Nasopharyngeal Swab  Result Value Ref Range Status   SARS Coronavirus 2 NEGATIVE NEGATIVE Final    Comment: (NOTE) If result is  NEGATIVE SARS-CoV-2 target nucleic acids are NOT DETECTED. The SARS-CoV-2 RNA is generally detectable in upper and lower  respiratory specimens during the acute phase of infection. The lowest  concentration of SARS-CoV-2 viral copies this assay can detect is 250  copies / mL. A negative result does not preclude SARS-CoV-2 infection  and should not be used as the sole basis for treatment or other  patient management decisions.  A negative result may occur with  improper specimen collection / handling, submission of specimen other  than nasopharyngeal swab, presence of viral mutation(s) within the  areas targeted by this assay, and inadequate number of viral copies  (<250 copies / mL). A negative result must be combined with clinical  observations, patient history, and epidemiological information. If result is POSITIVE SARS-CoV-2 target nucleic acids are DETECTED. The SARS-CoV-2 RNA is generally detectable in upper and lower  respiratory specimens dur ing the acute phase of infection.  Positive  results are indicative of active infection with SARS-CoV-2.  Clinical  correlation with patient history and other diagnostic information is  necessary to determine patient infection status.  Positive results do  not rule out bacterial infection or co-infection with other viruses. If result is PRESUMPTIVE POSTIVE SARS-CoV-2 nucleic acids MAY BE PRESENT.   A presumptive positive result was obtained on the submitted specimen  and confirmed on repeat testing.  While 2019 novel coronavirus  (SARS-CoV-2) nucleic acids may be present in the submitted sample  additional confirmatory testing may be necessary for epidemiological  and / or clinical management purposes  to differentiate between  SARS-CoV-2 and other  Sarbecovirus currently known to infect humans.  If clinically indicated additional testing with an alternate test  methodology [phone]) is advised. The SARS-CoV-2 RNA is generally  detectable in upper and lower respiratory sp ecimens during the acute  phase of infection. The expected result is Negative. Fact Sheet for Patients:  StrictlyIdeas.no Fact Sheet for Healthcare Providers: BankingDealers.co.za This test is not yet approved or cleared by the [place] FDA and has been authorized for detection and/or diagnosis of SARS-CoV-2 by FDA under an Emergency Use Authorization (EUA).  This EUA will remain in effect (meaning this test can be used) for the duration of the COVID-19 declaration under Section 564(b)(1) of the Act, 21 U.S.C. section 360bbb-3(b)(1), unless the authorization is terminated or revoked sooner. Performed at [facility], [address] [place], [place] [address]   MRSA PCR Screening     Status: None   Collection Time: 07/19/19  1:09 AM   Specimen: Nasal Mucosa; Nasopharyngeal  Result Value Ref Range Status   MRSA by PCR NEGATIVE NEGATIVE Final    Comment:        The GeneXpert MRSA Assay (FDA approved for NASAL specimens only), is one component of a comprehensive MRSA colonization surveillance program. It is not intended to diagnose MRSA infection nor to guide or monitor treatment for MRSA infections. Performed at [facility], [address] [place], [place] [address]     RADIOLOGY:  No results found.    Management plans discussed with the patient, family and they are in agreement.  CODE STATUS:     Code Status Orders  (From admission, onward)         Start     Ordered   07/18/19 2311  Do not attempt resuscitation (DNR)  Continuous    Question Answer Comment  In the event of cardiac or respiratory ARREST Do not call a "code blue"  In the event of cardiac or  respiratory ARREST Do not perform Intubation, CPR, defibrillation or ACLS   In the event of cardiac or respiratory ARREST Use medication by any route, position, wound care, and other measures to relive pain and suffering. May use oxygen, suction and manual treatment of airway obstruction as needed for comfort.      07/18/19 2310          TOTAL TIME TAKING CARE OF THIS PATIENT: 40 minutes.    Henreitta Leber M.D on 07/22/2019 at 9:29 AM  Between 7am to 6pm - Pager - [phone]  After 6pm go to [email or web address] - Proofreader  [facility]  Office  [phone]  CC: Primary care physician; Rusty Aus, MD

## 2019-07-22 NOTE — NC FL2 (Addendum)
[place] LEVEL OF CARE SCREENING TOOL     IDENTIFICATION  Patient Name: Ricardo Erickson Birthdate: Mar 22, 1941 Sex: male Admission Date (Current Location): 07/18/2019  [place] and [place] Number:  Engineering geologist and Address:  [facility], [address] [place], [place] [address]      Provider Number: [id number]  Attending Physician Name and Address:  Henreitta Leber, MD  Relative Name and Phone Number:  Barnabas, Henriques   [phone] or Daine Gravel Daughter [phone]    Current Level of Care: Hospital Recommended Level of Care: [place], Memory Care Prior Approval Number:    Date Approved/Denied:   PASRR Number:    Discharge Plan: Other (Comment)([facility])    Current Diagnoses:  Multi-Infarct Dementia with behavioral disturbance Patient Active Problem List   Diagnosis Date Noted  . Dementia ([place]) 07/19/2019  . CHF (congestive heart failure) ([place]) 07/18/2019  . Acute exacerbation of congestive heart failure ([place]) 04/12/2019  . Influenza A 02/15/2019  . Pneumonia 11/27/2018  . Pulmonary emboli ([place]) 11/18/2017  . Tobacco use disorder 07/24/2017  . Encephalopathy   . Palliative care encounter   . Goals of care, counseling/discussion   . Pulmonary embolus ([place])   . UTI (urinary tract infection) 07/19/2017  . PTSD (post-traumatic stress disorder) 06/28/2017  . Adjustment disorder with anxiety 06/28/2017  . Closed compression fracture of L5 lumbar vertebra   . Left low back pain   . Weakness   . A-fib ([place]) 06/24/2017  . Prediabetes 01/18/2017  . Prostate cancer ([place]) 01/18/2017  . Anxiety 01/18/2017  . Preventative health care 04/15/2016  . Restless leg syndrome 04/03/2016  . COPD (chronic obstructive pulmonary disease) ([place]) 04/03/2016  . BPH (benign prostatic hyperplasia) 04/03/2016  . Personal history of tobacco use, presenting hazards to health 08/06/2015    Orientation  RESPIRATION BLADDER Height & Weight     Self, Place, Time  Normal Incontinent Weight: 181 lb (82.1 kg) Height:  5\' 7"  (170.2 cm)  BEHAVIORAL SYMPTOMS/MOOD NEUROLOGICAL BOWEL NUTRITION STATUS      Continent Diet(2G sodium diet)  AMBULATORY STATUS COMMUNICATION OF NEEDS Skin   Supervision Verbally Normal                       Personal Care Assistance Level of Assistance  Bathing, Feeding, Dressing Bathing Assistance: Limited assistance Feeding assistance: Independent Dressing Assistance: Limited assistance     Functional Limitations Info  Sight, Hearing, Speech Sight Info: Adequate Hearing Info: Adequate Speech Info: Adequate    SPECIAL CARE FACTORS FREQUENCY  PT (By licensed PT) Blood Pressure Frequency: Daily Weights,  Keep records Home health Physical therapy   PT Frequency: Home Health PT minimum 2x a week              Contractures Contractures Info: Not present    Additional Factors Info  Code Status, Allergies, Psychotropic Code Status Info: DNR Allergies Info: Ativan Lorazepam Psychotropic Info: risperiDONE 0.25 MG tablet         Current Medications (07/22/2019):  This is the current hospital active medication list Current Facility-Administered Medications  Medication Dose Route Frequency Provider Last Rate Last Dose  . 0.9 %  sodium chloride infusion  250 mL Intravenous PRN Vaughan Basta, MD      . acetaminophen (TYLENOL) tablet 650 mg  650 mg Oral Q8H PRN Vaughan Basta, MD      . acetaminophen (TYLENOL) tablet 650 mg  650 mg Oral  Q4H PRN Vaughan Basta, MD      . albuterol (PROVENTIL) (2.5 MG/3ML) 0.083% nebulizer solution 2.5 mg  2.5 mg Inhalation TID Epifanio Lesches, MD   2.5 mg at 07/22/19 0820  . apixaban (ELIQUIS) tablet 5 mg  5 mg Oral BID Vaughan Basta, MD   5 mg at 07/22/19 0924  . docusate sodium (COLACE) capsule 100 mg  100 mg Oral BID Vaughan Basta, MD   100 mg at 07/22/19 0924  . feeding  supplement (ENSURE ENLIVE) (ENSURE ENLIVE) liquid 237 mL  237 mL Oral TID BM Epifanio Lesches, MD   237 mL at 07/21/19 2259  . finasteride (PROSCAR) tablet 5 mg  5 mg Oral Daily Vaughan Basta, MD   5 mg at 07/22/19 0924  . FLUoxetine (PROZAC) capsule 20 mg  20 mg Oral Daily Patrecia Pour, NP   20 mg at 07/22/19 4627  . lisinopril (ZESTRIL) tablet 5 mg  5 mg Oral Daily Vaughan Basta, MD   5 mg at 07/22/19 0924  . memantine (NAMENDA) tablet 5 mg  5 mg Oral BID Vaughan Basta, MD   5 mg at 07/22/19 0350  . mometasone-formoterol (DULERA) 200-5 MCG/ACT inhaler 2 puff  2 puff Inhalation BID Vaughan Basta, MD   2 puff at 07/22/19 0938  . multivitamin with minerals tablet 1 tablet  1 tablet Oral Daily Epifanio Lesches, MD   1 tablet at 07/22/19 0924  . ondansetron (ZOFRAN) injection 4 mg  4 mg Intravenous Q6H PRN Vaughan Basta, MD      . potassium chloride (K-DUR) CR tablet 10 mEq  10 mEq Oral Daily Vaughan Basta, MD   10 mEq at 07/22/19 0924  . pramipexole (MIRAPEX) tablet 1 mg  1 mg Oral QHS Vaughan Basta, MD   1 mg at 07/21/19 2258  . risperiDONE (RISPERDAL) tablet 0.25 mg  0.25 mg Oral QHS Henreitta Leber, MD   0.25 mg at 07/21/19 2258  . sodium chloride flush (NS) 0.9 % injection 3 mL  3 mL Intravenous Q12H Vaughan Basta, MD   3 mL at 07/20/19 2106  . sodium chloride flush (NS) 0.9 % injection 3 mL  3 mL Intravenous PRN Vaughan Basta, MD      . tamsulosin (FLOMAX) capsule 0.4 mg  0.4 mg Oral QPC supper Vaughan Basta, MD   0.4 mg at 07/21/19 1749  . tiotropium (SPIRIVA) inhalation capsule ([facility] use ONLY) 18 mcg  18 mcg Inhalation Daily Vaughan Basta, MD   18 mcg at 07/22/19 1829     Discharge Medications: STOP taking these medications   metoprolol tartrate 50 MG tablet Commonly known as: LOPRESSOR     TAKE these medications   acetaminophen 650 MG CR tablet Commonly known as:  TYLENOL Take 650 mg by mouth every 8 (eight) hours as needed for pain.   Advair Diskus 250-50 MCG/DOSE Aepb Generic drug: Fluticasone-Salmeterol Inhale 1 puff into the lungs every 12 (twelve) hours.   albuterol 108 (90 Base) MCG/ACT inhaler Commonly known as: VENTOLIN HFA Inhale 2 puffs into the lungs every 4 (four) hours as needed for wheezing or shortness of breath.   albuterol (2.5 MG/3ML) 0.083% nebulizer solution Commonly known as: PROVENTIL Inhale 2.5 mg into the lungs 4 (four) times daily.   amiodarone 200 MG tablet Commonly known as: PACERONE Two tabs po daily for three more days then take one tablet daily afterwards What changed:   how much to take  how to take this  when to take this  additional instructions   apixaban 5 MG Tabs tablet Commonly known as: ELIQUIS Take 1 tablet (5 mg total) by mouth 2 (two) times daily.   docusate sodium 100 MG capsule Commonly known as: Colace Take 1 capsule (100 mg total) by mouth 2 (two) times daily.   finasteride 5 MG tablet Commonly known as: Proscar Take 1 tablet (5 mg total) by mouth daily.   FLUoxetine 10 MG capsule Commonly known as: PROZAC Take 10 mg by mouth daily.   furosemide 20 MG tablet Commonly known as: LASIX Take 20 mg by mouth every morning.   lidocaine 5 % Commonly known as: LIDODERM Place 1 patch onto the skin daily. Remove & Discard patch within 12 hours or as directed by MD   lisinopril 5 MG tablet Commonly known as: ZESTRIL Take 1 tablet (5 mg total) by mouth daily. Start taking on: July 23, 2019   memantine 5 MG tablet Commonly known as: NAMENDA Take 1 tablet (5 mg total) by mouth 2 (two) times daily.   potassium chloride 10 MEQ tablet Commonly known as: K-DUR Take 10 mEq by mouth daily.   pramipexole 1 MG tablet Commonly known as: MIRAPEX Take 1 tablet (1 mg total) by mouth at bedtime.   risperiDONE 0.25 MG tablet Commonly known as: RISPERDAL Take 1 tablet (0.25  mg total) by mouth at bedtime.   Spiriva HandiHaler 18 MCG inhalation capsule Generic drug: tiotropium Place 18 mcg into inhaler and inhale daily.   tamsulosin 0.4 MG Caps capsule Commonly known as: FLOMAX Take 2 capsules (0.8 mg total) by mouth daily after supper. What changed: how much to take     Relevant Imaging Results:  Relevant Lab Results:   Additional Information SSN [id number]  Ross Ludwig, LCSW

## 2019-07-26 ENCOUNTER — Ambulatory Visit: Payer: Medicare Other | Admitting: Family

## 2019-09-13 IMAGING — CT CT HEAD WITHOUT CONTRAST
4 of 5 series · 14 of 47 positions shown, 16 images · non-contrast
Comparison: Head CT April 12, 2019

CLINICAL DATA: Leaning to the left, concern for CVA

EXAM:
CT HEAD WITHOUT CONTRAST
TECHNIQUE: Contiguous axial images were obtained from the base of the skull
through the vertex without intravenous contrast.

[Series 2: head wo · axial · 0.47mm/px · z∈[-153,-33]mm · 5 of 36 slices shown, 7 images]
[im 6/36  brain]
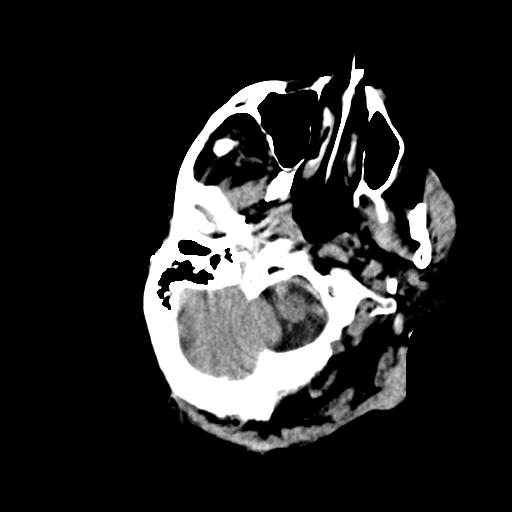
[im 6/36  bone]
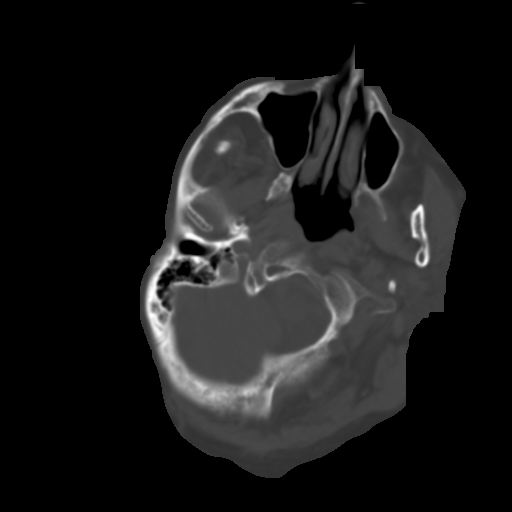
[im 12/36  brain]
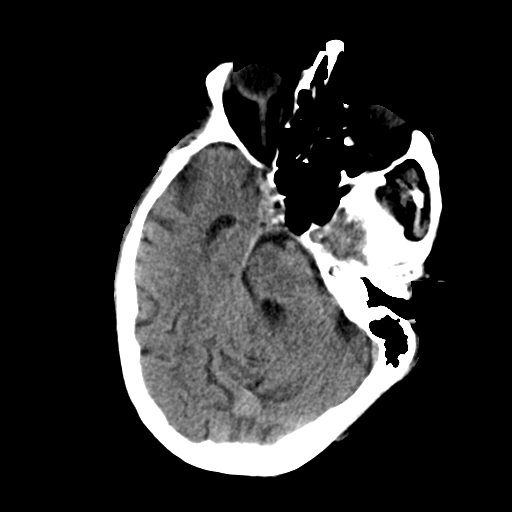
[im 18/36  brain]
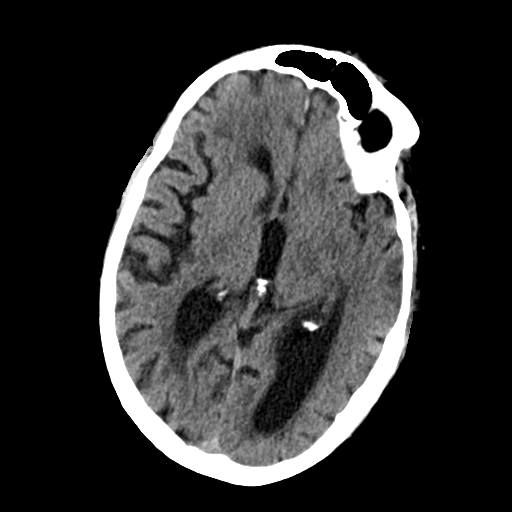
[im 24/36  brain]
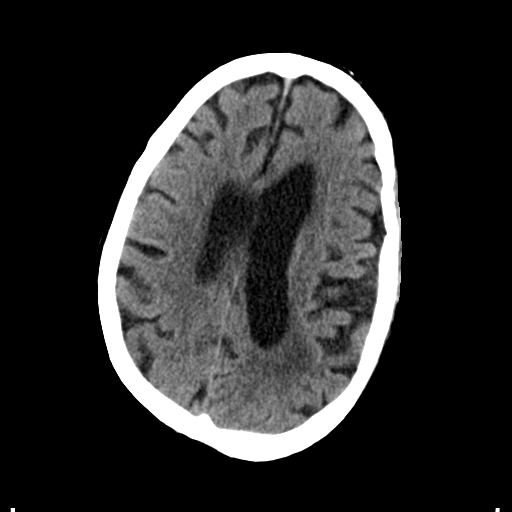
[im 30/36  brain]
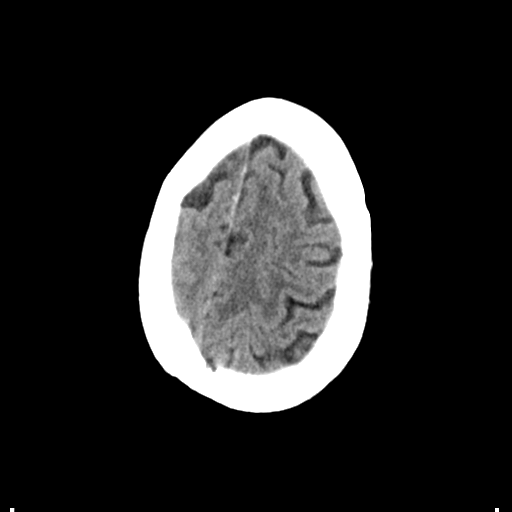
[im 30/36  bone]
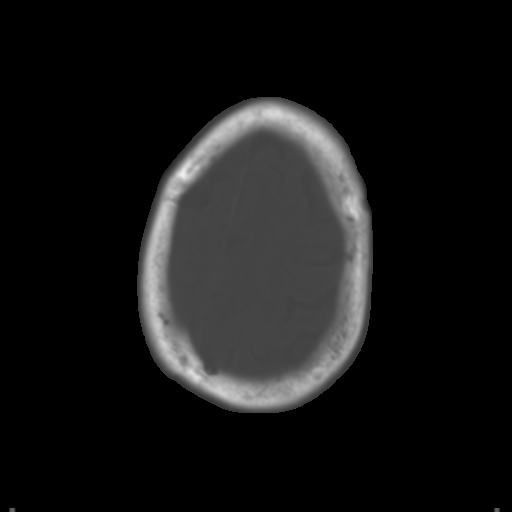

[Series 4: head wo recon · axial · 0.33mm/px · z∈[-126,-73]mm · 3 of 38 slices shown]
[im 7/38  brain]
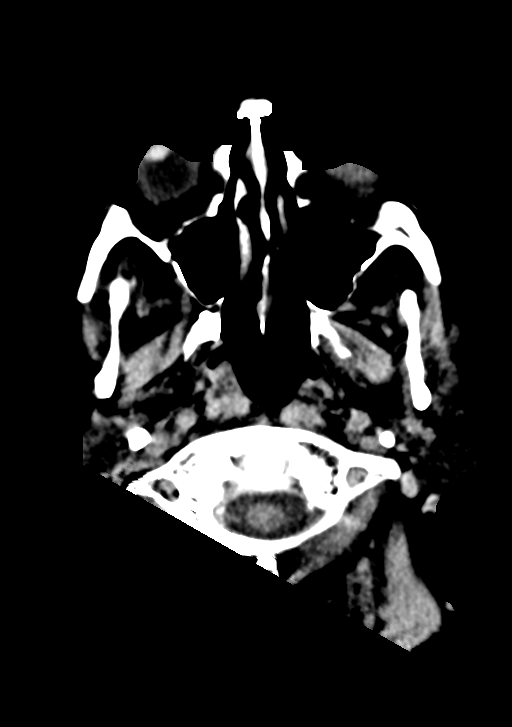
[im 13/38  brain]
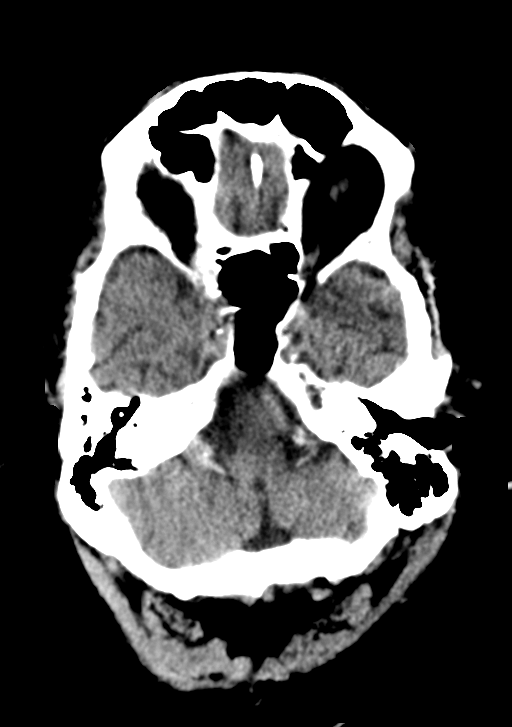
[im 19/38  brain]
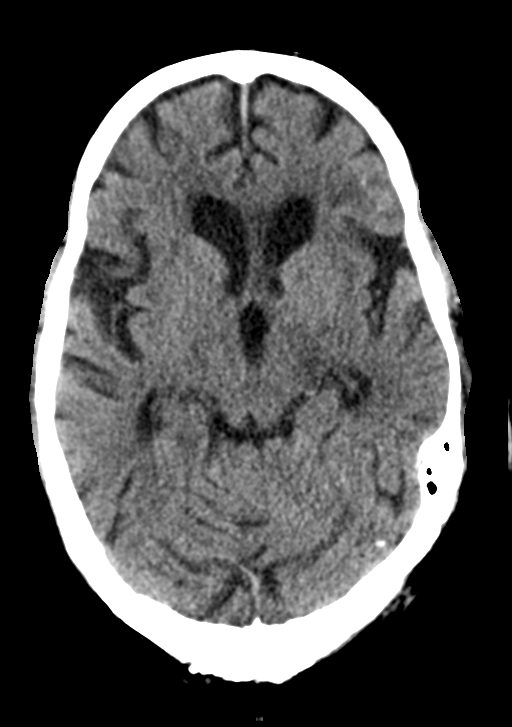

[Series 6: coronal soft tissue · coronal · 0.33mm/px · 3 of 81 slices shown]
[im 27/81  brain]
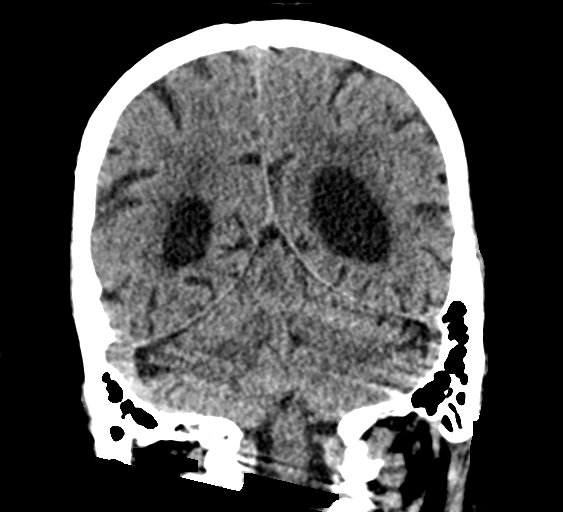
[im 36/81  brain]
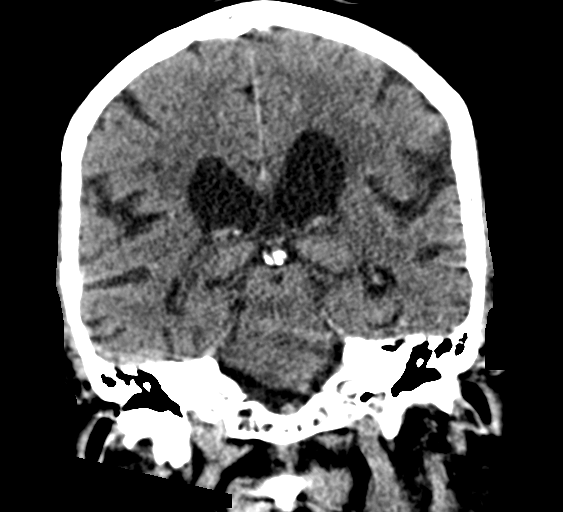
[im 45/81  brain]
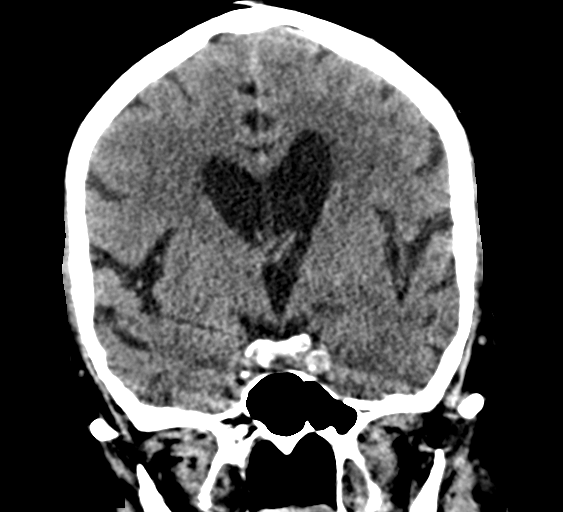

[Series 7: sagittal soft tissue · sagittal · 0.33mm/px · 3 of 63 slices shown]
[im 27/63  brain]
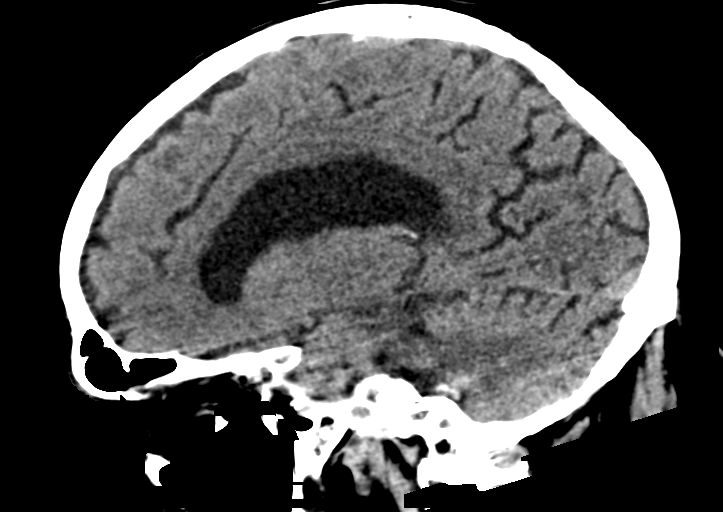
[im 32/63  brain]
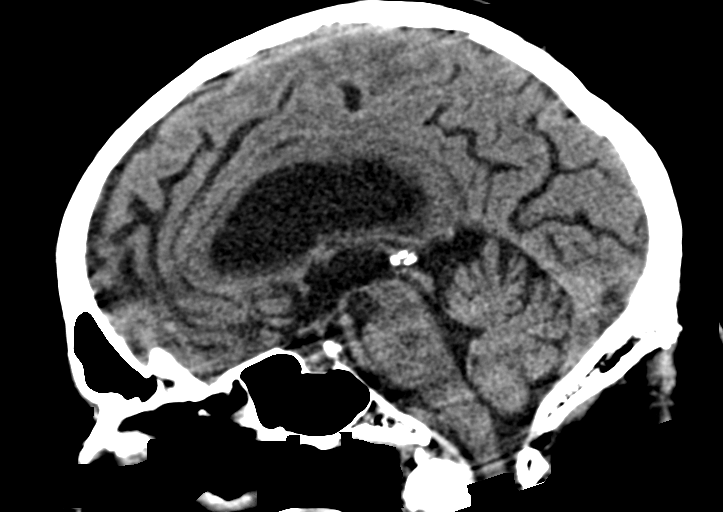
[im 36/63  brain]
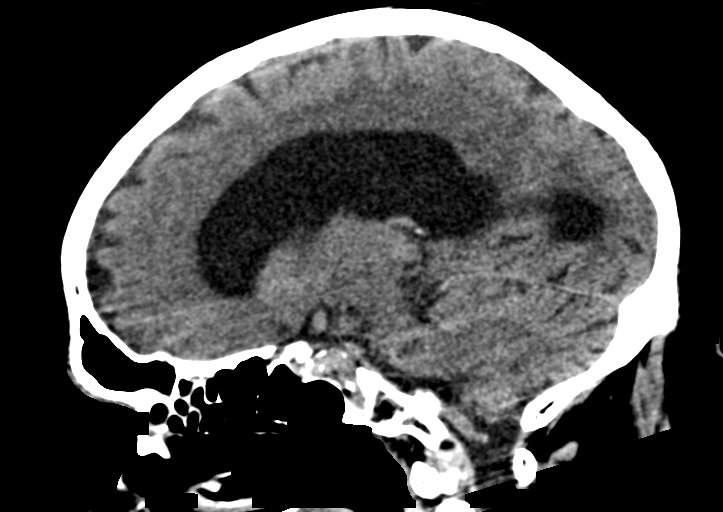

[14 of 47 positions shown; findings below may reference images not displayed]

FINDINGS: Brain: No evidence of acute infarction, hemorrhage, hydrocephalus,
extra-axial collection or mass lesion/mass effect. Symmetric
prominence of the ventricles, cisterns and sulci compatible with
parenchymal volume loss. Overall extent is similar to prior. Patchy
areas of white matter hypoattenuation are most compatible with
chronic microvascular angiopathy.

Vascular: Atherosclerotic calcification of the carotid siphons.

Skull: No calvarial fracture or suspicious osseous lesion. No scalp
swelling or hematoma.

Sinuses/Orbits: Paranasal sinuses and mastoid air cells are
predominantly clear. Orbital structures are unremarkable.

Other: None.
IMPRESSION: No acute intracranial abnormality. Stable parenchymal volume loss
and chronic microvascular angiopathy.

## 2019-11-14 ENCOUNTER — Other Ambulatory Visit: Payer: Self-pay

## 2019-11-14 ENCOUNTER — Emergency Department (HOSPITAL_COMMUNITY)
Admission: EM | Admit: 2019-11-14 | Discharge: 2019-11-14 | Disposition: A | Discharge status: Skilled Nursing Facility | Payer: Medicare Other | Source: Home / Self Care | Attending: Emergency Medicine | Admitting: Emergency Medicine

## 2019-11-14 ENCOUNTER — Emergency Department (HOSPITAL_COMMUNITY): Payer: Medicare Other

## 2019-11-14 DIAGNOSIS — R0902 Hypoxemia: Secondary | ICD-10-CM | POA: Diagnosis not present

## 2019-11-14 DIAGNOSIS — A411 Sepsis due to other specified staphylococcus: Secondary | ICD-10-CM | POA: Diagnosis not present

## 2019-11-14 DIAGNOSIS — Z79899 Other long term (current) drug therapy: Secondary | ICD-10-CM | POA: Insufficient documentation

## 2019-11-14 DIAGNOSIS — U071 COVID-19: Secondary | ICD-10-CM

## 2019-11-14 DIAGNOSIS — I509 Heart failure, unspecified: Secondary | ICD-10-CM | POA: Insufficient documentation

## 2019-11-14 DIAGNOSIS — Z7901 Long term (current) use of anticoagulants: Secondary | ICD-10-CM | POA: Insufficient documentation

## 2019-11-14 DIAGNOSIS — Z8546 Personal history of malignant neoplasm of prostate: Secondary | ICD-10-CM | POA: Insufficient documentation

## 2019-11-14 DIAGNOSIS — J449 Chronic obstructive pulmonary disease, unspecified: Secondary | ICD-10-CM | POA: Insufficient documentation

## 2019-11-14 DIAGNOSIS — F0390 Unspecified dementia without behavioral disturbance: Secondary | ICD-10-CM | POA: Insufficient documentation

## 2019-11-14 DIAGNOSIS — I110 Hypertensive heart disease with heart failure: Secondary | ICD-10-CM | POA: Insufficient documentation

## 2019-11-14 DIAGNOSIS — F17210 Nicotine dependence, cigarettes, uncomplicated: Secondary | ICD-10-CM | POA: Insufficient documentation

## 2019-11-14 LAB — BASIC METABOLIC PANEL
Anion gap: 8 (ref 5–15)
BUN: 15 mg/dL (ref 8–23)
CO2: 27 mmol/L (ref 22–32)
Calcium: 7.9 mg/dL — ABNORMAL LOW (ref 8.9–10.3)
Chloride: 107 mmol/L (ref 98–111)
Creatinine, Ser: 0.99 mg/dL (ref 0.61–1.24)
GFR calc Af Amer: >60 mL/min (ref >60)
GFR calc non Af Amer: >60 mL/min (ref >60)
Glucose, Bld: 86 mg/dL (ref 70–99)
Potassium: 3.7 mmol/L (ref 3.5–5.1)
Sodium: 142 mmol/L (ref 135–145)

## 2019-11-14 LAB — BLOOD GAS, VENOUS
Acid-Base Excess: 1.5 mmol/L (ref 0.0–2.0)
Bicarbonate: 29.5 mmol/L — ABNORMAL HIGH (ref 20.0–28.0)
O2 Saturation: 36.2 %
Patient temperature: 98.6
pCO2, Ven: 64.2 mmHg — ABNORMAL HIGH (ref 44.0–60.0)
pH, Ven: 7.284 (ref 7.250–7.430)

## 2019-11-14 LAB — CBC WITH DIFFERENTIAL/PLATELET
Abs Immature Granulocytes: 0.03 10*3/uL (ref 0.00–0.07)
Basophils Absolute: 0 10*3/uL (ref 0.0–0.1)
Basophils Relative: 0 %
Eosinophils Absolute: 0 10*3/uL (ref 0.0–0.5)
Eosinophils Relative: 0 %
HCT: 35.9 % — ABNORMAL LOW (ref 39.0–52.0)
Hemoglobin: 10.6 g/dL — ABNORMAL LOW (ref 13.0–17.0)
Immature Granulocytes: 0 %
Lymphocytes Relative: 12 %
Lymphs Abs: 0.8 10*3/uL (ref 0.7–4.0)
MCH: 27.7 pg (ref 26.0–34.0)
MCHC: 29.5 g/dL — ABNORMAL LOW (ref 30.0–36.0)
MCV: 93.7 fL (ref 80.0–100.0)
Monocytes Absolute: 1.7 10*3/uL — ABNORMAL HIGH (ref 0.1–1.0)
Monocytes Relative: 24 %
Neutro Abs: 4.3 10*3/uL (ref 1.7–7.7)
Neutrophils Relative %: 64 %
Platelets: 221 10*3/uL (ref 150–400)
RBC: 3.83 MIL/uL — ABNORMAL LOW (ref 4.22–5.81)
RDW: 14.5 % (ref 11.5–15.5)
WBC: 6.9 10*3/uL (ref 4.0–10.5)
nRBC: 0 % (ref 0.0–0.2)

## 2019-11-14 LAB — POC SARS CORONAVIRUS 2 AG -  ED: SARS Coronavirus 2 Ag: POSITIVE — AB

## 2019-11-14 NOTE — ED Notes (Signed)
Called facility and I was still unable to get in contact with anyone for pt transfer back. I called social work to assist me in pts return back to facility. Will continue to monitor.

## 2019-11-14 NOTE — ED Provider Notes (Signed)
[facility] DEPT Provider Note   CSN: [id number] Arrival date & time: 11/14/19  1452     History   Chief Complaint Chief Complaint  Patient presents with   covid +   Emesis   Fever    HPI Ricardo Erickson is a 78 y.o. male.     HPI   He presents for evaluation of febrile illness with positive Covid test today.  He does not use oxygen was found to have an oxygen saturation of 83%.  He is also had nausea and vomiting, today.  Level 5 caveat-dementia  Past Medical History:  Diagnosis Date   CHF (congestive heart failure) (HCC)    Chronic back pain    Dementia (HCC)    Depression    Emphysema lung ([place])    Hypertension    Personal history of tobacco use, presenting hazards to health 08/06/2015   Pollen allergies    PTSD (post-traumatic stress disorder)    Pulmonary embolism [facility])     Patient Active Problem List   Diagnosis Date Noted   Dementia ([place]) 07/19/2019   CHF (congestive heart failure) ([place]) 07/18/2019   Acute exacerbation of congestive heart failure ([place]) 04/12/2019   Influenza A 02/15/2019   Pneumonia 11/27/2018   Pulmonary emboli ([place]) 11/18/2017   Tobacco use disorder 07/24/2017   Encephalopathy    Palliative care encounter    Goals of care, counseling/discussion    Pulmonary embolus ([place])    UTI (urinary tract infection) 07/19/2017   PTSD (post-traumatic stress disorder) 06/28/2017   Adjustment disorder with anxiety 06/28/2017   Closed compression fracture of L5 lumbar vertebra    Left low back pain    Weakness    A-fib ([place]) 06/24/2017   Prediabetes 01/18/2017   Prostate cancer ([place]) 01/18/2017   Anxiety 01/18/2017   Preventative health care 04/15/2016   Restless leg syndrome 04/03/2016   COPD (chronic obstructive pulmonary disease) ([place]) 04/03/2016   BPH (benign prostatic hyperplasia) 04/03/2016   Personal history of tobacco use, presenting hazards to health 08/06/2015     Past Surgical History:  Procedure Laterality Date   BACK SURGERY     KYPHOPLASTY N/A 06/27/2017   Procedure: KYPHOPLASTY -L5;  Surgeon: Hessie Knows, MD;  Location: [facility] ORS;  Service: Orthopedics;  Laterality: N/A;   TONSILLECTOMY AND ADENOIDECTOMY  1980        Home Medications    Prior to Admission medications   Medication Sig Start Date End Date Taking? Authorizing Provider  acetaminophen (TYLENOL) 650 MG CR tablet Take 650 mg by mouth every 8 (eight) hours as needed for pain.    [provider]  ADVAIR DISKUS 250-50 MCG/DOSE AEPB Inhale 1 puff into the lungs every 12 (twelve) hours. 04/09/18   [provider]  albuterol (PROVENTIL HFA;VENTOLIN HFA) 108 (90 Base) MCG/ACT inhaler Inhale 2 puffs into the lungs every 4 (four) hours as needed for wheezing or shortness of breath.     [provider]  albuterol (PROVENTIL) (2.5 MG/3ML) 0.083% nebulizer solution Inhale 2.5 mg into the lungs 4 (four) times daily.  01/15/15   [provider]  amiodarone (PACERONE) 200 MG tablet Two tabs po daily for three more days then take one tablet daily afterwards Patient taking differently: Take 200 mg by mouth daily.  02/23/19   Loletha Grayer, MD  apixaban (ELIQUIS) 5 MG TABS tablet Take 1 tablet (5 mg total) by mouth 2 (two) times daily. 11/28/17   Gladstone Lighter, MD  docusate sodium (  COLACE) 100 MG capsule Take 1 capsule (100 mg total) by mouth 2 (two) times daily. 02/23/19 02/23/20  Loletha Grayer, MD  finasteride (PROSCAR) 5 MG tablet Take 1 tablet (5 mg total) by mouth daily. 01/21/19   Festus Aloe, MD  FLUoxetine (PROZAC) 10 MG capsule Take 10 mg by mouth daily.    [provider]  furosemide (LASIX) 20 MG tablet Take 20 mg by mouth every morning.  06/29/18   [provider]  lidocaine (LIDODERM) 5 % Place 1 patch onto the skin daily. Remove & Discard patch within 12 hours or as directed by MD 11/28/18   Gladstone Lighter, MD   lisinopril (ZESTRIL) 5 MG tablet Take 1 tablet (5 mg total) by mouth daily. 07/23/19 08/22/19  Henreitta Leber, MD  memantine (NAMENDA) 5 MG tablet Take 1 tablet (5 mg total) by mouth 2 (two) times daily. 02/23/19   Loletha Grayer, MD  potassium chloride (K-DUR) 10 MEQ tablet Take 10 mEq by mouth daily.    [provider]  pramipexole (MIRAPEX) 1 MG tablet Take 1 tablet (1 mg total) by mouth at bedtime. 11/22/17   Gladstone Lighter, MD  risperiDONE (RISPERDAL) 0.25 MG tablet Take 1 tablet (0.25 mg total) by mouth at bedtime. 07/22/19 08/21/19  Henreitta Leber, MD  SPIRIVA HANDIHALER 18 MCG inhalation capsule Place 18 mcg into inhaler and inhale daily. 06/11/18   [provider]  tamsulosin (FLOMAX) 0.4 MG CAPS capsule Take 2 capsules (0.8 mg total) by mouth daily after supper. Patient taking differently: Take 0.4 mg by mouth daily after supper.  02/02/18   Festus Aloe, MD    Family History Family History  Problem Relation Age of Onset   Alcohol abuse Father    Arthritis Mother    Heart disease Maternal Grandmother    Prostate cancer Brother     Social History Social History   Tobacco Use   Smoking status: Current Every Day Smoker    Packs/day: 0.50    Years: 60.00    Pack years: 30.00    Types: Cigarettes   Smokeless tobacco: Never Used  Substance Use Topics   Alcohol use: No    Alcohol/week: 0.0 standard drinks   Drug use: No     Allergies   Ativan [lorazepam]   Review of Systems Review of Systems  Unable to perform ROS: Dementia     Physical Exam Updated Vital Signs BP 107/63 (BP Location: Right Arm)    Pulse 73    Temp 98.8 F (37.1 C) (Oral) Comment: pt unable to fully close mouth around probe will need rectal   Resp (!) 24    SpO2 97%   Physical Exam Vitals signs and nursing note reviewed.  Constitutional:      General: He is not in acute distress.    Appearance: He is well-developed. He is ill-appearing. He is not  toxic-appearing or diaphoretic.     Comments: Elderly, debilitated  HENT:     Head: Normocephalic and atraumatic.     Right Ear: External ear normal.     Left Ear: External ear normal.  Eyes:     Conjunctiva/sclera: Conjunctivae normal.     Pupils: Pupils are equal, round, and reactive to light.  Neck:     Musculoskeletal: Normal range of motion and neck supple.     Trachea: Phonation normal.  Cardiovascular:     Rate and Rhythm: Normal rate and regular rhythm.  Pulmonary:     Effort: Pulmonary effort is  normal. No respiratory distress.     Breath sounds: No stridor.  Chest:     Chest wall: No tenderness.  Abdominal:     General: There is no distension.     Palpations: Abdomen is soft.     Tenderness: There is no abdominal tenderness.  Musculoskeletal:        General: No swelling or tenderness.  Skin:    General: Skin is warm and dry.  Neurological:     Mental Status: He is alert.     Cranial Nerves: No cranial nerve deficit.     Motor: No abnormal muscle tone.     Coordination: Coordination normal.  Psychiatric:        Mood and Affect: Mood normal.        Behavior: Behavior normal.      ED Treatments / Results  Labs (all labs ordered are listed, but only abnormal results are displayed) Labs Reviewed  BASIC METABOLIC PANEL - Abnormal; Notable for the following components:      Result Value   Calcium 7.9 (*)    All other components within normal limits  CBC WITH DIFFERENTIAL/PLATELET - Abnormal; Notable for the following components:   RBC 3.83 (*)    Hemoglobin 10.6 (*)    HCT 35.9 (*)    MCHC 29.5 (*)    Monocytes Absolute 1.7 (*)    All other components within normal limits  BLOOD GAS, VENOUS - Abnormal; Notable for the following components:   pCO2, Ven 64.2 (*)    Bicarbonate 29.5 (*)    All other components within normal limits  POC SARS CORONAVIRUS 2 AG -  ED - Abnormal; Notable for the following components:   SARS Coronavirus 2 Ag POSITIVE (*)    All  other components within normal limits    EKG None  Radiology Dg Chest Port 1 View  Result Date: 11/14/2019 CLINICAL DATA:  Short of breath.  COVID-19 positive. EXAM: PORTABLE CHEST 1 VIEW COMPARISON:  07/18/2019, 04/12/2019 FINDINGS: Bibasilar airspace disease similar to the recent study. Significant progression since 04/12/2019. Elevated left hemidiaphragm. Small left effusion. Pulmonary vascular congestion again noted. Chronic left rib fractures again noted. IMPRESSION: Extensive bibasilar airspace disease left greater than right with vascular congestion. This could represent pneumonia or heart failure. Electronically Signed   By: Franchot Gallo M.D.   On: 11/14/2019 16:28    Procedures .Critical Care Performed by: Daleen Bo, MD Authorized by: Daleen Bo, MD   Critical care provider statement:    Critical care time (minutes):  35   Critical care start time:  11/14/2019 3:30 PM   Critical care end time:  11/14/2019 10:45 PM   Critical care time was exclusive of:  Separately billable procedures and treating other patients   Critical care was necessary to treat or prevent imminent or life-threatening deterioration of the following conditions:  Respiratory failure   Critical care was time spent personally by me on the following activities:  Blood draw for specimens, development of treatment plan with patient or surrogate, discussions with consultants, evaluation of patient's response to treatment, examination of patient, obtaining history from patient or surrogate, ordering and performing treatments and interventions, ordering and review of laboratory studies, pulse oximetry, re-evaluation of patient's condition, review of old charts and ordering and review of radiographic studies   (including critical care time)  Medications Ordered in ED Medications - No data to display   Initial Impression / Assessment and Plan / ED Course  I have reviewed  the triage vital signs and the  nursing notes.  Pertinent labs & imaging results that were available during my care of the patient were reviewed by me and considered in my medical decision making (see chart for details).  Clinical Course as of Nov 14 1212  Thu Nov 14, 2019  1727 Normal except hemoglobin low  CBC with Differential(!) [EW]  1727 Bibasilar infiltrates, left greater than right, interpreted by me  [facility] [EW]  [phone] I was able to reach his son [name], on the phone.  He is the patient's power of attorney.  He agrees with treating hypoxia with nasal cannula oxygen.  He was worried about nausea vomiting, patient not vomited here.  The patient has a MOST form, describing his care plan, which includes no IV fluids/medications and DNR.  This limits potential treatments, and allows for ongoing management at his facility, using only nasal cannula oxygen in addition to his usual medications.   [EW]  1743 Normal except calcium low  Basic metabolic panel(!) [EW]  [id number] Normal except PCO2 elevated, and bicarbonate elevated on venous gas.  Blood gas, venous(!) [EW]    Clinical Course User Index [EW] Daleen Bo, MD        Patient Vitals for the past 24 hrs:  BP Temp Temp src Pulse Resp SpO2  11/14/19 2232 107/63 -- -- 73 (!) 24 97 %  11/14/19 2100 (!) 117/53 -- -- 71 20 100 %  11/14/19 2000 120/60 -- -- 70 (!) 21 100 %  11/14/19 1847 (!) 141/73 -- -- 79 (!) 26 98 %  11/14/19 1630 112/74 -- -- 61 20 98 %  11/14/19 1600 (!) 127/51 -- -- 60 (!) 21 100 %  11/14/19 1550 120/76 -- -- 66 20 100 %  11/14/19 1539 120/76 98.8 F (37.1 C) Oral 73 (!) 31 95 %  11/14/19 1538 -- -- -- -- -- 95 %  11/14/19 1517 -- -- -- -- -- 97 %    At discharge- reevaluation with update and discussion. After initial assessment and treatment, an updated evaluation reveals he continues to have normal saturations with supplementation oxygen by nasal cannula.  Her oxygen for comfort care. Daleen Bo   Medical Decision Making:  Respiratory distress with hypoxia, secondary to COVID-19 infection.  Patient is comfort care status.  No indication for further intervention or hospitalization at this time.  Patient's power of attorney has been contacted regarding the care plan and is agreeable.  This is within the constraints of the previously determined care plan.  Ricardo Erickson was evaluated in Emergency Department on 11/15/2019 for the symptoms described in the history of present illness. He was evaluated in the context of the global COVID-19 pandemic, which necessitated consideration that the patient might be at risk for infection with the SARS-CoV-2 virus that causes COVID-19. Institutional protocols and algorithms that pertain to the evaluation of patients at risk for COVID-19 are in a state of rapid change based on information released by regulatory bodies including the CDC and federal and state organizations. These policies and algorithms were followed during the patient's care in the ED.  CRITICAL CARE- yes Performed by: Daleen Bo   Nursing Notes Reviewed/ Care Coordinated Applicable Imaging Reviewed Interpretation of Laboratory Data incorporated into ED treatment  The patient appears reasonably screened and/or stabilized for discharge and I doubt any other medical condition or other [facility] requiring further screening, evaluation, or treatment in the ED at this time prior to discharge.  Plan: Home Medications-continue current treatments with addition of oxygen by nasal cannula.; Home Treatments-encourage fluids; return here if the recommended treatment, does not improve the symptoms; Recommended follow up-PCP as needed.     Final Clinical Impressions(s) / ED Diagnoses   Final diagnoses:  [place]  Hypoxia    ED Discharge Orders    None       Daleen Bo, MD 11/15/19 1218

## 2019-11-14 NOTE — ED Notes (Signed)
Placed call to [facility] place and asked to speak to receiving nurse for patient. I was then transferred to Director of Nursing but no one picked up, left voicemail to please call back ED for patient update and return back to facility. Will try back in a few

## 2019-11-14 NOTE — ED Notes (Signed)
Social worker called facility and was told someone would call nurse in ED. ED nurse never received call from facility. Notified social work who has now made call the PD. Will continue to monitor situation.

## 2019-11-14 NOTE — Progress Notes (Addendum)
CSW received a call from pt's RN stating RN is unable to get anyone to answer at [facility] as pt is ready to D/C back to the facility.  CSW called Linus Orn in admissions at ph: [phone] who states she would call the facility, find a working number and would call the RN via the ED secretary to really that working number.  CSW will continue to follow for D/C needs.  Ricardo Erickson. Ricardo Ballentine, LCSW, LCAS, CSI Transitions of Care Clinical Social Worker Care Coordination Department Ph: [phone]

## 2019-11-14 NOTE — ED Notes (Signed)
Patient in with portable  X-ray

## 2019-11-14 NOTE — ED Notes (Signed)
Called facility and spoke to nurse Aniceto Boss and informed her pt would be discharged on oxygen requirements that would need to be met. Nurse notified me that patient would not be going back to [facility] but instead to [facility].

## 2019-11-14 NOTE — ED Notes (Signed)
Called facility for the fourth time and still no answer. Called pts POA for assistance in contacting facility nurse but he was unable to provide information. Will try facililty again to confirm pt transfer back and need for O2 maintenance

## 2019-11-14 NOTE — ED Notes (Signed)
PT has history of CVA, speech is unclear, and pt has difficulty fully closing his mouth. Pt has notable drooling under mask. Pt speaks 2-3 word sentences. PT has history of COPD but does not typically require oxygen at baseline. Pt has history of dementia and is AOx2

## 2019-11-14 NOTE — ED Triage Notes (Signed)
78 yo male BIB GEMS from [facility].  Facility called EMS due to patient fever, nausea/vomiting and hypoxia x97min. Facility states they did rapid COVID test today which came back positive. EMS states pt temp was 100.6, 83% on room air. PT has history of COPD but doesn't normally require O2. Pt has history of dementia but was able to answer all questions appropriately as per EMS  BP 96/40  Hr 64 sinus rhythm  cbg 141 rr24 Pt currently on 3L and satting at 97% pt refused PO tyelenol en route. 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 He is on permanent liquid diet as per EMS.  4 of zofran  18g left forearm

## 2019-11-14 NOTE — Discharge Instructions (Addendum)
Use oxygen at 2 L as needed for oxygen saturations less than 92%.  Encourage him to drink water frequently and eat a regular diet.  Use Tylenol if needed for fever or pain.  Continue taking usual medications.

## 2019-11-14 NOTE — ED Notes (Signed)
[phone]) POA

## 2019-11-14 NOTE — ED Notes (Signed)
ED TO INPATIENT HANDOFF REPORT  ED Nurse Name and Phone #: Jeoffrey Massed Name/Age/Gender Ricardo Erickson 78 y.o. male Room/Bed: WA02/WA02  Code Status   Code Status: Prior  Home/SNF/Other Skilled nursing facility Patient oriented to: self and place Is this baseline? Yes   Triage Complete: Triage complete  Chief Complaint Covid+, emesis  Triage Note 78 yo male BIB GEMS from [facility].  Facility called EMS due to patient fever, nausea/vomiting and hypoxia x79mn. Facility states they did rapid COVID test today which came back positive. EMS states pt temp was 100.6, 83% on room air. PT has history of COPD but doesn't normally require O2. Pt has history of dementia but was able to answer all questions appropriately as per EMS  BP 96/40  Hr 64 sinus rhythm  cbg 141 rr24 Pt currently on 3L and satting at 97% pt refused PO tyelenol en route. 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 He is on permanent liquid diet as per EMS.  4 of zofran  18g left forearm    Allergies Allergies  Allergen Reactions  . Ativan [Lorazepam] Other (See Comments)    Patient gets severe altered mental status and confusion when taking this medicine    Level of Care/Admitting Diagnosis ED Disposition    ED Disposition Condition Comment   Discharge  The patient appears reasonably screened and/or stabilized for discharge and I doubt any other medical condition or other [facility], evaluation, or treatment in the ED exists or is present at this time prior to discharge.       B Medical/Surgery History Past Medical History:  Diagnosis Date  . CHF (congestive heart failure) ([facility]   . Chronic back pain   . Dementia ([facility]   . Depression   . Emphysema lung ([facility]   . Hypertension   . Personal history of tobacco use, presenting hazards to health 08/06/2015  . Pollen allergies   . PTSD (post-traumatic stress disorder)   . Pulmonary embolism ([facility]     Past Surgical History:  Procedure Laterality Date  . BACK SURGERY    . KYPHOPLASTY N/A 06/27/2017   Procedure: KYPHOPLASTY -L5;  Surgeon: MHessie Knows MD;  Location: [facility] ORS;  Service: Orthopedics;  Laterality: N/A;  . TONSILLECTOMY AND ADENOIDECTOMY  1980     A IV Location/Drains/Wounds Patient Lines/Drains/Airways Status   Active Line/Drains/Airways    Name:   Placement date:   Placement time:   Site:   Days:   Peripheral IV 11/14/19 Anterior;Left Forearm   11/14/19    1521    Forearm   less than 1   External Urinary Catheter   07/21/19    2322    -   116   Incision (Closed) 07/23/17 Back   07/23/17    1900     844          Intake/Output Last 24 hours No intake or output data in the 24 hours ending 11/14/19 2147  Labs/Imaging Results for orders placed or performed during the hospital encounter of 11/14/19 (from the past 48 hour(s))  Blood gas, venous     Status: Abnormal   Collection Time: 11/14/19  5:08 PM  Result Value Ref Range   pH, Ven 7.284 7.250 - 7.430   pCO2, Ven 64.2 (H) 44.0 - 60.0 mmHg   pO2, Ven BELOW REPORTABLE RANGE 32.0 - 45.0 mmHg   Bicarbonate 29.5 (H) 20.0 - 28.0 mmol/L   Acid-Base Excess 1.5  0.0 - 2.0 mmol/L   O2 Saturation 36.2 %   Patient temperature 98.6     Comment: Performed at [facility], [place] [address] [place], [place] [address]  Basic metabolic panel     Status: Abnormal   Collection Time: 11/14/19  5:09 PM  Result Value Ref Range   Sodium 142 135 - 145 mmol/L   Potassium 3.7 3.5 - 5.1 mmol/L   Chloride 107 98 - 111 mmol/L   CO2 27 22 - 32 mmol/L   Glucose, Bld 86 70 - 99 mg/dL   BUN 15 8 - 23 mg/dL   Creatinine, Ser 0.99 0.61 - 1.24 mg/dL   Calcium 7.9 (L) 8.9 - 10.3 mg/dL   GFR calc non Af Amer >60 >60 mL/min   GFR calc Af Amer >60 >60 mL/min   Anion gap 8 5 - 15    Comment: Performed at [facility], [place] [address] [place], [place] [address]  CBC with Differential     Status: Abnormal    Collection Time: 11/14/19  5:09 PM  Result Value Ref Range   WBC 6.9 4.0 - 10.5 K/uL   RBC 3.83 (L) 4.22 - 5.81 MIL/uL   Hemoglobin 10.6 (L) 13.0 - 17.0 g/dL   HCT 35.9 (L) 39.0 - 52.0 %   MCV 93.7 80.0 - 100.0 fL   MCH 27.7 26.0 - 34.0 pg   MCHC 29.5 (L) 30.0 - 36.0 g/dL   RDW 14.5 11.5 - 15.5 %   Platelets 221 150 - 400 K/uL   nRBC 0.0 0.0 - 0.2 %   Neutrophils Relative % 64 %   Neutro Abs 4.3 1.7 - 7.7 K/uL   Lymphocytes Relative 12 %   Lymphs Abs 0.8 0.7 - 4.0 K/uL   Monocytes Relative 24 %   Monocytes Absolute 1.7 (H) 0.1 - 1.0 K/uL   Eosinophils Relative 0 %   Eosinophils Absolute 0.0 0.0 - 0.5 K/uL   Basophils Relative 0 %   Basophils Absolute 0.0 0.0 - 0.1 K/uL   Immature Granulocytes 0 %   Abs Immature Granulocytes 0.03 0.00 - 0.07 K/uL    Comment: Performed at [facility], [place] [address], [place], [place] [address]  POC SARS Coronavirus 2 Ag-ED - Nasal Swab (BD Veritor Kit)     Status: Abnormal   Collection Time: 11/14/19  5:32 PM  Result Value Ref Range   SARS Coronavirus 2 Ag POSITIVE (A) NEGATIVE    Comment: (NOTE) SARS-CoV-2 antigen PRESENT. Positive results indicate the presence of viral antigens, but clinical correlation with patient history and other diagnostic information is necessary to determine patient infection status.  Positive results do not rule out bacterial infection or co-infection  with other viruses. False positive results are rare but can occur, and confirmatory RT-PCR testing may be appropriate in some circumstances. The expected result is Negative. Fact Sheet for Patients: PodPark.tn Fact Sheet for Providers: GiftContent.is  This test is not yet approved or cleared by the [place] FDA and  has been authorized for detection and/or diagnosis of SARS-CoV-2 by FDA under an Emergency Use Authorization (EUA).  This EUA will remain in effect (meaning this test can  be used) for the duration of  the COVID-19 declaration under Section 564(b)(1) of the Act, 21 U.S.C. section 360bbb-3(b)(1), unless the a uthorization is terminated or revoked sooner.    Dg Chest Port 1 View  Result Date: 11/14/2019 CLINICAL DATA:  Short of breath.  COVID-19  positive. EXAM: PORTABLE CHEST 1 VIEW COMPARISON:  07/18/2019, 04/12/2019 FINDINGS: Bibasilar airspace disease similar to the recent study. Significant progression since 04/12/2019. Elevated left hemidiaphragm. Small left effusion. Pulmonary vascular congestion again noted. Chronic left rib fractures again noted. IMPRESSION: Extensive bibasilar airspace disease left greater than right with vascular congestion. This could represent pneumonia or heart failure. Electronically Signed   By: Franchot Gallo M.D.   On: 11/14/2019 16:28    Pending Labs Unresulted Labs (From admission, onward)   None      Vitals/Pain Today's Vitals   11/14/19 1630 11/14/19 1847 11/14/19 2000 11/14/19 2100  BP: 112/74 (!) 141/73 120/60 (!) 117/53  Pulse: 61 79 70 71  Resp: 20 (!) 26 (!) 21 20  Temp:      TempSrc:      SpO2: 98% 98% 100% 100%    Isolation Precautions Airborne and Contact precautions  Medications Medications - No data to display  Mobility non-ambulatory High fall risk   Focused Assessments Pulmonary Assessment Handoff:  Lung sounds: Bilateral Breath Sounds: Diminished, Expiratory wheezes O2 Device: Room Air, Nasal Cannula O2 Flow Rate (L/min): 3 L/min      R Recommendations: See Admitting Provider Note  Report given to:   Additional Notes:

## 2019-11-14 NOTE — ED Notes (Signed)
[facility] called for patient transport back to facility.  

## 2019-11-17 ENCOUNTER — Inpatient Hospital Stay (HOSPITAL_COMMUNITY)
Admission: EM | Admit: 2019-11-17 | Discharge: 2019-12-13 | DRG: 871 | Disposition: E | Payer: Medicare Other | Attending: Family Medicine | Admitting: Family Medicine

## 2019-11-17 ENCOUNTER — Emergency Department (HOSPITAL_COMMUNITY): Payer: Medicare Other

## 2019-11-17 ENCOUNTER — Other Ambulatory Visit: Payer: Self-pay

## 2019-11-17 DIAGNOSIS — G2581 Restless legs syndrome: Secondary | ICD-10-CM | POA: Diagnosis present

## 2019-11-17 DIAGNOSIS — D72810 Lymphocytopenia: Secondary | ICD-10-CM | POA: Diagnosis present

## 2019-11-17 DIAGNOSIS — F0390 Unspecified dementia without behavioral disturbance: Secondary | ICD-10-CM | POA: Diagnosis present

## 2019-11-17 DIAGNOSIS — Z66 Do not resuscitate: Secondary | ICD-10-CM | POA: Diagnosis present

## 2019-11-17 DIAGNOSIS — E8809 Other disorders of plasma-protein metabolism, not elsewhere classified: Secondary | ICD-10-CM | POA: Diagnosis present

## 2019-11-17 DIAGNOSIS — R0602 Shortness of breath: Secondary | ICD-10-CM

## 2019-11-17 DIAGNOSIS — R310 Gross hematuria: Secondary | ICD-10-CM | POA: Diagnosis present

## 2019-11-17 DIAGNOSIS — I509 Heart failure, unspecified: Secondary | ICD-10-CM | POA: Diagnosis not present

## 2019-11-17 DIAGNOSIS — F17210 Nicotine dependence, cigarettes, uncomplicated: Secondary | ICD-10-CM | POA: Diagnosis present

## 2019-11-17 DIAGNOSIS — U071 COVID-19: Secondary | ICD-10-CM | POA: Diagnosis present

## 2019-11-17 DIAGNOSIS — Z79899 Other long term (current) drug therapy: Secondary | ICD-10-CM | POA: Diagnosis not present

## 2019-11-17 DIAGNOSIS — A411 Sepsis due to other specified staphylococcus: Principal | ICD-10-CM | POA: Diagnosis present

## 2019-11-17 DIAGNOSIS — Z9981 Dependence on supplemental oxygen: Secondary | ICD-10-CM

## 2019-11-17 DIAGNOSIS — I110 Hypertensive heart disease with heart failure: Secondary | ICD-10-CM | POA: Diagnosis present

## 2019-11-17 DIAGNOSIS — J1282 Pneumonia due to coronavirus disease 2019: Secondary | ICD-10-CM | POA: Diagnosis present

## 2019-11-17 DIAGNOSIS — I480 Paroxysmal atrial fibrillation: Secondary | ICD-10-CM | POA: Diagnosis present

## 2019-11-17 DIAGNOSIS — J1289 Other viral pneumonia: Secondary | ICD-10-CM | POA: Diagnosis present

## 2019-11-17 DIAGNOSIS — I5033 Acute on chronic diastolic (congestive) heart failure: Secondary | ICD-10-CM | POA: Diagnosis present

## 2019-11-17 DIAGNOSIS — F4310 Post-traumatic stress disorder, unspecified: Secondary | ICD-10-CM | POA: Diagnosis present

## 2019-11-17 DIAGNOSIS — Z7951 Long term (current) use of inhaled steroids: Secondary | ICD-10-CM | POA: Diagnosis not present

## 2019-11-17 DIAGNOSIS — J9601 Acute respiratory failure with hypoxia: Secondary | ICD-10-CM

## 2019-11-17 DIAGNOSIS — Z7901 Long term (current) use of anticoagulants: Secondary | ICD-10-CM

## 2019-11-17 DIAGNOSIS — I4891 Unspecified atrial fibrillation: Secondary | ICD-10-CM

## 2019-11-17 DIAGNOSIS — R6520 Severe sepsis without septic shock: Secondary | ICD-10-CM | POA: Diagnosis present

## 2019-11-17 DIAGNOSIS — E872 Acidosis: Secondary | ICD-10-CM | POA: Diagnosis present

## 2019-11-17 DIAGNOSIS — D62 Acute posthemorrhagic anemia: Secondary | ICD-10-CM | POA: Diagnosis present

## 2019-11-17 DIAGNOSIS — J440 Chronic obstructive pulmonary disease with acute lower respiratory infection: Secondary | ICD-10-CM | POA: Diagnosis present

## 2019-11-17 DIAGNOSIS — Z86711 Personal history of pulmonary embolism: Secondary | ICD-10-CM

## 2019-11-17 DIAGNOSIS — Z515 Encounter for palliative care: Secondary | ICD-10-CM | POA: Diagnosis present

## 2019-11-17 DIAGNOSIS — Z8546 Personal history of malignant neoplasm of prostate: Secondary | ICD-10-CM

## 2019-11-17 DIAGNOSIS — I2699 Other pulmonary embolism without acute cor pulmonale: Secondary | ICD-10-CM | POA: Diagnosis not present

## 2019-11-17 DIAGNOSIS — R0902 Hypoxemia: Secondary | ICD-10-CM

## 2019-11-17 DIAGNOSIS — N400 Enlarged prostate without lower urinary tract symptoms: Secondary | ICD-10-CM | POA: Diagnosis present

## 2019-11-17 DIAGNOSIS — J9621 Acute and chronic respiratory failure with hypoxia: Secondary | ICD-10-CM | POA: Diagnosis present

## 2019-11-17 LAB — POCT I-STAT 7, (LYTES, BLD GAS, ICA,H+H)
Acid-Base Excess: 4 mmol/L — ABNORMAL HIGH (ref 0.0–2.0)
Acid-Base Excess: 2 mmol/L (ref 0.0–2.0)
Bicarbonate: 28.6 mmol/L — ABNORMAL HIGH (ref 20.0–28.0)
Bicarbonate: 27.8 mmol/L (ref 20.0–28.0)
Calcium, Ion: 1.14 mmol/L — ABNORMAL LOW (ref 1.15–1.40)
Calcium, Ion: 1.17 mmol/L (ref 1.15–1.40)
HCT: 38 % — ABNORMAL LOW (ref 39.0–52.0)
HCT: 44 % (ref 39.0–52.0)
Hemoglobin: 12.9 g/dL — ABNORMAL LOW (ref 13.0–17.0)
Hemoglobin: 15 g/dL (ref 13.0–17.0)
O2 Saturation: 99 %
O2 Saturation: 94 %
Patient temperature: 103.3
Patient temperature: 98.5
Potassium: 3.4 mmol/L — ABNORMAL LOW (ref 3.5–5.1)
Potassium: 3.5 mmol/L (ref 3.5–5.1)
Sodium: 139 mmol/L (ref 135–145)
Sodium: 139 mmol/L (ref 135–145)
TCO2: 30 mmol/L (ref 22–32)
TCO2: 29 mmol/L (ref 22–32)
pCO2 arterial: 45.4 mmHg (ref 32.0–48.0)
pCO2 arterial: 45.4 mmHg (ref 32.0–48.0)
pH, Arterial: 7.417 (ref 7.350–7.450)
pH, Arterial: 7.395 (ref 7.350–7.450)
pO2, Arterial: 161 mmHg — ABNORMAL HIGH (ref 83.0–108.0)
pO2, Arterial: 71 mmHg — ABNORMAL LOW (ref 83.0–108.0)

## 2019-11-17 LAB — COMPREHENSIVE METABOLIC PANEL
ALT: 7 U/L (ref 0–44)
AST: 16 U/L (ref 15–41)
Albumin: 2.3 g/dL — ABNORMAL LOW (ref 3.5–5.0)
Alkaline Phosphatase: 91 U/L (ref 38–126)
Anion gap: 8 (ref 5–15)
BUN: 18 mg/dL (ref 8–23)
CO2: 24 mmol/L (ref 22–32)
Calcium: 8 mg/dL — ABNORMAL LOW (ref 8.9–10.3)
Chloride: 105 mmol/L (ref 98–111)
Creatinine, Ser: 1.02 mg/dL (ref 0.61–1.24)
GFR calc Af Amer: >60 mL/min (ref >60)
GFR calc non Af Amer: >60 mL/min (ref >60)
Glucose, Bld: 146 mg/dL — ABNORMAL HIGH (ref 70–99)
Potassium: 3.6 mmol/L (ref 3.5–5.1)
Sodium: 137 mmol/L (ref 135–145)
Total Bilirubin: 0.8 mg/dL (ref 0.3–1.2)
Total Protein: 5.5 g/dL — ABNORMAL LOW (ref 6.5–8.1)

## 2019-11-17 LAB — CBC WITH DIFFERENTIAL/PLATELET
Abs Immature Granulocytes: 0.05 10*3/uL (ref 0.00–0.07)
Basophils Absolute: 0 10*3/uL (ref 0.0–0.1)
Basophils Relative: 0 %
Eosinophils Absolute: 0 10*3/uL (ref 0.0–0.5)
Eosinophils Relative: 0 %
HCT: 42.1 % (ref 39.0–52.0)
Hemoglobin: 13.3 g/dL (ref 13.0–17.0)
Immature Granulocytes: 1 %
Lymphocytes Relative: 5 %
Lymphs Abs: 0.4 10*3/uL — ABNORMAL LOW (ref 0.7–4.0)
MCH: 28.2 pg (ref 26.0–34.0)
MCHC: 31.6 g/dL (ref 30.0–36.0)
MCV: 89.4 fL (ref 80.0–100.0)
Monocytes Absolute: 0.8 10*3/uL (ref 0.1–1.0)
Monocytes Relative: 9 %
Neutro Abs: 7.9 10*3/uL — ABNORMAL HIGH (ref 1.7–7.7)
Neutrophils Relative %: 85 %
Platelets: 215 10*3/uL (ref 150–400)
RBC: 4.71 MIL/uL (ref 4.22–5.81)
RDW: 14.3 % (ref 11.5–15.5)
WBC: 9.1 10*3/uL (ref 4.0–10.5)
nRBC: 0 % (ref 0.0–0.2)

## 2019-11-17 LAB — LACTATE DEHYDROGENASE: LDH: 177 U/L (ref 98–192)

## 2019-11-17 LAB — TROPONIN I (HIGH SENSITIVITY)
Troponin I (High Sensitivity): 12 ng/L (ref <18)
Troponin I (High Sensitivity): 13 ng/L (ref <18)

## 2019-11-17 LAB — POC SARS CORONAVIRUS 2 AG -  ED: SARS Coronavirus 2 Ag: POSITIVE — AB

## 2019-11-17 LAB — LACTIC ACID, PLASMA
Lactic Acid, Venous: 2 mmol/L (ref 0.5–1.9)
Lactic Acid, Venous: 1.5 mmol/L (ref 0.5–1.9)

## 2019-11-17 LAB — D-DIMER, QUANTITATIVE: D-Dimer, Quant: 0.99 ug/mL-FEU — ABNORMAL HIGH (ref 0.00–0.50)

## 2019-11-17 LAB — FERRITIN: Ferritin: 339 ng/mL — ABNORMAL HIGH (ref 24–336)

## 2019-11-17 LAB — TRIGLYCERIDES: Triglycerides: 24 mg/dL (ref <150)

## 2019-11-17 LAB — PROCALCITONIN: Procalcitonin: 0.1 ng/mL

## 2019-11-17 LAB — C-REACTIVE PROTEIN: CRP: 14 mg/dL — ABNORMAL HIGH (ref <1.0)

## 2019-11-17 LAB — BRAIN NATRIURETIC PEPTIDE: B Natriuretic Peptide: 408.4 pg/mL — ABNORMAL HIGH (ref 0.0–100.0)

## 2019-11-17 MED ORDER — IPRATROPIUM BROMIDE HFA 17 MCG/ACT IN AERS
2.0000 | INHALATION_SPRAY | RESPIRATORY_TRACT | Status: DC | PRN
  Administered 2019-11-19: 2 via RESPIRATORY_TRACT
  Filled 2019-11-17: qty 12.9

## 2019-11-17 MED ORDER — FINASTERIDE 5 MG PO TABS
5.0000 mg | ORAL_TABLET | Freq: Every day | ORAL | Status: DC
  Administered 2019-11-18 – 2019-11-20 (×3): 5 mg via ORAL
  Filled 2019-11-17 (×4): qty 1

## 2019-11-17 MED ORDER — SODIUM CHLORIDE 0.9 % IV SOLN
100.0000 mg | Freq: Every day | INTRAVENOUS | Status: DC
  Administered 2019-11-18 – 2019-11-20 (×3): 100 mg via INTRAVENOUS
  Filled 2019-11-17 (×4): qty 20

## 2019-11-17 MED ORDER — LORATADINE 10 MG PO TABS
10.0000 mg | ORAL_TABLET | Freq: Every day | ORAL | Status: DC
  Administered 2019-11-18 – 2019-11-20 (×3): 10 mg via ORAL
  Filled 2019-11-17 (×3): qty 1

## 2019-11-17 MED ORDER — ZINC SULFATE 220 (50 ZN) MG PO CAPS
220.0000 mg | ORAL_CAPSULE | Freq: Every day | ORAL | Status: DC
  Administered 2019-11-18 – 2019-11-20 (×3): 220 mg via ORAL
  Filled 2019-11-17 (×3): qty 1

## 2019-11-17 MED ORDER — ACETAMINOPHEN 325 MG PO TABS
650.0000 mg | ORAL_TABLET | Freq: Four times a day (QID) | ORAL | Status: DC | PRN
  Administered 2019-11-18 – 2019-11-19 (×2): 650 mg via ORAL
  Filled 2019-11-17 (×2): qty 2

## 2019-11-17 MED ORDER — UMECLIDINIUM BROMIDE 62.5 MCG/INH IN AEPB
1.0000 | INHALATION_SPRAY | Freq: Every day | RESPIRATORY_TRACT | Status: DC
  Administered 2019-11-18 – 2019-11-20 (×3): 1 via RESPIRATORY_TRACT
  Filled 2019-11-17 (×2): qty 7

## 2019-11-17 MED ORDER — GUAIFENESIN-DM 100-10 MG/5ML PO SYRP: 10.0000 mL | ORAL_SOLUTION | ORAL | Status: DC | PRN

## 2019-11-17 MED ORDER — MOMETASONE FURO-FORMOTEROL FUM 200-5 MCG/ACT IN AERO
2.0000 | INHALATION_SPRAY | Freq: Two times a day (BID) | RESPIRATORY_TRACT | Status: DC
  Administered 2019-11-17 – 2019-11-20 (×5): 2 via RESPIRATORY_TRACT
  Filled 2019-11-17 (×2): qty 8.8

## 2019-11-17 MED ORDER — HYDROCOD POLST-CPM POLST ER 10-8 MG/5ML PO SUER: 5.0000 mL | Freq: Two times a day (BID) | ORAL | Status: DC | PRN

## 2019-11-17 MED ORDER — APIXABAN 5 MG PO TABS
5.0000 mg | ORAL_TABLET | Freq: Two times a day (BID) | ORAL | Status: DC
  Administered 2019-11-17 – 2019-11-19 (×4): 5 mg via ORAL
  Filled 2019-11-17 (×4): qty 1

## 2019-11-17 MED ORDER — PRAMIPEXOLE DIHYDROCHLORIDE 0.25 MG PO TABS
0.5000 mg | ORAL_TABLET | Freq: Every day | ORAL | Status: DC
  Administered 2019-11-17 – 2019-11-19 (×3): 0.5 mg via ORAL
  Filled 2019-11-17 (×4): qty 2

## 2019-11-17 MED ORDER — VITAMIN C 500 MG PO TABS
500.0000 mg | ORAL_TABLET | Freq: Every day | ORAL | Status: DC
  Administered 2019-11-18 – 2019-11-20 (×3): 500 mg via ORAL
  Filled 2019-11-17 (×3): qty 1

## 2019-11-17 MED ORDER — SODIUM CHLORIDE 0.9 % IV BOLUS
500.0000 mL | Freq: Once | INTRAVENOUS | Status: AC
  Administered 2019-11-17: 500 mL via INTRAVENOUS

## 2019-11-17 MED ORDER — GUAIFENESIN ER 600 MG PO TB12
600.0000 mg | ORAL_TABLET | Freq: Two times a day (BID) | ORAL | Status: DC
  Administered 2019-11-17 – 2019-11-20 (×7): 600 mg via ORAL
  Filled 2019-11-17 (×6): qty 1

## 2019-11-17 MED ORDER — FLUOXETINE HCL 10 MG PO CAPS
10.0000 mg | ORAL_CAPSULE | Freq: Every day | ORAL | Status: DC
  Administered 2019-11-18 – 2019-11-20 (×3): 10 mg via ORAL
  Filled 2019-11-17 (×4): qty 1

## 2019-11-17 MED ORDER — POTASSIUM CHLORIDE CRYS ER 20 MEQ PO TBCR
40.0000 meq | EXTENDED_RELEASE_TABLET | Freq: Once | ORAL | Status: AC
  Administered 2019-11-17: 40 meq via ORAL
  Filled 2019-11-17: qty 2

## 2019-11-17 MED ORDER — SODIUM CHLORIDE 0.9 % IV SOLN
200.0000 mg | Freq: Once | INTRAVENOUS | Status: AC
  Administered 2019-11-17: 200 mg via INTRAVENOUS
  Filled 2019-11-17: qty 40

## 2019-11-17 MED ORDER — TAMSULOSIN HCL 0.4 MG PO CAPS
0.8000 mg | ORAL_CAPSULE | Freq: Every day | ORAL | Status: DC
  Administered 2019-11-18 – 2019-11-19 (×2): 0.8 mg via ORAL
  Filled 2019-11-17 (×2): qty 2

## 2019-11-17 MED ORDER — DEXAMETHASONE SODIUM PHOSPHATE 10 MG/ML IJ SOLN
10.0000 mg | Freq: Once | INTRAMUSCULAR | Status: AC
  Administered 2019-11-17: 10 mg via INTRAVENOUS
  Filled 2019-11-17: qty 1

## 2019-11-17 MED ORDER — AMIODARONE HCL IN DEXTROSE 360-4.14 MG/200ML-% IV SOLN
60.0000 mg/h | INTRAVENOUS | Status: AC
  Administered 2019-11-17: 60 mg/h via INTRAVENOUS
  Filled 2019-11-17: qty 200

## 2019-11-17 MED ORDER — OLANZAPINE 5 MG PO TABS
5.0000 mg | ORAL_TABLET | Freq: Every day | ORAL | Status: DC
  Filled 2019-11-17: qty 1

## 2019-11-17 MED ORDER — DOCUSATE SODIUM 100 MG PO CAPS
100.0000 mg | ORAL_CAPSULE | Freq: Two times a day (BID) | ORAL | Status: DC
  Administered 2019-11-17 – 2019-11-20 (×5): 100 mg via ORAL
  Filled 2019-11-17 (×5): qty 1

## 2019-11-17 MED ORDER — AMIODARONE HCL IN DEXTROSE 360-4.14 MG/200ML-% IV SOLN
30.0000 mg/h | INTRAVENOUS | Status: DC
  Administered 2019-11-18 – 2019-11-20 (×5): 30 mg/h via INTRAVENOUS
  Filled 2019-11-17 (×9): qty 200

## 2019-11-17 MED ORDER — DEXAMETHASONE SODIUM PHOSPHATE 10 MG/ML IJ SOLN: 6.0000 mg | INTRAMUSCULAR | Status: DC

## 2019-11-17 NOTE — ED Notes (Signed)
[facility] called, pt on list for transport

## 2019-11-17 NOTE — Progress Notes (Signed)
Patient arrived to room 148 from [facility] ED.  Assessment complete, VS obtained, and Admission database began.

## 2019-11-17 NOTE — ED Triage Notes (Addendum)
Pt presents from [facility] for SOB and low O2 that started today, EMS arrived to pt on 4L (regular flow) at 85% spO2.. Increased to 95% with NRB 15L. Rapid covid test at facility positive. H/o dementia, HTN, copd, CHF. EMS exam- afib at 100-130 on monitor, rhonchi bilat

## 2019-11-17 NOTE — H&P (Signed)
History and Physical    Ricardo Erickson [id number] DOB: Apr 19, 1941 DOA: 11/16/2019  PCP: Isaias Cowman Patient coming from: Nursing home  Chief Complaint: Shortness of breath  HPI: Ricardo Erickson is a 78 y.o. male with medical history significant of PE, COPD, chronic hypoxic respiratory failure on home oxygen, chronic diastolic congestive heart failure, paroxysmal atrial fibrillation, depression, dementia presenting from [facility] for evaluation of shortness of breath and hypoxia.  Per EMS, patient was satting 85% on his baseline 4 L oxygen via nasal cannula.  He was placed on 15 L oxygen via nonrebreather and sats improved to 95%. Found to be in A. fib with RVR with heart rate 100-130.  Patient received a gram of Tylenol at his facility prior to ED arrival.  SARS-CoV-2 rapid antigen test positive on 12/3.  No history could be obtained from the patient and he is currently in respiratory distress and requiring nonrebreather.  ED provider Dr. Darl Householder spoke with the patient's son who confirmed that the patient is DNR/DNI.  ED Course: Repeat SARS-CoV-2 antigen test positive.  Upon arrival to the ED, febrile with temperature of 103.9 F.  A. fib with RVR with heart rate in the 140s.  Significantly tachypneic with respiratory rate in the 30s.  ABG with pH 7.41, PCO2 45, and PO2 161.  Blood pressure low with systolic in the 0000000.  No leukocytosis.  Lactic acid 2.0.  LFTs normal.  Inflammatory markers elevated: CRP 14.0, D-dimer 0.99.  High-sensitivity troponin negative.  BNP elevated at 408.  Blood culture x2 pending. Chest x-ray showing cardiomegaly with prominent interstitial lung markings concerning for pulmonary edema.  Persistent but slightly improved bibasilar airspace opacities concerning for pneumonia. Pending labs: Procalcitonin Patient received Decadron 10 mg, a 500 cc normal saline bolus, and was started on amiodarone bolus plus infusion. Critical care and cardiology consulted.  They recommended  starting patient on amiodarone infusion.  Review of Systems:  All systems reviewed and apart from history of presenting illness, are negative.  Past Medical History:  Diagnosis Date   CHF (congestive heart failure) (HCC)    Chronic back pain    Dementia ([place])    Depression    Emphysema lung ([place])    Hypertension    Personal history of tobacco use, presenting hazards to health 08/06/2015   Pollen allergies    PTSD (post-traumatic stress disorder)    Pulmonary embolism [facility])     Past Surgical History:  Procedure Laterality Date   BACK SURGERY     KYPHOPLASTY N/A 06/27/2017   Procedure: KYPHOPLASTY -L5;  Surgeon: Hessie Knows, MD;  Location: [facility] ORS;  Service: Orthopedics;  Laterality: N/A;   TONSILLECTOMY AND ADENOIDECTOMY  1980     reports that he has been smoking cigarettes. He has a 30.00 pack-year smoking history. He has never used smokeless tobacco. He reports that he does not drink alcohol or use drugs.  Allergies  Allergen Reactions   Ativan [Lorazepam] Other (See Comments)    Patient gets severe altered mental status and confusion when taking this medicine    Family History  Problem Relation Age of Onset   Alcohol abuse Father    Arthritis Mother    Heart disease Maternal Grandmother    Prostate cancer Brother     Prior to Admission medications   Medication Sig Start Date End Date Taking? Authorizing Provider  acetaminophen (TYLENOL) 650 MG CR tablet Take 650 mg by mouth every 8 (eight) hours as needed for pain.  [provider]  ADVAIR DISKUS 250-50 MCG/DOSE AEPB Inhale 1 puff into the lungs every 12 (twelve) hours. 04/09/18   [provider]  albuterol (PROVENTIL HFA;VENTOLIN HFA) 108 (90 Base) MCG/ACT inhaler Inhale 2 puffs into the lungs every 4 (four) hours as needed for wheezing or shortness of breath.     [provider]  albuterol (PROVENTIL) (2.5 MG/3ML) 0.083% nebulizer solution Inhale 2.5 mg into the lungs 4  (four) times daily.  01/15/15   [provider]  amiodarone (PACERONE) 200 MG tablet Two tabs po daily for three more days then take one tablet daily afterwards Patient taking differently: Take 200 mg by mouth daily.  02/23/19   Loletha Grayer, MD  apixaban (ELIQUIS) 5 MG TABS tablet Take 1 tablet (5 mg total) by mouth 2 (two) times daily. 11/28/17   Gladstone Lighter, MD  docusate sodium (COLACE) 100 MG capsule Take 1 capsule (100 mg total) by mouth 2 (two) times daily. 02/23/19 02/23/20  Loletha Grayer, MD  finasteride (PROSCAR) 5 MG tablet Take 1 tablet (5 mg total) by mouth daily. 01/21/19   Festus Aloe, MD  FLUoxetine (PROZAC) 10 MG capsule Take 10 mg by mouth daily.    [provider]  furosemide (LASIX) 20 MG tablet Take 20 mg by mouth every morning.  06/29/18   [provider]  lidocaine (LIDODERM) 5 % Place 1 patch onto the skin daily. Remove & Discard patch within 12 hours or as directed by MD 11/28/18   Gladstone Lighter, MD  lisinopril (ZESTRIL) 5 MG tablet Take 1 tablet (5 mg total) by mouth daily. 07/23/19 08/22/19  Henreitta Leber, MD  memantine (NAMENDA) 5 MG tablet Take 1 tablet (5 mg total) by mouth 2 (two) times daily. 02/23/19   Loletha Grayer, MD  potassium chloride (K-DUR) 10 MEQ tablet Take 10 mEq by mouth daily.    [provider]  pramipexole (MIRAPEX) 1 MG tablet Take 1 tablet (1 mg total) by mouth at bedtime. 11/22/17   Gladstone Lighter, MD  risperiDONE (RISPERDAL) 0.25 MG tablet Take 1 tablet (0.25 mg total) by mouth at bedtime. 07/22/19 08/21/19  Henreitta Leber, MD  SPIRIVA HANDIHALER 18 MCG inhalation capsule Place 18 mcg into inhaler and inhale daily. 06/11/18   [provider]  tamsulosin (FLOMAX) 0.4 MG CAPS capsule Take 2 capsules (0.8 mg total) by mouth daily after supper. Patient taking differently: Take 0.4 mg by mouth daily after supper.  02/02/18   Festus Aloe, MD    Physical Exam: Vitals:   11/18/2019  1922 12/06/2019 1930 12/02/2019 2000 12/07/2019 2117  BP: (!) 91/58 (!) 101/53 105/66 111/79  Pulse: (!) 112 63 (!) 114 100  Resp: (!) 31 (!) 30 (!) 25 20  Temp:      TempSrc:      SpO2: 100% 100% 98% 100%    Physical Exam  Constitutional: He appears well-developed and well-nourished.  HENT:  Head: Normocephalic.  Eyes: Right eye exhibits no discharge. Left eye exhibits no discharge.  Neck: Neck supple.  Cardiovascular: Intact distal pulses.  Tachycardic Irregularly irregular rhythm  Pulmonary/Chest: He is in respiratory distress.  On 15 L oxygen via high flow nasal cannula/ NRB Tachypneic with respiratory rate in the 20s to 30s Diffuse rhonchi  Abdominal: Soft. Bowel sounds are normal. He exhibits no distension. There is no abdominal tenderness. There is no guarding.  Musculoskeletal:        General: No edema.  Neurological:  Awake and alert  Skin:  Skin is warm and dry. He is not diaphoretic.     Labs on Admission: I have personally reviewed following labs and imaging studies  CBC: Recent Labs  Lab 11/14/19 1709 11/24/2019 1753 11/29/2019 1816  WBC 6.9 9.1  --   NEUTROABS 4.3 7.9*  --   HGB 10.6* 13.3 12.9*  HCT 35.9* 42.1 38.0*  MCV 93.7 89.4  --   PLT 221 215  --    Basic Metabolic Panel: Recent Labs  Lab 11/14/19 1709 11/30/2019 1753 12/01/2019 1816  NA 142 137 139  K 3.7 3.6 3.4*  CL 107 105  --   CO2 27 24  --   GLUCOSE 86 146*  --   BUN 15 18  --   CREATININE 0.99 1.02  --   CALCIUM 7.9* 8.0*  --    GFR: CrCl cannot be calculated (Unknown ideal weight.). Liver Function Tests: Recent Labs  Lab 11/28/2019 1753  AST 16  ALT 7  ALKPHOS 91  BILITOT 0.8  PROT 5.5*  ALBUMIN 2.3*   No results for input(s): LIPASE, AMYLASE in the last 168 hours. No results for input(s): AMMONIA in the last 168 hours. Coagulation Profile: No results for input(s): INR, PROTIME in the last 168 hours. Cardiac Enzymes: No results for input(s): CKTOTAL, CKMB, CKMBINDEX,  TROPONINI in the last 168 hours. BNP (last 3 results) No results for input(s): PROBNP in the last 8760 hours. HbA1C: No results for input(s): HGBA1C in the last 72 hours. CBG: No results for input(s): GLUCAP in the last 168 hours. Lipid Profile: Recent Labs    12/07/2019 1753  TRIG 24   Thyroid Function Tests: No results for input(s): TSH, T4TOTAL, FREET4, T3FREE, THYROIDAB in the last 72 hours. Anemia Panel: Recent Labs    11/16/2019 1753  FERRITIN 339*   Urine analysis:    Component Value Date/Time   COLORURINE COLORLESS (A) 07/18/2019 2143   APPEARANCEUR CLEAR (A) 07/18/2019 2143   APPEARANCEUR Clear 02/02/2018 0942   LABSPEC 1.005 07/18/2019 2143   PHURINE 7.0 07/18/2019 2143   GLUCOSEU NEGATIVE 07/18/2019 2143   HGBUR NEGATIVE 07/18/2019 2143   BILIRUBINUR NEGATIVE 07/18/2019 2143   BILIRUBINUR Negative 02/02/2018 [place] 07/18/2019 2143   PROTEINUR NEGATIVE 07/18/2019 2143   UROBILINOGEN 0.2 06/19/2017 1216   NITRITE NEGATIVE 07/18/2019 2143   LEUKOCYTESUR NEGATIVE 07/18/2019 2143    Radiological Exams on Admission: Dg Chest Port 1 View  Result Date: 11/19/2019 CLINICAL DATA:  Shortness of breath. EXAM: PORTABLE CHEST 1 VIEW COMPARISON:  11/14/2019 FINDINGS: There is cardiomegaly with evidence for developing pulmonary edema. Bibasilar airspace opacities are again noted. There is elevation of the left hemidiaphragm. There is no pneumothorax. There may be a left-sided pleural effusion. There is no acute osseous abnormality. IMPRESSION: 1. Cardiomegaly with prominent interstitial lung markings may represent pulmonary edema. 2. Persistent but slightly improved bibasilar airspace opacities which may represent atelectasis or pneumonia. Electronically Signed   By: Constance Holster M.D.   On: 12/02/2019 19:06    EKG: Independently reviewed.  Wide-complex tachycardia, heart rate 154.  QTc 517.  Assessment/Plan Principal Problem:   Pneumonia due to  COVID-19 virus Active Problems:   Pulmonary emboli (HCC)   CHF exacerbation (HCC)   Acute on chronic respiratory failure with hypoxia (HCC)   Atrial fibrillation with rapid ventricular response (HCC)   Acute on chronic hypoxic respiratory failure secondary to COVID-19 viral pneumonia and acute on chronic diastolic congestive heart failure Oxygen saturation 85% on baseline  4 L supplemental oxygen.  Currently requiring 15 L oxygen via high flow nasal cannula/NRB.  Tachypneic with respiratory rate in the 20s to 30s.  ABG with pH 7.41, PCO2 45, and PO2 161.  Rapid SARS-CoV-2 antigen test positive.  Febrile. No leukocytosis.  Lactic acid 2.0.  Chest x-ray showing cardiomegaly with prominent interstitial lung markings concerning for pulmonary edema and bibasilar airspace opacities concerning for pneumonia.  Bacterial pneumonia less likely procalcitonin 0.10. Inflammatory markers elevated: CRP 14.0, D-dimer 0.99. BNP elevated at 408.  -Remdesivir dosing per pharmacy -IV Decadron 6 mg daily -Vitamin C and zinc -Antitussives as needed -Tylenol as needed -Daily CBC with differential, CMP, CRP, D-dimer, LDH -Airborne and contact precautions -Continuous pulse ox -Keep oxygen saturation above 88% given underlying COPD -Transition to heated high flow nasal cannula if requiring >6L/ min for comfort -Blood culture x2 pending -Last echo with ED 55 to 60% and unable to evaluate diastolic function.  Will hold off Lasix at this time as blood pressure soft.  Lasix can be given after blood pressure improves. Repeat echocardiogram. Monitor intake and output, daily weights, low-sodium diet with fluid restriction.  A. fib with RVR Patient found to be in A. fib with RVR with heart rate up to 140s on arrival to ED.  Likely precipitated by hypoxia secondary to COVID-19 viral pneumonia and volume overload from acute on chronic diastolic CHF.  He was started on amiodarone bolus plus infusion per cardiology recommendation.   Rate currently ranging from low 100s to 120s. CHA2DS2VASc at least 3.  -Continue amiodarone infusion -Continue home Eliquis for anticoagulation  History of PE -Continue home Eliquis for anticoagulation  COPD -Transition home inhaler therapy to Dulera 200-50 mcg 2 puffs twice daily and Incruse Ellipta 62.5 mg 1 inhalation daily per PCCM recommendation. Avoid short acting beta adrenergic meds given tachycardia, use Atrovent for PRN use.  BPH -Continue home finasteride and tamsulosin  QT prolongation -Cardiac monitoring -Keep potassium above 4 and magnesium above 2 -Takes Zyprexa at home. Hold at this time.  Avoid QT prolonging drugs if possible. -Repeat EKG in a.m.  DVT prophylaxis: Eliquis Code Status: DNR/DNI Family Communication: No family available at this time. Disposition Plan: Anticipate discharge after clinical improvement. Consults called: PCCM, cardiology Admission status: It is my clinical opinion that admission to INPATIENT is reasonable and necessary in this 78 y.o. male  presenting with acute on chronic hypoxic respiratory failure secondary to COVID-19 viral pneumonia and acute on chronic diastolic CHF.  Patient is currently on nonrebreather.  Also A. fib with RVR requiring amiodarone infusion.  High risk of decompensation.  Given the aforementioned, the predictability of an adverse outcome is felt to be significant. I expect that the patient will require at least 2 midnights in the hospital to treat this condition.   The medical decision making on this patient was of high complexity and the patient is at high risk for clinical deterioration, therefore this is a level 3 visit.  Shela Leff MD [facility] Pager [phone]  If 7PM-7AM, please contact night-coverage [email or web address] Password [facility]  11/23/2019, 9:43 PM

## 2019-11-17 NOTE — Consult Note (Signed)
NAME:  Ricardo Erickson, MRN:  [id number], DOB:  11-Dec-1941, LOS: 0 ADMISSION DATE:  11/19/2019, CONSULTATION DATE:  11/19/2019 REFERRING MD:  Dr. Darl Householder, CHIEF COMPLAINT:  Fever, dyspnea  Brief History   78 y/o brought in from assisted living with Covid-19 and hypoxia, found to be in Afib, DNR/DNI.  History of present illness   78 y.o. M with PMH of emphysema not on home O2, CVA, PE and Dementia who was diagnosed with Covid-19 two days ago.  He saturations dropped at his assisted living facility and he had nausea and vomiting, so was brought to the ED where he was initially 85% on 4L and febrile to 103F.    In the ED, pt required 15-30L [place] oxygen, CXR with likely edema and slightly improving bilateral airspace opacities compared to prior films. Heart rhythm Atrial Fibrillation with rate 100-130 and BP soft.  BNP was 400 and inflammatory markers elevated.  He was given 500L bolus, Dexamethasone and started on Amiodarone gtt.  ABG 7.41/45/161/28.6; Pt is DNR/DNI.  He was sleeping, but arousable to voice and answering questions.  PCCM consulted for evaluation  Past Medical History   has a past medical history of CHF (congestive heart failure) ([place]), Chronic back pain, Dementia ([place]), Depression, Emphysema lung ([place]), Hypertension, Personal history of tobacco use, presenting hazards to health (08/06/2015), Pollen allergies, PTSD (post-traumatic stress disorder), and Pulmonary embolism ([place]).   Significant Hospital Events   12/6 Presented to ED, PCCM consult  Consults:  PCCM  Procedures:    Significant Diagnostic Tests:  12/6 CXR>>Cardiomegaly with prominent interstitial markings, persistent but slightly improved bibasilar markings may represent atelectasis or PNA  Micro Data:  12/6 BCx2>>  Antimicrobials:     Interim history/subjective:  Pt resting on HFNC, arousable and not in severe distress  Objective   Blood pressure 105/66, pulse (!) 114, temperature (!) 103.9 F (39.9 C),  temperature source Rectal, resp. rate (!) 25, SpO2 98 %.    FiO2 (%):  [100 %] 100 %   Intake/Output Summary (Last 24 hours) at 12/05/2019 2030 Last data filed at 12/02/2019 2009 Gross per 24 hour  Intake 500 ml  Output -  Net 500 ml   There were no vitals filed for this visit.  General:   Elderly, chronically ill male, sleeping but arousable and not in severe distress HEENT: MM pink/moist Neuro: sleeping, arousable to voice and will answer questions with one word answers, oriented to person CV: s1s2 tachycardic, irregular rhythm, no m/r/g PULM:  tachypneic without retractions or accessory muscle use on 30L [place] O2, bilateral rhonchi GI: soft, bsx4 active  Extremities: warm/dry, no edema  Skin: no rashes or lesions  Resolved Hospital Problem list       Assessment & Plan:    Acute Hypoxic Respiratory Failure secondary to Covid-19, underlying emphysema and with possible pulmonary edema -Would continue with HFNC, treat Covid with Dexamethasone, Remdesevir, consider convalscent plasma -Last echo 02/2019 with EF of 55-60% but unable to evaluate diastolic function, would diurese as able -Atrovent HFA given tachycardia, Dulera 200-50Mcg, and increase Ellipta to 62.19mcg  Atrial Fibrillation with RVR, history of HFpEF -HR improved after Amiodarone bolus, can likely d/c gtt and transition to home amio dose -consider obtaining Echo -continue home Eliquis   Dementia -continue Zyprexa and Risperdone -Would likely benefit from palliative care consult      Labs   CBC: Recent Labs  Lab 11/14/19 1709 11/29/2019 1753 12/06/2019 1816  WBC 6.9 9.1  --   NEUTROABS  4.3 7.9*  --   HGB 10.6* 13.3 12.9*  HCT 35.9* 42.1 38.0*  MCV 93.7 89.4  --   PLT 221 215  --     Basic Metabolic Panel: Recent Labs  Lab 11/14/19 1709 12/09/2019 1753 12/08/2019 1816  NA 142 137 139  K 3.7 3.6 3.4*  CL 107 105  --   CO2 27 24  --   GLUCOSE 86 146*  --   BUN 15 18  --   CREATININE 0.99 1.02  --    CALCIUM 7.9* 8.0*  --    GFR: CrCl cannot be calculated (Unknown ideal weight.). Recent Labs  Lab 11/14/19 1709 11/19/2019 1753 12/11/2019 1930  PROCALCITON  --  0.10  --   WBC 6.9 9.1  --   LATICACIDVEN  --  2.0* 1.5    Liver Function Tests: Recent Labs  Lab 12/12/2019 1753  AST 16  ALT 7  ALKPHOS 91  BILITOT 0.8  PROT 5.5*  ALBUMIN 2.3*   No results for input(s): LIPASE, AMYLASE in the last 168 hours. No results for input(s): AMMONIA in the last 168 hours.  ABG    Component Value Date/Time   PHART 7.417 12/12/2019 1816   PCO2ART 45.4 11/15/2019 1816   PO2ART 161.0 (H) 11/28/2019 1816   HCO3 28.6 (H) 11/21/2019 1816   TCO2 30 11/19/2019 1816   O2SAT 99.0 12/03/2019 1816     Coagulation Profile: No results for input(s): INR, PROTIME in the last 168 hours.  Cardiac Enzymes: No results for input(s): CKTOTAL, CKMB, CKMBINDEX, TROPONINI in the last 168 hours.  HbA1C: Hgb A1c MFr Bld  Date/Time Value Ref Range Status  01/18/2017 10:32 AM 6.0 4.6 - 6.5 % Final    Comment:    Glycemic Control Guidelines for People with Diabetes:Non Diabetic:  <6%Goal of Therapy: <7%Additional Action Suggested:  >8%   04/13/2016 11:11 AM 6.1 4.6 - 6.5 % Final    Comment:    Glycemic Control Guidelines for People with Diabetes:Non Diabetic:  <6%Goal of Therapy: <7%Additional Action Suggested:  >8%     CBG: No results for input(s): GLUCAP in the last 168 hours.  Review of Systems:   Unable to obtain secondary to dementia  Past Medical History  He,  has a past medical history of CHF (congestive heart failure) ([place]), Chronic back pain, Dementia ([place]), Depression, Emphysema lung ([place]), Hypertension, Personal history of tobacco use, presenting hazards to health (08/06/2015), Pollen allergies, PTSD (post-traumatic stress disorder), and Pulmonary embolism ([place]).   Surgical History    Past Surgical History:  Procedure Laterality Date  . BACK SURGERY    . KYPHOPLASTY N/A 06/27/2017    Procedure: KYPHOPLASTY -L5;  Surgeon: Hessie Knows, MD;  Location: [facility] ORS;  Service: Orthopedics;  Laterality: N/A;  . [place]     Social History   reports that he has been smoking cigarettes. He has a 30.00 pack-year smoking history. He has never used smokeless tobacco. He reports that he does not drink alcohol or use drugs.   Family History   His family history includes Alcohol abuse in his father; Arthritis in his mother; Heart disease in his maternal grandmother; Prostate cancer in his brother.   Allergies Allergies  Allergen Reactions  . Ativan [Lorazepam] Other (See Comments)    Patient gets severe altered mental status and confusion when taking this medicine     Home Medications  Prior to Admission medications   Medication Sig Start Date End Date Taking?  Authorizing Provider  acetaminophen (TYLENOL) 650 MG CR tablet Take 650 mg by mouth every 8 (eight) hours as needed for pain.    [provider]  ADVAIR DISKUS 250-50 MCG/DOSE AEPB Inhale 1 puff into the lungs every 12 (twelve) hours. 04/09/18   [provider]  albuterol (PROVENTIL HFA;VENTOLIN HFA) 108 (90 Base) MCG/ACT inhaler Inhale 2 puffs into the lungs every 4 (four) hours as needed for wheezing or shortness of breath.     [provider]  albuterol (PROVENTIL) (2.5 MG/3ML) 0.083% nebulizer solution Inhale 2.5 mg into the lungs 4 (four) times daily.  01/15/15   [provider]  amiodarone (PACERONE) 200 MG tablet Two tabs po daily for three more days then take one tablet daily afterwards Patient taking differently: Take 200 mg by mouth daily.  02/23/19   Loletha Grayer, MD  apixaban (ELIQUIS) 5 MG TABS tablet Take 1 tablet (5 mg total) by mouth 2 (two) times daily. 11/28/17   Gladstone Lighter, MD  docusate sodium (COLACE) 100 MG capsule Take 1 capsule (100 mg total) by mouth 2 (two) times daily. 02/23/19 02/23/20  Loletha Grayer, MD  finasteride (PROSCAR) 5  MG tablet Take 1 tablet (5 mg total) by mouth daily. 01/21/19   Festus Aloe, MD  FLUoxetine (PROZAC) 10 MG capsule Take 10 mg by mouth daily.    [provider]  furosemide (LASIX) 20 MG tablet Take 20 mg by mouth every morning.  06/29/18   [provider]  lidocaine (LIDODERM) 5 % Place 1 patch onto the skin daily. Remove & Discard patch within 12 hours or as directed by MD 11/28/18   Gladstone Lighter, MD  lisinopril (ZESTRIL) 5 MG tablet Take 1 tablet (5 mg total) by mouth daily. 07/23/19 08/22/19  Henreitta Leber, MD  memantine (NAMENDA) 5 MG tablet Take 1 tablet (5 mg total) by mouth 2 (two) times daily. 02/23/19   Loletha Grayer, MD  potassium chloride (K-DUR) 10 MEQ tablet Take 10 mEq by mouth daily.    [provider]  pramipexole (MIRAPEX) 1 MG tablet Take 1 tablet (1 mg total) by mouth at bedtime. 11/22/17   Gladstone Lighter, MD  risperiDONE (RISPERDAL) 0.25 MG tablet Take 1 tablet (0.25 mg total) by mouth at bedtime. 07/22/19 08/21/19  Henreitta Leber, MD  SPIRIVA HANDIHALER 18 MCG inhalation capsule Place 18 mcg into inhaler and inhale daily. 06/11/18   [provider]  tamsulosin (FLOMAX) 0.4 MG CAPS capsule Take 2 capsules (0.8 mg total) by mouth daily after supper. Patient taking differently: Take 0.4 mg by mouth daily after supper.  02/02/18   Festus Aloe, MD     Otilio Carpen Keyoni Lapinski, PA-C [place] [facility]  Pager# [phone], if no answer [phone]

## 2019-11-17 NOTE — ED Provider Notes (Signed)
[facility] EMERGENCY DEPARTMENT Provider Note   CSN: [id number] Arrival date & time: 11/19/2019  1750     History   Chief Complaint Chief Complaint  Patient presents with  . Shortness of Breath    HPI Ricardo Erickson is a 78 y.o. male hx of CHF, dementia, HTN, here presenting with low oxygen, shortness of breath.  Patient was seen here 2 days ago and was diagnosed with COVID-19.  Patient has a MOST form and apparently states that he is DNR.  Patient is complaining of shortness of breath and was noted to be hypoxic to 85% on his baseline 4 L nasal cannula.  He was put on a nonrebreather.  Patient was also febrile and in rapid A. fib and was given Tylenol a gram prior to arrival.      The history is provided by the nursing home and the EMS personnel.  Level V caveat- dementia, condition of patient   Past Medical History:  Diagnosis Date  . CHF (congestive heart failure) ([place])   . Chronic back pain   . Dementia ([place])   . Depression   . Emphysema lung ([place])   . Hypertension   . Personal history of tobacco use, presenting hazards to health 08/06/2015  . Pollen allergies   . PTSD (post-traumatic stress disorder)   . Pulmonary embolism [facility])     Patient Active Problem List   Diagnosis Date Noted  . Dementia ([place]) 07/19/2019  . CHF (congestive heart failure) ([place]) 07/18/2019  . Acute exacerbation of congestive heart failure ([place]) 04/12/2019  . Influenza A 02/15/2019  . Pneumonia 11/27/2018  . Pulmonary emboli ([place]) 11/18/2017  . Tobacco use disorder 07/24/2017  . Encephalopathy   . Palliative care encounter   . Goals of care, counseling/discussion   . Pulmonary embolus ([place])   . UTI (urinary tract infection) 07/19/2017  . PTSD (post-traumatic stress disorder) 06/28/2017  . Adjustment disorder with anxiety 06/28/2017  . Closed compression fracture of L5 lumbar vertebra   . Left low back pain   . Weakness   . A-fib ([place]) 06/24/2017  . Prediabetes  01/18/2017  . Prostate cancer ([place]) 01/18/2017  . Anxiety 01/18/2017  . Preventative health care 04/15/2016  . Restless leg syndrome 04/03/2016  . COPD (chronic obstructive pulmonary disease) ([place]) 04/03/2016  . BPH (benign prostatic hyperplasia) 04/03/2016  . Personal history of tobacco use, presenting hazards to health 08/06/2015    Past Surgical History:  Procedure Laterality Date  . BACK SURGERY    . KYPHOPLASTY N/A 06/27/2017   Procedure: KYPHOPLASTY -L5;  Surgeon: Hessie Knows, MD;  Location: [facility] ORS;  Service: Orthopedics;  Laterality: N/A;  . TONSILLECTOMY AND ADENOIDECTOMY  1980        Home Medications    Prior to Admission medications   Medication Sig Start Date End Date Taking? Authorizing Provider  acetaminophen (TYLENOL) 650 MG CR tablet Take 650 mg by mouth every 8 (eight) hours as needed for pain.    [provider]  ADVAIR DISKUS 250-50 MCG/DOSE AEPB Inhale 1 puff into the lungs every 12 (twelve) hours. 04/09/18   [provider]  albuterol (PROVENTIL HFA;VENTOLIN HFA) 108 (90 Base) MCG/ACT inhaler Inhale 2 puffs into the lungs every 4 (four) hours as needed for wheezing or shortness of breath.     [provider]  albuterol (PROVENTIL) (2.5 MG/3ML) 0.083% nebulizer solution Inhale 2.5 mg into the lungs 4 (four) times daily.  01/15/15   [provider]  amiodarone (PACERONE) 200 MG tablet Two tabs po daily for three more days then take one tablet daily afterwards Patient taking differently: Take 200 mg by mouth daily.  02/23/19   Loletha Grayer, MD  apixaban (ELIQUIS) 5 MG TABS tablet Take 1 tablet (5 mg total) by mouth 2 (two) times daily. 11/28/17   Gladstone Lighter, MD  docusate sodium (COLACE) 100 MG capsule Take 1 capsule (100 mg total) by mouth 2 (two) times daily. 02/23/19 02/23/20  Loletha Grayer, MD  finasteride (PROSCAR) 5 MG tablet Take 1 tablet (5 mg total) by mouth daily. 01/21/19   Festus Aloe, MD  FLUoxetine  (PROZAC) 10 MG capsule Take 10 mg by mouth daily.    [provider]  furosemide (LASIX) 20 MG tablet Take 20 mg by mouth every morning.  06/29/18   [provider]  lidocaine (LIDODERM) 5 % Place 1 patch onto the skin daily. Remove & Discard patch within 12 hours or as directed by MD 11/28/18   Gladstone Lighter, MD  lisinopril (ZESTRIL) 5 MG tablet Take 1 tablet (5 mg total) by mouth daily. 07/23/19 08/22/19  Henreitta Leber, MD  memantine (NAMENDA) 5 MG tablet Take 1 tablet (5 mg total) by mouth 2 (two) times daily. 02/23/19   Loletha Grayer, MD  potassium chloride (K-DUR) 10 MEQ tablet Take 10 mEq by mouth daily.    [provider]  pramipexole (MIRAPEX) 1 MG tablet Take 1 tablet (1 mg total) by mouth at bedtime. 11/22/17   Gladstone Lighter, MD  risperiDONE (RISPERDAL) 0.25 MG tablet Take 1 tablet (0.25 mg total) by mouth at bedtime. 07/22/19 08/21/19  Henreitta Leber, MD  SPIRIVA HANDIHALER 18 MCG inhalation capsule Place 18 mcg into inhaler and inhale daily. 06/11/18   [provider]  tamsulosin (FLOMAX) 0.4 MG CAPS capsule Take 2 capsules (0.8 mg total) by mouth daily after supper. Patient taking differently: Take 0.4 mg by mouth daily after supper.  02/02/18   Festus Aloe, MD    Family History Family History  Problem Relation Age of Onset  . Alcohol abuse Father   . Arthritis Mother   . Heart disease Maternal Grandmother   . Prostate cancer Brother     Social History Social History   Tobacco Use  . Smoking status: Current Every Day Smoker    Packs/day: 0.50    Years: 60.00    Pack years: 30.00    Types: Cigarettes  . Smokeless tobacco: Never Used  Substance Use Topics  . Alcohol use: No    Alcohol/week: 0.0 standard drinks  . Drug use: No     Allergies   Ativan [lorazepam]   Review of Systems Review of Systems  Respiratory: Positive for shortness of breath.   All other systems reviewed and are negative.    Physical Exam  Updated Vital Signs BP 105/66   Pulse (!) 114   Temp (!) 103.9 F (39.9 C) (Rectal)   Resp (!) 25   SpO2 98%   Physical Exam Vitals signs and nursing note reviewed.  Constitutional:      Comments: Tachypneic, short of breath   HENT:     Head: Normocephalic.  Eyes:     Pupils: Pupils are equal, round, and reactive to light.  Neck:     Musculoskeletal: Normal range of motion.  Cardiovascular:     Rate and Rhythm: Regular rhythm. Tachycardia present.  Pulmonary:     Comments: Diminished throughout, +rhonchi  Abdominal:     General:  Bowel sounds are normal.     Palpations: Abdomen is soft.  Musculoskeletal: Normal range of motion.  Skin:    General: Skin is warm.     Capillary Refill: Capillary refill takes less than 2 seconds.  Neurological:     General: No focal deficit present.     Comments: Confused (demented at baseline), moving all extremities.   Psychiatric:        Mood and Affect: Mood normal.      ED Treatments / Results  Labs (all labs ordered are listed, but only abnormal results are displayed) Labs Reviewed  LACTIC ACID, PLASMA - Abnormal; Notable for the following components:      Result Value   Lactic Acid, Venous 2.0 (*)    All other components within normal limits  CBC WITH DIFFERENTIAL/PLATELET - Abnormal; Notable for the following components:   Neutro Abs 7.9 (*)    Lymphs Abs 0.4 (*)    All other components within normal limits  COMPREHENSIVE METABOLIC PANEL - Abnormal; Notable for the following components:   Glucose, Bld 146 (*)    Calcium 8.0 (*)    Total Protein 5.5 (*)    Albumin 2.3 (*)    All other components within normal limits  FERRITIN - Abnormal; Notable for the following components:   Ferritin 339 (*)    All other components within normal limits  C-REACTIVE PROTEIN - Abnormal; Notable for the following components:   CRP 14.0 (*)    All other components within normal limits  BRAIN NATRIURETIC PEPTIDE - Abnormal; Notable for the  following components:   B Natriuretic Peptide 408.4 (*)    All other components within normal limits  D-DIMER, QUANTITATIVE (NOT AT [facility]) - Abnormal; Notable for the following components:   D-Dimer, Quant 0.99 (*)    All other components within normal limits  POCT I-STAT 7, (LYTES, BLD GAS, ICA,H+H) - Abnormal; Notable for the following components:   pO2, Arterial 161.0 (*)    Bicarbonate 28.6 (*)    Acid-Base Excess 4.0 (*)    Potassium 3.4 (*)    Calcium, Ion 1.14 (*)    HCT 38.0 (*)    Hemoglobin 12.9 (*)    All other components within normal limits  POC SARS CORONAVIRUS 2 AG -  ED - Abnormal; Notable for the following components:   SARS Coronavirus 2 Ag POSITIVE (*)    All other components within normal limits  CULTURE, BLOOD (ROUTINE X 2)  CULTURE, BLOOD (ROUTINE X 2)  LACTIC ACID, PLASMA  PROCALCITONIN  LACTATE DEHYDROGENASE  TRIGLYCERIDES  BLOOD GAS, ARTERIAL  TROPONIN I (HIGH SENSITIVITY)  TROPONIN I (HIGH SENSITIVITY)    EKG EKG Interpretation  Date/Time:  Sunday November 17 2019 17:52:50 EST Ventricular Rate:  154 PR Interval:    QRS Duration: 149 QT Interval:  323 QTC Calculation: 517 R Axis:   -148 Text Interpretation: Extreme tachycardia with wide complex, no further rhythm analysis attempted Since last tracing rate faster Confirmed by Wandra Arthurs [phone]) on 12/11/2019 6:01:54 PM   Radiology Dg Chest Port 1 View  Result Date: 11/27/2019 CLINICAL DATA:  Shortness of breath. EXAM: PORTABLE CHEST 1 VIEW COMPARISON:  11/14/2019 FINDINGS: There is cardiomegaly with evidence for developing pulmonary edema. Bibasilar airspace opacities are again noted. There is elevation of the left hemidiaphragm. There is no pneumothorax. There may be a left-sided pleural effusion. There is no acute osseous abnormality. IMPRESSION: 1. Cardiomegaly with prominent interstitial lung markings may represent pulmonary edema. 2.  Persistent but slightly improved bibasilar airspace  opacities which may represent atelectasis or pneumonia. Electronically Signed   By: Constance Holster M.D.   On: 12/12/2019 19:06    Procedures Procedures (including critical care time)  CRITICAL CARE Performed by: Wandra Arthurs   Total critical care time:45 minutes  Critical care time was exclusive of separately billable procedures and treating other patients.  Critical care was necessary to treat or prevent imminent or life-threatening deterioration.  Critical care was time spent personally by me on the following activities: development of treatment plan with patient and/or surrogate as well as nursing, discussions with consultants, evaluation of patient's response to treatment, examination of patient, obtaining history from patient or surrogate, ordering and performing treatments and interventions, ordering and review of laboratory studies, ordering and review of radiographic studies, pulse oximetry and re-evaluation of patient's condition.  Medications Ordered in ED Medications  amiodarone (NEXTERONE PREMIX) 360-4.14 MG/200ML-% (1.8 mg/mL) IV infusion (60 mg/hr Intravenous New Bag/Given 11/28/2019 2008)  amiodarone (NEXTERONE PREMIX) 360-4.14 MG/200ML-% (1.8 mg/mL) IV infusion (has no administration in time range)  dexamethasone (DECADRON) injection 10 mg (10 mg Intravenous Given 12/10/2019 1857)  sodium chloride 0.9 % bolus 500 mL (0 mLs Intravenous Stopped 12/12/2019 2009)     Initial Impression / Assessment and Plan / ED Course  I have reviewed the triage vital signs and the nursing notes.  Pertinent labs & imaging results that were available during my care of the patient were reviewed by me and considered in my medical decision making (see chart for details).        Ricardo Erickson is a 78 y.o. male here with SOB. Recent diagnosis of COVID 19 and is on 4 L [place] at baseline. He is hypoxic on that and is tachypneic. Will get [facility] preadmission labs. Will give IV steroids.   7:30 pm  Patient hypotensive. In rapid afib. He is on eliquis. Talked to the son who affirmed that he is DNR/DNI. I put in order for DNR. Talked to hospitalist. Dr. Marlowe Sax felt that he is too unstable for step down and may need pressors and diuresis. Recommend cardiology and critical care consult   8:34 PM Critical care saw patient and recommend amiodarone drip. HR down to low 100s with amiodarone drip and BP up to 105/66. Cardiology agreed with amiodarone drip as well. Hospitalist to admit to stepdown    Ricardo Erickson was evaluated in Emergency Department on 11/26/2019 for the symptoms described in the history of present illness. He was evaluated in the context of the global COVID-19 pandemic, which necessitated consideration that the patient might be at risk for infection with the SARS-CoV-2 virus that causes COVID-19. Institutional protocols and algorithms that pertain to the evaluation of patients at risk for COVID-19 are in a state of rapid change based on information released by regulatory bodies including the CDC and federal and state organizations. These policies and algorithms were followed during the patient's care in the ED.  Final Clinical Impressions(s) / ED Diagnoses   Final diagnoses:  None    ED Discharge Orders    None       Drenda Freeze, MD 12/01/2019 2035

## 2019-11-18 ENCOUNTER — Other Ambulatory Visit: Payer: Self-pay

## 2019-11-18 ENCOUNTER — Inpatient Hospital Stay (HOSPITAL_COMMUNITY): Payer: Medicare Other

## 2019-11-18 ENCOUNTER — Encounter (HOSPITAL_COMMUNITY): Payer: Self-pay

## 2019-11-18 DIAGNOSIS — I509 Heart failure, unspecified: Secondary | ICD-10-CM

## 2019-11-18 LAB — COMPREHENSIVE METABOLIC PANEL
ALT: 7 U/L (ref 0–44)
AST: 16 U/L (ref 15–41)
Albumin: 2.7 g/dL — ABNORMAL LOW (ref 3.5–5.0)
Alkaline Phosphatase: 89 U/L (ref 38–126)
Anion gap: 12 (ref 5–15)
BUN: 18 mg/dL (ref 8–23)
CO2: 29 mmol/L (ref 22–32)
Calcium: 8.4 mg/dL — ABNORMAL LOW (ref 8.9–10.3)
Chloride: 103 mmol/L (ref 98–111)
Creatinine, Ser: 0.79 mg/dL (ref 0.61–1.24)
GFR calc Af Amer: >60 mL/min (ref >60)
GFR calc non Af Amer: >60 mL/min (ref >60)
Glucose, Bld: 127 mg/dL — ABNORMAL HIGH (ref 70–99)
Potassium: 4.3 mmol/L (ref 3.5–5.1)
Sodium: 144 mmol/L (ref 135–145)
Total Bilirubin: 0.9 mg/dL (ref 0.3–1.2)
Total Protein: 6 g/dL — ABNORMAL LOW (ref 6.5–8.1)

## 2019-11-18 LAB — ECHOCARDIOGRAM LIMITED: Height: 67 in

## 2019-11-18 LAB — CBC WITH DIFFERENTIAL/PLATELET
Abs Immature Granulocytes: 0.08 10*3/uL — ABNORMAL HIGH (ref 0.00–0.07)
Basophils Absolute: 0 10*3/uL (ref 0.0–0.1)
Basophils Relative: 0 %
Eosinophils Absolute: 0 10*3/uL (ref 0.0–0.5)
Eosinophils Relative: 0 %
HCT: 44.2 % (ref 39.0–52.0)
Hemoglobin: 13.3 g/dL (ref 13.0–17.0)
Immature Granulocytes: 1 %
Lymphocytes Relative: 5 %
Lymphs Abs: 0.5 10*3/uL — ABNORMAL LOW (ref 0.7–4.0)
MCH: 27.5 pg (ref 26.0–34.0)
MCHC: 30.1 g/dL (ref 30.0–36.0)
MCV: 91.3 fL (ref 80.0–100.0)
Monocytes Absolute: 0.5 10*3/uL (ref 0.1–1.0)
Monocytes Relative: 5 %
Neutro Abs: 9 10*3/uL — ABNORMAL HIGH (ref 1.7–7.7)
Neutrophils Relative %: 89 %
Platelets: 200 10*3/uL (ref 150–400)
RBC: 4.84 MIL/uL (ref 4.22–5.81)
RDW: 14.3 % (ref 11.5–15.5)
WBC: 10.1 10*3/uL (ref 4.0–10.5)
nRBC: 0 % (ref 0.0–0.2)

## 2019-11-18 LAB — FERRITIN: Ferritin: 317 ng/mL (ref 24–336)

## 2019-11-18 LAB — LACTATE DEHYDROGENASE: LDH: 192 U/L (ref 98–192)

## 2019-11-18 LAB — D-DIMER, QUANTITATIVE: D-Dimer, Quant: 0.96 ug/mL-FEU — ABNORMAL HIGH (ref 0.00–0.50)

## 2019-11-18 LAB — ABO/RH: ABO/RH(D): O POS

## 2019-11-18 LAB — C-REACTIVE PROTEIN: CRP: 18.1 mg/dL — ABNORMAL HIGH (ref <1.0)

## 2019-11-18 LAB — MAGNESIUM: Magnesium: 1.9 mg/dL (ref 1.7–2.4)

## 2019-11-18 LAB — MRSA PCR SCREENING: MRSA by PCR: NEGATIVE

## 2019-11-18 MED ORDER — LEVALBUTEROL TARTRATE 45 MCG/ACT IN AERO
2.0000 | INHALATION_SPRAY | Freq: Four times a day (QID) | RESPIRATORY_TRACT | Status: DC
  Administered 2019-11-18 – 2019-11-20 (×8): 2 via RESPIRATORY_TRACT
  Filled 2019-11-18: qty 15

## 2019-11-18 MED ORDER — METOPROLOL TARTRATE 25 MG PO TABS
12.5000 mg | ORAL_TABLET | Freq: Two times a day (BID) | ORAL | Status: DC
  Administered 2019-11-18 – 2019-11-19 (×2): 12.5 mg via ORAL
  Filled 2019-11-18 (×4): qty 1

## 2019-11-18 MED ORDER — METOPROLOL TARTRATE 5 MG/5ML IV SOLN
2.5000 mg | INTRAVENOUS | Status: AC | PRN
  Administered 2019-11-18 – 2019-11-19 (×3): 2.5 mg via INTRAVENOUS
  Filled 2019-11-18 (×3): qty 5

## 2019-11-18 MED ORDER — SODIUM CHLORIDE 0.9 % IV SOLN
Freq: Once | INTRAVENOUS | Status: AC
  Administered 2019-11-18: 22:00:00 via INTRAVENOUS

## 2019-11-18 MED ORDER — ALBUTEROL SULFATE HFA 108 (90 BASE) MCG/ACT IN AERS
2.0000 | INHALATION_SPRAY | RESPIRATORY_TRACT | Status: DC | PRN
  Filled 2019-11-18: qty 6.7

## 2019-11-18 MED ORDER — AMIODARONE HCL 100 MG PO TABS: 200.0000 mg | ORAL_TABLET | Freq: Every day | ORAL | Status: DC

## 2019-11-18 MED ORDER — METHYLPREDNISOLONE SODIUM SUCC 40 MG IJ SOLR
40.0000 mg | Freq: Two times a day (BID) | INTRAMUSCULAR | Status: DC
  Administered 2019-11-18 – 2019-11-20 (×5): 40 mg via INTRAVENOUS
  Filled 2019-11-18 (×5): qty 1

## 2019-11-18 MED ORDER — ALBUTEROL SULFATE HFA 108 (90 BASE) MCG/ACT IN AERS
2.0000 | INHALATION_SPRAY | Freq: Four times a day (QID) | RESPIRATORY_TRACT | Status: DC
  Administered 2019-11-18 (×2): 2 via RESPIRATORY_TRACT
  Filled 2019-11-18: qty 6.7

## 2019-11-18 MED ORDER — FUROSEMIDE 10 MG/ML IJ SOLN
20.0000 mg | Freq: Every day | INTRAMUSCULAR | Status: DC
  Administered 2019-11-18: 20 mg via INTRAVENOUS
  Filled 2019-11-18: qty 2

## 2019-11-18 MED ORDER — METOPROLOL TARTRATE 5 MG/5ML IV SOLN: 2.5000 mg | INTRAVENOUS | Status: DC | PRN

## 2019-11-18 NOTE — Progress Notes (Signed)
Pt went into a.fib with RVR up to 140s. MEWS went red and now back yellow. MD aware. Charge Nurse aware. Will monitor closely.

## 2019-11-18 NOTE — Progress Notes (Signed)
  Amiodarone Drug - Drug Interaction Consult Note  Recommendations: No major interactions noted  Amiodarone is metabolized by the cytochrome P450 system and therefore has the potential to cause many drug interactions. Amiodarone has an average plasma half-life of 50 days (range 20 to 100 days).   There is potential for drug interactions to occur several weeks or months after stopping treatment and the onset of drug interactions may be slow after initiating amiodarone.   []  Statins: Increased risk of myopathy. Simvastatin- restrict dose to 20mg  daily. Other statins: counsel patients to report any muscle pain or weakness immediately.  []  Anticoagulants: Amiodarone can increase anticoagulant effect. Consider warfarin dose reduction. Patients should be monitored closely and the dose of anticoagulant altered accordingly, remembering that amiodarone levels take several weeks to stabilize.  []  Antiepileptics: Amiodarone can increase plasma concentration of phenytoin, the dose should be reduced. Note that small changes in phenytoin dose can result in large changes in levels. Monitor patient and counsel on signs of toxicity.  []  Beta blockers: increased risk of bradycardia, AV block and myocardial depression. Sotalol - avoid concomitant use.  []   Calcium channel blockers (diltiazem and verapamil): increased risk of bradycardia, AV block and myocardial depression.  []   Cyclosporine: Amiodarone increases levels of cyclosporine. Reduced dose of cyclosporine is recommended.  []  Digoxin dose should be halved when amiodarone is started.  []  Diuretics: increased risk of cardiotoxicity if hypokalemia occurs.  []  Oral hypoglycemic agents (glyburide, glipizide, glimepiride): increased risk of hypoglycemia. Patient's glucose levels should be monitored closely when initiating amiodarone therapy.   []  Drugs that prolong the QT interval:  Torsades de pointes risk may be increased with concurrent use - avoid if  possible.  Monitor QTc, also keep magnesium/potassium WNL if concurrent therapy can't be avoided. Marland Kitchen Antibiotics: e.g. fluoroquinolones, erythromycin. . Antiarrhythmics: e.g. quinidine, procainamide, disopyramide, sotalol. . Antipsychotics: e.g. phenothiazines, haloperidol.  . Lithium, tricyclic antidepressants, and methadone.  Onnie Boer, PharmD, BCIDP, AAHIVP, CPP Infectious Disease Pharmacist 11/18/2019 1:45 PM

## 2019-11-18 NOTE — Progress Notes (Signed)
PHARMACY - PHYSICIAN COMMUNICATION CRITICAL VALUE ALERT - BLOOD CULTURE IDENTIFICATION (BCID)  Ricardo Erickson is an 78 y.o. male who presented to [name] [place] with a chief complaint of pneumonia due to [place].  Assessment:  1 (aerobic) bottle of 4 bottles positive for GPC in clusters  Name of physician (or Provider) Contacted:  Dr. Myna Hidalgo  Current anti-microbials: remdesivir  Changes to prescribed antibiotics recommended:  vancomycin per pharmacy protocol, unless deemed contaminant   Results for orders placed or performed during the hospital encounter of 07/14/18  Blood Culture ID Panel (Reflexed) (Collected: 07/14/2018  4:47 AM)  Result Value Ref Range   Enterococcus species NOT DETECTED NOT DETECTED   Listeria monocytogenes NOT DETECTED NOT DETECTED   Staphylococcus species DETECTED (A) NOT DETECTED   Staphylococcus aureus (BCID) NOT DETECTED NOT DETECTED   Methicillin resistance DETECTED (A) NOT DETECTED   Streptococcus species NOT DETECTED NOT DETECTED   Streptococcus agalactiae NOT DETECTED NOT DETECTED   Streptococcus pneumoniae NOT DETECTED NOT DETECTED   Streptococcus pyogenes NOT DETECTED NOT DETECTED   Acinetobacter baumannii NOT DETECTED NOT DETECTED   Enterobacteriaceae species NOT DETECTED NOT DETECTED   Enterobacter cloacae complex NOT DETECTED NOT DETECTED   Escherichia coli NOT DETECTED NOT DETECTED   Klebsiella oxytoca NOT DETECTED NOT DETECTED   Klebsiella pneumoniae NOT DETECTED NOT DETECTED   Proteus species NOT DETECTED NOT DETECTED   Serratia marcescens NOT DETECTED NOT DETECTED   Haemophilus influenzae NOT DETECTED NOT DETECTED   Neisseria meningitidis NOT DETECTED NOT DETECTED   Pseudomonas aeruginosa NOT DETECTED NOT DETECTED   Candida albicans NOT DETECTED NOT DETECTED   Candida glabrata NOT DETECTED NOT DETECTED   Candida krusei NOT DETECTED NOT DETECTED   Candida parapsilosis NOT DETECTED NOT DETECTED   Candida tropicalis NOT DETECTED NOT  DETECTED    Despina Pole 11/18/2019  10:48 PM

## 2019-11-18 NOTE — Progress Notes (Signed)
Spoke to MD at bedside for pt tachycardia, EKG complete, PRN metoprolol given, lasix given and inhaler. Pt appetite poor, only eating bites of food.

## 2019-11-18 NOTE — Progress Notes (Addendum)
MEWS score turned red after first set of vitals due to temp, HR, and RR. It was noted during shift report that pt's MEWS had been yellow and red throughout the day due to ongoing AF RVR. Physician notified, VS monitoring increased.  Patient's BP reading at 2100 78/54 (62). BP was rechecked multiple times and on opposite arm with similar readings. Physician was notified and bolus of 217ml ordered.

## 2019-11-18 NOTE — Evaluation (Signed)
Physical Therapy Evaluation Patient Details Name: Ricardo Erickson MRN: [id number] DOB: [birth year] Today's Date: 11/18/2019   History of Present Illness  78 y.o. male with medical history significant of PE, COPD, chronic hypoxic respiratory failure on home oxygen, chronic diastolic congestive heart failure, paroxysmal atrial fibrillation, depression, dementia presenting from [facility] for evaluation of shortness of breath and hypoxia.A. fib with RVR with heart rate 100-130SARS-CoV-2 rapid antigen test positive on 12/3.  Clinical Impression  Pt admitted with above diagnosis.  Pt very willing to work with PT. Deferred OOB to chair d/t pt with HR from 111,elevated to max of 148, extremely tachycardic,  RN present and aware. Pt on 4L HFNC with SpO2=77% to 95%, earlobe monitor likely giving inconsistent reading at times. PT dyspneic and  Requiring tactile cues for trunk extension and correct breathing.   Pt currently with functional limitations due to the deficits listed below (see PT Problem List). Pt will benefit from skilled PT to increase their independence and safety with mobility to allow discharge to the venue listed below.        Follow Up Recommendations SNF    Equipment Recommendations  None recommended by PT    Recommendations for Other Services       Precautions / Restrictions Precautions Precautions: Fall Precaution Comments: monitor VS. Restrictions Weight Bearing Restrictions: No      Mobility  Bed Mobility Overal bed mobility: Needs Assistance Bed Mobility: Rolling;Sidelying to Sit;Sit to Supine Rolling: Mod assist Sidelying to sit: Mod assist;+2 for physical assistance;+2 for safety/equipment   Sit to supine: +2 for physical assistance;+2 for safety/equipment;Max assist   General bed mobility comments: multi-modal cues for task sequencing, assist to elevate and lower trunk, assist to bring LEs on and off bed; bed pad used to assist scooting in  sitting  Transfers                 General transfer comment: NT today as pt tachycardic/elevated HR  Ambulation/Gait                Stairs            Wheelchair Mobility    Modified Rankin (Stroke Patients Only)       Balance Overall balance assessment: Needs assistance Sitting-balance support: Feet supported;Feet unsupported;Bilateral upper extremity supported Sitting balance-Leahy Scale: Poor Sitting balance - Comments: reliant on UEs and intermittent assist to maintain midline; improved with time. sat EOB ~ 15 minutes       Standing balance comment: doesnot stand at baseline                             Pertinent Vitals/Pain Pain Assessment: No/denies pain    Home Living Family/patient expects to be discharged to:: Skilled nursing facility                 Additional Comments: per chart pt is resident at [facility]    Prior Function Level of Independence: Needs assistance   Gait / Transfers Assistance Needed: at baseline pt has assist to EOB (sounds ~ min assist). assist to w/c, pt reports he "scoots". pt states he self propels only minimally           Hand Dominance        Extremity/Trunk Assessment   Upper Extremity Assessment Upper Extremity Assessment: Defer to OT evaluation;Generalized weakness    Lower Extremity Assessment Lower Extremity Assessment: Generalized weakness    Cervical /  Trunk Assessment Cervical / Trunk Assessment: Kyphotic;Other exceptions Cervical / Trunk Exceptions: forward head, unable to lie flat  Communication   Communication: No difficulties  Cognition Arousal/Alertness: Awake/alert Behavior During Therapy: WFL for tasks assessed/performed Overall Cognitive Status: No family/caregiver present to determine baseline cognitive functioning Area of Impairment: Orientation;Following commands;Problem solving                 Orientation Level: Disoriented  to;Place;Situation;Time(intermittently oriented)     Following Commands: Follows one step commands consistently;Follows multi-step commands inconsistently;Follows multi-step commands with increased time     Problem Solving: Slow processing;Difficulty sequencing;Requires verbal cues;Requires tactile cues General Comments: pt intermittently confused, easily re-oriented. follows most basic commands. tangential at times      General Comments      Exercises     Assessment/Plan    PT Assessment Patient needs continued PT services  PT Problem List Decreased strength;Decreased range of motion;Decreased activity tolerance;Decreased balance;Decreased mobility       PT Treatment Interventions Therapeutic exercise;Functional mobility training;Therapeutic activities;Patient/family education;Balance training    PT Goals (Current goals can be found in the Care Plan section)  Acute Rehab PT Goals Patient Stated Goal: back to [facility],  feel better PT Goal Formulation: With patient Time For Goal Achievement: 12/02/19 Potential to Achieve Goals: Fair    Frequency Min 2X/week   Barriers to discharge        Co-evaluation               AM-PAC PT "6 Clicks" Mobility  Outcome Measure Help needed turning from your back to your side while in a flat bed without using bedrails?: A Lot Help needed moving from lying on your back to sitting on the side of a flat bed without using bedrails?: A Lot Help needed moving to and from a bed to a chair (including a wheelchair)?: A Lot Help needed standing up from a chair using your arms (e.g., wheelchair or bedside chair)?: Total Help needed to walk in hospital room?: Total Help needed climbing 3-5 steps with a railing? : Total 6 Click Score: 9    End of Session   Activity Tolerance: Treatment limited secondary to medical complications (Comment)(incr HR/tachy) Patient left: in bed;with call bell/phone within reach;with bed alarm set;with  nursing/sitter in room Nurse Communication: Mobility status PT Visit Diagnosis: Muscle weakness (generalized) (M62.81);Other abnormalities of gait and mobility (R26.89)    Time: [id number] PT Time Calculation (min) (ACUTE ONLY): 31 min   Charges:   PT Evaluation $PT Eval Moderate Complexity: 1 Mod PT Treatments $Therapeutic Activity: 8-22 mins        Kenyon Ana, PT  Pager: [phone] Acute Rehab Dept [facility]): [phone]   11/18/2019   [facility] 11/18/2019, 3:08 PM

## 2019-11-18 NOTE — Progress Notes (Signed)
PROGRESS NOTE    ALARIK HYPOLITE  [id number] DOB: 09/09/1941 DOA: 11/28/2019 PCP: Isaias Cowman   Brief Narrative:  ICHOLAS WEARE is Ziomara Birenbaum 78 y.o. male with medical history significant of PE, COPD, chronic hypoxic respiratory failure on home oxygen, chronic diastolic congestive heart failure, paroxysmal atrial fibrillation, depression, dementia presenting from [facility] for evaluation of shortness of breath and hypoxia.  Per EMS, patient was satting 85% on his baseline 4 L oxygen via nasal cannula.  He was placed on 15 L oxygen via nonrebreather and sats improved to 95%. Found to be in Dangela How. fib with RVR with heart rate 100-130.  Patient received Joeline Freer gram of Tylenol at his facility prior to ED arrival.  SARS-CoV-2 rapid antigen test positive on 12/3.  No history could be obtained from the patient and he is currently in respiratory distress and requiring nonrebreather.  ED provider Dr. Darl Householder spoke with the patient's son who confirmed that the patient is DNR/DNI.  ED Course: Repeat SARS-CoV-2 antigen test positive.  Upon arrival to the ED, febrile with temperature of 103.9 F.  Miliano Cotten. fib with RVR with heart rate in the 140s.  Significantly tachypneic with respiratory rate in the 30s.  ABG with pH 7.41, PCO2 45, and PO2 161.  Blood pressure low with systolic in the 0000000.  No leukocytosis.  Lactic acid 2.0.  LFTs normal.  Inflammatory markers elevated: CRP 14.0, D-dimer 0.99.  High-sensitivity troponin negative.  BNP elevated at 408.  Blood culture x2 pending. Chest x-ray showing cardiomegaly with prominent interstitial lung markings concerning for pulmonary edema.  Persistent but slightly improved bibasilar airspace opacities concerning for pneumonia. Pending labs: Procalcitonin Patient received Decadron 10 mg, Elan Mcelvain 500 cc normal saline bolus, and was started on amiodarone bolus plus infusion. Critical care and cardiology consulted.  They recommended starting patient on amiodarone infusion.  Assessment & Plan:    Principal Problem:   Pneumonia due to COVID-19 virus Active Problems:   Pulmonary emboli (HCC)   CHF exacerbation (HCC)   Acute on chronic respiratory failure with hypoxia (HCC)   Atrial fibrillation with rapid ventricular response (HCC)  Acute on chronic hypoxic respiratory failure secondary to COVID-19 viral pneumonia On presentation was requiring 15 L with NRB Currently improved, on 3 L when I was at bedside Continue steroids and remdesivr  -Vitamin C and zinc -Antitussives as needed - Daily inflammatory labs -Airborne and contact precautions -Continuous pulse ox -Keep oxygen saturation above 88% given underlying COPD -Blood culture x2 pending - I/O, daily weights - lasix as tolerated - repeat echo with EF 50-55%, difficult study (see report)  COVID-19 Labs  Recent Labs    12/06/2019 1753 12/09/2019 1804 11/18/19 0350 11/18/19 0830  DDIMER  --  0.99* 0.96*  --   FERRITIN 339*  --   --  317  LDH 177  --  192  --   CRP 14.0*  --   --  18.1*    Lab Results  Component Value Date   SARSCOV2NAA NEGATIVE 07/18/2019   SARSCOV2NAA NOT DETECTED 04/15/2019    Concern for Heart Failure with Preserved EF on Presentation:  Elevated BNP on presentation Pt appears relatively euvolemic at this point on my exam Continue I/O, daily weights With COVID 19 infection, will attempt to keep him on the dryer side Takes 20 mg PO lasix at home, will try 20 IV daily and follow  Amrita Radu. fib with RVR Patient found to be in Madden Garron. fib with RVR with heart rate up  to 140s on arrival to ED.  Likely precipitated by hypoxia secondary to COVID-19 viral pneumonia and volume overload from acute on chronic diastolic CHF.  He was started on amiodarone bolus plus infusion per cardiology recommendation.  Rate currently ranging from low 100s to 120s. CHA2DS2VASc at least 3.  -Continue amiodarone infusion -> transition to home PO -Continue home Eliquis for anticoagulation - repeat EKG  History of PE -Continue  home Eliquis for anticoagulation  COPD -Transition home inhaler therapy to Dulera 200-50 mcg 2 puffs twice daily and Incruse Ellipta 62.5 mg -xopenex, atrovent  BPH -Continue home finasteride and tamsulosin  QT prolongation -Cardiac monitoring -Keep potassium above 4 and magnesium above 2 -Takes Zyprexa at home. Hold at this time.  Avoid QT prolonging drugs if possible. -Repeat EKG in [place] H[place]ant.m.  Dementia: delirium precautions  DVT prophylaxis: eliquis Code Status: dnr Family Communication: son over phone 12/7 Disposition Plan: pending further improvement  Consultants:   PCCM  Procedures:  Echo IMPRESSIONS    1. Very limited study--difficult images in all attempred echo windows. Grossly normal LVEF without significant valve disease.  2. Left ventricular ejection fraction, by visual estimation, is 50 to 55%. The left ventricle has low normal function. There is no left ventricular hypertrophy.  3. Global right ventricle was not well visualized.The right ventricular size is not well visualized. Right vetricular wall thickness was not assessed.  4. Left atrial size was not well visualized.  5. Right atrial size was not well visualized.  6. Trivial pericardial effusion is present.  7. The mitral valve was not well visualized. No evidence of mitral valve regurgitation. No evidence of mitral stenosis.  8. The tricuspid valve is not well visualized. Tricuspid valve regurgitation is not demonstrated.  9. The aortic valve was not well visualized. Aortic valve regurgitation is not visualized. No evidence of aortic valve sclerosis or stenosis. 10. The pulmonic valve was not well visualized. Pulmonic valve regurgitation is not visualized. 11. Aortic dilatation noted. 12. 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 13. The interatrial septum was not well visualized. 14. Left ventricular diastolic function could not be evaluated.  Antimicrobials:   Anti-infectives (From admission, onward)   Start     Dose/Rate Route Frequency Ordered Stop   11/18/19 1000  remdesivir 100 mg in sodium chloride 0.9 % 100 mL IVPB     100 mg 200 mL/hr over 30 Minutes Intravenous Daily 11/12/2019 2105 11/22/19 0959   11/27/2019 2200  remdesivir 200 mg in sodium chloride 0.9% 250 mL IVPB     200 mg 580 mL/hr over 30 Minutes Intravenous Once 11/19/2019 2105 11/18/19 0043     Subjective: C/o SOB No CP [name] Cart&Ox3  Objective: Vitals:   11/18/19 1500 11/18/19 1505 11/18/19 1534 11/18/19 1600  BP: 111/87 111/87  110/84  Pulse: 80 (!) 106 (!) 105 (!) 107  Resp: (!) 30 (!) 21  (!) 27  Temp:    98.6 F (37 C)  TempSrc:  Oral  Oral  SpO2: 94% 94%  94%  Height:        Intake/Output Summary (Last 24 hours) at 11/18/2019 1626 Last data filed at 11/18/2019 1617 Gross per 24 hour  Intake 1853.51 ml  Output 650 ml  Net 1203.51 ml   There were no vitals filed for this visit.  Examination:  General exam: Appears calm and comfortable  Respiratory system: unlabored, appears comfortable, weaned to 3 L [place] and maintained saturations Cardiovascular system: mildly tachycardic, irregular Gastrointestinal system:  Abdomen is nondistended, soft and nontender.  Central nervous system: Alert and oriented. No focal neurological deficits. Extremities: no LEE Skin: No rashes, lesions or ulcers Psychiatry: Judgement and insight appear normal. Mood & affect appropriate.     Data Reviewed: I have personally reviewed following labs and imaging studies  CBC: Recent Labs  Lab 11/14/19 1709 11/12/2019 1753 12/04/2019 1816 11/29/2019 2238 11/18/19 0350  WBC 6.9 9.1  --   --  10.1  NEUTROABS 4.3 7.9*  --   --  9.0*  HGB 10.6* 13.3 12.9* 15.0 13.3  HCT 35.9* 42.1 38.0* 44.0 44.2  MCV 93.7 89.4  --   --  91.3  PLT 221 215  --   --  [id number]   Basic Metabolic Panel: Recent Labs  Lab 11/14/19 1709 11/24/2019 1753 11/12/2019 1816 12/06/2019 2238 11/18/19 0350  NA 142 137 139 139 144   K 3.7 3.6 3.4* 3.5 4.3  CL 107 105  --   --  103  CO2 27 24  --   --  29  GLUCOSE 86 146*  --   --  127*  BUN 15 18  --   --  18  CREATININE 0.99 1.02  --   --  0.79  CALCIUM 7.9* 8.0*  --   --  8.4*  MG  --   --   --   --  1.9   GFR: CrCl cannot be calculated (Unknown ideal weight.). Liver Function Tests: Recent Labs  Lab 11/23/2019 1753 11/18/19 0350  AST 16 16  ALT 7 7  ALKPHOS 91 89  BILITOT 0.8 0.9  PROT 5.5* 6.0*  ALBUMIN 2.3* 2.7*   No results for input(s): LIPASE, AMYLASE in the last 168 hours. No results for input(s): AMMONIA in the last 168 hours. Coagulation Profile: No results for input(s): INR, PROTIME in the last 168 hours. Cardiac Enzymes: No results for input(s): CKTOTAL, CKMB, CKMBINDEX, TROPONINI in the last 168 hours. BNP (last 3 results) No results for input(s): PROBNP in the last 8760 hours. HbA1C: No results for input(s): HGBA1C in the last 72 hours. CBG: No results for input(s): GLUCAP in the last 168 hours. Lipid Profile: Recent Labs    11/13/2019 1753  TRIG 24   Thyroid Function Tests: No results for input(s): TSH, T4TOTAL, FREET4, T3FREE, THYROIDAB in the last 72 hours. Anemia Panel: Recent Labs    11/21/2019 1753 11/18/19 0830  FERRITIN 339* 317   Sepsis Labs: Recent Labs  Lab 11/24/2019 1753 11/14/2019 1930  PROCALCITON 0.10  --   LATICACIDVEN 2.0* 1.5    Recent Results (from the past 240 hour(s))  Blood Culture (routine x 2)     Status: None (Preliminary result)   Collection Time: 12/12/2019  6:04 PM   Specimen: BLOOD  Result Value Ref Range Status   Specimen Description BLOOD RIGHT HAND  Final   Special Requests   Final    BOTTLES DRAWN AEROBIC AND ANAEROBIC Blood Culture results may not be optimal due to an inadequate volume of blood received in culture bottles   Culture   Final    NO GROWTH < 24 HOURS Performed at [facility], [place] [address], [place], [place] [address]    Report Status PENDING  Incomplete  Blood  Culture (routine x 2)     Status: None (Preliminary result)   Collection Time: 11/12/2019  7:00 PM   Specimen: BLOOD  Result Value Ref Range Status   Specimen Description BLOOD LEFT WRIST  Final   Special Requests   Final    BOTTLES DRAWN AEROBIC AND ANAEROBIC Blood Culture adequate volume   Culture   Final    NO GROWTH < 24 HOURS Performed at [facility], [address], [place], [place] [address]    Report Status PENDING  Incomplete  MRSA PCR Screening     Status: None   Collection Time: 11/18/19 12:31 AM   Specimen: Nasal Mucosa; Nasopharyngeal  Result Value Ref Range Status   MRSA by PCR NEGATIVE NEGATIVE Final    Comment:        The GeneXpert MRSA Assay (FDA approved for NASAL specimens only), is one component of [place] comprehensive MRSA colonization surveillance program. It is not intended to diagnose MRSA infection nor to guide or monitor treatment for MRSA infections. Performed at [facility], [place] [address] [place] [address] [place], [place] [address]          Radiology Studies: Dg Chest Port 1 View  Result Date: 11/18/2019 CLINICAL DATA:  Hypoxia EXAM: PORTABLE CHEST 1 VIEW COMPARISON:  11/24/2019 FINDINGS: Diffuse bilateral airspace disease with progression in the upper lobes. Improvement in bibasilar airspace disease. No definite pleural effusion. Elevated left hemidiaphragm. Cardiac enlargement. Atherosclerotic aortic arch IMPRESSION: Progression of upper lobe airspace disease bilaterally may represent pneumonia or edema. Improvement in bibasilar airspace disease most likely due to atelectasis. Electronically Signed   By: Franchot Gallo M.D.   On: 11/18/2019 11:27   Dg Chest Port 1 View  Result Date: 11/21/2019 CLINICAL DATA:  Shortness of breath. EXAM: PORTABLE CHEST 1 VIEW COMPARISON:  11/14/2019 FINDINGS: There is cardiomegaly with evidence for developing pulmonary edema. Bibasilar airspace opacities are again noted. There is elevation of the left  hemidiaphragm. There is no pneumothorax. There may be Caron Tardif left-sided pleural effusion. There is no acute osseous abnormality. IMPRESSION: 1. Cardiomegaly with prominent interstitial lung markings may represent pulmonary edema. 2. Persistent but slightly improved bibasilar airspace opacities which may represent atelectasis or pneumonia. Electronically Signed   By: Constance Holster M.D.   On: 11/16/2019 19:06        Scheduled Meds: . albuterol  2 puff Inhalation Q6H  . apixaban  5 mg Oral BID  . docusate sodium  100 mg Oral BID  . finasteride  5 mg Oral Daily  . FLUoxetine  10 mg Oral Daily  . guaiFENesin  600 mg Oral BID  . loratadine  10 mg Oral Daily  . methylPREDNISolone (SOLU-MEDROL) injection  40 mg Intravenous Q12H  . mometasone-formoterol  2 puff Inhalation BID  . pramipexole  0.5 mg Oral QHS  . tamsulosin  0.8 mg Oral QPC supper  . umeclidinium bromide  1 puff Inhalation Daily  . vitamin C  500 mg Oral Daily  . zinc sulfate  220 mg Oral Daily   Continuous Infusions: . amiodarone 30 mg/hr (11/18/19 1441)  . remdesivir 100 mg in NS 100 mL 100 mg (11/18/19 1020)     LOS: 1 day    Time spent: over 23 min    Fayrene Helper, MD [facility] Pager AMION  If 7PM-7AM, please contact night-coverage [email or web address] Password [facility] 11/18/2019, 4:26 PM

## 2019-11-18 NOTE — TOC Progression Note (Addendum)
Transition of Care [facility]) - Progression Note    Patient Details  Name: Ricardo Erickson MRN: [id number] Date of Birth: 1941/04/16  Transition of Care [facility]) CM/SW Contact  Joaquin Courts, RN Phone Number: 11/18/2019, 12:32 PM  Clinical Narrative:    Patient coming from [facility], long term resident. Will need FL2 and auth to return per facility rep.     Barriers to Discharge: Continued Medical Work up  Expected Discharge Plan and Services                                                 Social Determinants of Health (SDOH) Interventions    Readmission Risk Interventions Readmission Risk Prevention Plan 04/17/2019  Transportation Screening Complete  PCP or Specialist Appt within 3-5 Days Complete  HRI or [place] Complete  Social Work Consult for [place] Planning/Counseling Complete  Palliative Care Screening Not Applicable  Medication Review Press photographer) Complete  Some recent data might be hidden

## 2019-11-18 NOTE — Progress Notes (Signed)
  Echocardiogram 2D Echocardiogram has been performed.  Jennette Dubin 11/18/2019, 10:28 AM

## 2019-11-18 NOTE — Plan of Care (Signed)
  Problem: Education: Goal: Knowledge of risk factors and measures for prevention of condition will improve Outcome: Progressing   Problem: Coping: Goal: Psychosocial and spiritual needs will be supported Outcome: Progressing   Problem: Respiratory: Goal: Will maintain a patent airway Outcome: Progressing Goal: Complications related to the disease process, condition or treatment will be avoided or minimized Outcome: Progressing   Problem: Education: Goal: Knowledge of General Education information will improve Description: Including pain rating scale, medication(s)/side effects and non-pharmacologic comfort measures Outcome: Progressing   Problem: Health Behavior/Discharge Planning: Goal: Ability to manage health-related needs will improve Outcome: Progressing   Problem: Coping: Goal: Level of anxiety will decrease Outcome: Progressing   Problem: Safety: Goal: Ability to remain free from injury will improve Outcome: Progressing   Problem: Skin Integrity: Goal: Risk for impaired skin integrity will decrease Outcome: Progressing

## 2019-11-18 NOTE — Progress Notes (Signed)
   Vital Signs MEWS/VS Documentation      11/18/2019 1400 11/18/2019 1407 11/18/2019 1500 11/18/2019 1505   MEWS Score:  2  5  4  3    MEWS Score Color:  Yellow  Red  Red  Yellow   Resp:  (!) 29  -  (!) 30  (!) 21   Pulse:  74  -  80  (!) 106   BP:  104/70  -  111/87  111/87   O2 Device:  -  -  -  HFNC   O2 Flow Rate (L/min):  -  -  -  4 L/min   Level of Consciousness:  -  -  -  Alert     Changes in HR. MD aware. Will continue to monitor.       Ricardo Erickson 11/18/2019,3:08 PM

## 2019-11-18 NOTE — Discharge Instructions (Signed)

## 2019-11-19 ENCOUNTER — Inpatient Hospital Stay (HOSPITAL_COMMUNITY): Payer: Medicare Other

## 2019-11-19 LAB — URINALYSIS, ROUTINE W REFLEX MICROSCOPIC

## 2019-11-19 LAB — CULTURE, BLOOD (ROUTINE X 2): Special Requests: ADEQUATE

## 2019-11-19 LAB — POCT I-STAT EG7
Acid-Base Excess: 2 mmol/L (ref 0.0–2.0)
Bicarbonate: 27.8 mmol/L (ref 20.0–28.0)
Calcium, Ion: 1.15 mmol/L (ref 1.15–1.40)
HCT: 45 % (ref 39.0–52.0)
Hemoglobin: 15.3 g/dL (ref 13.0–17.0)
O2 Saturation: 28 %
Patient temperature: 97.7
Potassium: 3.9 mmol/L (ref 3.5–5.1)
Sodium: 140 mmol/L (ref 135–145)
TCO2: 29 mmol/L (ref 22–32)
pCO2, Ven: 47.9 mmHg (ref 44.0–60.0)
pH, Ven: 7.37 (ref 7.250–7.430)
pO2, Ven: 19 mmHg — CL (ref 32.0–45.0)

## 2019-11-19 LAB — HEMOGLOBIN AND HEMATOCRIT, BLOOD
HCT: 45.2 % (ref 39.0–52.0)
Hemoglobin: 13.8 g/dL (ref 13.0–17.0)

## 2019-11-19 LAB — C-REACTIVE PROTEIN: CRP: 17.8 mg/dL — ABNORMAL HIGH (ref <1.0)

## 2019-11-19 LAB — COMPREHENSIVE METABOLIC PANEL
ALT: 7 U/L (ref 0–44)
AST: 19 U/L (ref 15–41)
Albumin: 2.6 g/dL — ABNORMAL LOW (ref 3.5–5.0)
Alkaline Phosphatase: 85 U/L (ref 38–126)
Anion gap: 14 (ref 5–15)
BUN: 27 mg/dL — ABNORMAL HIGH (ref 8–23)
CO2: 27 mmol/L (ref 22–32)
Calcium: 8.2 mg/dL — ABNORMAL LOW (ref 8.9–10.3)
Chloride: 99 mmol/L (ref 98–111)
Creatinine, Ser: 0.85 mg/dL (ref 0.61–1.24)
GFR calc Af Amer: >60 mL/min (ref >60)
GFR calc non Af Amer: >60 mL/min (ref >60)
Glucose, Bld: 107 mg/dL — ABNORMAL HIGH (ref 70–99)
Potassium: 4.2 mmol/L (ref 3.5–5.1)
Sodium: 140 mmol/L (ref 135–145)
Total Bilirubin: 0.7 mg/dL (ref 0.3–1.2)
Total Protein: 6.1 g/dL — ABNORMAL LOW (ref 6.5–8.1)

## 2019-11-19 LAB — CBC WITH DIFFERENTIAL/PLATELET
Abs Immature Granulocytes: 0.15 10*3/uL — ABNORMAL HIGH (ref 0.00–0.07)
Basophils Absolute: 0 10*3/uL (ref 0.0–0.1)
Basophils Relative: 0 %
Eosinophils Absolute: 0 10*3/uL (ref 0.0–0.5)
Eosinophils Relative: 0 %
HCT: 43.8 % (ref 39.0–52.0)
Hemoglobin: 13.2 g/dL (ref 13.0–17.0)
Immature Granulocytes: 1 %
Lymphocytes Relative: 4 %
Lymphs Abs: 0.7 10*3/uL (ref 0.7–4.0)
MCH: 27.6 pg (ref 26.0–34.0)
MCHC: 30.1 g/dL (ref 30.0–36.0)
MCV: 91.6 fL (ref 80.0–100.0)
Monocytes Absolute: 1 10*3/uL (ref 0.1–1.0)
Monocytes Relative: 5 %
Neutro Abs: 18.1 10*3/uL — ABNORMAL HIGH (ref 1.7–7.7)
Neutrophils Relative %: 90 %
Platelets: 180 10*3/uL (ref 150–400)
RBC: 4.78 MIL/uL (ref 4.22–5.81)
RDW: 14.4 % (ref 11.5–15.5)
WBC: 20 10*3/uL — ABNORMAL HIGH (ref 4.0–10.5)
nRBC: 0 % (ref 0.0–0.2)

## 2019-11-19 LAB — BRAIN NATRIURETIC PEPTIDE: B Natriuretic Peptide: 329.5 pg/mL — ABNORMAL HIGH (ref 0.0–100.0)

## 2019-11-19 LAB — LACTIC ACID, PLASMA
Lactic Acid, Venous: 3.5 mmol/L (ref 0.5–1.9)
Lactic Acid, Venous: 4.7 mmol/L (ref 0.5–1.9)
Lactic Acid, Venous: 5 mmol/L (ref 0.5–1.9)

## 2019-11-19 LAB — URINALYSIS, MICROSCOPIC (REFLEX): RBC / HPF: >50 RBC/hpf (ref 0–5)

## 2019-11-19 LAB — PROCALCITONIN: Procalcitonin: 0.22 ng/mL

## 2019-11-19 LAB — D-DIMER, QUANTITATIVE: D-Dimer, Quant: 1.01 ug/mL-FEU — ABNORMAL HIGH (ref 0.00–0.50)

## 2019-11-19 LAB — FERRITIN: Ferritin: 489 ng/mL — ABNORMAL HIGH (ref 24–336)

## 2019-11-19 LAB — MAGNESIUM: Magnesium: 1.8 mg/dL (ref 1.7–2.4)

## 2019-11-19 MED ORDER — LACTATED RINGERS IV BOLUS
500.0000 mL | Freq: Once | INTRAVENOUS | Status: AC
  Administered 2019-11-19: 500 mL via INTRAVENOUS

## 2019-11-19 MED ORDER — SODIUM CHLORIDE 0.9 % IV SOLN
2.0000 g | INTRAVENOUS | Status: DC
  Administered 2019-11-19 – 2019-11-20 (×2): 2 g via INTRAVENOUS
  Filled 2019-11-19 (×2): qty 20

## 2019-11-19 MED ORDER — TOCILIZUMAB 400 MG/20ML IV SOLN
600.0000 mg | Freq: Once | INTRAVENOUS | Status: AC
  Administered 2019-11-19: 23:00:00 600 mg via INTRAVENOUS
  Filled 2019-11-19: qty 10

## 2019-11-19 MED ORDER — METOPROLOL TARTRATE 5 MG/5ML IV SOLN
5.0000 mg | INTRAVENOUS | Status: DC | PRN
  Administered 2019-11-19: 5 mg via INTRAVENOUS
  Filled 2019-11-19: qty 5

## 2019-11-19 MED ORDER — METOPROLOL TARTRATE 5 MG/5ML IV SOLN
5.0000 mg | Freq: Four times a day (QID) | INTRAVENOUS | Status: DC | PRN
  Filled 2019-11-19: qty 5

## 2019-11-19 MED ORDER — LORAZEPAM 2 MG/ML IJ SOLN: 0.2500 mg | Freq: Four times a day (QID) | INTRAMUSCULAR | Status: DC | PRN

## 2019-11-19 MED ORDER — SODIUM CHLORIDE 0.9% IV SOLUTION: Freq: Once | INTRAVENOUS | Status: DC

## 2019-11-19 MED ORDER — FUROSEMIDE 10 MG/ML IJ SOLN: 20.0000 mg | Freq: Two times a day (BID) | INTRAMUSCULAR | Status: DC

## 2019-11-19 MED ORDER — MORPHINE SULFATE (PF) 2 MG/ML IV SOLN
0.5000 mg | INTRAVENOUS | Status: DC | PRN
  Administered 2019-11-20: 0.5 mg via INTRAVENOUS
  Filled 2019-11-19: qty 1

## 2019-11-19 MED ORDER — DOXYCYCLINE HYCLATE 100 MG PO TABS
100.0000 mg | ORAL_TABLET | Freq: Two times a day (BID) | ORAL | Status: DC
  Administered 2019-11-19 – 2019-11-20 (×3): 100 mg via ORAL
  Filled 2019-11-19 (×4): qty 1

## 2019-11-19 NOTE — Evaluation (Signed)
Occupational Therapy Evaluation Patient Details Name: Ricardo Erickson MRN: [id number] DOB: [birth year] Today's Date: 11/19/2019    History of Present Illness 78 y.o. male with medical history significant of PE, COPD, chronic hypoxic respiratory failure on home oxygen, chronic diastolic congestive heart failure, paroxysmal atrial fibrillation, depression, dementia presenting from [facility] for evaluation of shortness of breath and hypoxia.A. fib with RVR with heart rate 100-130SARS-CoV-2 rapid antigen test positive on 12/3.   Clinical Impression   PTA, pt was living at [facility] and assume pt required assistance for ADLs; per chart pt using w/c for mobility. Pt currently requiring Max-Total A for ADLs and is significantly limited by elevated HR, RR, and SOB. Providing pt with education on flutter valve and he performed 10 reps at bed level; RR dropping from 30-40s to 16 and SpO2 elevated to the 90s from ~86% on 15L via HFNC. Pt agreeable to BUE exercises through dancing to "beach music". Pt would benefit from further acute OT to facilitate safe dc. Recommend dc to SNF for further OT to optimize safety, independence with ADLs, and return to PLOF.      Follow Up Recommendations  SNF;Supervision/Assistance - 24 hour    Equipment Recommendations  Other (comment)(Defer to next venue)    Recommendations for Other Services PT consult     Precautions / Restrictions Precautions Precautions: Fall Precaution Comments: monitor VS. Restrictions Weight Bearing Restrictions: No      Mobility Bed Mobility Overal bed mobility: Needs Assistance             General bed mobility comments: Total A for repositioning in bed to optimize upright posture. Defered trnasfer ot EOB due to vitals  Transfers                      Balance                                           ADL either performed or assessed with clinical judgement   ADL Overall ADL's : Needs  assistance/impaired                                       General ADL Comments: Max-Total A for ADLs. Pt currently presenting with poor activity tolerance with HR elevating to 130-140s and RR in the 40s with bed level activity.     Vision         Perception     Praxis      Pertinent Vitals/Pain Pain Assessment: Faces Faces Pain Scale: Hurts a little bit Pain Location: Generalized Pain Descriptors / Indicators: Discomfort Pain Intervention(s): Monitored during session     Hand Dominance Right   Extremity/Trunk Assessment Upper Extremity Assessment Upper Extremity Assessment: Generalized weakness   Lower Extremity Assessment Lower Extremity Assessment: Defer to PT evaluation   Cervical / Trunk Assessment Cervical / Trunk Assessment: Kyphotic;Other exceptions Cervical / Trunk Exceptions: forward head/neck   Communication Communication Communication: No difficulties   Cognition Arousal/Alertness: Awake/alert Behavior During Therapy: WFL for tasks assessed/performed Overall Cognitive Status: History of cognitive impairments - at baseline                                 General Comments: Baseline dementia.  Pt stating he "feels God has given him a mountain he can't climb." Pt enjoying engaging in music and following simple commands to peform flutter valve exercises   General Comments  HR at rest in bed in 120-130s. RR 30s. SpO2 93-85% on 15L via HFNC. HR elevating to 130-140s and RR 30-40s with bed level activity.    Exercises Exercises: Other exercises Other Exercises Other Exercises: Educated pt on use of flutter valve. Pt performing 10 reps. RR dropping to 10-20s and SpO2 rising to 90s on 15L.  Other Exercises: Pt engaging in BUE exercises with music simulating dance movements.   Shoulder Instructions      Home Living Family/patient expects to be discharged to:: Skilled nursing facility                                  Additional Comments: per chart pt is resident at [facility]      Prior Functioning/Environment Level of Independence: Needs assistance  Gait / Transfers Assistance Needed: at baseline pt has assist to EOB (sounds ~ min assist). assist to w/c, pt reports he "scoots". pt states he self propels only minimally ADL's / Homemaking Assistance Needed: Assume assist with ADLS at [facility]            OT Problem List: Decreased strength;Decreased range of motion;Decreased activity tolerance;Impaired balance (sitting and/or standing);Decreased safety awareness;Decreased knowledge of use of DME or AE;Decreased knowledge of precautions;Decreased cognition      OT Treatment/Interventions: Self-care/ADL training;Therapeutic exercise;Energy conservation;DME and/or AE instruction;Therapeutic activities;Patient/family education    OT Goals(Current goals can be found in the care plan section) Acute Rehab OT Goals Patient Stated Goal: Feel better OT Goal Formulation: With patient Time For Goal Achievement: 12/03/19 Potential to Achieve Goals: Good  OT Frequency: Min 2X/week   Barriers to D/C:            Co-evaluation              AM-PAC OT "6 Clicks" Daily Activity     Outcome Measure Help from another person eating meals?: A Lot Help from another person taking care of personal grooming?: A Lot Help from another person toileting, which includes using toliet, bedpan, or urinal?: Total Help from another person bathing (including washing, rinsing, drying)?: A Lot Help from another person to put on and taking off regular upper body clothing?: A Lot Help from another person to put on and taking off regular lower body clothing?: Total 6 Click Score: 10   End of Session Equipment Utilized During Treatment: Oxygen(15L) Nurse Communication: Mobility status  Activity Tolerance: Patient limited by fatigue;Other (comment)(Limited by vitals (HR and RR)) Patient left: in bed;with call  bell/phone within reach;with bed alarm set  OT Visit Diagnosis: Unsteadiness on feet (R26.81);Other abnormalities of gait and mobility (R26.89);Muscle weakness (generalized) (M62.81)                Time: [id number] OT Time Calculation (min): 37 min Charges:  OT General Charges $OT Visit: 1 Visit OT Evaluation $OT Eval Moderate Complexity: 1 Mod OT Treatments $Self Care/Home Management : 8-22 mins  Raschelle Wisenbaker MSOT, OTR/L Acute Rehab Pager: [phone] Office: [place] 11/19/2019, 5:06 PM

## 2019-11-19 NOTE — TOC Progression Note (Signed)
Transition of Care [facility]) - Progression Note    Patient Details  Name: Ricardo Erickson MRN: [id number] Date of Birth: 03-Nov-1941  Transition of Care [facility]) CM/SW Contact  Joaquin Courts, RN Phone Number: 11/19/2019, 10:41 AM  Clinical Narrative:    Phoebe Perch completed and faxed to [facility]. Facility will need to obtain auth once patient is approaching medical stability for dc.     Expected Discharge Plan: Skilled Nursing Facility Barriers to Discharge: Continued Medical Work up  Expected Discharge Plan and Services Expected Discharge Plan: [place]   Discharge Planning Services: CM Consult Post Acute Care Choice: Resumption of Svcs/PTA Provider Living arrangements for the past 2 months: [place]                 DME Arranged: N/A DME Agency: NA       HH Arranged: NA HH Agency: NA         Social Determinants of Health (SDOH) Interventions    Readmission Risk Interventions Readmission Risk Prevention Plan 04/17/2019  Transportation Screening Complete  PCP or Specialist Appt within 3-5 Days Complete  HRI or Home Care Consult Complete  Social Work Consult for [place] Planning/Counseling Complete  Palliative Care Screening Not Applicable  Medication Review Press photographer) Complete  Some recent data might be hidden

## 2019-11-19 NOTE — Progress Notes (Addendum)
PROGRESS NOTE    Ricardo Erickson  [id number] DOB: 1941-12-09 DOA: 11/14/2019 PCP: Ricardo Erickson   Brief Narrative:  Ricardo Erickson is Ricardo Erickson 78 y.o. male with medical history significant of PE, COPD, chronic hypoxic respiratory failure on home oxygen, chronic diastolic congestive heart failure, paroxysmal atrial fibrillation, depression, dementia presenting from [facility] for evaluation of shortness of breath and hypoxia.  Per EMS, patient was satting 85% on his baseline 4 L oxygen via nasal cannula.  He was placed on 15 L oxygen via nonrebreather and sats improved to 95%. Found to be in Ricardo Erickson. fib with RVR with heart rate 100-130.  Patient received Ricardo Erickson gram of Tylenol at his facility prior to ED arrival.  SARS-CoV-2 rapid antigen test positive on 12/3.  No history could be obtained from the patient and he is currently in respiratory distress and requiring nonrebreather.  ED provider Dr. Darl Erickson spoke with the patient's son who confirmed that the patient is DNR/DNI.  Admitted for COVID 19 pneumonia.  Currently on steroids, remdesivir, antibiotics.  S/p plasma.  O2 needs worsened from 12/7 to today.  Borderline pressures and atrial fibrillation with RVR in the 90's to 110's, currently on amiodarone and metop with holding parameters for BP.  Assessment & Plan:   Principal Problem:   Pneumonia due to COVID-19 virus Active Problems:   Pulmonary emboli (HCC)   CHF exacerbation (HCC)   Acute on chronic respiratory failure with hypoxia (HCC)   Atrial fibrillation with rapid ventricular response (HCC)  Goals of care: discussed with son today.  Discussed worsening status of father.  We discussed plasma.  Son concerned with fathers overall quality of life.  Wants to continue current care plan if this can be maintained, but does not want to prolong suffering if things were futile.  Discussed this was very reasonable desire.  At this point in time, I think it's reasonable to continue plan of care as noted below.   Will also consult palliative care to follow and assist with additional discussions moving forward.  Acute on chronic hypoxic respiratory failure secondary to COVID-19 viral pneumonia Worsening O2 requirement today Continue steroids and remdesivr  Discussed plasma, risk/benefits Addendum: Discussed actemra, risks, benefits, lack of quality evidence, off label use with son.  He was agreeable after discussion.  His procalcitonin is 0.22, suggests against infection.  No hx TB or hepatitis.  1/2 blood cx with coag negative staph is noted, but thought to be contaminant.    Fevers, 12/8 101.1 Ceftriaxone/doxycycline  Vitamin C and zinc Prone if tolerated Daily inflammatory labs I/O, daily weights - lasix as tolerated (on hold today with soft BP)  COVID-19 Labs  Recent Labs    11/18/2019 1753 12/12/2019 1804 11/18/19 0350 11/18/19 0830 11/19/19 0500  DDIMER  --  0.99* 0.96*  --  1.01*  FERRITIN 339*  --   --  317 489*  LDH 177  --  192  --   --   CRP 14.0*  --   --  18.1* 17.8*    Lab Results  Component Value Date   SARSCOV2NAA NEGATIVE 07/18/2019   SARSCOV2NAA NOT DETECTED 04/15/2019   Hypotension   Elevated Lactate: stable mentation, BP fluctuating.  Most recent pressure with MAP >65.  Lasix currently on hold and given small bolus.  Lactate worsened, but will hold off on additional IVF at this time given respiratory status.  Will follow Ricardo Erickson clinically, bicarb on BMP is appropriate.  Will continue to monitor and reassess.  Addendum: lactate rose despite 500 cc bolus.  Will hold off on additional IVF as his O2 requirements have increased since bolus given in AM.  Elevated lactate could be related to nebs.  There's no acidemia, VBG was normal.  Will continue to monitor, follow in AM.      Concern for Heart Failure with Preserved EF on Presentation:  Elevated BNP on presentation Pt appears relatively euvolemic at this point on my exam Continue I/O, daily weights With COVID 19  infection, will attempt to keep him on the dryer side Lasix on hold today with above  Gross Hematuria: stable H/H on labs - follow repeat this afternoon.  Hold eliquis.  Not stable for CT at this time.  If continues, may need urology c/s. Urinalysis and urine culture Urology c/s - recommended hold eliquis, imaging when able   Ricardo Erickson. fib with RVR Patient found to be in Ricardo Erickson. fib with RVR with heart rate up to 140s on arrival to ED.  Rate overnight from 90's to 110's Continue amiodarone.  Metoprolol with holding parameters.  Treat COVID above. Eliquis on hold with hematuria Repeat EKG appears similar to priors with RVR and RBBB  History of PE -Continue home Eliquis for anticoagulation - on hold given above  COPD -Transition home inhaler therapy to Dulera 200-50 mcg 2 puffs twice daily and Incruse Ellipta 62.5 mg -xopenex, atrovent  BPH -Continue home finasteride and tamsulosin  QT prolongation -Cardiac monitoring -Keep potassium above 4 and magnesium above 2 -Takes Zyprexa at home. Hold at this time.  Avoid QT prolonging drugs if possible. -Repeat EKG in Ricardo Erickson.m. - persistently prolonged, follow  Dementia: delirium precautions.  Pt rather anxious, will trial low dose ativan.  Discussed with son given listed intolerance (delirium) -> will try low dose and follow.    Coagulase Negative Staph in 1/2 blood cx: follow repeat, suspect contaminant  DVT prophylaxis: eliquis Code Status: dnr Family Communication: son over phone 12/7 Disposition Plan: pending further improvement  Consultants:   PCCM  Procedures:  Echo IMPRESSIONS    1. Very limited study--difficult images in all attempred echo windows. Grossly normal LVEF without significant valve disease.  2. Left ventricular ejection fraction, by visual estimation, is 50 to 55%. The left ventricle has low normal function. There is no left ventricular hypertrophy.  3. Global right ventricle was not well visualized.The right  ventricular size is not well visualized. Right vetricular wall thickness was not assessed.  4. Left atrial size was not well visualized.  5. Right atrial size was not well visualized.  6. Trivial pericardial effusion is present.  7. The mitral valve was not well visualized. No evidence of mitral valve regurgitation. No evidence of mitral stenosis.  8. The tricuspid valve is not well visualized. Tricuspid valve regurgitation is not demonstrated.  9. The aortic valve was not well visualized. Aortic valve regurgitation is not visualized. No evidence of aortic valve sclerosis or stenosis. 10. The pulmonic valve was not well visualized. Pulmonic valve regurgitation is not visualized. 11. Aortic dilatation noted. 12. 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 13. The interatrial septum was not well visualized. 14. Left ventricular diastolic function could not be evaluated.  Antimicrobials:  Anti-infectives (From admission, onward)   Start     Dose/Rate Route Frequency Ordered Stop   11/19/19 1000  cefTRIAXone (ROCEPHIN) 2 g in sodium chloride 0.9 % 100 mL IVPB     2 g 200 mL/hr over 30 Minutes Intravenous Every 24  hours 11/19/19 0910 11/24/19 0959   11/19/19 1000  doxycycline (VIBRA-TABS) tablet 100 mg     100 mg Oral Every 12 hours 11/19/19 0911 11/24/19 0959   11/18/19 1000  remdesivir 100 mg in sodium chloride 0.9 % 100 mL IVPB     100 mg 200 mL/hr over 30 Minutes Intravenous Daily 11/26/2019 2105 11/22/19 0959   12/04/2019 2200  remdesivir 200 mg in sodium chloride 0.9% 250 mL IVPB     200 mg 580 mL/hr over 30 Minutes Intravenous Once 12/11/2019 2105 11/18/19 0043     Subjective: Ricardo Erickson&Ox2-3 Doesn't want to eat food, says it's not good Asking when he'll go home  Objective: Vitals:   11/19/19 0644 11/19/19 0728 11/19/19 0900 11/19/19 1129  BP:  (!) 87/57 98/70 113/76  Pulse:  87 61 85  Resp: (!) 29 (!) 21 (!) 21 (!) 21  Temp:  98.6 F (37 C)  97.7 F (36.5 C)    TempSrc:  Oral Oral Oral  SpO2: 90% (!) 89% (!) 89% 92%  Height:        Intake/Output Summary (Last 24 hours) at 11/19/2019 1315 Last data filed at 11/19/2019 0500 Gross per 24 hour  Intake 886.88 ml  Output 275 ml  Net 611.88 ml   There were no vitals filed for this visit.  Examination:  General: No acute distress. Cardiovascular: irregularly irregular Lungs: coarse breath sounds, mildly increased WOB Abdomen: Soft, nontender, nondistended  Neurological: Alert and oriented 3. Moves all extremities 4. Cranial nerves II through XII grossly intact. Skin: Warm and dry. No rashes or lesions. Extremities: No clubbing or cyanosis. No edema.    Data Reviewed: I have personally reviewed following labs and imaging studies  CBC: Recent Labs  Lab 11/14/19 1709 11/19/2019 1753 11/13/2019 1816 11/24/2019 2238 11/18/19 0350 11/19/19 0500  WBC 6.9 9.1  --   --  10.1 20.0*  NEUTROABS 4.3 7.9*  --   --  9.0* 18.1*  HGB 10.6* 13.3 12.9* 15.0 13.3 13.2  HCT 35.9* 42.1 38.0* 44.0 44.2 43.8  MCV 93.7 89.4  --   --  91.3 91.6  PLT 221 215  --   --  200 [id number]   Basic Metabolic Panel: Recent Labs  Lab 11/14/19 1709 12/05/2019 1753 11/28/2019 1816 11/23/2019 2238 11/18/19 0350 11/19/19 0500  NA 142 137 139 139 144 140  K 3.7 3.6 3.4* 3.5 4.3 4.2  CL 107 105  --   --  103 99  CO2 27 24  --   --  29 27  GLUCOSE 86 146*  --   --  127* 107*  BUN 15 18  --   --  18 27*  CREATININE 0.99 1.02  --   --  0.79 0.85  CALCIUM 7.9* 8.0*  --   --  8.4* 8.2*  MG  --   --   --   --  1.9 1.8   GFR: CrCl cannot be calculated (Unknown ideal weight.). Liver Function Tests: Recent Labs  Lab 11/23/2019 1753 11/18/19 0350 11/19/19 0500  AST 16 16 19   ALT 7 7 7   ALKPHOS 91 89 85  BILITOT 0.8 0.9 0.7  PROT 5.5* 6.0* 6.1*  ALBUMIN 2.3* 2.7* 2.6*   No results for input(s): LIPASE, AMYLASE in the last 168 hours. No results for input(s): AMMONIA in the last 168 hours. Coagulation Profile: No results for  input(s): INR, PROTIME in the last 168 hours. Cardiac Enzymes: No results for input(s): CKTOTAL, CKMB, CKMBINDEX,  TROPONINI in the last 168 hours. BNP (last 3 results) No results for input(s): PROBNP in the last 8760 hours. HbA1C: No results for input(s): HGBA1C in the last 72 hours. CBG: No results for input(s): GLUCAP in the last 168 hours. Lipid Profile: Recent Labs    11/24/2019 1753  TRIG 24   Thyroid Function Tests: No results for input(s): TSH, T4TOTAL, FREET4, T3FREE, THYROIDAB in the last 72 hours. Anemia Panel: Recent Labs    11/18/19 0830 11/19/19 0500  FERRITIN 317 489*   Sepsis Labs: Recent Labs  Lab 12/11/2019 1753 11/29/2019 1930 11/19/19 0600 11/19/19 0832 11/19/19 1147  PROCALCITON 0.10  --  0.22  --   --   LATICACIDVEN 2.0* 1.5  --  3.5* 4.7*    Recent Results (from the past 240 hour(s))  Blood Culture (routine x 2)     Status: None (Preliminary result)   Collection Time: 11/30/2019  6:04 PM   Specimen: BLOOD  Result Value Ref Range Status   Specimen Description BLOOD RIGHT HAND  Final   Special Requests   Final    BOTTLES DRAWN AEROBIC AND ANAEROBIC Blood Culture results may not be optimal due to an inadequate volume of blood received in culture bottles   Culture   Final    NO GROWTH 2 DAYS Performed at [facility], [place] [address] [place], [place] [address]    Report Status PENDING  Incomplete  Blood Culture (routine x 2)     Status: Abnormal   Collection Time: 11/26/2019  7:00 PM   Specimen: BLOOD  Result Value Ref Range Status   Specimen Description BLOOD LEFT WRIST  Final   Special Requests   Final    BOTTLES DRAWN AEROBIC AND ANAEROBIC Blood Culture adequate volume   Culture  Setup Time   Final    GRAM POSITIVE COCCI IN CLUSTERS AEROBIC BOTTLE ONLY CRITICAL RESULT CALLED TO, READ BACK BY AND VERIFIED WITH: T MEYER PHARMD 2245 11/18/19 Kimball Manske BROWNING    Culture (Mithcell Schumpert)  Final    STAPHYLOCOCCUS SPECIES (COAGULASE NEGATIVE) THE SIGNIFICANCE OF  ISOLATING THIS ORGANISM FROM Ricardo Erickson SINGLE SET OF BLOOD CULTURES WHEN MULTIPLE SETS ARE DRAWN IS UNCERTAIN. PLEASE NOTIFY THE MICROBIOLOGY DEPARTMENT WITHIN ONE WEEK IF SPECIATION AND SENSITIVITIES ARE REQUIRED. Performed at [facility], [place] [address], [place], [place] [address]    Report Status 11/19/2019 FINAL  Final  MRSA PCR Screening     Status: None   Collection Time: 11/18/19 12:31 AM   Specimen: Nasal Mucosa; Nasopharyngeal  Result Value Ref Range Status   MRSA by PCR NEGATIVE NEGATIVE Final    Comment:        The GeneXpert MRSA Assay (FDA approved for NASAL specimens only), is one component of Joffrey Kerce comprehensive MRSA colonization surveillance program. It is not intended to diagnose MRSA infection nor to guide or monitor treatment for MRSA infections. Performed at [facility], [place] [address], [place], [place] [address]   Culture, blood (routine x 2)     Status: None (Preliminary result)   Collection Time: 11/18/19  9:30 PM   Specimen: BLOOD  Result Value Ref Range Status   Specimen Description   Final    BLOOD RIGHT ANTECUBITAL Performed at [place] [address] [place],  [address]    Special Requests   Final    BOTTLES DRAWN AEROBIC ONLY Blood Culture adequate volume Performed at [place] [address] [place], [place]  [address]    Culture   Final    NO GROWTH < 12 HOURS Performed at [place] [address] [place], [place] [address]    Report Status PENDING  Incomplete  Culture, blood (routine x 2)     Status: None (Preliminary result)   Collection Time: 11/18/19  9:38 PM   Specimen: BLOOD  Result Value Ref Range Status   Specimen Description   Final    BLOOD RIGHT HAND Performed at [place] [address], [place], [place] [address]    Special Requests   Final    BOTTLES DRAWN AEROBIC ONLY Blood Culture adequate volume Performed at [place] [address] [place], [place] [address]    Culture   Final    NO GROWTH < 12 HOURS Performed at [place] [address] [place],  [address]    Report Status PENDING  Incomplete         Radiology Studies: Dg Chest Port 1 View  Result Date: 11/19/2019 CLINICAL DATA:  Shortness of breath. EXAM: PORTABLE CHEST 1 VIEW COMPARISON:  Radiograph yesterday, additional priors. CT 02/15/2019 FINDINGS: Mild progression in peripheral right upper lobe opacities since yesterday. Slight improvement in left upper lobe opacity. Streaky left lung base atelectasis/consolidation is unchanged. Chronic elevation of left hemidiaphragm. Unchanged heart size and mediastinal contours. Aortic atherosclerosis. No pneumothorax or large pleural effusion. IMPRESSION: 1. Mild progression in peripheral right upper lobe opacities since yesterday. 2. Slight improvement in the left upper lobe opacity. 3. Left basilar opacity may be atelectasis or pneumonia, and is unchanged. Electronically Signed   By: Keith Rake M.D.   On: 11/19/2019 06:41   Dg Chest Port 1 View  Result Date: 11/18/2019 CLINICAL DATA:  Hypoxia EXAM: PORTABLE CHEST 1 VIEW COMPARISON:  11/18/2019 FINDINGS: Diffuse bilateral airspace disease with progression in the upper lobes. Improvement in bibasilar airspace disease. No definite pleural effusion. Elevated left hemidiaphragm. Cardiac enlargement. Atherosclerotic aortic arch IMPRESSION: Progression of upper lobe airspace disease bilaterally may represent pneumonia or edema. Improvement in bibasilar airspace disease most likely due to atelectasis. Electronically Signed   By: Franchot Gallo M.D.   On: 11/18/2019 11:27   Dg Chest Port 1 View  Result Date: 11/26/2019 CLINICAL DATA:  Shortness of breath. EXAM: PORTABLE CHEST 1 VIEW COMPARISON:  11/14/2019 FINDINGS: There is cardiomegaly with evidence for developing pulmonary edema. Bibasilar airspace opacities are again  noted. There is elevation of the left hemidiaphragm. There is no pneumothorax. There may be Ricardo Erickson left-sided pleural effusion. There is no acute osseous abnormality. IMPRESSION: 1. Cardiomegaly with prominent interstitial lung markings may represent pulmonary edema. 2. Persistent but slightly improved bibasilar airspace opacities which may represent atelectasis or pneumonia. Electronically Signed   By: Constance Holster M.D.   On: 11/25/2019 19:06        Scheduled Meds:  sodium chloride   Intravenous Once   docusate sodium  100 mg Oral BID   doxycycline  100 mg Oral Q12H   finasteride  5 mg Oral Daily   FLUoxetine  10 mg Oral Daily   guaiFENesin  600 mg Oral BID   levalbuterol  2 puff Inhalation Q6H   loratadine  10 mg Oral Daily   methylPREDNISolone (SOLU-MEDROL) injection  40 mg Intravenous Q12H   metoprolol tartrate  12.5 mg Oral BID   mometasone-formoterol  2 puff Inhalation BID   pramipexole  0.5 mg Oral QHS   tamsulosin  0.8  mg Oral QPC supper   umeclidinium bromide  1 puff Inhalation Daily   vitamin C  500 mg Oral Daily   zinc sulfate  220 mg Oral Daily   Continuous Infusions:  amiodarone 30 mg/hr (11/19/19 1125)   cefTRIAXone (ROCEPHIN)  IV 2 g (11/19/19 1120)   remdesivir 100 mg in NS 100 mL 100 mg (11/19/19 1027)     LOS: 2 days    Time spent: over 30 min    Fayrene Helper, MD [facility] Pager AMION  If 7PM-7AM, please contact night-coverage [email or web address] Password [email or web address] 11/19/2019, 1:15 PM

## 2019-11-19 NOTE — Evaluation (Signed)
Clinical/Bedside Swallow Evaluation Patient Details  Name: Ricardo Erickson MRN: [id number] Date of Birth: 19-Feb-1941  Today's Date: 11/19/2019 Time: SLP Start Time (ACUTE ONLY): [id number] SLP Stop Time (ACUTE ONLY): 1552 SLP Time Calculation (min) (ACUTE ONLY): 17 min  Past Medical History:  Past Medical History:  Diagnosis Date  . CHF (congestive heart failure) ([place])   . Chronic back pain   . Dementia ([place])   . Depression   . Emphysema lung ([place])   . Hypertension   . Personal history of tobacco use, presenting hazards to health 08/06/2015  . Pollen allergies   . PTSD (post-traumatic stress disorder)   . Pulmonary embolism [facility])    Past Surgical History:  Past Surgical History:  Procedure Laterality Date  . BACK SURGERY    . KYPHOPLASTY N/A 06/27/2017   Procedure: KYPHOPLASTY -L5;  Surgeon: Hessie Knows, MD;  Location: [facility] ORS;  Service: Orthopedics;  Laterality: N/A;  . TONSILLECTOMY AND ADENOIDECTOMY  1980   HPI:  Ricardo Erickson is a 78 y.o. male with medical history significant of PE, COPD, chronic hypoxic respiratory failure on home oxygen, PTSD,chronic diastolic congestive heart failure, paroxysmal atrial fibrillation, depression, dementia presenting from [facility] for evaluation of shortness of breath and hypoxia.     Assessment / Plan / Recommendation Clinical Impression  Pt exhibits a mild-mod respiratory based dysphagia on 15 liters HFNC. Respiratory rate fluctuated 30's to mid-high 40's during po trials. Cued him to stop, take deep nasal inhalations and pursed lip exhalation and encouraged him not to verbalize during meals. Pt does not have dentures (lost at [facility]). RN reported pt exhibited difficulty masticating regular texture and was downgraded to Dys 2. Masticated pieces graham cracker in vanilla pudding. To conserve respiratory effort, did not observe with cracker. Recommend continue Dys 2, thin, rest breaks, pill with puree and ST follow up.        SLP Visit  Diagnosis: Dysphagia, unspecified (R13.10)    Aspiration Risk  Moderate aspiration risk    Diet Recommendation Dysphagia 2 (Fine chop);Thin liquid   Liquid Administration via: Cup;Straw Medication Administration: Whole meds with puree Supervision: Patient able to self feed;Intermittent supervision to cue for compensatory strategies Compensations: Slow rate;Small sips/bites;Other (Comment)(rest breaks) Postural Changes: Seated upright at 90 degrees    Other  Recommendations Oral Care Recommendations: Oral care BID   Follow up Recommendations Other (comment)(TBA)      Frequency and Duration min 1 x/week  2 weeks       Prognosis Prognosis for Safe Diet Advancement: Good      Swallow Study   General HPI: Ricardo Erickson is a 78 y.o. male with medical history significant of PE, COPD, chronic hypoxic respiratory failure on home oxygen, PTSD,chronic diastolic congestive heart failure, paroxysmal atrial fibrillation, depression, dementia presenting from [facility] for evaluation of shortness of breath and hypoxia.   Type of Study: Bedside Swallow Evaluation Previous Swallow Assessment: (see HPI) Diet Prior to this Study: Dysphagia 2 (chopped);Thin liquids Temperature Spikes Noted: No Respiratory Status: Other (comment)(HFNC 15 L) History of Recent Intubation: No Behavior/Cognition: Alert;Cooperative;Pleasant mood Oral Cavity Assessment: Dry Oral Care Completed by SLP: No Oral Cavity - Dentition: Dentures, not available;Edentulous;Other (Comment)(dentures are lost) Vision: Functional for self-feeding Self-Feeding Abilities: Able to feed self Patient Positioning: Upright in bed Baseline Vocal Quality: Normal Volitional Cough: Strong Volitional Swallow: Able to elicit    Oral/Motor/Sensory Function Overall Oral Motor/Sensory Function: Within functional limits   Ice Chips Ice chips: Not tested  Thin Liquid Thin Liquid: Impaired Presentation: Cup;Straw Pharyngeal  Phase  Impairments: Other (comments)(increased work of breathing)    Nectar Thick Nectar Thick Liquid: Not tested   Honey Thick Honey Thick Liquid: Not tested   Puree Puree: Impaired Presentation: Spoon Pharyngeal Phase Impairments: Other (comments)(increased WOB)   Solid     Solid: Not tested      Houston Siren 11/19/2019,4:22 PM  Orbie Pyo Colvin Caroli.Ed Risk analyst [phone] Office [phone]

## 2019-11-19 NOTE — Progress Notes (Signed)
Pt MEWS Score is red, HR up 130s, SpO2 89 15 L HFNC, 107/79 (89), AxO 3. MD came to bedside. PRN IV metoprolol given. Will continue to monitor. LR bolus 500 given, plasma given, amiodarone IV continuous infusion. Will continue to monitor closely.     Vital Signs MEWS/VS Documentation      11/19/2019 1538 11/19/2019 1556 11/19/2019 1616 11/19/2019 1632   MEWS Score:  3  4  5  4    MEWS Score Color:  Yellow  Red  Red  Red   Resp:  (!) 25  (!) 29  (!) 37  (!) 28   Pulse:  (!) 115  76  64  (!) 122   BP:  118/79  105/72  (!) 132/95  118/77   Temp:  98.1 F (36.7 C)  98.1 F (36.7 C)  98.3 F (36.8 C)  98.4 F (36.9 C)   O2 Device:  HFNC  HFNC  HFNC  HFNC   O2 Flow Rate (L/min):  15 L/min  15 L/min  15 L/min  15 L/min   Level of Consciousness:  Alert  Alert  Alert  Alert           Ricardo Erickson 11/19/2019,5:42 PM

## 2019-11-19 NOTE — Plan of Care (Signed)
  Problem: Education: Goal: Knowledge of risk factors and measures for prevention of condition will improve Outcome: Progressing   Problem: Coping: Goal: Psychosocial and spiritual needs will be supported Outcome: Progressing   Problem: Respiratory: Goal: Will maintain a patent airway Outcome: Progressing Goal: Complications related to the disease process, condition or treatment will be avoided or minimized Outcome: Progressing   Problem: Education: Goal: Knowledge of General Education information will improve Description: Including pain rating scale, medication(s)/side effects and non-pharmacologic comfort measures Outcome: Progressing   Problem: Health Behavior/Discharge Planning: Goal: Ability to manage health-related needs will improve Outcome: Progressing   Problem: Coping: Goal: Level of anxiety will decrease Outcome: Progressing   Problem: Safety: Goal: Ability to remain free from injury will improve Outcome: Progressing   Problem: Skin Integrity: Goal: Risk for impaired skin integrity will decrease Outcome: Progressing

## 2019-11-19 NOTE — Progress Notes (Signed)
Unable to obtain current weight for pharmacy use for Actrema due to sclae function not working on bed. Obtaining clean bed, to call pharmacy when weight has been taken

## 2019-11-19 NOTE — NC FL2 (Addendum)
[place] MEDICAID FL2 LEVEL OF CARE SCREENING TOOL     IDENTIFICATION  Patient Name: Ricardo Erickson Birthdate: Sep 13, 1941 Sex: male Admission Date (Current Location): 11/28/2019  [facility] and [place] Number:  Herbalist and Address:  [facility], [place] [address] [place], [place] [address]      Provider Number: [id number]  Attending Physician Name and Address:  Elodia Florence., *  Relative Name and Phone Number:       Current Level of Care: Hospital Recommended Level of Care: [place] Prior Approval Number:    Date Approved/Denied:   PASRR Number: [id number]  Discharge Plan: SNF    Current Diagnoses: Patient Active Problem List   Diagnosis Date Noted  . Pneumonia due to COVID-19 virus 12/02/2019  . Acute on chronic respiratory failure with hypoxia ([place]) 12/02/2019  . Atrial fibrillation with rapid ventricular response ([place]) 12/04/2019  . Dementia ([place]) 07/19/2019  . CHF (congestive heart failure) ([place]) 07/18/2019  . CHF exacerbation ([place]) 04/12/2019  . Influenza A 02/15/2019  . Pneumonia 11/27/2018  . Pulmonary emboli ([place]) 11/18/2017  . Tobacco use disorder 07/24/2017  . Encephalopathy   . Palliative care encounter   . Goals of care, counseling/discussion   . Pulmonary embolus ([place])   . UTI (urinary tract infection) 07/19/2017  . PTSD (post-traumatic stress disorder) 06/28/2017  . Adjustment disorder with anxiety 06/28/2017  . Closed compression fracture of L5 lumbar vertebra   . Left low back pain   . Weakness   . A-fib ([place]) 06/24/2017  . Prediabetes 01/18/2017  . Prostate cancer ([place]) 01/18/2017  . Anxiety 01/18/2017  . Preventative health care 04/15/2016  . Restless leg syndrome 04/03/2016  . COPD (chronic obstructive pulmonary disease) ([place]) 04/03/2016  . BPH (benign prostatic hyperplasia) 04/03/2016  . Personal history of tobacco use, presenting hazards to health 08/06/2015    Orientation  RESPIRATION BLADDER Height & Weight     Self, Time, Situation  Normal Incontinent Weight:   Height:  5\' 7"  (170.2 cm)  BEHAVIORAL SYMPTOMS/MOOD NEUROLOGICAL BOWEL NUTRITION STATUS      Incontinent Diet  AMBULATORY STATUS COMMUNICATION OF NEEDS Skin   Extensive Assist Verbally Normal                       Personal Care Assistance Level of Assistance  Bathing, Dressing, Total care Bathing Assistance: Maximum assistance   Dressing Assistance: Maximum assistance Total Care Assistance: Maximum assistance   Functional Limitations Info             SPECIAL CARE FACTORS FREQUENCY                       Contractures Contractures Info: Not present    Additional Factors Info                  Current Medications (11/19/2019):  This is the current hospital active medication list Current Facility-Administered Medications  Medication Dose Route Frequency Provider Last Rate Last Dose  . acetaminophen (TYLENOL) tablet 650 mg  650 mg Oral Q6H PRN Shela Leff, MD   650 mg at 11/19/19 0640  . amiodarone (NEXTERONE PREMIX) 360-4.14 MG/200ML-% (1.8 mg/mL) IV infusion  30 mg/hr Intravenous Continuous Shela Leff, MD 16.67 mL/hr at 11/18/19 2315 30 mg/hr at 11/18/19 2315  . apixaban (ELIQUIS) tablet 5 mg  5 mg Oral BID Shela Leff, MD   5 mg at 11/19/19 1000  . cefTRIAXone (  ROCEPHIN) 2 g in sodium chloride 0.9 % 100 mL IVPB  2 g Intravenous Q24H Elodia Florence., MD      . chlorpheniramine-HYDROcodone (TUSSIONEX) 10-8 MG/5ML suspension 5 mL  5 mL Oral Q12H PRN Shela Leff, MD      . docusate sodium (COLACE) capsule 100 mg  100 mg Oral BID Shela Leff, MD   100 mg at 11/19/19 1001  . doxycycline (VIBRA-TABS) tablet 100 mg  100 mg Oral Q12H Elodia Florence., MD      . finasteride (PROSCAR) tablet 5 mg  5 mg Oral Daily Shela Leff, MD   5 mg at 11/19/19 1000  . FLUoxetine (PROZAC) capsule 10 mg  10 mg Oral Daily Shela Leff,  MD   10 mg at 11/19/19 1000  . guaiFENesin (MUCINEX) 12 hr tablet 600 mg  600 mg Oral BID Shela Leff, MD   600 mg at 11/19/19 1001  . guaiFENesin-dextromethorphan (ROBITUSSIN DM) 100-10 MG/5ML syrup 10 mL  10 mL Oral Q4H PRN Shela Leff, MD      . ipratropium (ATROVENT HFA) inhaler 2 puff  2 puff Inhalation Q4H PRN Shela Leff, MD   2 puff at 11/19/19 0000  . lactated ringers bolus 500 mL  500 mL Intravenous Once Elodia Florence., MD      . levalbuterol [facility] HFA) inhaler 2 puff  2 puff Inhalation Q6H Elodia Florence., MD   2 puff at 11/19/19 0545  . loratadine (CLARITIN) tablet 10 mg  10 mg Oral Daily Shela Leff, MD   10 mg at 11/19/19 1000  . methylPREDNISolone sodium succinate (SOLU-MEDROL) 40 mg/mL injection 40 mg  40 mg Intravenous Q12H Elodia Florence., MD   40 mg at 11/19/19 1018  . metoprolol tartrate (LOPRESSOR) tablet 12.5 mg  12.5 mg Oral BID Elodia Florence., MD   12.5 mg at 11/18/19 1858  . mometasone-formoterol (DULERA) 200-5 MCG/ACT inhaler 2 puff  2 puff Inhalation BID Shela Leff, MD   2 puff at 11/19/19 1023  . pramipexole (MIRAPEX) tablet 0.5 mg  0.5 mg Oral QHS Shela Leff, MD   0.5 mg at 11/18/19 2100  . remdesivir 100 mg in sodium chloride 0.9 % 100 mL IVPB  100 mg Intravenous Daily Shela Leff, MD 200 mL/hr at 11/19/19 1027 100 mg at 11/19/19 1027  . tamsulosin (FLOMAX) capsule 0.8 mg  0.8 mg Oral QPC supper Shela Leff, MD   0.8 mg at 11/18/19 1634  . umeclidinium bromide (INCRUSE ELLIPTA) 62.5 MCG/INH 1 puff  1 puff Inhalation Daily Shela Leff, MD   1 puff at 11/19/19 1023  . vitamin C (ASCORBIC ACID) tablet 500 mg  500 mg Oral Daily Shela Leff, MD   500 mg at 11/19/19 1000  . zinc sulfate capsule 220 mg  220 mg Oral Daily Shela Leff, MD   220 mg at 11/19/19 1001     Discharge Medications: Please see discharge summary for a list of discharge  medications.  Relevant Imaging Results:  Relevant Lab Results:   Additional Information SSN [id number]  Joaquin Courts, RN

## 2019-11-19 NOTE — Progress Notes (Signed)
Patient's MEWS again noted to be red this morning due to HR, RR, and temperature (ongoing from last night). Metoprolol and Tylenol given, and ice packs placed around patient. Will increase VS monitoring.

## 2019-11-19 NOTE — Progress Notes (Signed)
Pt MEWS score is red. BP 87/57 (66), a.fib/BBB less than 120 HR, 90 with 8L HFNC. MD aware and coming to bedside. Pt AO x3, will continue to monitor closely.     Vital Signs MEWS/VS Documentation      11/19/2019 0636 11/19/2019 0644 11/19/2019 0728 11/19/2019 0731   MEWS Score:  5  4  4  4    MEWS Score Color:  Red  Red  Red  Red   Resp:  -  (!) 29  (!) 21  -   Pulse:  -  -  87  -   BP:  -  -  (!) 87/57  -   Temp:  (!) 101.1 F (38.4 C)  -  -  -   O2 Device:  -  HFNC  HFNC  -   O2 Flow Rate (L/min):  -  8 L/min  8 L/min  -   Level of Consciousness:  -  -  Alert  Alert           Levie Heritage 11/19/2019,7:47 AM

## 2019-11-19 NOTE — Progress Notes (Signed)
Ok to limit lopressor to 3 doses per Dr Florene Glen.  Onnie Boer, PharmD, BCIDP, AAHIVP, CPP Infectious Disease Pharmacist 11/19/2019 5:21 PM

## 2019-11-20 ENCOUNTER — Inpatient Hospital Stay (HOSPITAL_COMMUNITY): Payer: Medicare Other

## 2019-11-20 DIAGNOSIS — J9621 Acute and chronic respiratory failure with hypoxia: Secondary | ICD-10-CM

## 2019-11-20 DIAGNOSIS — Z515 Encounter for palliative care: Secondary | ICD-10-CM

## 2019-11-20 DIAGNOSIS — I509 Heart failure, unspecified: Secondary | ICD-10-CM

## 2019-11-20 DIAGNOSIS — Z66 Do not resuscitate: Secondary | ICD-10-CM | POA: Diagnosis present

## 2019-11-20 DIAGNOSIS — I2699 Other pulmonary embolism without acute cor pulmonale: Secondary | ICD-10-CM

## 2019-11-20 LAB — PREPARE FRESH FROZEN PLASMA

## 2019-11-20 LAB — CBC WITH DIFFERENTIAL/PLATELET
Abs Immature Granulocytes: 0.34 10*3/uL — ABNORMAL HIGH (ref 0.00–0.07)
Basophils Absolute: 0 10*3/uL (ref 0.0–0.1)
Basophils Relative: 0 %
Eosinophils Absolute: 0 10*3/uL (ref 0.0–0.5)
Eosinophils Relative: 0 %
HCT: 40.7 % (ref 39.0–52.0)
Hemoglobin: 12.4 g/dL — ABNORMAL LOW (ref 13.0–17.0)
Immature Granulocytes: 2 %
Lymphocytes Relative: 3 %
Lymphs Abs: 0.6 10*3/uL — ABNORMAL LOW (ref 0.7–4.0)
MCH: 27.4 pg (ref 26.0–34.0)
MCHC: 30.5 g/dL (ref 30.0–36.0)
MCV: 90 fL (ref 80.0–100.0)
Monocytes Absolute: 0.5 10*3/uL (ref 0.1–1.0)
Monocytes Relative: 3 %
Neutro Abs: 17.5 10*3/uL — ABNORMAL HIGH (ref 1.7–7.7)
Neutrophils Relative %: 92 %
Platelets: 174 10*3/uL (ref 150–400)
RBC: 4.52 MIL/uL (ref 4.22–5.81)
RDW: 14.6 % (ref 11.5–15.5)
WBC: 18.9 10*3/uL — ABNORMAL HIGH (ref 4.0–10.5)
nRBC: 0 % (ref 0.0–0.2)

## 2019-11-20 LAB — COMPREHENSIVE METABOLIC PANEL
ALT: 9 U/L (ref 0–44)
AST: 19 U/L (ref 15–41)
Albumin: 2.5 g/dL — ABNORMAL LOW (ref 3.5–5.0)
Alkaline Phosphatase: 74 U/L (ref 38–126)
Anion gap: 8 (ref 5–15)
BUN: 34 mg/dL — ABNORMAL HIGH (ref 8–23)
CO2: 29 mmol/L (ref 22–32)
Calcium: 8.3 mg/dL — ABNORMAL LOW (ref 8.9–10.3)
Chloride: 102 mmol/L (ref 98–111)
Creatinine, Ser: 0.87 mg/dL (ref 0.61–1.24)
GFR calc Af Amer: >60 mL/min (ref >60)
GFR calc non Af Amer: >60 mL/min (ref >60)
Glucose, Bld: 126 mg/dL — ABNORMAL HIGH (ref 70–99)
Potassium: 4 mmol/L (ref 3.5–5.1)
Sodium: 139 mmol/L (ref 135–145)
Total Bilirubin: 0.5 mg/dL (ref 0.3–1.2)
Total Protein: 5.7 g/dL — ABNORMAL LOW (ref 6.5–8.1)

## 2019-11-20 LAB — D-DIMER, QUANTITATIVE: D-Dimer, Quant: 0.83 ug/mL-FEU — ABNORMAL HIGH (ref 0.00–0.50)

## 2019-11-20 LAB — BPAM FFP
Blood Product Expiration Date: 202012091352
ISSUE DATE / TIME: 202012081355
Unit Type and Rh: 5100

## 2019-11-20 LAB — PROCALCITONIN: Procalcitonin: 0.44 ng/mL

## 2019-11-20 LAB — MAGNESIUM: Magnesium: 1.9 mg/dL (ref 1.7–2.4)

## 2019-11-20 LAB — LACTIC ACID, PLASMA: Lactic Acid, Venous: 2.2 mmol/L (ref 0.5–1.9)

## 2019-11-20 LAB — TYPE AND SCREEN
ABO/RH(D): O POS
Antibody Screen: NEGATIVE

## 2019-11-20 LAB — GLUCOSE, CAPILLARY: Glucose-Capillary: 98 mg/dL (ref 70–99)

## 2019-11-20 LAB — C-REACTIVE PROTEIN: CRP: 20 mg/dL — ABNORMAL HIGH (ref <1.0)

## 2019-11-20 LAB — FERRITIN: Ferritin: 370 ng/mL — ABNORMAL HIGH (ref 24–336)

## 2019-11-20 MED ORDER — MORPHINE 100MG IN NS 100ML (1MG/ML) PREMIX INFUSION
1.0000 mg/h | INTRAVENOUS | Status: DC
  Administered 2019-11-20: 18:00:00 10 mg/h via INTRAVENOUS
  Filled 2019-11-20 (×2): qty 100

## 2019-11-20 MED ORDER — PHENOL 1.4 % MT LIQD
1.0000 | OROMUCOSAL | Status: DC | PRN
  Filled 2019-11-20: qty 177

## 2019-11-20 MED ORDER — PRO-STAT SUGAR FREE PO LIQD
30.0000 mL | Freq: Three times a day (TID) | ORAL | Status: DC
  Filled 2019-11-20: qty 30

## 2019-11-20 MED ORDER — HALOPERIDOL LACTATE 5 MG/ML IJ SOLN: 2.0000 mg | Freq: Four times a day (QID) | INTRAMUSCULAR | Status: DC | PRN

## 2019-11-20 MED ORDER — ORAL CARE MOUTH RINSE
15.0000 mL | Freq: Two times a day (BID) | OROMUCOSAL | Status: DC
  Administered 2019-11-20: 15 mL via OROMUCOSAL

## 2019-11-20 MED ORDER — MORPHINE BOLUS VIA INFUSION
2.0000 mg | INTRAVENOUS | Status: DC | PRN
  Filled 2019-11-20: qty 4

## 2019-11-20 MED ORDER — GLYCOPYRROLATE 0.2 MG/ML IJ SOLN: 0.2000 mg | INTRAMUSCULAR | Status: DC | PRN

## 2019-11-21 NOTE — Progress Notes (Signed)
MD on call Vera paged and notified patient expired.. [place] donor services notified. Awaiting MD's arrival on unit to sign death certificate.

## 2019-11-21 NOTE — Progress Notes (Signed)
32 mL of continuous morphine infusion wasted with York Pellant RN.

## 2019-11-21 NOTE — Progress Notes (Signed)
Called patient's son and daughter to let them know that patient expired. Spoke with son, left voicemail with daughter.

## 2019-11-22 LAB — CULTURE, BLOOD (ROUTINE X 2): Culture: NO GROWTH

## 2019-11-24 LAB — CULTURE, BLOOD (ROUTINE X 2)
Culture: NO GROWTH
Culture: NO GROWTH
Special Requests: ADEQUATE
Special Requests: ADEQUATE

## 2019-11-27 ENCOUNTER — Ambulatory Visit: Payer: Medicare Other | Admitting: Orthopaedic Surgery

## 2019-12-13 NOTE — Death Summary Note (Signed)
DEATH SUMMARY   Patient Details  Name: Ricardo Erickson MRN: [id number] DOB: 1941-11-09  Admission/Discharge Information   Admit Date:  12/14/2019  Date of Death: Date of Death: 2019-12-18  Time of Death: Time of Death: 04-01-2349  Length of Stay: 3  Referring Physician: Place, Maryclare Bean) for Hospitalization  Acute respiratory failure due to covid-19 pneumonia  Diagnoses  Preliminary cause of death: Acute  on chronic hypoxic respiratory failure Secondary Diagnoses (including complications and co-morbidities):  Principal Problem:   Pneumonia due to COVID-19 virus Active Problems:   Pulmonary emboli (HCC)   CHF exacerbation (HCC)   Acute on chronic respiratory failure with hypoxia ([place])   Atrial fibrillation with rapid ventricular response ([place])   DNR (do not resuscitate) present on admission COPD Hypoalbuminemia QT prolongation Acute on chronic HFpEF HTN Hypotension Lactic acidosis Severe sepsis (tachycardia, tachypnea, and hypotension with lactic acid elevation) due to pneumonia BPH Dementia Gross hematuria Acute blood loss anemia RBBB Coagulase negative staphylococcus bacteremia Goals of care discussion Palliative care by specialist Tobacco abuse Lymphopenia Bacteriuria   Brief Hospital Course (including significant findings, care, treatment, and services provided and events leading to death)  Ricardo Erickson is a 79 y.o. year old male who has a history ofPE, COPD,chronic hypoxic respiratory failure on home oxygen,chronic diastolic congestive heart failure, paroxysmal atrial fibrillation,depression, and dementia presenting from [facility] for evaluation of shortness of breath and hypoxia on 12-14-2023. Per EMS, patient was satting 85% on his baseline 4 L oxygen via nasal cannula.He was placed on 15 L oxygen via nonrebreather and sats improved to 95%. Found to be febrile, hypotensive with lactic acid elevation in A. fib with RVR. BNP elevated and CXR indicated  interstitial prominence in addition to bibasilar airspace opacities concerning for acute CHF and multifocal pneumonia. SARS-CoV-2 rapid antigen test was positive on 12/3.No history could be obtained from the patient at admission due to respiratory distress. EDP spoke with the patient's son who confirmed his DNR status at admission.   The patient was admitted for COVID 19 pneumonia causing severe sepsis and acute on chronic hypoxic respiratory failure with acute on chronic HFpEF complicated by atrial fibrillation with rapid ventricular response further complicated by hypotension. Critical care medicine and cardiology were consulted. Treatment with remdesivir, steroids, and amiodarone infusion in addition to maximal supplemental oxygen support was given with limited success. Diuresis was limited by hypotension. Despite AFib and history of PE, anticoagulation had to be stopped due to gross hematuria. Palliative care was consulted on 12/8. Unfortunately his hypoxia worsened requiring both 15L HFNC and 15L NRB with saturations worsening into 80%'s. Inflammatory markers proceeded to increase and lactic acid also rose in this setting as well despite small bolus of IV fluids, so antibiotics were added. At that point, further goals of care discussions were undertaken with the patient and family. After family visitation, comfort measures were instituted and the patient died shortly thereafter.  Pertinent Labs and Studies  Significant Diagnostic Studies DG CHEST PORT 1 VIEW  Result Date: 2019/12/17 CLINICAL DATA:  Hypoxia, COVID-19 EXAM: PORTABLE CHEST 1 VIEW COMPARISON:  11/19/2019 FINDINGS: No significant interval change in AP portable examination with extensive interstitial and heterogeneous airspace opacity throughout, somewhat more dense and consolidative at the periphery of the right upper lobe and left lung base. No new airspace opacity. Cardiomegaly. IMPRESSION: 1. No significant interval change in extensive  interstitial and heterogeneous airspace opacity throughout, somewhat more dense and consolidative at the periphery of the right upper lobe  and left lung base. Findings are generally in keeping with COVID-19 pneumonia. No new airspace opacity. 2. Cardiomegaly. Electronically Signed   By: Eddie Candle M.D.   On: 2019/12/15 08:31   DG CHEST PORT 1 VIEW  Result Date: 11/19/2019 CLINICAL DATA:  Shortness of breath. EXAM: PORTABLE CHEST 1 VIEW COMPARISON:  Radiograph yesterday, additional priors. CT 02/15/2019 FINDINGS: Mild progression in peripheral right upper lobe opacities since yesterday. Slight improvement in left upper lobe opacity. Streaky left lung base atelectasis/consolidation is unchanged. Chronic elevation of left hemidiaphragm. Unchanged heart size and mediastinal contours. Aortic atherosclerosis. No pneumothorax or large pleural effusion. IMPRESSION: 1. Mild progression in peripheral right upper lobe opacities since yesterday. 2. Slight improvement in the left upper lobe opacity. 3. Left basilar opacity may be atelectasis or pneumonia, and is unchanged. Electronically Signed   By: Keith Rake M.D.   On: 11/19/2019 06:41   DG CHEST PORT 1 VIEW  Result Date: 11/18/2019 CLINICAL DATA:  Hypoxia EXAM: PORTABLE CHEST 1 VIEW COMPARISON:  11/18/2019 FINDINGS: Diffuse bilateral airspace disease with progression in the upper lobes. Improvement in bibasilar airspace disease. No definite pleural effusion. Elevated left hemidiaphragm. Cardiac enlargement. Atherosclerotic aortic arch IMPRESSION: Progression of upper lobe airspace disease bilaterally may represent pneumonia or edema. Improvement in bibasilar airspace disease most likely due to atelectasis. Electronically Signed   By: Franchot Gallo M.D.   On: 11/18/2019 11:27   DG Chest Port 1 View  Result Date: 11/27/2019 CLINICAL DATA:  Shortness of breath. EXAM: PORTABLE CHEST 1 VIEW COMPARISON:  11/14/2019 FINDINGS: There is cardiomegaly with evidence  for developing pulmonary edema. Bibasilar airspace opacities are again noted. There is elevation of the left hemidiaphragm. There is no pneumothorax. There may be a left-sided pleural effusion. There is no acute osseous abnormality. IMPRESSION: 1. Cardiomegaly with prominent interstitial lung markings may represent pulmonary edema. 2. Persistent but slightly improved bibasilar airspace opacities which may represent atelectasis or pneumonia. Electronically Signed   By: Constance Holster M.D.   On: 12/07/2019 19:06   DG Chest Port 1 View  Result Date: 11/14/2019 CLINICAL DATA:  Short of breath.  COVID-19 positive. EXAM: PORTABLE CHEST 1 VIEW COMPARISON:  07/18/2019, 04/12/2019 FINDINGS: Bibasilar airspace disease similar to the recent study. Significant progression since 04/12/2019. Elevated left hemidiaphragm. Small left effusion. Pulmonary vascular congestion again noted. Chronic left rib fractures again noted. IMPRESSION: Extensive bibasilar airspace disease left greater than right with vascular congestion. This could represent pneumonia or heart failure. Electronically Signed   By: Franchot Gallo M.D.   On: 11/14/2019 16:28   ECHOCARDIOGRAM LIMITED  Result Date: 11/18/2019   ECHOCARDIOGRAM REPORT   Patient Name:   Ricardo Erickson         Date of Exam: 11/18/2019 Medical Rec #:  [id number]              Height:       67.0 in Accession #:    [id number]             Weight:       181.0 lb Date of Birth:  08/24/1941              BSA:          1.94 m Patient Age:    55 years               BP:           113/68 mmHg Patient Gender: M  HR:           108 bpm. [facility] Procedure: Limited Echo, Limited Color Doppler and Cardiac Doppler Indications:    CHF  History:        Patient has prior history of Echocardiogram examinations, most                 recent 02/20/2019. COVID Positive; Risk Factors:Hypertension.  Sonographer:    Mikki Santee RDCS (AE) Referring Phys:  [id number] [facility]  Sonographer Comments: Technically challenging study due to limited acoustic windows, suboptimal apical window and suboptimal parasternal window. Image acquisition challenging due to COPD. IMPRESSIONS  1. Very limited study--difficult images in all attempred echo windows. Grossly normal LVEF without significant valve disease.  2. Left ventricular ejection fraction, by visual estimation, is 50 to 55%. The left ventricle has low normal function. There is no left ventricular hypertrophy.  3. Global right ventricle was not well visualized.The right ventricular size is not well visualized. Right vetricular wall thickness was not assessed.  4. Left atrial size was not well visualized.  5. Right atrial size was not well visualized.  6. Trivial pericardial effusion is present.  7. The mitral valve was not well visualized. No evidence of mitral valve regurgitation. No evidence of mitral stenosis.  8. The tricuspid valve is not well visualized. Tricuspid valve regurgitation is not demonstrated.  9. The aortic valve was not well visualized. Aortic valve regurgitation is not visualized. No evidence of aortic valve sclerosis or stenosis. 10. The pulmonic valve was not well visualized. Pulmonic valve regurgitation is not visualized. 11. Aortic dilatation noted. 12. 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 13. The interatrial septum was not well visualized. 14. Left ventricular diastolic function could not be evaluated. FINDINGS  Left Ventricle: Left ventricular ejection fraction, by visual estimation, is 50 to 55%. The left ventricle has low normal function. The left ventricle is not well visualized. There is no left ventricular hypertrophy. Left ventricular diastolic function could not be evaluated. Right Ventricle: The right ventricular size is not well visualized. Right vetricular wall thickness was not assessed. Global RV systolic function is was not well visualized. Left  Atrium: Left atrial size was not well visualized. Right Atrium: Right atrial size was not well visualized Pericardium: Trivial pericardial effusion is present. Mitral Valve: The mitral valve was not well visualized. No evidence of mitral valve stenosis by observation. No evidence of mitral valve regurgitation. Tricuspid Valve: The tricuspid valve is not well visualized. Tricuspid valve regurgitation is not demonstrated. Aortic Valve: The aortic valve was not well visualized. Aortic valve regurgitation is not visualized. The aortic valve is structurally normal, with no evidence of sclerosis or stenosis. Pulmonic Valve: The pulmonic valve was not well visualized. Pulmonic valve regurgitation is not visualized. Aorta: Aortic dilatation noted. 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 Pulmonary Artery: The pulmonary artery is not well seen. Venous: The inferior vena cava was not well visualized. IAS/Shunts: The interatrial septum was not well visualized.  LEFT VENTRICLE PLAX 2D LVIDd:         3.36 cm LVIDs:         2.53 cm LV PW:         1.16 cm LV IVS:        1.23 cm LVOT diam:     2.50 cm LV SV:         23 ml LV SV Index:  11.61 LVOT Area:     4.91 cm  LEFT ATRIUM             Index LA diam:        3.50 cm 1.81 cm/m LA Vol (A2C):   31.6 ml 16.30 ml/m LA Vol (A4C):   46.3 ml 23.89 ml/m LA Biplane Vol: 38.5 ml 19.86 ml/m   AORTA Ao Root diam: 4.40 cm  SHUNTS Systemic Diam: 2.50 cm  Buford Dresser MD Electronically signed by Buford Dresser MD Signature Date/Time: 11/18/2019/3:46:54 PM    Final     Microbiology Recent Results (from the past 240 hour(s))  Blood Culture (routine x 2)     Status: None (Preliminary result)   Collection Time: 12/01/2019  6:04 PM   Specimen: BLOOD  Result Value Ref Range Status   Specimen Description BLOOD RIGHT HAND  Final   Special Requests   Final    BOTTLES DRAWN AEROBIC AND ANAEROBIC Blood Culture results may not be optimal due to an  inadequate volume of blood received in culture bottles   Culture   Final    NO GROWTH 3 DAYS Performed at [facility], [place] [address], [place], [place] [address]    Report Status PENDING  Incomplete  Blood Culture (routine x 2)     Status: Abnormal   Collection Time: 12/01/2019  7:00 PM   Specimen: BLOOD  Result Value Ref Range Status   Specimen Description BLOOD LEFT WRIST  Final   Special Requests   Final    BOTTLES DRAWN AEROBIC AND ANAEROBIC Blood Culture adequate volume   Culture  Setup Time   Final    GRAM POSITIVE COCCI IN CLUSTERS AEROBIC BOTTLE ONLY CRITICAL RESULT CALLED TO, READ BACK BY AND VERIFIED WITH: T MEYER PHARMD 2245 11/18/19 A BROWNING    Culture (A)  Final    STAPHYLOCOCCUS SPECIES (COAGULASE NEGATIVE) THE SIGNIFICANCE OF ISOLATING THIS ORGANISM FROM A SINGLE SET OF BLOOD CULTURES WHEN MULTIPLE SETS ARE DRAWN IS UNCERTAIN. PLEASE NOTIFY THE MICROBIOLOGY DEPARTMENT WITHIN ONE WEEK IF SPECIATION AND SENSITIVITIES ARE REQUIRED. Performed at [facility], [place] [address] [place], [place] [address]    Report Status 11/19/2019 FINAL  Final  MRSA PCR Screening     Status: None   Collection Time: 11/18/19 12:31 AM   Specimen: Nasal Mucosa; Nasopharyngeal  Result Value Ref Range Status   MRSA by PCR NEGATIVE NEGATIVE Final    Comment:        The GeneXpert MRSA Assay (FDA approved for NASAL specimens only), is one component of a comprehensive MRSA colonization surveillance program. It is not intended to diagnose MRSA infection nor to guide or monitor treatment for MRSA infections. Performed at [facility], [place] [address] [place], [place] [address]   Culture, blood (routine x 2)     Status: None (Preliminary result)   Collection Time: 11/18/19  9:30 PM   Specimen: BLOOD  Result Value Ref Range Status   Specimen Description   Final    BLOOD RIGHT ANTECUBITAL Performed at [place] [address]  [place], [place] [address]    Special Requests   Final    BOTTLES DRAWN AEROBIC ONLY Blood Culture adequate volume Performed at [place] [address] [place], [place] [address]    Culture   Final    NO GROWTH 1 DAY Performed at [facility], [place] [address], [place], [place] [address]  Report Status PENDING  Incomplete  Culture, blood (routine x 2)     Status: None (Preliminary result)   Collection Time: 11/18/19  9:38 PM   Specimen: BLOOD  Result Value Ref Range Status   Specimen Description   Final    BLOOD RIGHT HAND Performed at [place] [address], [place], [place] [address]    Special Requests   Final    BOTTLES DRAWN AEROBIC ONLY Blood Culture adequate volume Performed at [place] [address], [place], [place] [address]    Culture   Final    NO GROWTH 1 DAY Performed at [facility], [place] [address], [place],  [address]    Report Status PENDING  Incomplete    Lab Basic Metabolic Panel: Recent Labs  Lab 11/14/19 1709 12/03/2019 1753 12/02/2019 2238 11/18/19 0350 11/19/19 0500 11/19/19 1414 12-12-2019 0215  NA 142 137 139 144 140 140 139  K 3.7 3.6 3.5 4.3 4.2 3.9 4.0  CL 107 105  --  103 99  --  102  CO2 27 24  --  29 27  --  29  GLUCOSE 86 146*  --  127* 107*  --  126*  BUN 15 18  --  18 27*  --  34*  CREATININE 0.99 1.02  --  0.79 0.85  --  0.87  CALCIUM 7.9* 8.0*  --  8.4* 8.2*  --  8.3*  MG  --   --   --  1.9 1.8  --  1.9   Liver Function Tests: Recent Labs  Lab 11/19/2019 1753 11/18/19 0350 11/19/19 0500 2019-12-12 0215  AST 16 16 19 19   ALT 7 7 7 9   ALKPHOS 91 89 85 74  BILITOT 0.8 0.9 0.7 0.5  PROT 5.5* 6.0* 6.1* 5.7*  ALBUMIN 2.3* 2.7* 2.6* 2.5*   No results for input(s): LIPASE, AMYLASE in the last 168 hours. No results for input(s): AMMONIA in the last 168 hours. CBC: Recent Labs  Lab 11/14/19 1709 12/12/2019 1753 11/18/19 0350 11/19/19 0500  11/19/19 1400 11/19/19 1414 12/12/2019 0215  WBC 6.9 9.1 10.1 20.0*  --   --  18.9*  NEUTROABS 4.3 7.9* 9.0* 18.1*  --   --  17.5*  HGB 10.6* 13.3 13.3 13.2 13.8 15.3 12.4*  HCT 35.9* 42.1 44.2 43.8 45.2 45.0 40.7  MCV 93.7 89.4 91.3 91.6  --   --  90.0  PLT 221 215 200 180  --   --  174   Cardiac Enzymes: No results for input(s): CKTOTAL, CKMB, CKMBINDEX, TROPONINI in the last 168 hours. Sepsis Labs: Recent Labs  Lab 11/29/2019 1753 11/18/19 0350 11/19/19 0500 11/19/19 0600 11/19/19 0832 11/19/19 1147 11/19/19 1430 12-12-2019 0215  PROCALCITON 0.10  --   --  0.22  --   --   --  0.44  WBC 9.1 10.1 20.0*  --   --   --   --  18.9*  LATICACIDVEN 2.0*  --   --   --  3.5* 4.7* 5.0* 2.2*    Procedures/Operations  None   Patrecia Pour, MD 11/21/2019, 7:41 AM

## 2019-12-13 NOTE — Plan of Care (Addendum)
Requiring about 10L HFNC with labored breathing. Productive cough, flutter valve encouraged. Blood pressure remains soft. Heart rate with better control tonight 90-110s. Noted to bleed easily after needle sticks and continues to have dark red hematuria. Minimal bleeding from infusing IV sites. Generalized edema and coarse/wheezing lung sounds. SCDs  Remain in place.  Problem: Coping: Goal: Psychosocial and spiritual needs will be supported Outcome: Progressing   Problem: Respiratory: Goal: Will maintain a patent airway Outcome: Progressing   Problem: Safety: Goal: Ability to remain free from injury will improve Outcome: Progressing   Problem: Skin Integrity: Goal: Risk for impaired skin integrity will decrease Outcome: Progressing

## 2019-12-13 NOTE — Progress Notes (Signed)
Initial Nutrition Assessment  DOCUMENTATION CODES:      INTERVENTION:  Ensure Enlive with breakfast tray daily  Magic cup with lunch and dinner  ProStat 30 ml TID   NUTRITION DIAGNOSIS:   Inadequate oral intake related to acute illness(COVID-19 pneumonia) as evidenced by meal completion < 25%.   GOAL:  Patient will meet greater than or equal to 90% of their needs  MONITOR:   Supplement acceptance, PO intake, Weight trends, I & O's, Skin, Labs  REASON FOR ASSESSMENT:   Consult Assessment of nutrition requirement/status  ASSESSMENT: Patient is a 79 yo male with hx of HF, Dementia, Emphysema, HTN, CVA (residual swallow impairment) and daily tobacco use. Presents from nursing home with shortness of breath. Chronic O2 @4  liters. COVID-19 pneumonia. Ambulates with wheelchair in facility.   RD working remotely  Talked with pt son [name] is POA. He is concerned that his dad doesn't have dentures with him. Reports pt appetite has been fair recently. Pt is able to feed himself at baseline. Documented intake 0% currently. Patient eating only bites. He is at risk for malnutrition.  Patient wt history shows stable the past 4 months between 82-84 kg and a  change compared to 6 months ago when he was weighing 89 kg. Patient does have a hx of HF.   Medications reviewed and include: Colace, Lasix, Solu-medrol, lopressor. Vitamin C-500 mg daily, Zinc 220 mg daily.   Intake/Output Summary (Last 24 hours) at December 14, 2019 0924 Last data filed at 2019/12/14 0600 Gross per 24 hour  Intake 1196.17 ml  Output 800 ml  Net 396.17 ml   Since admission: Fluid +1.964 liters  Labs: BMP Latest Ref Rng & Units December 14, 2019 11/19/2019 11/19/2019  Glucose 70 - 99 mg/dL 126(H) - 107(H)  BUN 8 - 23 mg/dL 34(H) - 27(H)  Creatinine 0.61 - 1.24 mg/dL 0.87 - 0.85  Sodium 135 - 145 mmol/L 139 140 140  Potassium 3.5 - 5.1 mmol/L 4.0 3.9 4.2  Chloride 98 - 111 mmol/L 102 - 99  CO2 22 - 32 mmol/L 29 - 27   Calcium 8.9 - 10.3 mg/dL 8.3(L) - 8.2(L)     Diet Order:   Diet Order            DIET DYS 2 Room service appropriate? Yes; Fluid consistency: Thin  Diet effective now              EDUCATION NEEDS:  Not appropriate for education at this time   Skin:  Skin Assessment: Reviewed RN Assessment(-MASD to scrotum and buttocks)  Last BM:  12/8  Height:   Ht Readings from Last 1 Encounters:  11/18/19 5\' 7"  (1.702 m)    Weight:   Wt Readings from Last 1 Encounters:  12/14/19 83.8 kg    Ideal Body Weight:  61 kg  BMI:  Body mass index is 28.94 kg/m.  Estimated Nutritional Needs:   Kcal:  [id number]  Protein:  109-118 gr  Fluid:  per MD goals  Colman Cater MS,RD,CSG,LDN Office: [phone] Pager: [phone]

## 2019-12-13 NOTE — Progress Notes (Signed)
Per Dr. Bonner Puna, okay to remove oxygen for comfort measures purposes. Patient receiving continuous morphine infusion.

## 2019-12-13 NOTE — Plan of Care (Signed)
  Problem: Education: Goal: Knowledge of risk factors and measures for prevention of condition will improve Outcome: Progressing   Problem: Coping: Goal: Psychosocial and spiritual needs will be supported Outcome: Progressing   Problem: Respiratory: Goal: Will maintain a patent airway Outcome: Progressing Goal: Complications related to the disease process, condition or treatment will be avoided or minimized Outcome: Progressing   Problem: Education: Goal: Knowledge of General Education information will improve Description: Including pain rating scale, medication(s)/side effects and non-pharmacologic comfort measures Outcome: Progressing   Problem: Health Behavior/Discharge Planning: Goal: Ability to manage health-related needs will improve Outcome: Progressing   Problem: Coping: Goal: Level of anxiety will decrease Outcome: Progressing   Problem: Safety: Goal: Ability to remain free from injury will improve Outcome: Progressing   Problem: Skin Integrity: Goal: Risk for impaired skin integrity will decrease Outcome: Progressing

## 2019-12-13 NOTE — Progress Notes (Signed)
Pt less alert today, more drowsy, MD paged on Amion. Pt had desaturation episode in 60s with good waveform. Now on HFNC 15L and NRB. Will continue to monitor.

## 2019-12-13 NOTE — Consult Note (Signed)
Consultation Note  Date: 12/20/19  Patient Name: Ricardo Erickson  DOB: 1941-05-01  MRN: [id number]  Age / Sex: 79 y.o., male  PCP: Place, Miquel Dunn Referring Physician: Patrecia Pour, MD  Reason for Consultation: Establishing goals of care and Terminal Care  HPI/Patient Profile: 79 y.o. male  with past medical history of COPD on home oxygen, CHF, paroxysmal atrial fibrillation, dementia, depression, PT/DVT, HTN, chronic back pain admitted on 11/21/2019 with shortness of breath and hypoxia. Found to be COVID positive on 11/14/2019. Hospital admission for COVID 19 pneumonia currently receiving steroids, remdesivir, antibiotics, and s/p plasma. DNR present at admission. On 12/9, patient with worsening hypoxia requiring HFNC 15L and 15L NRB mask. Poor prognosis. Palliative medicine consultation for goals of care/terminal care.   Clinical Assessment and Goals of Care:  I have reviewed medical records and discussed with Dr. Bonner Puna and RN. Patient with worsening hypoxia this afternoon on 15L HFNC requiring additional oxygen support with 15L NRB mask. Dr. Bonner Puna has spoken with son about critical condition and grim prognosis. RN arranged family visit for son and daughter.   Shortly after the visit, this NP spoke with son [name]) via telephone to discuss plan of care.   I introduced Palliative Medicine as specialized medical care for people living with serious illness. It focuses on providing relief from the symptoms and stress of a serious illness.   [name] shares that he has been his father's caregiver for the last 3 years. They have been seen by palliative care on previous admissions with multiple underlying chronic conditions. Prior to admit, patient was at [facility]. His father's health has been on the decline for multiple years.   [name] confirms his discussion with Dr. Bonner Puna and understanding of his father's critical  condition and poor prognosis. He speaks of the oxygen being the only intervention essentially keeping him alive at this point, for which I compassionately agreed with.   [name] shares that it was hard to visit his father in this condition, but that he recognized both [name] and his daughter. [name] shares that his father understands and has accepted he is nearing EOL. "He is ready to go" and "what happens when we get old."   [name] understands there is "no hope of getting better" and does not wish to prolong this process for his father. [name] is ready for full shift to comfort measures. Educated on shift to comfort measures including initiation of morphine infusion so his father does not struggle or suffer at the EOL. Educated on EOL expectations and focus on symptom management. [name] is agreeable with initiation of continuous morphine and weaning of oxygen when his father appears comfortable. [name] is prepared for 'anything to happen at any time' once oxygen is weaned. Likely hours-days prognosis.  Answered all questions. Emotional support provided.    SUMMARY OF RECOMMENDATIONS    Patient's children understand critical condition and poor prognosis. Son and daughter visited patient at bedside and ready for shift to comfort measures only. Explained that interventions not aimed at  comfort will be discontinued.   Symptom management--see below  When patient appears comfortable on morphine infusion, RN can start weaning oxygen. Discussed with RN.   Comfort feeds per patient request.  Anticipate hospital death. Family prepared for hours-days prognosis.  Code Status/Advance Care Planning:  DNR  Symptom Management:   RN to initiate continuous morphine infusion  RN may bolus morphine via infusion 2-4mg  IV q17min prn pain/dyspnea/air hunger/tachypnea  Robinul 0.2mg  IV q4h prn secretions  Haldol 2mg  IV q6h prn agitation/anxiety  Palliative Prophylaxis:   Delirium Protocol, Frequent Pain Assessment,  Oral Care and Turn Reposition  Additional Recommendations (Limitations, Scope, Preferences):  Full Comfort Care  Psycho-social/Spiritual:   Desire for further Chaplaincy support: yes  Additional Recommendations: Caregiving  Support/Resources and Compassionate Wean Education  Prognosis:   Hours - Days  Discharge Planning: Anticipated Hospital Death      Primary Diagnoses: Present on Admission: . Pneumonia due to COVID-19 virus . Pulmonary emboli ([place]) . DNR (do not resuscitate)   I have reviewed the medical record, interviewed the patient and family, and examined the patient. The following aspects are pertinent.  Past Medical History:  Diagnosis Date  . CHF (congestive heart failure) ([place])   . Chronic back pain   . Dementia ([place])   . Depression   . Emphysema lung ([place])   . Hypertension   . Personal history of tobacco use, presenting hazards to health 08/06/2015  . Pollen allergies   . PTSD (post-traumatic stress disorder)   . Pulmonary embolism [facility])    Social History   Socioeconomic History  . Marital status: Widowed    Spouse name: Not on file  . Number of children: Not on file  . Years of education: Not on file  . Highest education level: Not on file  Occupational History  . Not on file  Social Needs  . Financial resource strain: Not on file  . Food insecurity    Worry: Not on file    Inability: Not on file  . Transportation needs    Medical: Not on file    Non-medical: Not on file  Tobacco Use  . Smoking status: Current Every Day Smoker    Packs/day: 0.50    Years: 60.00    Pack years: 30.00    Types: Cigarettes  . Smokeless tobacco: Never Used  Substance and Sexual Activity  . Alcohol use: No    Alcohol/week: 0.0 standard drinks  . Drug use: No  . Sexual activity: Not Currently  Lifestyle  . Physical activity    Days per week: Not on file    Minutes per session: Not on file  . Stress: Not on file  Relationships  . Social Product manager on phone: Not on file    Gets together: Not on file    Attends religious service: Not on file    Active member of club or organization: Not on file    Attends meetings of clubs or organizations: Not on file    Relationship status: Not on file  Other Topics Concern  . Not on file  Social History Narrative   Widower.   2 children, 3 grandchildren, 1 great grandchild.   Retired. Now works as a Gaffer.   Enjoys relaxing, spending time with family, watching basketball.   Family History  Problem Relation Age of Onset  . Alcohol abuse Father   . Arthritis Mother   . Heart disease Maternal Grandmother   . Prostate cancer Brother  Scheduled Meds: . sodium chloride   Intravenous Once  . docusate sodium  100 mg Oral BID  . doxycycline  100 mg Oral Q12H  . feeding supplement (PRO-STAT SUGAR FREE 64)  30 mL Oral TID  . finasteride  5 mg Oral Daily  . FLUoxetine  10 mg Oral Daily  . guaiFENesin  600 mg Oral BID  . levalbuterol  2 puff Inhalation Q6H  . loratadine  10 mg Oral Daily  . mouth rinse  15 mL Mouth Rinse BID  . methylPREDNISolone (SOLU-MEDROL) injection  40 mg Intravenous Q12H  . metoprolol tartrate  12.5 mg Oral BID  . mometasone-formoterol  2 puff Inhalation BID  . pramipexole  0.5 mg Oral QHS  . tamsulosin  0.8 mg Oral QPC supper  . umeclidinium bromide  1 puff Inhalation Daily  . vitamin C  500 mg Oral Daily  . zinc sulfate  220 mg Oral Daily   Continuous Infusions: . amiodarone 30 mg/hr (12/03/19 0033)  . cefTRIAXone (ROCEPHIN)  IV 2 g (12/03/2019 1143)  . remdesivir 100 mg in NS 100 mL 100 mg (12/03/19 0816)   PRN Meds:.acetaminophen, chlorpheniramine-HYDROcodone, guaiFENesin-dextromethorphan, ipratropium, LORazepam, metoprolol tartrate, morphine injection, phenol Medications Prior to Admission:  Prior to Admission medications   Medication Sig Start Date End Date Taking? Authorizing Provider  acetaminophen (TYLENOL) 650 MG CR tablet Take 650 mg by mouth  every 8 (eight) hours as needed for pain.   Yes [provider]  ADVAIR DISKUS 250-50 MCG/DOSE AEPB Inhale 1 puff into the lungs every 12 (twelve) hours. 04/09/18  Yes [provider]  albuterol (PROVENTIL HFA;VENTOLIN HFA) 108 (90 Base) MCG/ACT inhaler Inhale 2 puffs into the lungs every 4 (four) hours as needed for wheezing or shortness of breath.    Yes [provider]  albuterol (PROVENTIL) (2.5 MG/3ML) 0.083% nebulizer solution Inhale 2.5 mg into the lungs 4 (four) times daily.  01/15/15  Yes [provider]  amiodarone (PACERONE) 200 MG tablet Two tabs po daily for three more days then take one tablet daily afterwards Patient taking differently: Take 200 mg by mouth daily.  02/23/19  Yes Wieting, Richard, MD  apixaban (ELIQUIS) 5 MG TABS tablet Take 1 tablet (5 mg total) by mouth 2 (two) times daily. 11/28/17  Yes Gladstone Lighter, MD  docusate sodium (COLACE) 100 MG capsule Take 1 capsule (100 mg total) by mouth 2 (two) times daily. 02/23/19 02/23/20 Yes Wieting, Richard, MD  finasteride (PROSCAR) 5 MG tablet Take 1 tablet (5 mg total) by mouth daily. 01/21/19  Yes Festus Aloe, MD  FLUoxetine (PROZAC) 10 MG capsule Take 10 mg by mouth daily.   Yes [provider]  furosemide (LASIX) 20 MG tablet Take 20 mg by mouth every morning.  06/29/18  Yes [provider]  guaiFENesin (MUCINEX) 600 MG 12 hr tablet Take 600 mg by mouth 2 (two) times daily.   Yes [provider]  Lidocaine-Menthol (ICY HOT LIDOCAINE PLUS MENTHOL) 4-1 % PTCH Apply 1 patch topically daily.   Yes [provider]  lisinopril (ZESTRIL) 5 MG tablet Take 1 tablet (5 mg total) by mouth daily. 07/23/19 12/12/2019 Yes Sainani, Belia Heman, MD  loratadine (CLARITIN) 10 MG tablet Take 10 mg by mouth daily.   Yes [provider]  OLANZapine (ZYPREXA) 5 MG tablet Take 5 mg by mouth at bedtime.   Yes [provider]  potassium chloride (K-DUR) 10 MEQ tablet  Take 10 mEq by mouth daily.   Yes  [provider]  pramipexole (MIRAPEX) 1 MG tablet Take 1 tablet (1 mg total) by mouth at bedtime. Patient taking differently: Take 0.5 mg by mouth at bedtime.  11/22/17  Yes Gladstone Lighter, MD  SPIRIVA HANDIHALER 18 MCG inhalation capsule Place 18 mcg into inhaler and inhale daily. 06/11/18  Yes [provider]  tamsulosin (FLOMAX) 0.4 MG CAPS capsule Take 2 capsules (0.8 mg total) by mouth daily after supper. 02/02/18  Yes Festus Aloe, MD  lidocaine (LIDODERM) 5 % Place 1 patch onto the skin daily. Remove & Discard patch within 12 hours or as directed by MD Patient not taking: Reported on 11/30/2019 11/28/18   Gladstone Lighter, MD  memantine (NAMENDA) 5 MG tablet Take 1 tablet (5 mg total) by mouth 2 (two) times daily. Patient not taking: Reported on 11/27/2019 02/23/19   Loletha Grayer, MD  risperiDONE (RISPERDAL) 0.25 MG tablet Take 1 tablet (0.25 mg total) by mouth at bedtime. Patient not taking: Reported on 12/03/2019 07/22/19 08/21/19  Henreitta Leber, MD   Allergies  Allergen Reactions  . Ativan [Lorazepam] Other (See Comments)    Patient gets severe altered mental status and confusion when taking this medicine   Review of Systems  Physical Exam  Vital Signs: BP 99/63 (BP Location: Right Arm)   Pulse 99   Temp 98 F (36.7 C) (Axillary)   Resp 17   Ht 5\' 7"  (1.702 m)   Wt 83.8 kg   SpO2 (!) 88%   BMI 28.94 kg/m  Pain Scale: 0-10   Pain Score: Asleep   SpO2: SpO2: (!) 88 % O2 Device:SpO2: (!) 88 % O2 Flow Rate: .O2 Flow Rate (L/min): 15 L/min  IO: Intake/output summary:   Intake/Output Summary (Last 24 hours) at 12/18/2019 1445 Last data filed at 12-18-19 0600 Gross per 24 hour  Intake 1196.17 ml  Output 800 ml  Net 396.17 ml    LBM: Last BM Date: 11/19/19 Baseline Weight: Weight: 84.2 kg Most recent weight: Weight: 83.8 kg     Palliative Assessment/Data: PPS 20%     Time In: 1520 Time Out: 1600  Time Total: 40 Greater than 50%  of this time was spent counseling and coordinating care related to the above assessment and plan.  The above conversation was completed via telephone due to visitor restrictions during COVID-19 pandemic. Thorough chart review and discussion with multidisciplinary team was completed as part of assessment. No physical examination was performed.    Signed by:  Ihor Dow, DNP, FNP-C Palliative Medicine Team  Phone: [phone] Fax: [phone]   Please contact Palliative Medicine Team phone at [phone] for questions and concerns.  For individual provider: See Shea Evans

## 2019-12-13 NOTE — Progress Notes (Addendum)
Progress note:   Called by RN regarding worsening lethargy and rising oxygen requirements.   BP 99/63 (BP Location: Right Arm)   Pulse 99   Temp 98 F (36.7 C) (Axillary)   Resp 17   Ht 5\' 7"  (1.702 m)   Wt 83.8 kg   SpO2 (!) 88%   BMI 28.94 kg/m   Gen: Elderly male in mild distress Pulm: Labored tachypnea on 15L HFNC. Having taken off NRB, SpO2 is remaining in mid-70%'s. Crackles diffusely. No wheezes. CV: Irreg w/rate ~100-110bpm. No JVD or dependent edema. Neuro: Alert, oriented to person, place ("the old [facility]") but not time or situation (unaware of what covid is or why he's having trouble breathing). No apparent focal neurological deficits.  DNR present at admission. Discussed goals of care in detail with both the patient and his Son. The patient's worsening hypoxia now requiring 15L HFNC and 15L NRB is worrisome in the setting of rising inflammatory markers, changes in mental status, and hemodynamic instability. All of this despite maximizing therapy for covid-19 pneumonia with remdesivir, steroids, EUA use of convalescent plasma, and off-label administration of tocilizumab. Antibiotics have also been provided. Diuresis is limited by hypotension. Still the patient's respiratory status worsens. He is not a candidate for intubation as this would not change the patient's grim prognosis, and repeatedly insists he "hates" having such high flow of oxygen. While he has a history of dementia, he confides that he believes there "are worse things than death."  - For now, we will allow family visitation with the possibility of end-of-life care nearing for this patient.  - We will provide maximal supplemental oxygen support and continue all treatments as above. If, at any point, the patient no longer tolerates high flow, we will titrate down on oxygen to the point that he is more comfortable and treat dyspnea/air hunger with morphine.  Total time spent with the patient and coordinating  care is 50 minutes.  Vance Gather, MD 11/23/19 3:04 PM

## 2019-12-13 NOTE — Progress Notes (Signed)
PMT consult received and chart reviewed. Discussed with Dr. Bonner Puna and RN. Spoke with son, [name] via telephone after him and his sister visited with patient at bedside. Children understand their father's critical condition and poor prognosis. [name] shares that his father understands and has accepted he is nearing EOL. "He is ready to go." [name] does not wish to prolong this for his father and is ready for shift to comfort measures only, understanding poor prognosis. Educated on shift to comfort to ensure comfort, peace, dignity at EOL. [name] agreeable with initiation of continuous morphine infusion and weaning of oxygen when his father appears comfortable. Comfort meds on MAR. Comfort feeds per patient request. Discussed again with Dr. Bonner Puna and RN.   Full palliative note to follow.   NO CHARGE  Ihor Dow, [place], FNP-C Palliative Medicine Team  Phone: [phone] Fax: [phone]

## 2019-12-13 DEATH — deceased
